# Patient Record
Sex: Female | Born: 1985 | Race: White | Hispanic: No | Marital: Married | Smoking: Former smoker
Health system: Southern US, Community
[De-identification: ages and names within clinical notes are randomized; demographics above are authoritative.]

## PROBLEM LIST (undated history)

## (undated) ENCOUNTER — Inpatient Hospital Stay (HOSPITAL_COMMUNITY): Payer: BLUE CROSS/BLUE SHIELD

## (undated) DIAGNOSIS — Z5189 Encounter for other specified aftercare: Secondary | ICD-10-CM

## (undated) DIAGNOSIS — R112 Nausea with vomiting, unspecified: Secondary | ICD-10-CM

## (undated) DIAGNOSIS — E611 Iron deficiency: Secondary | ICD-10-CM

## (undated) DIAGNOSIS — D509 Iron deficiency anemia, unspecified: Secondary | ICD-10-CM

## (undated) DIAGNOSIS — O09299 Supervision of pregnancy with other poor reproductive or obstetric history, unspecified trimester: Secondary | ICD-10-CM

## (undated) DIAGNOSIS — E039 Hypothyroidism, unspecified: Secondary | ICD-10-CM

## (undated) DIAGNOSIS — B789 Strongyloidiasis, unspecified: Secondary | ICD-10-CM

## (undated) DIAGNOSIS — I4892 Unspecified atrial flutter: Secondary | ICD-10-CM

## (undated) DIAGNOSIS — F419 Anxiety disorder, unspecified: Secondary | ICD-10-CM

## (undated) DIAGNOSIS — R519 Headache, unspecified: Secondary | ICD-10-CM

## (undated) DIAGNOSIS — E669 Obesity, unspecified: Secondary | ICD-10-CM

## (undated) DIAGNOSIS — O24419 Gestational diabetes mellitus in pregnancy, unspecified control: Secondary | ICD-10-CM

## (undated) DIAGNOSIS — E538 Deficiency of other specified B group vitamins: Secondary | ICD-10-CM

## (undated) DIAGNOSIS — I499 Cardiac arrhythmia, unspecified: Secondary | ICD-10-CM

## (undated) DIAGNOSIS — Z9889 Other specified postprocedural states: Secondary | ICD-10-CM

## (undated) HISTORY — DX: Deficiency of other specified B group vitamins: E53.8

## (undated) HISTORY — DX: Iron deficiency: E61.1

## (undated) HISTORY — DX: Obesity, unspecified: E66.9

## (undated) HISTORY — DX: Strongyloidiasis, unspecified: B78.9

## (undated) HISTORY — DX: Iron deficiency anemia, unspecified: D50.9

## (undated) HISTORY — DX: Unspecified atrial flutter: I48.92

## (undated) HISTORY — PX: CARDIAC ELECTROPHYSIOLOGY MAPPING AND ABLATION: SHX1292

## (undated) HISTORY — PX: OTHER SURGICAL HISTORY: SHX169

## (undated) HISTORY — DX: Anxiety disorder, unspecified: F41.9

---
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
## 2004-01-04 ENCOUNTER — Ambulatory Visit (HOSPITAL_COMMUNITY): Admission: RE | Admit: 2004-01-04 | Discharge: 2004-01-05 | Payer: Self-pay | Admitting: Internal Medicine

## 2004-01-13 ENCOUNTER — Ambulatory Visit: Admission: RE | Admit: 2004-01-13 | Discharge: 2004-01-13 | Payer: Self-pay | Admitting: Internal Medicine

## 2004-03-14 ENCOUNTER — Ambulatory Visit (HOSPITAL_COMMUNITY): Admission: RE | Admit: 2004-03-14 | Discharge: 2004-03-14 | Payer: Self-pay | Admitting: Family Medicine

## 2004-03-28 ENCOUNTER — Emergency Department (HOSPITAL_COMMUNITY): Admission: EM | Admit: 2004-03-28 | Discharge: 2004-03-28 | Payer: Self-pay | Admitting: Emergency Medicine

## 2004-08-07 ENCOUNTER — Ambulatory Visit (HOSPITAL_COMMUNITY): Admission: RE | Admit: 2004-08-07 | Discharge: 2004-08-07 | Payer: Self-pay | Admitting: Family Medicine

## 2004-08-20 ENCOUNTER — Ambulatory Visit: Payer: Self-pay | Admitting: Psychiatry

## 2004-09-03 ENCOUNTER — Emergency Department (HOSPITAL_COMMUNITY): Admission: EM | Admit: 2004-09-03 | Discharge: 2004-09-03 | Payer: Self-pay | Admitting: Emergency Medicine

## 2004-09-04 ENCOUNTER — Ambulatory Visit: Payer: Self-pay | Admitting: Family Medicine

## 2004-10-06 ENCOUNTER — Ambulatory Visit: Payer: Self-pay | Admitting: Family Medicine

## 2004-10-10 ENCOUNTER — Emergency Department (HOSPITAL_COMMUNITY): Admission: EM | Admit: 2004-10-10 | Discharge: 2004-10-10 | Payer: Self-pay | Admitting: Emergency Medicine

## 2004-10-15 ENCOUNTER — Emergency Department (HOSPITAL_COMMUNITY): Admission: EM | Admit: 2004-10-15 | Discharge: 2004-10-15 | Payer: Self-pay | Admitting: Emergency Medicine

## 2004-10-24 ENCOUNTER — Ambulatory Visit: Payer: Self-pay | Admitting: Family Medicine

## 2004-10-31 ENCOUNTER — Ambulatory Visit: Payer: Self-pay | Admitting: Family Medicine

## 2004-11-02 HISTORY — PX: GASTRIC BYPASS: SHX52

## 2004-11-05 ENCOUNTER — Ambulatory Visit: Payer: Self-pay | Admitting: Family Medicine

## 2004-11-17 ENCOUNTER — Ambulatory Visit: Payer: Self-pay | Admitting: Family Medicine

## 2004-12-04 ENCOUNTER — Ambulatory Visit: Payer: Self-pay | Admitting: Orthopedic Surgery

## 2005-01-14 ENCOUNTER — Ambulatory Visit: Payer: Self-pay | Admitting: Family Medicine

## 2005-01-21 ENCOUNTER — Emergency Department (HOSPITAL_COMMUNITY): Admission: EM | Admit: 2005-01-21 | Discharge: 2005-01-21 | Payer: Self-pay | Admitting: Emergency Medicine

## 2005-01-22 ENCOUNTER — Ambulatory Visit: Payer: Self-pay | Admitting: Family Medicine

## 2005-01-30 ENCOUNTER — Ambulatory Visit (HOSPITAL_COMMUNITY): Admission: RE | Admit: 2005-01-30 | Discharge: 2005-01-30 | Payer: Self-pay | Admitting: Family Medicine

## 2005-02-03 ENCOUNTER — Ambulatory Visit (HOSPITAL_COMMUNITY): Admission: RE | Admit: 2005-02-03 | Discharge: 2005-02-03 | Payer: Self-pay | Admitting: Family Medicine

## 2005-02-12 ENCOUNTER — Ambulatory Visit: Payer: Self-pay | Admitting: Family Medicine

## 2005-02-17 ENCOUNTER — Ambulatory Visit: Payer: Self-pay | Admitting: *Deleted

## 2005-02-18 ENCOUNTER — Emergency Department (HOSPITAL_COMMUNITY): Admission: EM | Admit: 2005-02-18 | Discharge: 2005-02-18 | Payer: Self-pay | Admitting: Emergency Medicine

## 2005-02-19 ENCOUNTER — Ambulatory Visit: Payer: Self-pay | Admitting: Internal Medicine

## 2005-02-19 ENCOUNTER — Ambulatory Visit (HOSPITAL_COMMUNITY): Admission: RE | Admit: 2005-02-19 | Discharge: 2005-02-19 | Payer: Self-pay

## 2005-02-25 ENCOUNTER — Ambulatory Visit (HOSPITAL_COMMUNITY): Admission: RE | Admit: 2005-02-25 | Discharge: 2005-02-25 | Payer: Self-pay | Admitting: Internal Medicine

## 2005-02-27 ENCOUNTER — Ambulatory Visit (HOSPITAL_COMMUNITY): Admission: RE | Admit: 2005-02-27 | Discharge: 2005-02-27 | Payer: Self-pay | Admitting: Internal Medicine

## 2005-03-04 ENCOUNTER — Ambulatory Visit: Payer: Self-pay | Admitting: Family Medicine

## 2005-03-05 ENCOUNTER — Encounter: Payer: Self-pay | Admitting: Family Medicine

## 2005-03-05 LAB — CONVERTED CEMR LAB: Pap Smear: NORMAL

## 2005-03-10 ENCOUNTER — Ambulatory Visit: Admission: RE | Admit: 2005-03-10 | Discharge: 2005-03-10 | Payer: Self-pay | Admitting: Family Medicine

## 2005-03-18 ENCOUNTER — Ambulatory Visit: Payer: Self-pay | Admitting: Family Medicine

## 2005-03-20 ENCOUNTER — Ambulatory Visit: Payer: Self-pay | Admitting: Pulmonary Disease

## 2005-03-31 ENCOUNTER — Ambulatory Visit: Payer: Self-pay | Admitting: Internal Medicine

## 2005-04-15 ENCOUNTER — Ambulatory Visit: Payer: Self-pay | Admitting: Internal Medicine

## 2005-04-27 ENCOUNTER — Ambulatory Visit (HOSPITAL_COMMUNITY): Admission: RE | Admit: 2005-04-27 | Discharge: 2005-04-27 | Payer: Self-pay | Admitting: Internal Medicine

## 2005-04-29 ENCOUNTER — Ambulatory Visit: Payer: Self-pay | Admitting: Family Medicine

## 2005-05-25 ENCOUNTER — Ambulatory Visit: Payer: Self-pay | Admitting: Family Medicine

## 2005-07-03 ENCOUNTER — Ambulatory Visit: Payer: Self-pay | Admitting: Family Medicine

## 2005-08-04 ENCOUNTER — Ambulatory Visit: Payer: Self-pay | Admitting: Family Medicine

## 2005-08-14 ENCOUNTER — Ambulatory Visit: Payer: Self-pay | Admitting: Family Medicine

## 2005-10-20 ENCOUNTER — Ambulatory Visit: Payer: Self-pay | Admitting: Family Medicine

## 2005-10-30 ENCOUNTER — Ambulatory Visit: Payer: Self-pay | Admitting: Family Medicine

## 2005-10-30 ENCOUNTER — Emergency Department (HOSPITAL_COMMUNITY): Admission: EM | Admit: 2005-10-30 | Discharge: 2005-10-30 | Payer: Self-pay | Admitting: Emergency Medicine

## 2005-11-06 ENCOUNTER — Encounter (HOSPITAL_COMMUNITY): Admission: RE | Admit: 2005-11-06 | Discharge: 2005-12-06 | Payer: Self-pay | Admitting: Family Medicine

## 2005-11-06 ENCOUNTER — Ambulatory Visit (HOSPITAL_COMMUNITY): Payer: Self-pay | Admitting: Family Medicine

## 2005-11-06 ENCOUNTER — Ambulatory Visit: Payer: Self-pay | Admitting: Family Medicine

## 2005-11-09 ENCOUNTER — Ambulatory Visit: Payer: Self-pay | Admitting: Family Medicine

## 2005-12-21 ENCOUNTER — Ambulatory Visit: Payer: Self-pay | Admitting: Family Medicine

## 2006-05-21 ENCOUNTER — Ambulatory Visit: Payer: Self-pay | Admitting: Family Medicine

## 2006-05-21 ENCOUNTER — Ambulatory Visit (HOSPITAL_COMMUNITY): Admission: RE | Admit: 2006-05-21 | Discharge: 2006-05-21 | Payer: Self-pay | Admitting: Family Medicine

## 2006-06-26 ENCOUNTER — Ambulatory Visit: Payer: Self-pay | Admitting: Cardiology

## 2006-06-26 ENCOUNTER — Emergency Department (HOSPITAL_COMMUNITY): Admission: EM | Admit: 2006-06-26 | Discharge: 2006-06-26 | Payer: Self-pay | Admitting: Emergency Medicine

## 2006-10-07 ENCOUNTER — Ambulatory Visit: Payer: Self-pay | Admitting: Family Medicine

## 2006-12-08 ENCOUNTER — Ambulatory Visit: Payer: Self-pay | Admitting: Family Medicine

## 2006-12-13 ENCOUNTER — Ambulatory Visit: Payer: Self-pay | Admitting: Family Medicine

## 2006-12-16 ENCOUNTER — Ambulatory Visit (HOSPITAL_COMMUNITY): Admission: RE | Admit: 2006-12-16 | Discharge: 2006-12-16 | Payer: Self-pay | Admitting: Family Medicine

## 2006-12-17 ENCOUNTER — Encounter: Payer: Self-pay | Admitting: Family Medicine

## 2006-12-17 LAB — CONVERTED CEMR LAB
Bilirubin Urine: NEGATIVE
Hemoglobin, Urine: NEGATIVE
Ketones, ur: NEGATIVE mg/dL
Leukocytes, UA: NEGATIVE
Nitrite: NEGATIVE
Protein, ur: NEGATIVE mg/dL
Specific Gravity, Urine: 1.015 (ref 1.005–1.03)
Urine Glucose: NEGATIVE mg/dL
Urobilinogen, UA: 0.2 (ref 0.0–1.0)
pH: 7.5 (ref 5.0–8.0)

## 2007-04-08 ENCOUNTER — Ambulatory Visit: Payer: Self-pay | Admitting: Family Medicine

## 2007-11-03 ENCOUNTER — Encounter: Payer: Self-pay | Admitting: Family Medicine

## 2008-01-09 ENCOUNTER — Encounter: Payer: Self-pay | Admitting: Family Medicine

## 2008-01-09 DIAGNOSIS — F411 Generalized anxiety disorder: Secondary | ICD-10-CM | POA: Insufficient documentation

## 2008-01-09 DIAGNOSIS — J45909 Unspecified asthma, uncomplicated: Secondary | ICD-10-CM | POA: Insufficient documentation

## 2008-01-09 DIAGNOSIS — J4520 Mild intermittent asthma, uncomplicated: Secondary | ICD-10-CM | POA: Insufficient documentation

## 2008-01-12 ENCOUNTER — Ambulatory Visit: Payer: Self-pay | Admitting: Family Medicine

## 2008-01-19 ENCOUNTER — Encounter: Payer: Self-pay | Admitting: Family Medicine

## 2008-01-19 LAB — CONVERTED CEMR LAB: Retic Ct Pct: 0.9 % (ref 0.4–3.1)

## 2008-01-23 ENCOUNTER — Ambulatory Visit: Payer: Self-pay | Admitting: Family Medicine

## 2008-02-15 ENCOUNTER — Ambulatory Visit: Payer: Self-pay | Admitting: Family Medicine

## 2008-04-13 ENCOUNTER — Encounter (HOSPITAL_COMMUNITY): Admission: RE | Admit: 2008-04-13 | Discharge: 2008-05-13 | Payer: Self-pay | Admitting: Oncology

## 2008-04-13 ENCOUNTER — Ambulatory Visit (HOSPITAL_COMMUNITY): Payer: Self-pay | Admitting: Oncology

## 2008-05-01 ENCOUNTER — Encounter: Payer: Self-pay | Admitting: Family Medicine

## 2008-05-01 ENCOUNTER — Ambulatory Visit: Payer: Self-pay | Admitting: Internal Medicine

## 2008-05-01 ENCOUNTER — Ambulatory Visit: Payer: Self-pay

## 2008-06-18 ENCOUNTER — Encounter (HOSPITAL_COMMUNITY): Admission: RE | Admit: 2008-06-18 | Discharge: 2008-07-18 | Payer: Self-pay | Admitting: Oncology

## 2008-06-19 ENCOUNTER — Ambulatory Visit (HOSPITAL_COMMUNITY): Payer: Self-pay | Admitting: Oncology

## 2008-06-21 ENCOUNTER — Telehealth: Payer: Self-pay | Admitting: Family Medicine

## 2008-06-25 ENCOUNTER — Ambulatory Visit: Payer: Self-pay | Admitting: Family Medicine

## 2008-06-27 ENCOUNTER — Ambulatory Visit: Payer: Self-pay | Admitting: Internal Medicine

## 2008-06-27 ENCOUNTER — Encounter: Payer: Self-pay | Admitting: Family Medicine

## 2008-07-05 ENCOUNTER — Telehealth: Payer: Self-pay | Admitting: Family Medicine

## 2008-07-05 ENCOUNTER — Encounter: Payer: Self-pay | Admitting: Family Medicine

## 2008-08-08 ENCOUNTER — Encounter: Payer: Self-pay | Admitting: Family Medicine

## 2008-08-08 ENCOUNTER — Ambulatory Visit (HOSPITAL_COMMUNITY): Payer: Self-pay | Admitting: Oncology

## 2008-08-15 ENCOUNTER — Telehealth: Payer: Self-pay | Admitting: Family Medicine

## 2008-10-30 ENCOUNTER — Ambulatory Visit (HOSPITAL_COMMUNITY): Payer: Self-pay | Admitting: Oncology

## 2008-10-30 ENCOUNTER — Encounter (HOSPITAL_COMMUNITY): Admission: RE | Admit: 2008-10-30 | Discharge: 2008-11-29 | Payer: Self-pay | Admitting: Oncology

## 2009-04-29 ENCOUNTER — Encounter (HOSPITAL_COMMUNITY): Admission: RE | Admit: 2009-04-29 | Discharge: 2009-05-29 | Payer: Self-pay | Admitting: Oncology

## 2009-04-29 ENCOUNTER — Ambulatory Visit (HOSPITAL_COMMUNITY): Payer: Self-pay | Admitting: Oncology

## 2009-07-09 ENCOUNTER — Ambulatory Visit: Payer: Self-pay | Admitting: Family Medicine

## 2009-07-12 ENCOUNTER — Ambulatory Visit: Payer: Self-pay | Admitting: Family Medicine

## 2009-07-12 DIAGNOSIS — F3189 Other bipolar disorder: Secondary | ICD-10-CM | POA: Insufficient documentation

## 2009-07-15 ENCOUNTER — Encounter: Payer: Self-pay | Admitting: Family Medicine

## 2009-07-15 DIAGNOSIS — R002 Palpitations: Secondary | ICD-10-CM | POA: Insufficient documentation

## 2009-07-15 DIAGNOSIS — G44009 Cluster headache syndrome, unspecified, not intractable: Secondary | ICD-10-CM | POA: Insufficient documentation

## 2009-08-26 DIAGNOSIS — I4892 Unspecified atrial flutter: Secondary | ICD-10-CM | POA: Insufficient documentation

## 2009-09-03 ENCOUNTER — Encounter: Payer: Self-pay | Admitting: Family Medicine

## 2009-09-10 ENCOUNTER — Encounter (INDEPENDENT_AMBULATORY_CARE_PROVIDER_SITE_OTHER): Payer: Self-pay | Admitting: *Deleted

## 2009-10-30 ENCOUNTER — Encounter (HOSPITAL_COMMUNITY)
Admission: RE | Admit: 2009-10-30 | Discharge: 2009-11-29 | Payer: Self-pay | Source: Home / Self Care | Admitting: Oncology

## 2009-10-30 ENCOUNTER — Ambulatory Visit (HOSPITAL_COMMUNITY): Payer: Self-pay | Admitting: Oncology

## 2010-07-25 ENCOUNTER — Encounter: Payer: Self-pay | Admitting: Family Medicine

## 2010-10-02 ENCOUNTER — Ambulatory Visit: Payer: Self-pay | Admitting: Family Medicine

## 2010-10-02 DIAGNOSIS — D509 Iron deficiency anemia, unspecified: Secondary | ICD-10-CM

## 2010-10-02 DIAGNOSIS — M2550 Pain in unspecified joint: Secondary | ICD-10-CM | POA: Insufficient documentation

## 2010-10-02 DIAGNOSIS — H6690 Otitis media, unspecified, unspecified ear: Secondary | ICD-10-CM | POA: Insufficient documentation

## 2010-10-02 HISTORY — DX: Iron deficiency anemia, unspecified: D50.9

## 2010-10-03 ENCOUNTER — Telehealth (INDEPENDENT_AMBULATORY_CARE_PROVIDER_SITE_OTHER): Payer: Self-pay | Admitting: *Deleted

## 2010-10-03 ENCOUNTER — Encounter: Payer: Self-pay | Admitting: Family Medicine

## 2010-10-05 DIAGNOSIS — F17200 Nicotine dependence, unspecified, uncomplicated: Secondary | ICD-10-CM | POA: Insufficient documentation

## 2010-10-07 ENCOUNTER — Telehealth: Payer: Self-pay | Admitting: Family Medicine

## 2010-10-07 LAB — CONVERTED CEMR LAB: Retic Ct Pct: 0.7 % (ref 0.4–3.1)

## 2010-10-22 ENCOUNTER — Encounter: Payer: Self-pay | Admitting: Family Medicine

## 2010-10-28 ENCOUNTER — Encounter: Payer: Self-pay | Admitting: Internal Medicine

## 2010-10-28 ENCOUNTER — Encounter (HOSPITAL_COMMUNITY)
Admission: RE | Admit: 2010-10-28 | Discharge: 2010-11-27 | Payer: Self-pay | Source: Home / Self Care | Attending: Oncology | Admitting: Oncology

## 2010-10-28 ENCOUNTER — Ambulatory Visit (HOSPITAL_COMMUNITY): Payer: Self-pay | Admitting: Oncology

## 2010-10-28 ENCOUNTER — Encounter: Payer: Self-pay | Admitting: Family Medicine

## 2010-11-23 ENCOUNTER — Encounter: Payer: Self-pay | Admitting: Family Medicine

## 2010-12-02 NOTE — Progress Notes (Signed)
Summary: cough medicine  Phone Note Call from Patient   Summary of Call: she [facility] [address] [place]  phone # [phone] Initial call taken by: Lind Guest,  October 07, 2010 4:56 PM  Follow-up for Phone Call        tussionesx is printed, pls fax to the requested pharmacy and let her know Follow-up by: [name] MD,  October 08, 2010 5:07 PM  Additional Follow-up for Phone Call Additional follow up Details #1::        med telephoned in  Additional Follow-up by: Adella Hare LPN,  October 08, 2010 5:22 PM    New/Updated Medications: Sandria Senter ER 10-8 MG/5ML LQCR (HYDROCOD POLST-CHLORPHEN POLST) one teaspoon twice daily as needed for excessive cough Prescriptions: TUSSIONEX PENNKINETIC ER 10-8 MG/5ML LQCR (HYDROCOD POLST-CHLORPHEN POLST) one teaspoon twice daily as needed for excessive cough  #247ml x 0   Entered and Authorized by:   [name] MD   Signed by:   [name] MD on 10/08/2010   Method used:   Printed then faxed to ...       [facility] (retail)       [address]       [place], [place]  [address]       Ph: [phone]       Fax: [phone]   RxID:   [id number]

## 2010-12-02 NOTE — Progress Notes (Signed)
Summary: physical  Phone Note Outgoing Call   Summary of Call: pt needs complete physical and a ppd test as soon as possible. we have no appts for physical until end of oct. are either refer her to another obgyn. [phone] Initial call taken by: Rudene Anda,  June 21, 2008 3:22 PM  Follow-up for Phone Call        [facility] appt. for next week pls   pt was notified and has appt aug 25 for just ppd. She  had physical in april. Follow-up by: [name] MD,  June 22, 2008 5:59 AM  Additional Follow-up for Phone Call Additional follow up Details #1::        Pt has appt Additional Follow-up by: Rudene Anda,  June 22, 2008 3:13 PM

## 2010-12-02 NOTE — Progress Notes (Signed)
Summary: need to refer to Dr Lurene Shadow  Phone Note Outgoing Call   Call placed by: shannon  Call placed to: Patient Summary of Call: I called patient to see what dates might work for her to be scheduled with Dr. Lurene Shadow.  Awaiting her call before I can do referral. Initial call taken by: Curtis Sites,  October 03, 2010 9:51 AM  Follow-up for Phone Call        patient is going to see Dr. Kellie Simmering instead of dr. Lurene Shadow.  Patient has an appt. already. Follow-up by: Curtis Sites,  October 07, 2010 2:51 PM

## 2010-12-02 NOTE — Assessment & Plan Note (Signed)
Summary: TB  Nurse Visit  Comments patient in today for ppd placement   Allergies: 1)  ! Morphine 2)  ! Pcn 3)  ! Percocet  Immunizations Administered:  PPD Skin Test:    Vaccine Type: PPD    Site: left forearm    Mfr: Sanofi Pasteur    Dose: 0.1 ml    Route: ID    Given by: Worthy Keeler LPN    Exp. Date: 11/28/2011    Lot #: [id number]  Orders Added: 1)  TB Skin Test [86580] 2)  Admin 1st Vaccine [90471]  Appended Document: TB   PPD Results    Date of reading: 07/11/2009    Results: < 5mm    Interpretation: negative

## 2010-12-02 NOTE — Progress Notes (Signed)
Summary: NEEDS RX WRITTEN OUT  Phone Note From Other Clinic   Summary of Call: NEEDS HER RX WRITTEN FOR WHEN SHE GOES TO [place] LEAVING THIS SATURDAY MAXALT  10 MG TOPMAX 75 MG two times a day  ALSO PLEASE PUT REFILLS SHE WILL BE GONE [place]  CALL BACK [phone] Initial call taken by: Lind Guest,  August 15, 2008 11:19 AM Summary of Call: Rx written and dose to be verified by nursing, pt to collect. Initial call taken by: [name] MD,  August 15, 2008 3:10 PM  Follow-up for Phone Call        Phone call completed Follow-up by: Worthy Keeler LPN,  August 15, 2008 3:14 PM    New/Updated Medications: TOPAMAX 25 MG TABS (TOPIRAMATE) THREE TABS by mouth BID MAXALT 10 MG TABS (RIZATRIPTAN BENZOATE) UAD   Prescriptions: MAXALT 10 MG TABS (RIZATRIPTAN BENZOATE) UAD  #10 x 5   Entered by:   Worthy Keeler LPN   Authorized by:   [name] MD   Signed by:   Worthy Keeler LPN on 45/40/9811   Method used:   Handwritten   RxID:   [id number] TOPAMAX 25 MG TABS (TOPIRAMATE) THREE TABS by mouth BID  #180 x 5   Entered by:   Worthy Keeler LPN   Authorized by:   [name] MD   Signed by:   Worthy Keeler LPN on 78/46/9629   Method used:   Handwritten   RxID:   [id number]

## 2010-12-02 NOTE — Assessment & Plan Note (Signed)
Summary: ppd shot  Nurse Visit    Prior Medications: FUROSEMIDE 20 MG  TABS (FUROSEMIDE) one tab by mouth once daily FIORICET 50-325-40 MG  TABS (BUTALBITAL-APAP-CAFFEINE) one tab by mouth every 4 -6 hours as needed PROPRANOLOL HCL 20 MG  TABS (PROPRANOLOL HCL) one tab by mouth two times a day LEXAPRO 20 MG  TABS (ESCITALOPRAM OXALATE) two tabs by mouth once daily CLONAZEPAM 2 MG  TABS (CLONAZEPAM) 1/2  or one tab by mouth every 8 hours as needed RESTORIL 7.5 MG  CAPS (TEMAZEPAM) one tab by mouth at bedtime as needed Current Allergies: ! MORPHINE ! PCN ! PERCOCET   PPD Application    Vaccine Type: PPD    Site: left forearm    Mfr: Sanofi Pasteur    Dose: 0.1 ml    Route: ID    Given by: Anna Wall    Exp. Date: 09/21/2010    Lot #: [id number]  PPD Results    Date of reading: 06/28/2008    Results: < 5mm    Interpretation: negative   Orders Added: 1)  TB Skin Test Maurine.Goes    ]

## 2010-12-02 NOTE — Letter (Signed)
Summary: HEALTH STATEMENT RELEASE  HEALTH STATEMENT RELEASE   Imported By: Lind Guest 07/15/2009 15:01:27  _____________________________________________________________________  External Attachment:    Type:   Image     Comment:   External Document

## 2010-12-02 NOTE — Letter (Signed)
Summary: Appointment - Missed  [facility], Main Office  [address]   [place], [place] [address]   Phone: [phone]  Fax: [phone]        September 10, 2009 MRN: [id number]   [facility] HOLT [address] [place], [place]  [address]   Dear Ms. HOLT,  Our records indicate you missed your appointment on  August 28, 2009 with Dr. Ladona Ridgel.  It is very important that we reach you to reschedule this appointment. We look forward to participating in your health care needs.   Please contact us at the number listed above at your earliest convenience to reschedule this appointment.  Sincerely,   Burnard Leigh [facility] [employer] Scheduling Team

## 2010-12-02 NOTE — Assessment & Plan Note (Signed)
Summary: PHY   Vital Signs:  Patient profile:   25 year old female Menstrual status:  irregular Height:      61.5 inches Weight:      169 pounds BMI:     31.53 O2 Sat:      98 % Pulse rate:   91 / minute Pulse (ortho):   72 / minute Pulse rhythm:   regular Resp:     16 per minute BP sitting:   120 / 80  (right arm)  Vitals Entered By: Everitt Amber (July 12, 2009 10:30 AM)  Nutrition Counseling: Patient's BMI is greater than 25 and therefore counseled on weight management options. CC: CPE Is Patient Diabetic? No Pain Assessment Patient in pain? no       Vision Screening:Left eye w/o correction: 20 / 15 Right Eye w/o correction: 20 / 15 Both eyes w/o correction:  20/ 15  Color vision testing: normal      Vision Entered By: Everitt Amber (July 12, 2009 10:38 AM)  years   days  Menstrual Status irregular Last PAP Result Normal   CC:  CPE.  History of Present Illness: Pt comes in for f/i. She is working as a Tour manager and has noted a deterioration inher ability to function over the last 4 to 6 months since stoppingher medication for bipolar ds with which she was diagnosed approx 2 yrs ago. She wishes to resume this as wellas realising the need to re-establish with her psychiatrist. She denies excessibe cryting spells, burt has nbeen feeling somewhat depressed and down. She reports some irritability and feeling overwhelmed also. She has had no recent fever or chills. She denies any current head or chest congestion, but she did recentlyexperiencean episode of wheezing which was the first in 2 years. She has had no episodes of tachycardia, and denieschest discomfort, she does follow with her cardiologist annualy. She continues to require iV iron, and states that a hysterectomy was mentioned by her hematologist which she is not at all interested in at this time. she is bothered by an excess of skin where she had lost weight , but the cost of surgery is  prohibitive at this time.  Allergies: 1)  ! Morphine 2)  ! Pcn 3)  ! Percocet  Review of Systems      See HPI General:  Denies chills, fatigue, fever, sleep disorder, sweats, and weakness. Eyes:  Denies blurring and discharge. ENT:  Denies hoarseness, nasal congestion, sinus pressure, and sore throat. CV:  Saw Dr. Ladona Ridgel about 1 year ago wore a holter for 1 month told no new disease process, cotinue toprol and exercise. Resp:  Complains of wheezing; denies cough, pleuritic, shortness of breath, and sputum productive; 2 episodes ofwheezing about 3 weeks ago when in [place]. GI:  Denies abdominal pain, constipation, diarrhea, nausea, and vomiting. GU:  Complains of abnormal vaginal bleeding; heavy menses persist which are irreguklar, still needing iv iron has had 18 in the past yr, taking oTC supplement called "blood builder", three times a day, iron absorption is minimal, last saw Heme 2 months ago she is also on B121000 mcg monthly. MS:  Denies joint pain and stiffness. Derm:  c/o excessive skin hanging over reportedly approx 35 pounds, pt had gastric bypass andhas been told that the surgery to have thisreoved would cost approx $40K. Neuro:  Complains of headaches; constant daily and uncontroled migraines, eats Bc. last neurology 3 yrs ago. Psych:  Complains of easily angered and easily tearful; denies suicidal thoughts/plans  and thoughts of violence; Diagnosed formally, as bipolar disease approx 2 yrs ago, off med for 6 months slight decompensation. Endo:  Denies cold intolerance, excessive hunger, excessive thirst, excessive urination, and polyuria. Heme:  Complains of bleeding; denies abnormal bruising; heavy and irreg menses with persistent profound iron deficiency. Allergy:  Complains of seasonal allergies; denies hives or rash and itching eyes.  Physical Exam  General:  alert, well-nourished, and well-hydrated. HEENT: No facial asymmetry,  EOMI, No sinus tenderness, TM's Clear,  oropharynx  pink and moist.   Chest: Clear to auscultation bilaterally.  CVS: S1, S2, No murmurs, No S3.   Abd: Soft, Nontender.  MS: Adequate ROM spine, hips, shoulders and knees.  Ext: No edema.   CNS: CN 2-12 intact, power tone and sensation normal throughout.   Skin: Intact, no visible lesions or rashes.  Psych: Good eye contact, normal affect.  Memory intact, not anxious or depressed appearing.     Impression & Recommendations:  Problem # 1:  BIPOLAR II DISORDER (ICD-296.89) Assessment Deteriorated  resume lamictal 200mg  daily   Orders: Psychiatric Referral (Psych)  Problem # 2:  ASTHMA, UNSPECIFIED, UNSPECIFIED STATUS (ICD-493.90) Assessment: Deteriorated  Her updated medication list for this problem includes:    Proventil Hfa 108 (90 Base) Mcg/act Aers (Albuterol sulfate) .Marland Kitchen... 2 puffs every 6 to 8hours as needed for wheezing  Problem # 3:  OBESITY, UNSPECIFIED (ICD-278.00) Assessment: Unchanged  Orders: T-Lipid Profile ([id number])  Ht: 61.5 (07/12/2009)   Wt: 169 (07/12/2009)   BMI: 31.53 (07/12/2009)  Problem # 4:  PALPITATIONS (ICD-785.1) Assessment: Unchanged  The following medications were removed from the medication list:    Propranolol Hcl 20 Mg Tabs (Propranolol hcl) ..... One tab by mouth two times a day Her updated medication list for this problem includes:    Toprol Xl 25 Mg Xr24h-tab (Metoprolol succinate) .Marland Kitchen... 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 Take 1 tablet by mouth two times a day 2)  Lamotrigine 200 Mg Tabs (Lamotrigine) .... Take 1 tablet by mouth once a day 3)  Toprol Xl 25 Mg Xr24h-tab (Metoprolol succinate) .... Take 1 tablet by mouth  once a day 4)  Maxalt 10 Mg Tabs (Rizatriptan benzoate) .Marland Kitchen.. 1 tablet at headache onset, rept after 2 hrs if needed , up to 3 tablets in 24 hrs, max use twice weekly 5)  Proventil Hfa 108 (90 Base) Mcg/act Aers (Albuterol sulfate) .... 2 puffs every 6 to 8hours as needed for wheezing  Other Orders: T-Basic Metabolic Panel [id number]) T-CBC w/Diff [phone]) T-TSH [phone])  Patient Instructions: 1)  F/U in 5.5 months. 2)  It is important that you exercise regularly at least 20 minutes 5 times a week. If you develop chest pain, have severe difficulty breathing, or feel very tired , stop exercising immediately and seek medical attention. 3)  You need to lose weight. Consider a lower calorie diet and regular exercise.  4)  BMP prior to visit, ICD-9: 5)  Lipid Panel prior to visit, ICD-9:  fasting labs asap. 6)  TSH prior to visit, ICD-9: 7)  CBC w/ Diff prior to visit, ICD-9: 8)  pLs ensure that you keep appts with neurologist, psychiatrist, cardiologist, gynaecologist. Prescriptions: PROVENTIL HFA 108 (90  BASE) MCG/ACT AERS (ALBUTEROL SULFATE) 2 puffs every 6 to 8hours as needed for wheezing  #1 x 3   Entered and Authorized by:   [name] MD   Signed by:   [name] MD on 07/12/2009   Method used:   Electronically to        [facility] (retail)       [address]       [place], [place]  [address]       Ph: [phone]       Fax: [phone]   RxID:   [id number] MAXALT 10 MG TABS (RIZATRIPTAN BENZOATE) 1 tablet at headache onset, rept after 2 hrs if needed , up to 3 tablets in 24 hrs, max use twice weekly  #10 x 5   Entered and Authorized by:   [name] MD   Signed by:   [name] MD on 07/12/2009   Method used:   Electronically to        [facility] (retail)       [address]       [place], [place]  [address]       Ph: [phone]       Fax: [phone]    RxID:   [id number] TOPROL XL 25 MG XR24H-TAB (METOPROLOL SUCCINATE) Take 1 tablet by mouth once a day  #30 x 5   Entered and Authorized by:   [name] MD   Signed by:   [name] MD on 07/12/2009   Method used:   Electronically to        [facility] (retail)       [address]       [place], [place]  [address]       Ph: [phone]       Fax: [phone]   RxID:   [id number] LAMOTRIGINE 200 MG TABS (LAMOTRIGINE) Take 1 tablet by mouth once a day  #30 x 5   Entered and Authorized by:   [name] MD   Signed by:   [name] MD on 07/12/2009   Method used:   Electronically to        [facility] (retail)       [address]       [place], [place]  [address]       Ph: [phone]       Fax: [phone]   RxID:   [id number] TOPAMAX 100 MG TABS (TOPIRAMATE) Take 1 tablet by mouth two times a day  #60 x 5   Entered and Authorized by:   [name] MD   Signed by:   [name] MD on 07/12/2009   Method used:   Electronically to        [facility] (retail)       [address]       [place], [place]  [address]       Ph: [phone]       Fax: [phone]   RxID:   [id number]

## 2010-12-02 NOTE — Letter (Signed)
Summary: external records  external records   Imported By: Curtis Sites 06/24/2010 08:37:48  _____________________________________________________________________  External Attachment:    Type:   Image     Comment:   External Document

## 2010-12-02 NOTE — Letter (Signed)
Summary: RELEASE OF MEDICAL INFO  RELEASE OF MEDICAL INFO   Imported By: Lind Guest 07/25/2010 09:42:13  _____________________________________________________________________  External Attachment:    Type:   Image     Comment:   External Document

## 2010-12-02 NOTE — Progress Notes (Signed)
Summary: paper work  Phone Note Call from Patient   Summary of Call: needs to speak with nurse about papers. [phone] Initial call taken by: Rudene Anda,  July 05, 2008 3:18 PM  Follow-up for Phone Call        Phone Call Completed Follow-up by: Worthy Keeler LPN,  July 05, 2008 4:28 PM

## 2010-12-02 NOTE — Assessment & Plan Note (Signed)
Summary: FOLLOW UP/SLJ   Vital Signs:  Patient profile:   25 year old female Menstrual status:  irregular Height:      61.5 inches O2 Sat:      98 % on Room air Pulse rate:   91 / minute Pulse rhythm:   regular Resp:     16 per minute BP sitting:   108 / 68  (left arm)  Vitals Entered By: Adella Hare LPN (October 02, 2010 1:32 PM)  O2 Flow:  Room air CC: follow-up visit Is Patient Diabetic? No Pain Assessment Patient in pain? no        CC:  follow-up visit.  History of Present Illness: C/O joint pains and fatigue, has not had her iron studies done recently, had been on Iv iron in the past. She is concerned about whether or not she has fibromyalgia and wants to see a rheumatologist. She is also requesting re-eval by hematologyfor the need for IV iron. She has a 2 day  h/o left ear pain , and intermittent chills, no documented fever. Reports  that she has not been doing well, she relapsed into alcohol use but is in [place] .  Denies recent fever  Denies sinus pressure, nasal congestion ,  or sore throat. Denies chest congestion, or cough productive of sputum. Denies chest pain,  PND, orthopnea or leg swelling. Denies abdominal pain, nausea, vomitting, diarrhea or constipation. Denies change in bowel movements or bloody stool. Denies dysuria , frequency, incontinence or hesitancy.  Denies headaches, vertigo, seizures.  Denies  rash, lesions, or itch.      Preventive Screening-Counseling & Management  Alcohol-Tobacco     Smoking Cessation Counseling: yes  Current Medications (verified): 1)  Nortriptyline Hcl 25 Mg Caps (Nortriptyline Hcl) .... Two Caps By Mouth At Bedtime 2)  Symlinpen 120 1000 Mcg/ml Soln (Pramlintide Acetate) .... Monthy Subcutaneously Injection 3)  Vitamin B-12 1000 Mcg Tabs (Cyanocobalamin) .... One Tab By Mouth Two Times A Day 4)  Q-Gel Ultra 60 Mg Caps (Coenzyme Q10) .... One Cap By Mouth Once Daily 5)  Biotin 5000 Mcg Caps (Biotin) .... One  Cap By Mouth Once Daily 6)  Icaps  Caps (Multiple Vitamins-Minerals) .... One Cap By Mouth Two Times A Day 7)  Gelatin 650 Mg Caps (Gelatin) .... Two Caps By Mouth Two Times A Day 8)  Collagen Plus Vitamin C 740-125 Mg Caps (Collagen-Vitamin C) .... Three Caps By Mouth Two Times A Day 9)  Trigosamine Max St  Tabs (Glucos-Chondroit-Hyaluron-D3) .... One Tab By Mouth Three Times A Day 10)  Caltrate 600+d 600-400 Mg-Unit Chew (Calcium Carbonate-Vitamin D) .... One Chew Once Daily  Allergies: 1)  ! Morphine 2)  ! Pcn 3)  ! Percocet 4)  ! Cipro  Social History: Occupation: Engineer, civil (consulting) Single Drug use-no Alcohol use-yes, dependent with recent relapse  Review of Systems      See HPI General:  Complains of fatigue. Eyes:  Denies blurring, discharge, double vision, eye pain, and red eye. ENT:  Complains of earache; 2 day left ear pain , no fever or chills , intermittent cough x 2 days, no sputum. MS:  Complains of joint pain and stiffness; generalised joint pains and stiffness worsein the cold. shoulders, left worse than right, both hips, fingers worse in the past 3 to 4 months uses glucosamine and collagen. Neuro:  Complains of headaches; improved , has seen  neurology, on nortirptyline. 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 Endo:  Denies cold intolerance, excessive hunger, excessive thirst, and excessive urination. Heme:  Denies abnormal bruising and bleeding. Allergy:  Complains of seasonal allergies; denies hives or rash and itching eyes.  Physical Exam  General:  Well-developed,well-nourished,in no acute distress; alert,appropriate and cooperative throughout examination HEENT: No facial asymmetry,  EOMI, No sinus tenderness,left TM's red, oropharynx  pink and moist.   Chest: Clear to auscultation bilaterally.  CVS: S1, S2, No murmurs, No  S3.   Abd: Soft, Nontender.  MS: Adequate ROM spine, hips, shoulders and knees. generalised tenderness. Ext: No edema.   CNS: CN 2-12 intact, power tone and sensation normal throughout.   Skin: Intact, no visible lesions or rashes.  Psych: Good eye contact, normal affect.  Memory intact, not anxious or depressed appearing.    Impression & Recommendations:  Problem # 1:  LOM (ICD-382.9) Assessment Comment Only  Her updated medication list for this problem includes:    Septra Ds 800-160 Mg Tabs (Sulfamethoxazole-trimethoprim) .Marland Kitchen... Take 1 tablet by mouth two times a day  Problem # 2:  PAIN IN JOINT, MULTIPLE SITES (ICD-719.49) Assessment: Deteriorated  Orders: Rheumatology Referral (Rheumatology)  Problem # 3:  ANEMIA (ICD-285.9) Assessment: Comment Only  Her updated medication list for this problem includes:    Vitamin B-12 1000 Mcg Tabs (Cyanocobalamin) ..... One tab by mouth two times a day  Orders: T-CBC w/Diff [id number]) T-Anemia Panel 3  (2904) Hematology Referral (Hematology)  Hgb: 10.8 (01/19/2008)   Hct: 35.6 (01/19/2008)   Platelets: 321 (01/19/2008) RBC: 4.11 (01/19/2008)   RDW: 14.3 (01/19/2008)   WBC: 8.9 (01/19/2008) MCV: 86.6 (01/19/2008)   MCHC: 30.3 (01/19/2008) Retic Ct: 37.0 K/uL (01/19/2008)   Ferritin: 6 (01/19/2008) Iron: 44 (01/19/2008)   TIBC: 419 (01/19/2008)   % Sat: 11 (01/19/2008) B12: 251 (01/19/2008)   Folate: 17.1 (01/19/2008)   TSH: 1.478 (01/19/2008)  Problem # 4:  ATRIAL FLUTTER (ICD-427.32) Assessment: Comment Only  The following medications were removed from the medication list:    Toprol Xl 25 Mg Xr24h-tab (Metoprolol succinate) .Marland Kitchen... Take 1 tablet by mouth once a day Her updated medication list for this problem includes:    Metoprolol Succinate 25 Mg Xr24h-tab (Metoprolol succinate) .Marland Kitchen... Take 1 tablet by mouth two times a day followed by card, has had ablation  Problem # 5:  OBESITY, UNSPECIFIED (ICD-278.00) Assessment:  Deteriorated  Ht: 61.5 (10/02/2010)   Wt: 169 (07/12/2009)   BMI: 31.53 (07/12/2009) pt refusing to weigh  Problem # 6:  ANXIETY, CHRONIC (ICD-300.00) Assessment: Unchanged  Her updated medication list for this problem includes:    Nortriptyline Hcl 25 Mg Caps (Nortriptyline hcl) .Marland Kitchen..Marland Kitchen Two caps by mouth at bedtime  Problem # 7:  NICOTINE ADDICTION (ICD-305.1) Assessment: Deteriorated  Encouraged smoking cessation and discussed different methods for smoking cessation.   Complete Medication List: 1)  Nortriptyline Hcl 25 Mg Caps (Nortriptyline hcl) .... Two caps by mouth at bedtime 2)  Symlinpen 120 1000 Mcg/ml Soln (Pramlintide acetate) .... Monthy subcutaneously injection 3)  Vitamin B-12 1000 Mcg Tabs (Cyanocobalamin) .... One tab by mouth two times a day 4)  Q-gel Ultra 60 Mg Caps (Coenzyme q10) .... One cap by mouth once daily 5)  Biotin 5000 Mcg Caps (Biotin) .... One cap by mouth once daily 6)  Icaps Caps (Multiple vitamins-minerals) .... One cap by mouth two times a day 7)  Gelatin 650 Mg Caps (Gelatin) .... Two caps by mouth two  times a day 8)  Collagen Plus Vitamin C 740-125 Mg Caps (Collagen-vitamin c) .... Three caps by mouth two times a day 9)  Trigosamine [employer] (Glucos-chondroit-hyaluron-d3) .... One tab by mouth three times a day 10)  Caltrate 600+d 600-400 Mg-unit Chew (Calcium carbonate-vitamin d) .... One chew once daily 11)  Metoprolol Succinate 25 Mg Xr24h-tab (Metoprolol succinate) .... Take 1 tablet by mouth two times a day 12)  Septra Ds 800-160 Mg Tabs (Sulfamethoxazole-trimethoprim) .... Take 1 tablet by mouth two times a day 13)  Fluconazole 150 Mg Tabs (Fluconazole) .... Take 1 tablet by mouth once a day as needed for vaginal itch  Other Orders: T-Basic Metabolic Panel [id number]) T-TSH [id number]  Patient Instructions: 1)  Please schedule a follow-up appointment in 4.5 to 5  months. 2)  BMP prior to visit, ICD-9: 3)  TSH prior to visit,  ICD-9: 4)  CBC w/ Diff prior to visit, ICD-9:  fasting asap 5)  Anemia panel 6)  Vitamin D 7)  You are being treated for left otitis media  meds  are sent to your pharmacy. 8)  Tobacco is very bad for your health and your loved ones! You Should stop smoking!. 9)  Stop Smoking Tips: Choose a Quit date. Cut down before the Quit date. decide what you will do as a substitute when you feel the urge to smoke(gum,toothpick,exercise). 10)  It is not healthy  for men to drink more than 2-3 drinks per day or for women to drink more than 1-2 drinks per day. Prescriptions: FLUCONAZOLE 150 MG TABS (FLUCONAZOLE) Take 1 tablet by mouth once a day as needed for vaginal itch  #3 x 0   Entered and Authorized by:   [name] MD   Signed by:   [name] MD on 10/02/2010   Method used:   Electronically to        [facility] (retail)       [address]       [place], [place]  [address]       Ph: [phone]       Fax: [phone]   RxID:   [id number] SEPTRA DS 800-160 MG TABS (SULFAMETHOXAZOLE-TRIMETHOPRIM) Take 1 tablet by mouth two times a day  #20 x 0   Entered and Authorized by:   [name] MD   Signed by:   [name] MD on 10/02/2010   Method used:   Electronically to        [facility] (retail)       [address]       [place], [place]  [address]       Ph: [phone]       Fax: [phone]   RxID:   [id number] METOPROLOL SUCCINATE 25 MG XR24H-TAB (METOPROLOL SUCCINATE) Take 1 tablet by mouth two times a day  #60 x 5   Entered and Authorized by:   [name] MD   Signed by:   [name] MD on 10/02/2010   Method used:   Electronically to        [facility] (retail)       [address]       [place], [place]  [address]       Ph: [phone]       Fax: [phone]   RxID:   [id number]  Orders Added: 1)  Est. Patient  Level IV [[address]] 2)  T-Basic Metabolic Panel [[id number]] 3)  T-CBC w/Diff [[id number]] 4)  T-TSH [[address]] 5)  T-Anemia Panel 3  [2904] 6)  Hematology Referral [Hematology] 7)  Rheumatology Referral [Rheumatology]

## 2010-12-02 NOTE — Letter (Signed)
Summary: IMMUNIZATION  IMMUNIZATION   Imported By: Lind Guest 07/10/2008 13:07:08  _____________________________________________________________________  External Attachment:    Type:   Image     Comment:   External Document

## 2010-12-02 NOTE — Progress Notes (Signed)
Summary: [facility]  [facility]   Imported By: Lind Guest 08/28/2008 09:50:48  _____________________________________________________________________  External Attachment:    Type:   Image     Comment:   External Document

## 2010-12-04 ENCOUNTER — Other Ambulatory Visit (HOSPITAL_COMMUNITY): Payer: Self-pay

## 2010-12-04 ENCOUNTER — Encounter (HOSPITAL_COMMUNITY): Admission: RE | Admit: 2010-12-04 | Payer: Self-pay | Source: Home / Self Care | Admitting: Oncology

## 2010-12-04 NOTE — Letter (Signed)
Summary: [facility]  [facility]   Imported By: Lind Guest 11/13/2010 16:01:09  _____________________________________________________________________  External Attachment:    Type:   Image     Comment:   External Document

## 2010-12-04 NOTE — Letter (Signed)
Summary: rheumatology  rheumatology   Imported By: Lind Guest 11/04/2010 15:23:41  _____________________________________________________________________  External Attachment:    Type:   Image     Comment:   External Document

## 2010-12-05 NOTE — Progress Notes (Signed)
Summary: [facility]  [facility]   Imported By: Lind Guest 09/04/2009 11:20:38  _____________________________________________________________________  External Attachment:    Type:   Image     Comment:   External Document

## 2010-12-23 ENCOUNTER — Encounter: Payer: Self-pay | Admitting: Family Medicine

## 2010-12-30 NOTE — Letter (Signed)
Summary: weight loss and wellness  weight loss and wellness   Imported By: Lind Guest 12/25/2010 11:41:04  _____________________________________________________________________  External Attachment:    Type:   Image     Comment:   External Document

## 2011-01-13 NOTE — Letter (Signed)
Summary: [facility]  [facility]   Imported By: Kassie Mends 01/08/2011 09:05:21  _____________________________________________________________________  External Attachment:    Type:   Image     Comment:   External Document

## 2011-02-02 LAB — CBC
HCT: 38 % (ref 36.0–46.0)
Hemoglobin: 12.9 g/dL (ref 12.0–15.0)
MCHC: 34 g/dL (ref 30.0–36.0)
MCV: 92.3 fL (ref 78.0–100.0)
Platelets: 277 10*3/uL (ref 150–400)
RBC: 4.11 MIL/uL (ref 3.87–5.11)
RDW: 12.9 % (ref 11.5–15.5)
WBC: 7.4 10*3/uL (ref 4.0–10.5)

## 2011-02-02 LAB — FERRITIN: Ferritin: 60 ng/mL (ref 10–291)

## 2011-02-09 LAB — CBC
HCT: 34 % — ABNORMAL LOW (ref 36.0–46.0)
Hemoglobin: 11.8 g/dL — ABNORMAL LOW (ref 12.0–15.0)
MCHC: 34.6 g/dL (ref 30.0–36.0)
MCV: 86.9 fL (ref 78.0–100.0)
Platelets: 264 10*3/uL (ref 150–400)
RBC: 3.91 MIL/uL (ref 3.87–5.11)
RDW: 13.6 % (ref 11.5–15.5)
WBC: 6.7 10*3/uL (ref 4.0–10.5)

## 2011-02-09 LAB — DIFFERENTIAL
Basophils Absolute: 0 10*3/uL (ref 0.0–0.1)
Basophils Relative: 0 % (ref 0–1)
Eosinophils Absolute: 0 10*3/uL (ref 0.0–0.7)
Eosinophils Relative: 0 % (ref 0–5)
Lymphocytes Relative: 26 % (ref 12–46)
Lymphs Abs: 1.8 10*3/uL (ref 0.7–4.0)
Monocytes Absolute: 0.4 10*3/uL (ref 0.1–1.0)
Monocytes Relative: 6 % (ref 3–12)
Neutro Abs: 4.5 10*3/uL (ref 1.7–7.7)
Neutrophils Relative %: 67 % (ref 43–77)

## 2011-02-09 LAB — FERRITIN: Ferritin: 8 ng/mL — ABNORMAL LOW (ref 10–291)

## 2011-02-26 ENCOUNTER — Encounter: Payer: Self-pay | Admitting: Family Medicine

## 2011-03-03 ENCOUNTER — Encounter: Payer: Self-pay | Admitting: Family Medicine

## 2011-03-04 ENCOUNTER — Encounter: Payer: Self-pay | Admitting: Family Medicine

## 2011-03-17 NOTE — Assessment & Plan Note (Signed)
[facility]                         ELECTROPHYSIOLOGY OFFICE NOTE   NAME:Barr, Alejandra BLACKLOCK                       MRN:          [id number]  DATE:05/01/2008                            DOB:          1986-09-14    Alejandra Barr is here today after a 4-year absence from EP clinic for  evaluation of palpitations.  The patient has a history of palpitations  and has documented atrial flutter and underwent catheter ablation of  atrial flutter back in 2005.  At that time, she had no additional  arrhythmias that we could induce.  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  The patient notes that in the last  month or two, she would note palpitations which occur at any time though  there may be relationship to exertions not very clear to me.  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  It is unclear  whether they start and stop suddenly but they are not clearly related to  exertion.  She is here today for initial evaluation.  She denies  syncope.  She is considering having some additional plastic surgery done  to remove some of the skin folds, which she has now since she has lost  all the weight.  She also notes that she has been anemic secondary to  her poor absorption of iron after her gastric bypass and is undergoing  evaluation for possible intravenous iron therapy.   MEDICATIONS:  1. Topamax 75 twice a day.  2. Lamictal 20 a day.  3. Slow iron twice daily.   PHYSICAL EXAMINATION:  GENERAL:  She is a pleasant middle-aged young  woman in no acute distress.  VITAL SIGNS:  Blood pressure was 116/84, pulse 78 and regular,  respirations were 18, weight was 175 pounds.  NECK:  Revealed no jugular distention.  LUNGS:  Clear bilaterally to auscultation.  No wheezes, rales, or  rhonchi are present.  There is no increased work of breathing.  CARDIAC:  Regular rate and rhythm and normal S1 and S2.  There are no  murmurs, rubs, or gallops.  ABDOMEN:  Soft, nontender, and nondistended.  There is no organomegaly.  EXTREMITIES:  Demonstrate no cyanosis, clubbing, or edema.  The pulses  are 2+ and symmetric.  NEUROLOGIC:  Alert and oriented x3.  Cranial nerves intact.   EKG demonstrated sinus rhythm with a short PR interval but no  ventricular preexcitation.   IMPRESSION:  1. Palpitations 4 years after catheter ablation of atrial flutter.  2. Morbid obesity, status post gastric bypass surgery.  3. History of hypertension, now resolved.  4. Severe anemia.   DISCUSSION:  It is unclear what the anemia, may be plain, but she is  clearly anemic and has not responded to oral iron.  The patient states  that she is going to be scheduled for either intravenous or  intramuscular iron administration.  I am not clear that she is having  recurrent arrhythmias, but because of her palpitations and documented  prior arrhythmia, I recommend that we undergo cardiac monitoring for a  month to see if we can document any of these.  I will plan to see her  back in 6 weeks.     Doylene Canning. Ladona Ridgel, MD  Electronically Signed    GWT/MedQ  DD: 05/01/2008  DT: 05/02/2008  Job #: [id number]   cc:   Milus Mallick. Lodema Hong, M.D.

## 2011-03-17 NOTE — Assessment & Plan Note (Signed)
[facility]                         ELECTROPHYSIOLOGY OFFICE NOTE   NAME:Barr, Alejandra OLIPHANT                       MRN:          [id number]  DATE:06/27/2008                            DOB:          Dec 22, 1985    [name] returns today for followup.  She is a very pleasant woman with  a history of tachypalpitations and documented atrial flutter, status  post ablation several years ago.  At that time, she was morbidly obese  and ultimately underwent gastric bypass surgery where she has  subsequently lost approximately 175 pounds.  She notes that over the  last several months, she had worsening palpitations, all in association  with fairly marked anemia.  She had been unable to absorb iron or for  that matter vitamin B12 following her gastric bypass procedure.  She has  recently been given multiple iron injections and her anemia is improved.  Her palpitations have also improved though she still occasionally get  some.  She has subsequently wore a cardiac monitor for 3 weeks and  during this time, she had sinus rhythm with sinus tachycardia, but no  documented arrhythmias.  There are very little in the way of ventricular  ectopy as well.  She returns today for followup.   MEDICATIONS:  1. Topamax 75 b.i.d.  2. Lamictal 200 daily.  3. Slow iron b.i.d.   PHYSICAL EXAMINATION:  GENERAL:  She is a pleasant woman in no acute  distress.  VITAL SIGNS:  Blood pressure was 106/67.  The pulse 93 and regular.  The  respirations were 18.  The weight was 176 pounds. NECK:  Revealed no  jugular distention.  LUNGS:  Clear bilaterally auscultation.  No wheezes, rales or rhonchi  are present.  CARDIOVASCULAR:  Revealed a regular rate and rhythm.  Normal S1 and S2.  No murmurs, rubs, or gallops present.  ABDOMEN:  Soft, nontender, and  nondistended.  No organomegaly.  EXTREMITIES:  Demonstrated no cyanosis, clubbing, or edema.  The pulses  were 2+ and symmetric.  NEUROLOGIC:  Alert and oriented x3.  Cranial nerves are intact.  Strength was 5/5 and symmetric.   IMPRESSION:  1. Palpitations, probably sinus tachycardia.  Although, I cannot      exclude an atrial arrhythmia.  2. History of atrial flutter, status post ablation.  3. Morbid obesity, status post gastric bypass surgery.  4. Iron deficiency anemia, now improving with intravenous iron.   DISCUSSION:  Overall, [name] is stable from an arrhythmia perspective.  I have recommend a period of watchful waiting.  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  My  experience has been that aerobic activity will sometimes result in  reduction in the amount of palpitations the patients were experiencing.  We will see her back on a p.r.n. basis.     Doylene Canning. Ladona Ridgel, MD  Electronically Signed    GWT/MedQ  DD: 06/27/2008  DT: 06/28/2008  Job #: [id number]   cc:   Milus Mallick. Lodema Hong,  M.D. 

## 2011-03-20 NOTE — Procedures (Signed)
Alejandra Barr, Alejandra Barr                ACCOUNT NO.:  [id number]   MEDICAL RECORD NO.:  [id number]          PATIENT TYPE:  OUT   LOCATION:  RESP                          FACILITY:  [facility]   PHYSICIAN:  Edward L. Juanetta Gosling, M.D.DATE OF BIRTH:  02/12/1986   DATE OF PROCEDURE:  DATE OF DISCHARGE:                              PULMONARY FUNCTION TEST   RESULTS:  1.  Spirometry is normal.  2.  Lung volumes are normal.  3.  DLCO is moderately reduced.  4.  Arterial blood gas suggests acute hyperventilation with normal      oxygenation.      ELH/MEDQ  D:  01/31/2005  T:  01/31/2005  Job:  [id number]   cc:   Milus Mallick. Lodema Hong, M.D.  [address]  [place], [place] [address]  Fax: [phone]

## 2011-03-20 NOTE — Procedures (Signed)
Alejandra Barr, Alejandra Barr                ACCOUNT NO.:  [id number]   MEDICAL RECORD NO.:  [id number]          PATIENT TYPE:  OUT   LOCATION:  SLEEP LAB                     FACILITY:  [facility]   PHYSICIAN:  Marcelyn Bruins, M.D. [facility] DATE OF BIRTH:  09/23/1986   DATE OF STUDY:  03/10/2005                              NOCTURNAL POLYSOMNOGRAM   REFERRING PHYSICIAN:  Dr. [name]   INDICATIONS FOR STUDY:  Persistent disorder of initiating or maintaining wakefulness.   EPWORTH SCORE:  9.   SLEEP ARCHITECTURE:  The patient had a total sleep time of 332 minutes with  adequate slow wave sleep but decreased REM. Sleep onset latency was mildly  prolonged at 36 minutes, and REM latency was prolonged at 193 minutes.   IMPRESSION:  1.  Moderate obstructive sleep apnea/hypopnea syndrome with a respiratory      disturbance index of 21 events per hour and O2 desaturation as low as      87%. Her events were not positional, however, occurred more consistently      during REM. Treatment for this degree of sleep apnea may include a trial      of weight loss alone, oral appliance, upper airway surgery, and finally      C-PAP. Clinical correlation is suggested regarding treatment choice.  2.  Mild to moderate snoring noted throughout.  3.  No clinically significant cardiac arrhythmias.      KC/MEDQ  D:  03/18/2005 10:50:46  T:  03/18/2005 11:59:40  Job:  [id number]   cc:   Milus Mallick. Lodema Hong, M.D.  [address]  [place], [place] [address]  Fax: [phone]

## 2011-03-20 NOTE — Op Note (Signed)
NAMEILANA, PREZIOSO                ACCOUNT NO.:  [id number]   MEDICAL RECORD NO.:  [id number]          PATIENT TYPE:  AMB   LOCATION:  DAY                           FACILITY:  [facility]   PHYSICIAN:  Lionel December, M.D.    DATE OF BIRTH:  November 20, 1985   DATE OF PROCEDURE:  02/19/2005  DATE OF DISCHARGE:  02/19/2005                                 OPERATIVE REPORT   PROCEDURE:  Esophagogastroduodenoscopy.   INDICATIONS:  Shivonne is a 25 year old Caucasian female with morbid obesity  who is having nausea, vomiting and epigastric pain for the last four weeks  and not responding to PPI therapy.  She was seen in the emergency room by  Dr. Greta Doom who requested urgent diagnostic EGD.  Procedure and risks  were reviewed with the patient and informed consent was obtained.   PREMEDICATION:  Cetacaine spray for pharyngeal topical anesthesia, Demerol  50 mg IV, Versed 20 mg IV in divided doses.   FINDINGS:  The procedures were performed in the endoscopy suite.  The  patient's vital signs and O2 saturation were monitored during the procedure  and remained stable.   The patient was placed in the left lateral recumbent position and Olympus  video scope was passed through the oropharynx without any difficulty into  the esophagus.   Esophagus:  Mucosa of the esophagus was normal.  Squamocolumnar junction was  unremarkable, located at 36 cm and hiatus was at 38.   Stomach:  It was empty and distended very well on insufflation.  Folds of  the proximal stomach were normal.  Examination of the mucosa and body,  antrum, pyloric channel as well as angularis and cardia was normal.   Duodenum:  Duodenal bulbar mucosa was normal.  Scope was passed to the  second part of the duodenum.  Mucosa and folds were normal.   The endoscope was withdrawn.  The patient tolerated the procedure well.   FINAL DIAGNOSIS:  Small sliding hiatal hernia. Otherwise normal  esophagogastroduodenoscopy.    RECOMMENDATIONS:  1.  She will continue antireflux measures, Nexium as before.  2.  Reglan 10 mg p.o. a.c. and at night.  3.  She will be scheduled for small bowel follow-through.      NR/MEDQ  D:  03/01/2005  T:  03/02/2005  Job:  [id number]   cc:   Milus Mallick. Lodema Hong, M.D.  [address]  [place], [place] [address]  Fax: [phone]

## 2011-03-20 NOTE — H&P (Signed)
   NAMELARI, Alejandra Barr                          ACCOUNT NO.:  [id number]   MEDICAL RECORD NO.:  [id number]                   PATIENT TYPE:  AMB   LOCATION:  DAY                                  FACILITY:  [facility]   PHYSICIAN:  Jerolyn Shin C. Katrinka Blazing, M.D.                DATE OF BIRTH:  30-Dec-1985   DATE OF ADMISSION:  DATE OF DISCHARGE:                                HISTORY & PHYSICAL   HISTORY OF PRESENT ILLNESS:  [age]-year-old female with a five-month  history of recurrent postprandial nausea without vomiting, but episodic  diarrhea.  Gallbladder ultrasound showed a small stone.  HIDA scan showed  ejection fraction of 22%.  She remains chronically symptomatic and is  scheduled for laparoscopic cholecystectomy.   PAST HISTORY:  She has bronchial asthma, chronic allergies and morbid  obesity.   MEDICATIONS:  1. Albuterol MDI.  2. Singulair 10 mg every day p.r.n.  3. Albuterol nebulizers p.r.n.  4. Clarinex p.r.n.  5. She takes a multivitamin.  6. Allergy shots.   ALLERGIES:  She has allergies to [facility], FLUMADINE and TAMIFLU.   PAST SURGICAL HISTORY:  She has had no major surgery.   SOCIAL HISTORY:  She does not drink, smoke or use drugs.   FAMILY HISTORY:  Family history is positive for heart disease in her mother.  She does not know the medical history of her father.  She has no siblings.   PHYSICAL EXAMINATION:  VITAL SIGNS:  Blood pressure 112/78, pulse 74,  respirations 18.  Weight 249 pounds.  HEENT:  Unremarkable.  NECK:  Neck is supple without JVD or bruit.  CHEST:  Chest clear to auscultation.  HEART:  Regular rate and rhythm without murmur, gallop or rub.  ABDOMEN:  Abdomen mildly obese but soft.  No tenderness in the epigastrium  or right upper quadrant.  No organomegaly.  EXTREMITIES:  No cyanosis, clubbing or edema.  NEUROLOGIC:  No focal motor, sensory or cerebellar deficit.   IMPRESSION:  1. Cholelithiasis with cholecystitis.  2.     Chronic bronchial  asthma.  3. Multiple allergies.   PLAN:  Laparoscopic cholecystectomy.                                               Dirk Dress. Katrinka Blazing, M.D.    LCS/MEDQ  D:  08/03/2002  T:  08/04/2002  Job:  [id number]

## 2011-03-20 NOTE — Consult Note (Signed)
Alejandra Barr, Alejandra Barr                ACCOUNT NO.:  [id number]   MEDICAL RECORD NO.:  [id number]          PATIENT TYPE:  EMS   LOCATION:  MAJO                         FACILITY:  [facility]   PHYSICIAN:  Marrian Salvage. Freida Busman, MD     DATE OF BIRTH:  1986/01/17   DATE OF CONSULTATION:  06/26/2006  DATE OF DISCHARGE:  06/26/2006                                   CONSULTATION   CONSULTING PHYSICIAN:  Dr. Weldon Inches in the [facility] Emergency Department.   PATIENT'S CARDIOLOGIST:  Dr. Dorethea Clan.   PATIENT'S ELECTROPHYSIOLOGIST:  Dr. Ladona Ridgel.   CHIEF COMPLAINT:  Palpitations and dizziness.   HISTORY OF PRESENT ILLNESS:  The patient is a 25 year old female.  She has  multiple medical problems for her age including a history of PSVT, which was  ablated in the past.  Unfortunately, she has had some persistent symptoms of  tachycardia since that time which were felt to be sinus tachycardia and has  tried to treat them with Toprol-XL with little success, according to the  patient.  She also has multiple other problems including obesity,  obstructive sleep apnea and anxiety.   The patient was in her usual state of health until today.  While out  shopping, she noticed that she was very weak and dizzy.  She also had  tingling in her arms.  She has had some milder symptoms of this during the  past few days.  She has been taking her Toprol as prescribed.  She denies  any other precipitants.  She is not using any drugs.  Given these symptoms,  she reported to the emergency department.   In the emergency department, the patient was stable.  Her initial EKG showed  sinus tachycardia at about 120.  However, she was kept on telemetry and her  heart rate gradually came down to a rate of the low 80s.  EKG showed no  abnormalities.  Her labs were basically normal.  We are consulted for  further management.   PAST MEDICAL HISTORY:  1. PSVT, status post ablation in 2004.  2. Sinus tachycardia, symptomatic, possibly  inappropriate.  3. Significant obesity.  4. Moderate obstructive sleep apnea.  5. Asthma.  6. Seasonal allergies.  7. Anxiety.   CURRENT MEDICATIONS:  1. Toprol-XL 50 mg daily.  2. Lexapro.  3. Klonopin.   ALLERGIES:  1. MORPHINE.  2. PENICILLIN.  3. TAMIFLU.  4. RONDEC.  5. FLUMADINE.   SOCIAL HISTORY:  The patient is a Engineer, civil (consulting).  She does not smoke.  She is not  using any drugs or alcohol.   FAMILY HISTORY:  No history of early CAD.   REVIEW OF SYSTEMS:  Negative in detail, except as outlined above.   PHYSICAL EXAMINATION:  VITAL SIGNS:  Temperature was afebrile.  Heart rate  121 initially, then down to 82.  Blood pressure was in the 90s over 30s with  a low pressure of 96/32.  Respirations 16.  Saturation 98% on room air.  GENERAL:  The patient was in no distress.  She is also significantly obese.  SKIN:  She had  no rash.  No jaundice.  NECK:  JVP was unable to be seen.  No thyromegaly.  LUNGS:  Clear to auscultation bilaterally.  HEART:  Regular rate and rhythm.  ABDOMEN:  Soft.  EXTREMITIES:  Without edema.  NEUROLOGICAL:  Intact.   LABORATORY DATA:  Laboratories today showed a sodium of 140, potassium 3.5,  creatinine 0.7, glucose 87, calcium 8.8.   Initial EKG showed sinus tachycardia at 121 with normal P waves, normal  intervals, normal axis and no ST-T changes.  Repeat EKG showed a rate of 82  with the same P wave morphology and no abnormalities.   IMPRESSION:  A 25 year old female with history of paroxysmal  supraventricular tachycardia that was ablated and now who has had some  difficulty with sinus tachycardia.  She again presents today with some sinus  tachycardia.  This is improved with some intravenous fluids and treatment of  her anxiety.  She now has an appropriate heart rate.  There is no evidence  of an abnormal rhythm or conduction.  The etiology of her sinus tachycardia  is unclear to me.  Obviously, there was some component of anxiety.  She may   have also been slightly dehydrated, as her sinus tachycardia improved with  hydration.  I do not feel that given the patient's prior workup that she is  at risk for significant adverse event at this time.   PLAN:  1. Continue Toprol 50 mg daily.  I told the patient that in a couple of      days if she would like to try a 3-day hiatus and see how she does, that      would be appropriate.  2. The patient will follow up with Dr. Dorethea Clan in about 1-2 weeks to report      how her symptoms have evolved over time.  3. The patient was told to check her blood pressure at home regularly and      see how if low blood pressures correlate with symptomatology as well.           ______________________________  Marrian Salvage. Freida Busman, MD    LAA/MEDQ  D:  06/27/2006  T:  06/28/2006  Job:  [id number]

## 2011-03-20 NOTE — Procedures (Signed)
   NAMECARSEN, Alejandra Barr                          ACCOUNT NO.:  [id number]   MEDICAL RECORD NO.:  [id number]                   PATIENT TYPE:  OUT   LOCATION:  RAD                                  FACILITY:  [facility]   PHYSICIAN:  Vida Roller, M.D.                DATE OF BIRTH:  Jun 17, 1986   DATE OF PROCEDURE:  DATE OF DISCHARGE:                                  ECHOCARDIOGRAM   REFERRING PHYSICIAN:  Milus Mallick. Lodema Hong, M.D.   TAPE NUMBER:  LB [id number], tape count 1306 through 1718.   HISTORY OF PRESENT ILLNESS:  This is an 25 year old female with  palpitations.  The technical quality of the study is adequate.   M-MODE TRACINGS:  1. The aorta is 29 mm.  2. The left atrium is 34 mm.  3. The septum is 10 mm.  4. Posterior wall is 10 mm.  5. Left ventricular diastolic dimension is 43 mm.  6. Left ventricular systolic dimension is 30 mm.   2-D AND DOPPLER IMAGING:  1. The left ventricular is normal size with normal systolic and diastolic     function.  No wall motion abnormality is seen.  2. The right ventricle is normal size with normal systolic function, no wall     motion abnormalities are seen.  3. Both atria are of normal size.  There is no evidence of atrial septal     defect.  4. The aortic valve is trileaflet, tri-commissure with no evidence of     stenosis or regurgitation.  5. The mitral valve is morphologically unremarkable with no evidence of     stenosis or regurgitation.  6. The tricuspid valve is morphologically unremarkable with no stenosis or     regurgitation.  7. The pulmonic valve is morphologically unremarkable with trace     insufficiency.  No stenosis is seen.  8. The pericardial structures are normal.  9. The ascending aorta is normal.  10.      The inferior vena cava is normal.      ___________________________________________                                            Vida Roller, M.D.   JH/MEDQ  D:  08/31/2003  T:  08/31/2003  Job:  [id number]

## 2011-06-24 ENCOUNTER — Encounter: Payer: Self-pay | Admitting: Family Medicine

## 2011-06-24 ENCOUNTER — Ambulatory Visit (INDEPENDENT_AMBULATORY_CARE_PROVIDER_SITE_OTHER): Payer: No Typology Code available for payment source | Admitting: Family Medicine

## 2011-06-24 VITALS — BP 100/68 | HR 93 | Resp 16 | Ht 62.25 in | Wt 172.4 lb

## 2011-06-24 DIAGNOSIS — Z13220 Encounter for screening for lipoid disorders: Secondary | ICD-10-CM

## 2011-06-24 DIAGNOSIS — Z733 Stress, not elsewhere classified: Secondary | ICD-10-CM

## 2011-06-24 DIAGNOSIS — F329 Major depressive disorder, single episode, unspecified: Secondary | ICD-10-CM

## 2011-06-24 DIAGNOSIS — J45909 Unspecified asthma, uncomplicated: Secondary | ICD-10-CM

## 2011-06-24 DIAGNOSIS — G44009 Cluster headache syndrome, unspecified, not intractable: Secondary | ICD-10-CM

## 2011-06-24 DIAGNOSIS — F411 Generalized anxiety disorder: Secondary | ICD-10-CM

## 2011-06-24 DIAGNOSIS — F439 Reaction to severe stress, unspecified: Secondary | ICD-10-CM

## 2011-06-24 DIAGNOSIS — R002 Palpitations: Secondary | ICD-10-CM

## 2011-06-24 DIAGNOSIS — R5381 Other malaise: Secondary | ICD-10-CM

## 2011-06-24 DIAGNOSIS — Z139 Encounter for screening, unspecified: Secondary | ICD-10-CM

## 2011-06-24 DIAGNOSIS — F3289 Other specified depressive episodes: Secondary | ICD-10-CM

## 2011-06-24 DIAGNOSIS — F32A Depression, unspecified: Secondary | ICD-10-CM | POA: Insufficient documentation

## 2011-06-24 DIAGNOSIS — D649 Anemia, unspecified: Secondary | ICD-10-CM

## 2011-06-24 DIAGNOSIS — G43909 Migraine, unspecified, not intractable, without status migrainosus: Secondary | ICD-10-CM

## 2011-06-24 DIAGNOSIS — D509 Iron deficiency anemia, unspecified: Secondary | ICD-10-CM

## 2011-06-24 DIAGNOSIS — R5383 Other fatigue: Secondary | ICD-10-CM

## 2011-06-24 MED ORDER — KETOROLAC TROMETHAMINE 30 MG/ML IJ SOLN
60.0000 mg | Freq: Once | INTRAMUSCULAR | Status: AC
  Administered 2011-06-24: 60 mg via INTRAMUSCULAR

## 2011-06-24 MED ORDER — METHYLPREDNISOLONE ACETATE 80 MG/ML IJ SUSP
80.0000 mg | Freq: Once | INTRAMUSCULAR | Status: AC
  Administered 2011-06-24: 80 mg via INTRAMUSCULAR

## 2011-06-24 MED ORDER — BUTALBITAL-APAP-CAFFEINE 50-325-40 MG PO TABS: 1.0000 | ORAL_TABLET | Freq: Four times a day (QID) | ORAL | Status: AC | PRN

## 2011-06-24 MED ORDER — DULOXETINE HCL 20 MG PO CPEP: 20.0000 mg | ORAL_CAPSULE | Freq: Every day | ORAL | Status: DC

## 2011-06-24 MED ORDER — TEMAZEPAM 15 MG PO CAPS: 15.0000 mg | ORAL_CAPSULE | Freq: Every evening | ORAL | Status: DC | PRN

## 2011-06-24 NOTE — Patient Instructions (Addendum)
F/u in 6 weeks.  Meds as discussed have been sent in.  You are referred to therapy  A healthy diet is rich in fruit, vegetables and whole grains. Poultry fish, nuts and beans are a healthy choice for protein rather then red meat. A low sodium diet and drinking 64 ounces of water daily is generally recommended. Oils and sweet should be limited. Carbohydrates especially for those who are diabetic or overweight, should be limited to 34-45 gram per meal. It is important to eat on a regular schedule, at least 3 times daily. Snacks should be primarily fruits, vegetables or nuts.   It is important that you exercise regularly at least 30 minutes 5 times a week. If you develop chest pain, have severe difficulty breathing, or feel very tired, stop exercising immediately and seek medical attention

## 2011-06-25 LAB — LIPID PANEL
Cholesterol: 160 mg/dL (ref 0–200)
HDL: 53 mg/dL (ref >39)
LDL Cholesterol: 90 mg/dL (ref 0–99)
Total CHOL/HDL Ratio: 3 Ratio
Triglycerides: 87 mg/dL (ref <150)
VLDL: 17 mg/dL (ref 0–40)

## 2011-06-25 LAB — BASIC METABOLIC PANEL
BUN: 17 mg/dL (ref 6–23)
CO2: 24 mEq/L (ref 19–32)
Calcium: 9.3 mg/dL (ref 8.4–10.5)
Chloride: 102 mEq/L (ref 96–112)
Creat: 0.8 mg/dL (ref 0.50–1.10)
Glucose, Bld: 89 mg/dL (ref 70–99)
Potassium: 3.8 mEq/L (ref 3.5–5.3)
Sodium: 139 mEq/L (ref 135–145)

## 2011-06-25 LAB — CBC WITH DIFFERENTIAL/PLATELET
Basophils Absolute: 0.1 10*3/uL (ref 0.0–0.1)
Basophils Relative: 1 % (ref 0–1)
Eosinophils Absolute: 0.4 10*3/uL (ref 0.0–0.7)
Eosinophils Relative: 4 % (ref 0–5)
HCT: 42.4 % (ref 36.0–46.0)
Hemoglobin: 14 g/dL (ref 12.0–15.0)
Lymphocytes Relative: 25 % (ref 12–46)
Lymphs Abs: 2.6 10*3/uL (ref 0.7–4.0)
MCH: 30.4 pg (ref 26.0–34.0)
MCHC: 33 g/dL (ref 30.0–36.0)
MCV: 92 fL (ref 78.0–100.0)
Monocytes Absolute: 0.5 10*3/uL (ref 0.1–1.0)
Monocytes Relative: 4 % (ref 3–12)
Neutro Abs: 6.8 10*3/uL (ref 1.7–7.7)
Neutrophils Relative %: 66 % (ref 43–77)
Platelets: 262 10*3/uL (ref 150–400)
RBC: 4.61 MIL/uL (ref 3.87–5.11)
RDW: 13.4 % (ref 11.5–15.5)
WBC: 10.3 10*3/uL (ref 4.0–10.5)

## 2011-06-25 LAB — VITAMIN D 25 HYDROXY (VIT D DEFICIENCY, FRACTURES): Vit D, 25-Hydroxy: 24 ng/mL — ABNORMAL LOW (ref 30–89)

## 2011-06-25 LAB — TSH: TSH: 2.337 u[IU]/mL (ref 0.350–4.500)

## 2011-07-06 NOTE — Assessment & Plan Note (Signed)
Deteriorated, increased frequency, daily headaches, due to a combination of stress and poor sleep

## 2011-07-06 NOTE — Progress Notes (Signed)
  Subjective:    Patient ID: Alejandra Barr, female    DOB: 1986/09/18, 25 y.o.   MRN: [id number]  HPI Pt is in with a primary c/o stress and being overwhelmed. Her mother has been critically ill for severeal weeks, she is an only child and has HPOA. She is having difficulty with sleep, excessive crying, overwhelmed, depressed, angry, scared. She denies resorting to slipping back into alcohol dependence or depression, and is requesting help both with medication and with access to therapy.She denies suicidal or homicidal ideation and reports good support from her fiancee. She reports increased headache frequency and severiyt, daily headaches, current is a 6 to 8. No localized weakness or numbness   Review of Systems See HPI Denies recent fever or chills. Denies sinus pressure, nasal congestion, ear pain or sore throat. Denies chest congestion, productive cough or wheezing. Denies chest pains, palpitations and leg swelling Denies abdominal pain, nausea, vomiting,diarrhea or constipation.   Denies dysuria, frequency, hesitancy or incontinence. Denies joint pain, swelling and limitation in mobility.  Denies skin break down or rash.        Objective:   Physical Exam Patient alert and oriented and in no cardiopulmonary distress.  HEENT: No facial asymmetry, EOMI, no sinus tenderness,  oropharynx pink and moist.  Neck supple no adenopathy.  Chest: Clear to auscultation bilaterally.  CVS: S1, S2 no murmurs, no S3.  ABD: Soft non tender. Bowel sounds normal.  Ext: No edema  MS: Adequate ROM spine, shoulders, hips and knees.  Skin: Intact, no ulcerations or rash noted.  Psych: Good eye contact, Tearful,  anxious and  depressed appearing.  CNS: CN 2-12 intact, power, tone and sensation normal throughout.        Assessment & Plan:

## 2011-07-06 NOTE — Assessment & Plan Note (Signed)
Controlled, no recent flares 

## 2011-07-06 NOTE — Assessment & Plan Note (Signed)
Increased and unconrtolled, due to current life issues, will start med and seek therapy

## 2011-07-06 NOTE — Assessment & Plan Note (Signed)
Deteriorated due to increased stress surrounding critical illness of her mother, has not relapsed, and is requesting help with medications. Not actively suicidal or homicidal, denies hallucinations

## 2011-07-25 ENCOUNTER — Other Ambulatory Visit: Payer: Self-pay | Admitting: Family Medicine

## 2011-07-30 LAB — CBC
HCT: 38.8
Hemoglobin: 13.5
MCHC: 34.8
MCV: 82.3
Platelets: 312
RBC: 4.71
RDW: 14.6
WBC: 10.7 — ABNORMAL HIGH

## 2011-07-30 LAB — IRON AND TIBC
Iron: 87
Saturation Ratios: 19 — ABNORMAL LOW
TIBC: 455
UIBC: 368

## 2011-07-30 LAB — VITAMIN B12: Vitamin B-12: 212 (ref 211–911)

## 2011-07-30 LAB — RETICULOCYTES
RBC.: 4.71
Retic Count, Absolute: 51.8
Retic Ct Pct: 1.1

## 2011-07-30 LAB — FERRITIN: Ferritin: 10 (ref 10–291)

## 2011-07-30 LAB — COPPER, SERUM: Copper: 124 ug/dL (ref 70–155)

## 2011-07-30 LAB — METHYLMALONIC ACID, SERUM: Methylmalonic Acid, Quantitative: 149 nmol/L (ref 87–318)

## 2011-08-03 LAB — IRON AND TIBC
Iron: 71
Saturation Ratios: 22
TIBC: 326
UIBC: 255

## 2011-08-03 LAB — CBC
HCT: 41.8
Hemoglobin: 14.4
MCHC: 34.4
MCV: 92
Platelets: 291
RBC: 4.54
RDW: 14.1
WBC: 8.7

## 2011-08-03 LAB — FERRITIN: Ferritin: 121 (ref 10–291)

## 2011-08-06 LAB — CBC
HCT: 38.8 % (ref 36.0–46.0)
Hemoglobin: 13.2 g/dL (ref 12.0–15.0)
MCHC: 34.1 g/dL (ref 30.0–36.0)
MCV: 90.2 fL (ref 78.0–100.0)
Platelets: 296 10*3/uL (ref 150–400)
RBC: 4.3 MIL/uL (ref 3.87–5.11)
RDW: 12.9 % (ref 11.5–15.5)
WBC: 10.1 10*3/uL (ref 4.0–10.5)

## 2011-08-06 LAB — FERRITIN: Ferritin: 26 ng/mL (ref 10–291)

## 2011-08-07 ENCOUNTER — Ambulatory Visit (HOSPITAL_COMMUNITY): Payer: Self-pay | Admitting: Oncology

## 2011-08-25 ENCOUNTER — Encounter: Payer: Self-pay | Admitting: Family Medicine

## 2011-08-26 ENCOUNTER — Encounter: Payer: Self-pay | Admitting: Family Medicine

## 2011-08-26 ENCOUNTER — Ambulatory Visit: Payer: No Typology Code available for payment source | Admitting: Family Medicine

## 2011-09-02 ENCOUNTER — Encounter: Payer: Self-pay | Admitting: Family Medicine

## 2011-09-07 ENCOUNTER — Ambulatory Visit: Payer: No Typology Code available for payment source | Admitting: Family Medicine

## 2011-10-16 ENCOUNTER — Other Ambulatory Visit: Payer: Self-pay | Admitting: Family Medicine

## 2011-10-23 ENCOUNTER — Telehealth: Payer: Self-pay | Admitting: Family Medicine

## 2011-11-02 NOTE — Telephone Encounter (Signed)
Needs clinincal evaliuatrion, advise urgent care if still symptomatic, or ed

## 2011-11-02 NOTE — Telephone Encounter (Signed)
Called and left message for pt to return call if she is still symptomatic.

## 2011-12-31 ENCOUNTER — Encounter: Payer: Self-pay | Admitting: Family Medicine

## 2011-12-31 ENCOUNTER — Ambulatory Visit (INDEPENDENT_AMBULATORY_CARE_PROVIDER_SITE_OTHER): Payer: Self-pay | Admitting: Family Medicine

## 2011-12-31 VITALS — BP 104/70 | HR 98 | Temp 98.6°F | Resp 16 | Ht 62.5 in | Wt 183.8 lb

## 2011-12-31 DIAGNOSIS — J189 Pneumonia, unspecified organism: Secondary | ICD-10-CM

## 2011-12-31 DIAGNOSIS — F3289 Other specified depressive episodes: Secondary | ICD-10-CM

## 2011-12-31 DIAGNOSIS — F32A Depression, unspecified: Secondary | ICD-10-CM

## 2011-12-31 DIAGNOSIS — J40 Bronchitis, not specified as acute or chronic: Secondary | ICD-10-CM | POA: Insufficient documentation

## 2011-12-31 DIAGNOSIS — R002 Palpitations: Secondary | ICD-10-CM

## 2011-12-31 DIAGNOSIS — J209 Acute bronchitis, unspecified: Secondary | ICD-10-CM

## 2011-12-31 DIAGNOSIS — F329 Major depressive disorder, single episode, unspecified: Secondary | ICD-10-CM

## 2011-12-31 MED ORDER — SULFAMETHOXAZOLE-TRIMETHOPRIM 800-160 MG PO TABS: 1.0000 | ORAL_TABLET | Freq: Two times a day (BID) | ORAL | Status: AC

## 2011-12-31 MED ORDER — BENZONATATE 100 MG PO CAPS: 100.0000 mg | ORAL_CAPSULE | Freq: Two times a day (BID) | ORAL | Status: DC | PRN

## 2011-12-31 MED ORDER — FLUCONAZOLE 150 MG PO TABS: ORAL_TABLET | ORAL | Status: AC

## 2011-12-31 NOTE — Progress Notes (Signed)
  Subjective:    Patient ID: Alejandra Barr, female    DOB: Nov 06, 1985, 26 y.o.   MRN: [id number]  HPI 3 day h/o chest congestion with productive sputum,  Fever and chills. Generalized malaise, muscle aches and pains.  Review of Systems See HPI . Denies sinus pressure, nasal congestion, ear pain or sore throat.  Denies chest pains, palpitations and leg swelling Denies abdominal pain, nausea, vomiting,diarrhea or constipation.   Denies dysuria, frequency, hesitancy or incontinence. Denies uncontrolled  Depression,has increased  Anxiety due to her mom's ill health Denies skin break down or rash.        Objective:   Physical Exam Patient alert and oriented and in no cardiopulmonary distress.Ill appearing  HEENT: No facial asymmetry, EOMI, no sinus tenderness,  oropharynx pink and moist.  Neck supple no adenopathy.  Chest: Adequate air entry, few crackles, no wheezes CVS: S1, S2 no murmurs, no S3.  ABD: Soft non tender. Bowel sounds normal.  Ext: No edema  MS: Adequate ROM spine, shoulders, hips and knees.   Psych: Good eye contact, normal affect. Memory intact mildly  anxious not  depressed appearing.  CNS: CN 2-12 intact, power, tone and sensation normal throughout.        Assessment & Plan:

## 2011-12-31 NOTE — Patient Instructions (Addendum)
F/u as needed  You are being treated for acute bronchitis.you will get  Information on this  Medication has been sent in to [facility] pharmacy. Please drink a lot of fluids and get rest  Please call if you do not  feel better

## 2012-01-01 NOTE — Assessment & Plan Note (Signed)
Controlled, no change in medication  

## 2012-01-01 NOTE — Assessment & Plan Note (Signed)
Acute symptom onset, will treat aggressively, pt has asthma which has been stable for a long time

## 2012-01-01 NOTE — Assessment & Plan Note (Signed)
S/p ablation and on betablocker

## 2012-08-01 ENCOUNTER — Encounter: Payer: Self-pay | Admitting: Family Medicine

## 2012-08-01 ENCOUNTER — Ambulatory Visit (INDEPENDENT_AMBULATORY_CARE_PROVIDER_SITE_OTHER): Payer: PRIVATE HEALTH INSURANCE | Admitting: Family Medicine

## 2012-08-01 ENCOUNTER — Telehealth: Payer: Self-pay | Admitting: Family Medicine

## 2012-08-01 VITALS — BP 118/72 | HR 80 | Resp 18 | Ht 62.25 in | Wt 195.0 lb

## 2012-08-01 DIAGNOSIS — J45909 Unspecified asthma, uncomplicated: Secondary | ICD-10-CM

## 2012-08-01 DIAGNOSIS — Z13220 Encounter for screening for lipoid disorders: Secondary | ICD-10-CM

## 2012-08-01 DIAGNOSIS — F329 Major depressive disorder, single episode, unspecified: Secondary | ICD-10-CM

## 2012-08-01 DIAGNOSIS — G44009 Cluster headache syndrome, unspecified, not intractable: Secondary | ICD-10-CM

## 2012-08-01 DIAGNOSIS — G4700 Insomnia, unspecified: Secondary | ICD-10-CM

## 2012-08-01 DIAGNOSIS — Z23 Encounter for immunization: Secondary | ICD-10-CM

## 2012-08-01 DIAGNOSIS — D649 Anemia, unspecified: Secondary | ICD-10-CM

## 2012-08-01 DIAGNOSIS — F32A Depression, unspecified: Secondary | ICD-10-CM

## 2012-08-01 DIAGNOSIS — F3289 Other specified depressive episodes: Secondary | ICD-10-CM

## 2012-08-01 DIAGNOSIS — E559 Vitamin D deficiency, unspecified: Secondary | ICD-10-CM

## 2012-08-01 DIAGNOSIS — F17200 Nicotine dependence, unspecified, uncomplicated: Secondary | ICD-10-CM

## 2012-08-01 DIAGNOSIS — E669 Obesity, unspecified: Secondary | ICD-10-CM

## 2012-08-01 LAB — COMPREHENSIVE METABOLIC PANEL
ALT: 10 U/L (ref 0–35)
AST: 18 U/L (ref 0–37)
Albumin: 4.1 g/dL (ref 3.5–5.2)
Alkaline Phosphatase: 113 U/L (ref 39–117)
BUN: 14 mg/dL (ref 6–23)
CO2: 26 mEq/L (ref 19–32)
Calcium: 9.3 mg/dL (ref 8.4–10.5)
Chloride: 106 mEq/L (ref 96–112)
Creat: 0.78 mg/dL (ref 0.50–1.10)
Glucose, Bld: 82 mg/dL (ref 70–99)
Potassium: 4.6 mEq/L (ref 3.5–5.3)
Sodium: 138 mEq/L (ref 135–145)
Total Bilirubin: 0.8 mg/dL (ref 0.3–1.2)
Total Protein: 6.3 g/dL (ref 6.0–8.3)

## 2012-08-01 LAB — CBC
HCT: 39.9 % (ref 36.0–46.0)
Hemoglobin: 13.4 g/dL (ref 12.0–15.0)
MCH: 30.7 pg (ref 26.0–34.0)
MCHC: 33.6 g/dL (ref 30.0–36.0)
MCV: 91.3 fL (ref 78.0–100.0)
Platelets: 252 10*3/uL (ref 150–400)
RBC: 4.37 MIL/uL (ref 3.87–5.11)
RDW: 13.2 % (ref 11.5–15.5)
WBC: 8.7 10*3/uL (ref 4.0–10.5)

## 2012-08-01 LAB — LIPID PANEL
Cholesterol: 153 mg/dL (ref 0–200)
HDL: 82 mg/dL (ref >39)
LDL Cholesterol: 60 mg/dL (ref 0–99)
Total CHOL/HDL Ratio: 1.9 Ratio
Triglycerides: 55 mg/dL (ref <150)
VLDL: 11 mg/dL (ref 0–40)

## 2012-08-01 LAB — TSH: TSH: 1.251 u[IU]/mL (ref 0.350–4.500)

## 2012-08-01 LAB — VITAMIN B12: Vitamin B-12: 547 pg/mL (ref 211–911)

## 2012-08-01 LAB — FERRITIN: Ferritin: 27 ng/mL (ref 10–291)

## 2012-08-01 LAB — IRON: Iron: 119 ug/dL (ref 42–145)

## 2012-08-01 MED ORDER — RIZATRIPTAN BENZOATE 10 MG PO TABS: 10.0000 mg | ORAL_TABLET | ORAL | Status: DC | PRN

## 2012-08-01 MED ORDER — DULOXETINE HCL 30 MG PO CPEP: 30.0000 mg | ORAL_CAPSULE | Freq: Every day | ORAL | Status: DC

## 2012-08-01 MED ORDER — TEMAZEPAM 15 MG PO CAPS: 15.0000 mg | ORAL_CAPSULE | Freq: Every evening | ORAL | Status: DC | PRN

## 2012-08-01 MED ORDER — BUPROPION HCL ER (SMOKING DET) 150 MG PO TB12: 150.0000 mg | ORAL_TABLET | Freq: Two times a day (BID) | ORAL | Status: DC

## 2012-08-01 MED ORDER — METOPROLOL SUCCINATE ER 25 MG PO TB24: 25.0000 mg | ORAL_TABLET | Freq: Every day | ORAL | Status: DC

## 2012-08-01 NOTE — Patient Instructions (Addendum)
F/u end December.Please call if you need me before  Please set quit date at most 3 weeks after starting zyban  Medication to be resumed for depression, call back if you decide to see a therapist, I believe you will benefit greatly.  Behavioral changes as discussed, including daily exercise and limiting food intake  I KNOW that you will get better   All the best with upcoming exams.  Labs today cbc, iron and ferritin and B12, TSH , hBA1C, Vut D, lipid, cmp

## 2012-08-01 NOTE — Progress Notes (Signed)
  Subjective:    Patient ID: Alejandra Barr, female    DOB: Jan 19, 1986, 26 y.o.   MRN: [id number]  HPI The PT is here for follow up and re-evaluation of chronic medical conditions, medication management and review of any available recent lab and radiology data.  Preventive health is updated, specifically  Cancer screening and Immunization.   Questions or concerns regarding consultations or procedures which the PT has had in the interim are  addressed. The PT denies any adverse reactions to current medications since the last visit.  Has gradually decompensated and reverted  To alcohol and nicotine dependence as well as depression and weight gain in the past 9 month Her mother's health has been extremely poor in the past approx 15 months and this has been the major trigger I believe. However though her mom is markedly improved, Terriona continues to deteriorate.Not suicidal or homicidal. States her spouse told her to get help. Has been off antidepressants, no money or insurance      Review of Systems See HPI Denies recent fever or chills. Denies sinus pressure, nasal congestion, ear pain or sore throat. Denies chest congestion, productive cough has had increased wheezing. Denies chest pains, palpitations and leg swelling Denies abdominal pain, nausea, vomiting,diarrhea or constipation.   Denies dysuria, frequency, hesitancy or incontinence. Denies joint pain, swelling and limitation in mobility. Denies headaches, seizures, numbness, or tingling.  Denies skin break down or rash.        Objective:   Physical Exam Patient alert and oriented and in no cardiopulmonary distress.  HEENT: No facial asymmetry, EOMI, no sinus tenderness,  oropharynx pink and moist.  Neck supple no adenopathy.  Chest: Clear to auscultation bilaterally.  CVS: S1, S2 no murmurs, no S3.  ABD: Soft non tender. Bowel sounds normal.  Ext: No edema  MS: Adequate ROM spine, shoulders, hips and knees.  Skin:  Intact, no ulcerations or rash noted.  Psych: Good eye contact, blunted  affect. Memory intact tearful  depressed appearing.  CNS: CN 2-12 intact, power, tone and sensation normal throughout.        Assessment & Plan:

## 2012-08-01 NOTE — Telephone Encounter (Signed)
Referral has already been entered, let her know pls

## 2012-08-02 DIAGNOSIS — E559 Vitamin D deficiency, unspecified: Secondary | ICD-10-CM | POA: Insufficient documentation

## 2012-08-02 LAB — VITAMIN D 25 HYDROXY (VIT D DEFICIENCY, FRACTURES): Vit D, 25-Hydroxy: 17 ng/mL — ABNORMAL LOW (ref 30–89)

## 2012-08-02 NOTE — Telephone Encounter (Signed)
Called and left voice mail

## 2012-08-09 DIAGNOSIS — G4700 Insomnia, unspecified: Secondary | ICD-10-CM | POA: Insufficient documentation

## 2012-08-09 NOTE — Assessment & Plan Note (Signed)
Uncontrolled x 9 months has spiralled down. Not suicidal or homicidal,but has reverted to alcohol use

## 2012-08-09 NOTE — Assessment & Plan Note (Signed)
Pt to take weekly vit D till corrected

## 2012-08-09 NOTE — Assessment & Plan Note (Signed)
Sleep hygiene reviewed and pt to start medication

## 2012-08-09 NOTE — Assessment & Plan Note (Addendum)
Increased wheeze in recent times.Likely due to season change, albuterol MDI prescribed

## 2012-08-09 NOTE — Assessment & Plan Note (Signed)
Deteriorated, pt has started smoking in the past 69 months again Patient counseled for approximately 5 minutes regarding the health risks of ongoing nicotine use, specifically all types of cancer, heart disease, stroke and respiratory failure. The options available for help with cessation ,the behavioral changes to assist the process, and the option to either gradully reduce usage  Or abruptly stop.is also discussed. Pt is also encouraged to set specific goals in number of cigarettes used daily, as well as to set a quit date.

## 2012-08-09 NOTE — Assessment & Plan Note (Signed)
Deteriorated. Patient re-educated about  the importance of commitment to a  minimum of 150 minutes of exercise per week. The importance of healthy food choices with portion control discussed. Encouraged to start a food diary, count calories and to consider  joining a support group. Sample diet sheets offered. Goals set by the patient for the next several months.    

## 2012-08-23 ENCOUNTER — Other Ambulatory Visit: Payer: Self-pay

## 2012-08-23 DIAGNOSIS — E559 Vitamin D deficiency, unspecified: Secondary | ICD-10-CM

## 2012-08-23 MED ORDER — ERGOCALCIFEROL 1.25 MG (50000 UT) PO CAPS: 50000.0000 [IU] | ORAL_CAPSULE | ORAL | Status: DC

## 2012-10-31 ENCOUNTER — Ambulatory Visit: Payer: PRIVATE HEALTH INSURANCE | Admitting: Family Medicine

## 2013-06-22 ENCOUNTER — Ambulatory Visit (INDEPENDENT_AMBULATORY_CARE_PROVIDER_SITE_OTHER): Payer: Medicare HMO | Admitting: Family Medicine

## 2013-06-22 ENCOUNTER — Encounter: Payer: Self-pay | Admitting: Family Medicine

## 2013-06-22 VITALS — BP 108/68 | HR 97 | Resp 16 | Ht 62.0 in | Wt 171.8 lb

## 2013-06-22 DIAGNOSIS — G44009 Cluster headache syndrome, unspecified, not intractable: Secondary | ICD-10-CM

## 2013-06-22 DIAGNOSIS — F17200 Nicotine dependence, unspecified, uncomplicated: Secondary | ICD-10-CM

## 2013-06-22 DIAGNOSIS — J45909 Unspecified asthma, uncomplicated: Secondary | ICD-10-CM

## 2013-06-22 DIAGNOSIS — E669 Obesity, unspecified: Secondary | ICD-10-CM

## 2013-06-22 DIAGNOSIS — Z139 Encounter for screening, unspecified: Secondary | ICD-10-CM

## 2013-06-22 DIAGNOSIS — R21 Rash and other nonspecific skin eruption: Secondary | ICD-10-CM

## 2013-06-22 DIAGNOSIS — F411 Generalized anxiety disorder: Secondary | ICD-10-CM | POA: Insufficient documentation

## 2013-06-22 DIAGNOSIS — R5381 Other malaise: Secondary | ICD-10-CM

## 2013-06-22 DIAGNOSIS — E559 Vitamin D deficiency, unspecified: Secondary | ICD-10-CM

## 2013-06-22 DIAGNOSIS — D649 Anemia, unspecified: Secondary | ICD-10-CM

## 2013-06-22 MED ORDER — PAROXETINE HCL 10 MG PO TABS: 10.0000 mg | ORAL_TABLET | Freq: Every day | ORAL | Status: DC

## 2013-06-22 NOTE — Progress Notes (Signed)
  Subjective:    Patient ID: Alejandra Barr, female    DOB: 09/08/1986, 27 y.o.   MRN: [id number]  HPI 6 week h/o generalized itching progressively worsening  Red circular marks over  Entire body, no purulent drainage, no fever  Or chills, has been to the ED at [facility], also to dermatologist, had biopsy done which is non revealing Pt's husband has been treated for scabies from occupational exposure, he is a nurse, she was also treated, condition is worsening. She has had her dogs tested for infection, none revealing, she does volunteer at an Furniture conservator/restorer also She feels as if parasites, or bugs are crawling inside of her, and has area on her right upper arm where she has literally picked out a piece of her flesh, syting she picked out a parasite, states she took  It to the dermatologist to see. She states the only area not affected is the pubic area. The most notable areas are both upper extremities. 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 She has a long psychiatric history, has been on no medication for some time stating that veery med she takes makes her "feel worse"No interest in psych referral at this time, but after discussion , willing to take paxil for GAD Does agree that currently she is extremely anxious and agitated, and attribute it to her skin condition. She does have concern about her Mom currently hospitalized at [facility] who she has not been able to visit because of her skin. Thinks she has some infestation, and is potentially contagious.  Review of Systems See HPI Denies recent fever or chills. Denies sinus pressure, nasal congestion, ear pain or sore throat. Denies chest congestion, productive cough or wheezing.No recent need to use proventil Denies chest pains, palpitations and leg swelling Denies abdominal pain, nausea, vomiting,diarrhea or constipation.   Denies dysuria, frequency, hesitancy or  incontinence. Denies joint pain, swelling and limitation in mobility. Denies headaches, seizures, numbness, or tingling. Denies depression,does have  anxiety .         Objective:   Physical Exam  Patient alert and oriented and in no cardiopulmonary distress.Anxious  HEENT: No facial asymmetry, EOMI,  oropharynx pink and moist.  Neck supple .  Chest: Clear to auscultation bilaterally.  CVS: S1, S2 no murmurs, no S3.  ABD: Soft non tender. Bowel sounds normal.  Ext: No edema  MS: Adequate ROM spine, shoulders, hips and knees.  Skin:multiple erythematous discoid areas on upper extremities, also to a much lesser extent on lower extremities and trunk. Area in right upper arm and right nipple picked at to extent that subcutaneous tissue is also showing  Psych: Good eye contact, anxious affect. Memory intact CNS: CN 2-12 intact, power, tone and sensation normal throughout.       Assessment & Plan:

## 2013-06-22 NOTE — Assessment & Plan Note (Addendum)
Uncontrolled , I do believe that her mental health is the  Main reason for current skin erruptions. She has no duicidal or homicidal ideation , she declines psych eval at this time, she will sart low dose paxil It is my opinion that she does need mental health treatment at this time also

## 2013-06-22 NOTE — Assessment & Plan Note (Signed)
Uses BC powder, advised of the risk of GI bleed

## 2013-06-22 NOTE — Assessment & Plan Note (Signed)
6 week h/o generalized itch, with multiple erythematous lesions primarily on upper extremities Pt is concerned about infectious cause. She has had a dermatology evaluation including biopsy, with no diagnosis determined. She has been treated for scabies through the [facility] ED She is picking out pieces of her skin as she is convinced that parasites or some type of infectious agent is inside of her body crawling all over , and needs to be treated  Will refer to ID

## 2013-06-22 NOTE — Patient Instructions (Addendum)
F/u in  2 month, call if you need me before  Fasting lipid, tsh , vit D ferritin HBa1C  As soon as possible   You are referred to the ID clinic.  Start paxil one daily for GAD  OK to use hydroxyzine at bedtime also sleep with gloves or socks on your hands

## 2013-06-22 NOTE — Assessment & Plan Note (Signed)
Smoking 1PPD  Pt encouraged to quit smoking

## 2013-06-23 ENCOUNTER — Other Ambulatory Visit: Payer: Self-pay | Admitting: Family Medicine

## 2013-06-23 LAB — LIPID PANEL
Cholesterol: 124 mg/dL (ref 0–200)
HDL: 51 mg/dL (ref >39)
LDL Cholesterol: 63 mg/dL (ref 0–99)
Total CHOL/HDL Ratio: 2.4 Ratio
Triglycerides: 51 mg/dL (ref <150)
VLDL: 10 mg/dL (ref 0–40)

## 2013-06-23 LAB — TSH: TSH: 1.293 u[IU]/mL (ref 0.350–4.500)

## 2013-06-23 LAB — FERRITIN: Ferritin: 8 ng/mL — ABNORMAL LOW (ref 10–291)

## 2013-06-23 LAB — VITAMIN D 25 HYDROXY (VIT D DEFICIENCY, FRACTURES): Vit D, 25-Hydroxy: 15 ng/mL — ABNORMAL LOW (ref 30–89)

## 2013-06-23 LAB — HEMOGLOBIN A1C
Hgb A1c MFr Bld: 5.8 % — ABNORMAL HIGH (ref <5.7)
Mean Plasma Glucose: 120 mg/dL — ABNORMAL HIGH (ref <117)

## 2013-06-26 ENCOUNTER — Other Ambulatory Visit: Payer: Self-pay | Admitting: Family Medicine

## 2013-06-26 MED ORDER — ERGOCALCIFEROL 1.25 MG (50000 UT) PO CAPS: 50000.0000 [IU] | ORAL_CAPSULE | ORAL | Status: DC

## 2013-06-27 ENCOUNTER — Telehealth: Payer: Self-pay

## 2013-06-27 ENCOUNTER — Other Ambulatory Visit: Payer: Self-pay | Admitting: Family Medicine

## 2013-06-27 DIAGNOSIS — R790 Abnormal level of blood mineral: Secondary | ICD-10-CM

## 2013-06-27 NOTE — Telephone Encounter (Signed)
pls let her know I have entered the referral

## 2013-07-03 ENCOUNTER — Encounter (HOSPITAL_COMMUNITY): Payer: Self-pay | Admitting: Emergency Medicine

## 2013-07-03 ENCOUNTER — Emergency Department (HOSPITAL_COMMUNITY)
Admission: EM | Admit: 2013-07-03 | Discharge: 2013-07-03 | Disposition: A | Discharge status: Home / Self Care | Payer: Private Health Insurance - Indemnity | Attending: Emergency Medicine | Admitting: Emergency Medicine

## 2013-07-03 DIAGNOSIS — Z79899 Other long term (current) drug therapy: Secondary | ICD-10-CM | POA: Insufficient documentation

## 2013-07-03 DIAGNOSIS — F17200 Nicotine dependence, unspecified, uncomplicated: Secondary | ICD-10-CM | POA: Insufficient documentation

## 2013-07-03 DIAGNOSIS — R21 Rash and other nonspecific skin eruption: Secondary | ICD-10-CM | POA: Insufficient documentation

## 2013-07-03 DIAGNOSIS — Z880 Allergy status to penicillin: Secondary | ICD-10-CM | POA: Insufficient documentation

## 2013-07-03 DIAGNOSIS — E669 Obesity, unspecified: Secondary | ICD-10-CM | POA: Insufficient documentation

## 2013-07-03 DIAGNOSIS — J45909 Unspecified asthma, uncomplicated: Secondary | ICD-10-CM | POA: Insufficient documentation

## 2013-07-03 DIAGNOSIS — F411 Generalized anxiety disorder: Secondary | ICD-10-CM | POA: Insufficient documentation

## 2013-07-03 DIAGNOSIS — D509 Iron deficiency anemia, unspecified: Secondary | ICD-10-CM | POA: Insufficient documentation

## 2013-07-03 DIAGNOSIS — F3289 Other specified depressive episodes: Secondary | ICD-10-CM | POA: Insufficient documentation

## 2013-07-03 DIAGNOSIS — Z885 Allergy status to narcotic agent status: Secondary | ICD-10-CM | POA: Insufficient documentation

## 2013-07-03 DIAGNOSIS — F329 Major depressive disorder, single episode, unspecified: Secondary | ICD-10-CM | POA: Insufficient documentation

## 2013-07-03 DIAGNOSIS — Z881 Allergy status to other antibiotic agents status: Secondary | ICD-10-CM | POA: Insufficient documentation

## 2013-07-03 MED ORDER — SULFAMETHOXAZOLE-TRIMETHOPRIM 800-160 MG PO TABS: 1.0000 | ORAL_TABLET | Freq: Two times a day (BID) | ORAL | Status: DC

## 2013-07-03 NOTE — ED Notes (Signed)
Pt developed a rash 8-9 weeks ago. Has been seen by derm,r/o scabies, had punch bx and told "she was delusional". 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 States rash has gotten worse.

## 2013-07-03 NOTE — ED Provider Notes (Signed)
CSN: [id number]     Arrival date & time 07/03/13  1017 History   First MD Initiated Contact with Patient 07/03/13 1027     Chief Complaint  Patient presents with  . Rash   (Consider location/radiation/quality/duration/timing/severity/associated sxs/prior Treatment) The history is provided by the patient. No language interpreter was used.  ANNELLA PROWELL is a 27 y/o F with PMHx of asthma, chronic anxiety, chronic depression, obesity presenting to the ED with rash that has been ongoing for the past 7 weeks. Patient reported that her husband was diagnosed with scabies and treated, patient reported that she noticed lesions on her body and was treated for scabies with permetherin cream. Patient reported that rash started on her left side of her abdomen and gradually started to spread. Patient reported that when the lesions erupt they are a red, raised lesion that is pruritic that then drains at times a clear red-tinge or pus discoloration every once in a while. Patient reported that she has noticed that the rash is localized to her chest - patient now concerned because the rash is now spreading to her face. Patient reported that she has noticed intermittent breast drainage. Stated that she has been to the dermatologist where a punch-out biopsy approximately 2-3 weeks ago that was inconclusive. Patient then reported that numerous physicians she has been seeing are telling her this is psych related. Patient reported that she has an appointment with Infectious disease on 07/11/2013, but reported that she wants to know what is going on now because she is worried. Denied fever, chills, neck pain, back pain, nausea, vomiting, diarrhea, chest pain, shortness of breath, difficulty breathing, throat closing sensation, angioedema, changes to food/clothing/soaps.   Past Medical History  Diagnosis Date  . Asthma   . Chronic anxiety   . Chronic depression   . Obesity    Past Surgical History  Procedure Laterality  Date  . Cholecystectomy  2003  . Tonsillectomy  1990  . Appendectomy  1990  . Bilateral eustachian tube placement on 4 different occasions    . Cardiac ablation    . Gastric bypass  2006   Family History  Problem Relation Age of Onset  . Fibromyalgia Mother     Chronic pelvic pain   . Breast cancer Mother   . Hypothyroidism Mother   . Obesity Mother   . Heart defect Mother     supraventricular, tachycardia   . Heart defect Mother     tachypalpation    History  Substance Use Topics  . Smoking status: Current Every Day Smoker -- 0.30 packs/day  . Smokeless tobacco: Not on file  . Alcohol Use: Yes     Comment: dependant with recent relaspe    OB History   Grav Para Term Preterm Abortions TAB SAB Ect Mult Living                 Review of Systems  Constitutional: Negative for fever and chills.  HENT: Negative for neck pain and neck stiffness.   Respiratory: Negative for shortness of breath.   Cardiovascular: Negative for chest pain.  Gastrointestinal: Negative for nausea, vomiting, abdominal pain and diarrhea.  Musculoskeletal: Negative for back pain and arthralgias.  Skin: Positive for rash.  Neurological: Negative for weakness, numbness and headaches.  All other systems reviewed and are negative.    Allergies  Ciprofloxacin; Morphine; and Penicillins  Home Medications   Current Outpatient Rx  Name  Route  Sig  Dispense  Refill  . albuterol (  PROVENTIL HFA;VENTOLIN HFA) 108 (90 BASE) MCG/ACT inhaler   Inhalation   Inhale 2 puffs into the lungs every 6 (six) hours as needed for wheezing.   18 g   0   . flintstones complete (FLINTSTONES) 60 MG chewable tablet   Oral   Chew 1 tablet by mouth 2 (two) times daily.          . metoprolol succinate (TOPROL XL) 25 MG 24 hr tablet   Oral   Take 1 tablet (25 mg total) by mouth daily.   30 tablet   2   . oxyCODONE-acetaminophen (PERCOCET/ROXICET) 5-325 MG per tablet   Oral   Take 1 tablet by mouth every 4 (four)  hours as needed for pain.         Marland Kitchen PARoxetine (PAXIL) 10 MG tablet   Oral   Take 1 tablet (10 mg total) by mouth daily.   30 tablet   3   . phentermine (ADIPEX-P) 37.5 MG tablet   Oral   Take 37.5 mg by mouth daily before breakfast.         . Vitamin D, Ergocalciferol, (DRISDOL) 50000 UNITS CAPS capsule   Oral   Take 50,000 Units by mouth every 30 (thirty) days. Around the 15th of the month         . sulfamethoxazole-trimethoprim (BACTRIM DS,SEPTRA DS) 800-160 MG per tablet   Oral   Take 1 tablet by mouth 2 (two) times daily. One po bid x 7 days   14 tablet   0    BP 116/64  Pulse 94  Temp(Src) 98.4 F (36.9 C) (Oral)  SpO2 100%  LMP 06/14/2013 Physical Exam  Nursing note and vitals reviewed. Constitutional: She is oriented to person, place, and time. She appears well-developed and well-nourished. No distress.  HENT:  Head: Normocephalic and atraumatic.  Mouth/Throat: Oropharynx is clear and moist. No oropharyngeal exudate.  Negative oral lesions or sores noted to the gumline and buccal mucosa Negative swelling, erythema, exudate, inflammation noted to the posterior oropharynx  Eyes: Conjunctivae and EOM are normal. Pupils are equal, round, and reactive to light. Right eye exhibits no discharge. Left eye exhibits no discharge.  Neck: Normal range of motion. Neck supple.  Negative neck stiffness Negative nuchal rigidity Negative lymphadenopathy  Cardiovascular: Normal rate, regular rhythm and normal heart sounds.  Exam reveals no friction rub.   No murmur heard. Pulses:      Radial pulses are 2+ on the right side, and 2+ on the left side.  Pulmonary/Chest: Effort normal. No respiratory distress. She has wheezes in the right lower field and the left lower field. She has no rales.  Patient has history of asthma, smokes cigarettes daily  Musculoskeletal: Normal range of motion.  Lymphadenopathy:    She has no cervical adenopathy.  Neurological: She is alert and  oriented to person, place, and time. She exhibits normal muscle tone. Coordination normal.  Skin: Skin is warm and dry. Rash noted. She is not diaphoretic.  Scattered scabbed over lesions localized to entire body. Negative surrounding erythema. Negative warmth upon palpation. Negative signs of ulcerations. Negative active drainage noted. Negative bleeding noted.  Psychiatric: She has a normal mood and affect. Her behavior is normal. Thought content normal.    ED Course  Procedures (including critical care time)  1:45 PM Patient seen and assessed by Dr. Abran Duke. Dr. Abran Duke recommended prednisone 40 mg daily for 10 days and for infectious disease consult to be performed.  2:22 PM  Consulted with Dr. Ninetta Lights, and infectious disease physician, discussed case and scenario with him. Dr. Ninetta Lights did not recommend prednisone. Dr. Ninetta Lights recommended patient be placed on Bactrim and to followup with dermatologist.  2:40 PM Discussed recommendations from infectious disease with patient. Patient did not want this provider to contact dermatologist since patient did not have a good rapport, patient reported that she does not want to see her dermatologist again, patient requested dermatology his recommendation. Patient did not like the relationship that she had nor did she like the way the dermatologist treated her while in the office.  Labs Review Labs Reviewed - No data to display Imaging Review No results found.  MDM   1. Rash    Patient presenting to the ED with rash that has been ongoing for the past 7 weeks. Patient has an appointment with ID on 07/11/2013. Alert and oriented. Negative facial swelling, negative angioedema. Scattered scabbed over lesions all over body, localized to chest and arms, a few spots noted to the face. Negative active drainage noted. Negative masses palpated on breasts bilaterally.  Dr. Abran Duke saw and assessed patient. Spoke with Dr. Ninetta Lights, recommended bactrim for  patient and cleared for discharge. Doubt SJS. Doubt erythema mulitforme major and minor. Doubt scabies. Doubt dermatitis. Rash etiology unknown. Patient stable, afebrile. Discharged patient with bactrim. Discussed with patient to follow with ID and to keep appointment. Discussed with patient to monitor trend of lesions. Discussed with patient to continue to monitor symptoms and if symptoms are to worsen or change to report back to the ED - strict return instructions given. Patient agreed to plan of care, understood, all questions answered.     Raymon Mutton, PA-C 07/05/13 0719  Medical screening examination/treatment/procedure(s) were conducted as a shared visit with non-physician practitioner(s) or resident  and myself.  I personally evaluated the patient during the encounter and agree with the findings and plan unless otherwise indicated.    Scattered scabbed lesions with excoriations all over body, no erythema or cellulitis, no oral lesions, no new medicines.  No meningismus.  Pt has had for 8 wks. And has derm fup.  Reasons to return given.  Well appearing otherwise.  Neck supple, full rom, no petechia/ purpura, RRR, mmm, no oral lesions.  DC with derm fup  Enid Skeens, MD 07/05/13 [phone]

## 2013-07-06 ENCOUNTER — Encounter: Payer: Self-pay | Admitting: Infectious Disease

## 2013-07-06 ENCOUNTER — Ambulatory Visit (INDEPENDENT_AMBULATORY_CARE_PROVIDER_SITE_OTHER): Payer: Private Health Insurance - Indemnity | Admitting: Infectious Disease

## 2013-07-06 VITALS — BP 122/85 | HR 94 | Temp 98.3°F | Ht 60.0 in | Wt 170.0 lb

## 2013-07-06 DIAGNOSIS — R21 Rash and other nonspecific skin eruption: Secondary | ICD-10-CM

## 2013-07-06 DIAGNOSIS — F4310 Post-traumatic stress disorder, unspecified: Secondary | ICD-10-CM

## 2013-07-06 DIAGNOSIS — E559 Vitamin D deficiency, unspecified: Secondary | ICD-10-CM

## 2013-07-06 DIAGNOSIS — R002 Palpitations: Secondary | ICD-10-CM

## 2013-07-06 DIAGNOSIS — K929 Disease of digestive system, unspecified: Secondary | ICD-10-CM

## 2013-07-06 DIAGNOSIS — K9189 Other postprocedural complications and disorders of digestive system: Secondary | ICD-10-CM

## 2013-07-06 DIAGNOSIS — F1020 Alcohol dependence, uncomplicated: Secondary | ICD-10-CM

## 2013-07-06 DIAGNOSIS — F22 Delusional disorders: Secondary | ICD-10-CM

## 2013-07-06 DIAGNOSIS — Y832 Surgical operation with anastomosis, bypass or graft as the cause of abnormal reaction of the patient, or of later complication, without mention of misadventure at the time of the procedure: Secondary | ICD-10-CM

## 2013-07-06 LAB — COMPLETE METABOLIC PANEL WITH GFR
ALT: 38 U/L — ABNORMAL HIGH (ref 0–35)
AST: 21 U/L (ref 0–37)
Albumin: 3.6 g/dL (ref 3.5–5.2)
Alkaline Phosphatase: 212 U/L — ABNORMAL HIGH (ref 39–117)
BUN: 16 mg/dL (ref 6–23)
CO2: 23 mEq/L (ref 19–32)
Calcium: 8.8 mg/dL (ref 8.4–10.5)
Chloride: 107 mEq/L (ref 96–112)
Creat: 0.63 mg/dL (ref 0.50–1.10)
GFR, Est African American: >89 mL/min
GFR, Est Non African American: >89 mL/min
Glucose, Bld: 75 mg/dL (ref 70–99)
Potassium: 4.2 mEq/L (ref 3.5–5.3)
Sodium: 141 mEq/L (ref 135–145)
Total Bilirubin: 0.2 mg/dL — ABNORMAL LOW (ref 0.3–1.2)
Total Protein: 5.9 g/dL — ABNORMAL LOW (ref 6.0–8.3)

## 2013-07-06 LAB — CBC WITH DIFFERENTIAL/PLATELET
Basophils Absolute: 0.1 10*3/uL (ref 0.0–0.1)
Basophils Relative: 1 % (ref 0–1)
Eosinophils Absolute: 0.6 10*3/uL (ref 0.0–0.7)
Eosinophils Relative: 5 % (ref 0–5)
HCT: 36.8 % (ref 36.0–46.0)
Hemoglobin: 12 g/dL (ref 12.0–15.0)
Lymphocytes Relative: 22 % (ref 12–46)
Lymphs Abs: 2.6 10*3/uL (ref 0.7–4.0)
MCH: 27.3 pg (ref 26.0–34.0)
MCHC: 32.6 g/dL (ref 30.0–36.0)
MCV: 83.6 fL (ref 78.0–100.0)
Monocytes Absolute: 0.6 10*3/uL (ref 0.1–1.0)
Monocytes Relative: 5 % (ref 3–12)
Neutro Abs: 8.1 10*3/uL — ABNORMAL HIGH (ref 1.7–7.7)
Neutrophils Relative %: 67 % (ref 43–77)
Platelets: 407 10*3/uL — ABNORMAL HIGH (ref 150–400)
RBC: 4.4 MIL/uL (ref 3.87–5.11)
RDW: 14.4 % (ref 11.5–15.5)
WBC: 11.9 10*3/uL — ABNORMAL HIGH (ref 4.0–10.5)

## 2013-07-06 MED ORDER — TRIAMCINOLONE ACETONIDE 0.5 % EX OINT: TOPICAL_OINTMENT | Freq: Two times a day (BID) | CUTANEOUS | Status: DC
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
## 2013-07-07 ENCOUNTER — Telehealth (HOSPITAL_COMMUNITY): Payer: Self-pay | Admitting: Hematology and Oncology

## 2013-07-07 ENCOUNTER — Other Ambulatory Visit (HOSPITAL_COMMUNITY): Payer: Self-pay | Admitting: Oncology

## 2013-07-07 ENCOUNTER — Encounter (HOSPITAL_COMMUNITY): Payer: Self-pay

## 2013-07-07 ENCOUNTER — Encounter (HOSPITAL_COMMUNITY): Payer: Private Health Insurance - Indemnity | Attending: Hematology and Oncology

## 2013-07-07 VITALS — BP 101/70 | HR 96 | Temp 98.3°F | Resp 16 | Ht 62.0 in | Wt 173.2 lb

## 2013-07-07 DIAGNOSIS — D721 Eosinophilia, unspecified: Secondary | ICD-10-CM

## 2013-07-07 DIAGNOSIS — E538 Deficiency of other specified B group vitamins: Secondary | ICD-10-CM

## 2013-07-07 DIAGNOSIS — R21 Rash and other nonspecific skin eruption: Secondary | ICD-10-CM

## 2013-07-07 DIAGNOSIS — D509 Iron deficiency anemia, unspecified: Secondary | ICD-10-CM

## 2013-07-07 DIAGNOSIS — D649 Anemia, unspecified: Secondary | ICD-10-CM

## 2013-07-07 LAB — RPR

## 2013-07-07 LAB — CBC WITH DIFFERENTIAL/PLATELET
Basophils Absolute: 0.1 10*3/uL (ref 0.0–0.1)
Basophils Relative: 1 % (ref 0–1)
Eosinophils Absolute: 0.7 10*3/uL (ref 0.0–0.7)
Eosinophils Relative: 6 % — ABNORMAL HIGH (ref 0–5)
HCT: 35.8 % — ABNORMAL LOW (ref 36.0–46.0)
Hemoglobin: 11.8 g/dL — ABNORMAL LOW (ref 12.0–15.0)
Lymphocytes Relative: 30 % (ref 12–46)
Lymphs Abs: 3.5 10*3/uL (ref 0.7–4.0)
MCH: 28.5 pg (ref 26.0–34.0)
MCHC: 33 g/dL (ref 30.0–36.0)
MCV: 86.5 fL (ref 78.0–100.0)
Monocytes Absolute: 0.5 10*3/uL (ref 0.1–1.0)
Monocytes Relative: 4 % (ref 3–12)
Neutro Abs: 6.8 10*3/uL (ref 1.7–7.7)
Neutrophils Relative %: 59 % (ref 43–77)
Platelets: 408 10*3/uL — ABNORMAL HIGH (ref 150–400)
RBC: 4.14 MIL/uL (ref 3.87–5.11)
RDW: 14.3 % (ref 11.5–15.5)
WBC: 11.5 10*3/uL — ABNORMAL HIGH (ref 4.0–10.5)

## 2013-07-07 LAB — DIFFERENTIAL
Band Neutrophils: 0 % (ref 0–10)
Basophils Relative: 1 % (ref 0–1)
Blasts: 0 %
Eosinophils Relative: 4 % (ref 0–5)
Lymphocytes Relative: 28 % (ref 12–46)
Metamyelocytes Relative: 0 %
Monocytes Relative: 8 % (ref 3–12)
Myelocytes: 0 %
Neutrophils Relative %: 59 % (ref 43–77)
Promyelocytes Absolute: 0 %
nRBC: 0 /100 WBC

## 2013-07-07 LAB — DRUG SCREEN, URINE
Amphetamine Screen, Ur: POSITIVE — AB
Barbiturate Quant, Ur: NEGATIVE
Benzodiazepines.: NEGATIVE
Cocaine Metabolites: NEGATIVE
Creatinine,U: 206.91 mg/dL
Marijuana Metabolite: NEGATIVE
Methadone: NEGATIVE
Opiates: NEGATIVE
Phencyclidine (PCP): NEGATIVE
Propoxyphene: NEGATIVE

## 2013-07-07 LAB — HEPATITIS PANEL, ACUTE
HCV Ab: NEGATIVE
Hep A IgM: NEGATIVE
Hep B C IgM: NEGATIVE
Hepatitis B Surface Ag: NEGATIVE

## 2013-07-07 LAB — TISSUE TRANSGLUTAMINASE, IGA: Tissue Transglutaminase Ab, IgA: 1.9 U/mL (ref <20)

## 2013-07-07 LAB — HIV ANTIBODY (ROUTINE TESTING W REFLEX): HIV: NONREACTIVE

## 2013-07-07 LAB — TISSUE TRANSGLUTAMINASE, IGG: Tissue Transglut Ab: 3.5 U/mL (ref <20)

## 2013-07-07 NOTE — Patient Instructions (Addendum)
[facility] Discharge Instructions  RECOMMENDATIONS MADE BY THE CONSULTANT AND ANY TEST RESULTS WILL BE SENT TO YOUR REFERRING PHYSICIAN.  EXAM FINDINGS BY THE PHYSICIAN TODAY AND SIGNS OR SYMPTOMS TO REPORT TO CLINIC OR PRIMARY PHYSICIAN: Exam and discussion by Dr. Sharia Reeve.  Will check some blood work today to see what your B12 and iron studies are.  Will also check CBC and get a smear for MD to review.  MEDICATIONS PRESCRIBED:  none  INSTRUCTIONS GIVEN AND DISCUSSED: Will get you scheduled for feraheme and B12 injection next week  SPECIAL INSTRUCTIONS/FOLLOW-UP: Follow-up with Blood work and MD in 1 month.  Thank you for choosing [facility] to provide your oncology and hematology care.  To afford each patient quality time with our providers, please arrive at least 15 minutes before your scheduled appointment time.  With your help, our goal is to use those 15 minutes to complete the necessary work-up to ensure our physicians have the information they need to help with your evaluation and healthcare recommendations.    Effective January 1st, 2014, we ask that you re-schedule your appointment with our physicians should you arrive 10 or more minutes late for your appointment.  We strive to give you quality time with our providers, and arriving late affects you and other patients whose appointments are after yours.    Again, thank you for choosing [facility].  Our hope is that these requests will decrease the amount of time that you wait before being seen by our physicians.       _____________________________________________________________  Should you have questions after your visit to [facility], please contact our office at [phone] between the hours of 8:30 a.m. and 5:00 p.m.  Voicemails left after 4:30 p.m. will not be returned until the following business day.  For prescription refill requests, have your pharmacy contact  our office with your prescription refill request.

## 2013-07-07 NOTE — Progress Notes (Addendum)
Patient History and Physical   Alejandra Barr [id number] [birth year] 27 y.o. 07/07/2013  Referring MD: Jessie Foot.  Chief Complaint: Iron deficiency anemia.   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 Mariel Sleet and will receive receiving IV iron .  She was also supposed to be on B12 injections which she states she is now in the past when you.  Patient has not returned for follow up since 2011. Her last IV iron was in 10/2010 and states that she felt that her iron level was normal to a point that she did not need any additional care . She states that in the past  couple of months she's been feeling very tired and fatigued.  She denies down craving for ice.  Denies shortness of breath.  No report of bloody or black stool. Patient between 8 does not work for now has not been anemic period last hemoglobin therefore 14 was 12. However her ferritin has declined progressively from 27 07/2012 to 8 in 06/2013. Last  B12 level in 9/ 2013 was 547  She has a rash involving the trunk and extremities which consist of scabs.  She tells me this has been biopsied in the past and that has been non diagnostic.  She is very concerned about this.  She also tells me she's been treated for scabies without improvement and that her husband has a similar rash. She has also reportedly tried Paxil in the past without much benefit.  The rash is not particularly pruritic. 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   PMH: Past Medical History  Diagnosis Date  . Asthma   . Chronic anxiety   . Chronic depression   . Obesity   . Iron deficiency     Past Surgical History  Procedure  Laterality Date  . Cholecystectomy  2003  . Tonsillectomy  1990  . Appendectomy  1990  . Bilateral eustachian tube placement on 4 different occasions    . Cardiac ablation    . Gastric bypass  2006    Allergies: Allergies  Allergen Reactions  . Ciprofloxacin Anaphylaxis  . Morphine Anaphylaxis  . Penicillins Anaphylaxis    Medications: Current outpatient prescriptions:albuterol (PROVENTIL HFA;VENTOLIN HFA) 108 (90 BASE) MCG/ACT inhaler, Inhale 2 puffs into the lungs every 6 (six) hours as needed for wheezing., Disp: 18 g, Rfl: 0;  flintstones complete (FLINTSTONES) 60 MG chewable tablet, Chew 1 tablet by mouth 2 (two) times daily. , Disp: , Rfl: ;  metoprolol succinate (TOPROL XL) 25 MG 24 hr tablet, Take 1 tablet (25 mg total) by mouth daily., Disp: 30 tablet, Rfl: 2 PARoxetine (PAXIL) 10 MG tablet, Take 1 tablet (10 mg total) by mouth daily., Disp: 30 tablet, Rfl: 3;  phentermine (ADIPEX-P) 37.5 MG tablet, Take 37.5 mg by mouth daily before breakfast., Disp: , Rfl: ;  sulfamethoxazole-trimethoprim (BACTRIM DS,SEPTRA DS) 800-160 MG per tablet, Take 1 tablet by mouth 2 (two) times daily. One po bid x 7 days, Disp: 14 tablet, Rfl: 0 triamcinolone ointment (KENALOG) 0.5 %, Apply topically 2 (two) times daily., Disp: 30 g, Rfl: 3;  Vitamin D, Ergocalciferol, (DRISDOL) 50000 UNITS CAPS  capsule, Take 50,000 Units by mouth every 30 (thirty) days. Around the 15th of the month, Disp: , Rfl: ;  oxyCODONE-acetaminophen (PERCOCET/ROXICET) 5-325 MG per tablet, Take 1 tablet by mouth every 4 (four) hours as needed for pain., Disp: , Rfl:    Social History:   reports that she has been smoking.  She has never used smokeless tobacco. She reports that  drinks alcohol. She reports that she does not use illicit drugs.  Family History: Family History  Problem Relation Age of Onset  . Fibromyalgia Mother     Chronic pelvic pain   . Breast cancer Mother   . Hypothyroidism Mother   . Obesity Mother   .  Heart defect Mother     supraventricular, tachycardia   . Heart defect Mother     tachypalpation     Review of Systems: 14 point review of system is as in the history above otherwise negative.   Physical Exam: Blood pressure 101/70, pulse 96, temperature 98.3 F (36.8 C), temperature source Oral, resp. rate 16, height 5\' 2"  (1.575 m), weight 173 lb 3.2 oz (78.563 kg), last menstrual period 06/14/2013. GENERAL: No distress, Obese. SKIN:  No rashes or significant lesions , diffuse rash (scabs) involving the upper and lower extremities, front part of the trunk and a few on the face.Marland Kitchen HEAD: Normocephalic, No masses, lesions, tenderness or abnormalities  EYES: Conjunctiva are pink , non-injected and no jaundice. LYMPH: No palpable lymphadenopathy,  In the neck supraclavicular area. LUNGS: Clear to auscultation , no crackles or wheezes HEART: regular rate & rhythm, no murmurs, no gallops, S1 normal and S2 normal  ABDOMEN: Abdomen soft, non-tender, normal bowel sounds, no masses or organomegaly and no hepatosplenomegaly palpable.  EXTREMITIES: No edema, no skin discoloration or tenderness. NEURO: Alert & oriented .     Lab Results: Lab Results  Component Value Date   WBC 11.9* 07/06/2013   HGB 12.0 07/06/2013   HCT 36.8 07/06/2013   MCV 83.6 07/06/2013   PLT 407* 07/06/2013     Chemistry      Component Value Date/Time   NA 141 07/06/2013 1520   K 4.2 07/06/2013 1520   CL 107 07/06/2013 1520   CO2 23 07/06/2013 1520   BUN 16 07/06/2013 1520   CREATININE 0.63 07/06/2013 1520      Component Value Date/Time   CALCIUM 8.8 07/06/2013 1520   ALKPHOS 212* 07/06/2013 1520   AST 21 07/06/2013 1520   ALT 38* 07/06/2013 1520   BILITOT 0.2* 07/06/2013 1520         Radiological Studies: No results found.    Impression: Alejandra Barr has iron deficiency and vitamin B12 deficiency with history of roux-en-Y gastric bypass. She is noncompliant with IV iron therapy, clinic visits and B12 injections. I had  a long discussion with her and counseled her on the need to be very compliant  Especially with B12 injections given that  deficiency can result in permanent neurologic damage and given altered gastric anatomy this will be a recurrent problem.  She verbalized understanding.  Her skin rashes suspicious for neurodermatitis however I will need this is the discretion of his dermatology and infectious is Dr.  Recommendations: 1.  I ordered feraheme given that if we felt to replace this at this time, it will only be a matter of time before she becomes profoundly anemic. 2.  I also ordered a B12 injection. 3.  I ordered CBC/CMP/iron studies and Vitamin B12 level.  I will  review peripheral blood smear. 4.  Patient will continue to follow with Dr. Lodema Hong for her routine medical care.     All questions were satisfactorily answered.She knows to call if she has any concern.  I spent 50% of the time was spent counseling the patient face to face. The total time spent in the appointment was 60 minutes.   Sherral Hammers, MD FACP. Hematology/Oncology.    Addendum: review of peripheral blood smear showed microcytosis, hyperchromia, polychromasia, anisopoikilocytosis, increased number of  eosinophils, some basophils and a few band forms.  Occasional pelger heut cells. Will order flow cytometry and strongyloides given unexplained rash.

## 2013-07-07 NOTE — Telephone Encounter (Signed)
AETNA-[id number] PER ANNA FERAHEM DOES NOT REQUIRE PRE AUTH ZOX#[id number]

## 2013-07-07 NOTE — Addendum Note (Signed)
Addended by: Sherral Hammers on: 07/07/2013 06:34 PM   Modules accepted: Orders

## 2013-07-08 ENCOUNTER — Other Ambulatory Visit (HOSPITAL_COMMUNITY): Payer: Self-pay | Admitting: Hematology and Oncology

## 2013-07-08 LAB — FERRITIN: Ferritin: 5 ng/mL — ABNORMAL LOW (ref 10–291)

## 2013-07-08 LAB — IRON AND TIBC
Iron: 17 ug/dL — ABNORMAL LOW (ref 42–135)
Saturation Ratios: 4 % — ABNORMAL LOW (ref 20–55)
TIBC: 407 ug/dL (ref 250–470)
UIBC: 390 ug/dL (ref 125–400)

## 2013-07-08 LAB — VITAMIN B12: Vitamin B-12: 301 pg/mL (ref 211–911)

## 2013-07-08 LAB — FOLATE: Folate: 7 ng/mL

## 2013-07-08 NOTE — Addendum Note (Signed)
Addended by: Sherral Hammers on: 07/08/2013 01:04 AM   Modules accepted: Level of Service

## 2013-07-10 ENCOUNTER — Other Ambulatory Visit (HOSPITAL_COMMUNITY): Payer: Self-pay | Admitting: Hematology and Oncology

## 2013-07-10 DIAGNOSIS — E538 Deficiency of other specified B group vitamins: Secondary | ICD-10-CM

## 2013-07-10 DIAGNOSIS — R7989 Other specified abnormal findings of blood chemistry: Secondary | ICD-10-CM

## 2013-07-10 HISTORY — DX: Deficiency of other specified B group vitamins: E53.8

## 2013-07-10 HISTORY — DX: Other specified abnormal findings of blood chemistry: R79.89

## 2013-07-11 ENCOUNTER — Ambulatory Visit: Payer: Medicare HMO | Admitting: Internal Medicine

## 2013-07-12 ENCOUNTER — Encounter (HOSPITAL_BASED_OUTPATIENT_CLINIC_OR_DEPARTMENT_OTHER): Payer: Private Health Insurance - Indemnity

## 2013-07-12 VITALS — BP 116/72 | HR 92 | Temp 98.2°F | Resp 16

## 2013-07-12 DIAGNOSIS — D509 Iron deficiency anemia, unspecified: Secondary | ICD-10-CM

## 2013-07-12 DIAGNOSIS — E538 Deficiency of other specified B group vitamins: Secondary | ICD-10-CM

## 2013-07-12 MED ORDER — SODIUM CHLORIDE 0.9 % IV SOLN
1020.0000 mg | Freq: Once | INTRAVENOUS | Status: AC
  Administered 2013-07-12: 1020 mg via INTRAVENOUS
  Filled 2013-07-12: qty 34

## 2013-07-12 MED ORDER — CYANOCOBALAMIN 1000 MCG/ML IJ SOLN
1000.0000 ug | Freq: Once | INTRAMUSCULAR | Status: AC
  Administered 2013-07-12: 1000 ug via INTRAMUSCULAR

## 2013-07-12 MED ORDER — CYANOCOBALAMIN 1000 MCG/ML IJ SOLN
INTRAMUSCULAR | Status: AC
  Filled 2013-07-12: qty 1

## 2013-07-12 NOTE — Progress Notes (Signed)
Received Fereheme 1020 mg Iv to left ac.  Also received  Vitamin  B-12 Im z-track to left deltoid.  Tolerated all well.

## 2013-07-17 ENCOUNTER — Ambulatory Visit: Payer: Medicare HMO | Admitting: Infectious Disease

## 2013-07-19 NOTE — Telephone Encounter (Signed)
Noted that patient has recently been seen by specialty clinic.

## 2013-08-02 ENCOUNTER — Encounter (HOSPITAL_COMMUNITY): Payer: Private Health Insurance - Indemnity | Attending: Hematology and Oncology

## 2013-08-02 DIAGNOSIS — D721 Eosinophilia, unspecified: Secondary | ICD-10-CM

## 2013-08-02 DIAGNOSIS — R21 Rash and other nonspecific skin eruption: Secondary | ICD-10-CM | POA: Insufficient documentation

## 2013-08-02 DIAGNOSIS — D649 Anemia, unspecified: Secondary | ICD-10-CM | POA: Insufficient documentation

## 2013-08-02 DIAGNOSIS — E538 Deficiency of other specified B group vitamins: Secondary | ICD-10-CM

## 2013-08-02 DIAGNOSIS — B789 Strongyloidiasis, unspecified: Secondary | ICD-10-CM | POA: Insufficient documentation

## 2013-08-02 DIAGNOSIS — R894 Abnormal immunological findings in specimens from other organs, systems and tissues: Secondary | ICD-10-CM | POA: Insufficient documentation

## 2013-08-02 DIAGNOSIS — D509 Iron deficiency anemia, unspecified: Secondary | ICD-10-CM | POA: Insufficient documentation

## 2013-08-02 LAB — COMPREHENSIVE METABOLIC PANEL
ALT: 19 U/L (ref 0–35)
AST: 19 U/L (ref 0–37)
Albumin: 3.6 g/dL (ref 3.5–5.2)
Alkaline Phosphatase: 114 U/L (ref 39–117)
BUN: 11 mg/dL (ref 6–23)
CO2: 23 mEq/L (ref 19–32)
Calcium: 9.3 mg/dL (ref 8.4–10.5)
Chloride: 106 mEq/L (ref 96–112)
Creatinine, Ser: 0.73 mg/dL (ref 0.50–1.10)
GFR calc Af Amer: >90 mL/min (ref >90)
GFR calc non Af Amer: >90 mL/min (ref >90)
Glucose, Bld: 79 mg/dL (ref 70–99)
Potassium: 4.1 mEq/L (ref 3.5–5.1)
Sodium: 139 mEq/L (ref 135–145)
Total Bilirubin: 0.2 mg/dL — ABNORMAL LOW (ref 0.3–1.2)
Total Protein: 6.6 g/dL (ref 6.0–8.3)

## 2013-08-02 LAB — CBC WITH DIFFERENTIAL/PLATELET
Basophils Absolute: 0.1 10*3/uL (ref 0.0–0.1)
Basophils Relative: 1 % (ref 0–1)
Eosinophils Absolute: 0.4 10*3/uL (ref 0.0–0.7)
Eosinophils Relative: 6 % — ABNORMAL HIGH (ref 0–5)
HCT: 37.3 % (ref 36.0–46.0)
Hemoglobin: 12.1 g/dL (ref 12.0–15.0)
Lymphocytes Relative: 34 % (ref 12–46)
Lymphs Abs: 2.2 10*3/uL (ref 0.7–4.0)
MCH: 29.2 pg (ref 26.0–34.0)
MCHC: 32.4 g/dL (ref 30.0–36.0)
MCV: 89.9 fL (ref 78.0–100.0)
Monocytes Absolute: 0.4 10*3/uL (ref 0.1–1.0)
Monocytes Relative: 5 % (ref 3–12)
Neutro Abs: 3.6 10*3/uL (ref 1.7–7.7)
Neutrophils Relative %: 54 % (ref 43–77)
Platelets: 206 10*3/uL (ref 150–400)
RBC: 4.15 MIL/uL (ref 3.87–5.11)
RDW: 17.6 % — ABNORMAL HIGH (ref 11.5–15.5)
WBC: 6.7 10*3/uL (ref 4.0–10.5)

## 2013-08-02 LAB — FERRITIN: Ferritin: 142 ng/mL (ref 10–291)

## 2013-08-02 LAB — IRON AND TIBC
Iron: 90 ug/dL (ref 42–135)
Saturation Ratios: 29 % (ref 20–55)
TIBC: 306 ug/dL (ref 250–470)
UIBC: 216 ug/dL (ref 125–400)

## 2013-08-02 LAB — VITAMIN B12: Vitamin B-12: 382 pg/mL (ref 211–911)

## 2013-08-02 NOTE — Progress Notes (Signed)
Labs drawn today for cbc/dii,cmp,strongyloides,Iron and IBC,b12,ferr

## 2013-08-04 ENCOUNTER — Encounter (HOSPITAL_BASED_OUTPATIENT_CLINIC_OR_DEPARTMENT_OTHER): Payer: Private Health Insurance - Indemnity

## 2013-08-04 ENCOUNTER — Ambulatory Visit (HOSPITAL_COMMUNITY): Payer: Private Health Insurance - Indemnity

## 2013-08-04 ENCOUNTER — Encounter (HOSPITAL_COMMUNITY): Payer: Self-pay

## 2013-08-04 VITALS — BP 105/73 | HR 94 | Temp 98.4°F | Resp 16 | Wt 168.1 lb

## 2013-08-04 DIAGNOSIS — E538 Deficiency of other specified B group vitamins: Secondary | ICD-10-CM

## 2013-08-04 DIAGNOSIS — E559 Vitamin D deficiency, unspecified: Secondary | ICD-10-CM

## 2013-08-04 DIAGNOSIS — D649 Anemia, unspecified: Secondary | ICD-10-CM

## 2013-08-04 DIAGNOSIS — R21 Rash and other nonspecific skin eruption: Secondary | ICD-10-CM

## 2013-08-04 DIAGNOSIS — R894 Abnormal immunological findings in specimens from other organs, systems and tissues: Secondary | ICD-10-CM

## 2013-08-04 DIAGNOSIS — R760 Raised antibody titer: Secondary | ICD-10-CM

## 2013-08-04 DIAGNOSIS — D509 Iron deficiency anemia, unspecified: Secondary | ICD-10-CM

## 2013-08-04 NOTE — Patient Instructions (Signed)
[facility] Discharge Instructions  RECOMMENDATIONS MADE BY THE CONSULTANT AND ANY TEST RESULTS WILL BE SENT TO YOUR REFERRING PHYSICIAN.  EXAM FINDINGS BY THE PHYSICIAN TODAY AND SIGNS OR SYMPTOMS TO REPORT TO CLINIC OR PRIMARY PHYSICIAN: Exam and findings as discussed by Dr. Zigmund Daniel.  INSTRUCTIONS/FOLLOW-UP: 1.  Return in 2 weeks as scheduled. 2.  Collect a stool specimen as directed - evaluating for ovum and parasites.  Thank you for choosing [facility] to provide your oncology and hematology care.  To afford each patient quality time with our providers, please arrive at least 15 minutes before your scheduled appointment time.  With your help, our goal is to use those 15 minutes to complete the necessary work-up to ensure our physicians have the information they need to help with your evaluation and healthcare recommendations.    Effective January 1st, 2014, we ask that you re-schedule your appointment with our physicians should you arrive 10 or more minutes late for your appointment.  We strive to give you quality time with our providers, and arriving late affects you and other patients whose appointments are after yours.    Again, thank you for choosing [facility].  Our hope is that these requests will decrease the amount of time that you wait before being seen by our physicians.       _____________________________________________________________  Should you have questions after your visit to [facility], please contact our office at [phone] between the hours of 8:30 a.m. and 5:00 p.m.  Voicemails left after 4:30 p.m. will not be returned until the following business day.  For prescription refill requests, have your pharmacy contact our office with your prescription refill request.

## 2013-08-04 NOTE — Progress Notes (Signed)
[facility] OFFICE PROGRESS NOTE  [name], MD [address] [place] [address]  DIAGNOSIS: ANEMIA  Low vitamin B12 level  Vitamin D deficiency  Rash and nonspecific skin eruption  Chief Complaint  Patient presents with  . Anemia    CURRENT THERAPY: IV iron on 07/12/2013, patient is status post gastric bypass.  INTERVAL HISTORY: Alejandra Barr 27 y.o. female returns for followup of multiple deficiency due to previous gastric bypass surgery, currently receiving vitamin B12, having received intravenous iron, and supplementing with vitamin D. She is mostly concerned about a diffuse rash involving her entire body. She has been evaluated by dermatology with a nonspecific biopsy result, infectious disease, and about 5 other physicians. She works as a traveling Sales executive and is currently caring for a mother who recently had surgery. Having undergone gastric bypass surgery, she has had chronic diarrhea but now is constipated requiring laxatives 3 days a week. On her last visit here she was evaluated for stronger ladies and was found to have abnormal antibody titers. She is concerned about parasitosis. 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  She has been treated in the past for scabies, bacterial infection with antibiotics, and steroid creams all of which were unsuccessful. Lesions are not especially pruritic. Workup for celiac disease was also negative. The lesions of burn and staining. She works with abnormal rescue  MEDICAL HISTORY: Past Medical History  Diagnosis Date  . Asthma   . Chronic anxiety   . Chronic depression   . Obesity   . Iron deficiency     INTERIM HISTORY: has OBESITY, UNSPECIFIED; ANEMIA; BIPOLAR II DISORDER; ANXIETY, CHRONIC; NICOTINE ADDICTION; CLUSTER HEADACHE; ATRIAL FLUTTER; ASTHMA,  UNSPECIFIED, UNSPECIFIED STATUS; PAIN IN JOINT, MULTIPLE SITES; PALPITATIONS; Depression; Vitamin D deficiency; Insomnia; Rash and nonspecific skin eruption; GAD (generalized anxiety disorder); and Low vitamin B12 level on her problem list.    ALLERGIES:  is allergic to ciprofloxacin; morphine; and penicillins.  MEDICATIONS: has a current medication list which includes the following prescription(s): albuterol, flintstones complete, metoprolol succinate, paroxetine, phentermine, sulfamethoxazole-trimethoprim, triamcinolone ointment, vitamin d (ergocalciferol), and oxycodone-acetaminophen.  SURGICAL HISTORY:  Past Surgical History  Procedure Laterality Date  . Cholecystectomy  2003  . Tonsillectomy  1990  . Bilateral eustachian tube placement on 4 different occasions    . Cardiac ablation    . Gastric bypass  2006    FAMILY HISTORY: family history includes Breast cancer in her mother; Fibromyalgia in her mother; Heart defect in her mother and mother; Hypothyroidism in her mother; Obesity in her mother.  SOCIAL HISTORY:  reports that she has been smoking.  She has never used smokeless tobacco. She reports that she does not drink alcohol or use illicit drugs.  REVIEW OF SYSTEMS:  Other than that discussed above is noncontributory.  PHYSICAL EXAMINATION: ECOG PERFORMANCE STATUS: 1 - Symptomatic but completely ambulatory  Blood pressure 105/73, pulse 94, temperature 98.4 F (36.9 C), temperature source Oral, resp. rate 16, weight 168 lb 1.6 oz (76.25 kg).  GENERAL:alert, no distress and comfortable SKIN: Punctate areas of inflammatory change each measuring about 5 mm in size with areas of encrustation and without any clear vesicles. EYES: PERLA; Conjunctiva are pink and non-injected, sclera clear OROPHARYNX:no exudate, no erythema on lips, buccal mucosa, or tongue. NECK: supple, thyroid normal size, non-tender, without nodularity. No masses CHEST: Normal AP  diameter with no breast  masses. LYMPH:  no palpable lymphadenopathy in the cervical, axillary or inguinal LUNGS: clear to auscultation and percussion with normal breathing effort HEART: regular rate & rhythm and no murmurs and no lower extremity edema ABDOMEN:abdomen soft, non-tender and normal bowel sounds MUSCULOSKELETAL:no cyanosis of digits and no clubbing. Range of motion normal.  NEURO: alert & oriented x 3 with fluent speech, no focal motor/sensory deficits   LABORATORY DATA: Infusion on 08/02/2013  Component Date Value Range Status  . Sodium 08/02/2013 139  135 - 145 mEq/L Final  . Potassium 08/02/2013 4.1  3.5 - 5.1 mEq/L Final  . Chloride 08/02/2013 106  96 - 112 mEq/L Final  . CO2 08/02/2013 23  19 - 32 mEq/L Final  . Glucose, Bld 08/02/2013 79  70 - 99 mg/dL Final  . BUN 16/08/9603 11  6 - 23 mg/dL Final  . Creatinine, Ser 08/02/2013 0.73  0.50 - 1.10 mg/dL Final  . Calcium 54/07/8118 9.3  8.4 - 10.5 mg/dL Final  . Total Protein 08/02/2013 6.6  6.0 - 8.3 g/dL Final  . Albumin 14/78/2956 3.6  3.5 - 5.2 g/dL Final  . AST 21/30/8657 19  0 - 37 U/L Final  . ALT 08/02/2013 19  0 - 35 U/L Final  . Alkaline Phosphatase 08/02/2013 114  39 - 117 U/L Final  . Total Bilirubin 08/02/2013 0.2* 0.3 - 1.2 mg/dL Final  . GFR calc non Af Amer 08/02/2013 >90  >90 mL/min Final  . GFR calc Af Amer 08/02/2013 >90  >90 mL/min Final   Comment: (NOTE)                          The eGFR has been calculated using the CKD EPI equation.                          This calculation has not been validated in all clinical situations.                          eGFR's persistently <90 mL/min signify possible Chronic Kidney                          Disease.  . WBC 08/02/2013 6.7  4.0 - 10.5 K/uL Final  . RBC 08/02/2013 4.15  3.87 - 5.11 MIL/uL Final  . Hemoglobin 08/02/2013 12.1  12.0 - 15.0 g/dL Final  . HCT 84/69/6295 37.3  36.0 - 46.0 % Final  . MCV 08/02/2013 89.9  78.0 - 100.0 fL Final  . MCH 08/02/2013 29.2  26.0 - 34.0 pg  Final  . MCHC 08/02/2013 32.4  30.0 - 36.0 g/dL Final  . RDW 28/41/3244 17.6* 11.5 - 15.5 % Final  . Platelets 08/02/2013 206  150 - 400 K/uL Final  . Neutrophils Relative % 08/02/2013 54  43 - 77 % Final  . Neutro Abs 08/02/2013 3.6  1.7 - 7.7 K/uL Final  . Lymphocytes Relative 08/02/2013 34  12 - 46 % Final  . Lymphs Abs 08/02/2013 2.2  0.7 - 4.0 K/uL Final  . Monocytes Relative 08/02/2013 5  3 - 12 % Final  . Monocytes Absolute 08/02/2013 0.4  0.1 - 1.0 K/uL Final  . Eosinophils Relative 08/02/2013 6* 0 - 5 % Final  . Eosinophils Absolute 08/02/2013 0.4  0.0 - 0.7 K/uL Final  . Basophils Relative  08/02/2013 1  0 - 1 % Final  . Basophils Absolute 08/02/2013 0.1  0.0 - 0.1 K/uL Final  . Iron 08/02/2013 90  42 - 135 ug/dL Final  . TIBC 16/08/9603 306  250 - 470 ug/dL Final  . Saturation Ratios 08/02/2013 29  20 - 55 % Final  . UIBC 08/02/2013 216  125 - 400 ug/dL Final   Performed at [facility]  . Vitamin B-12 08/02/2013 382  211 - 911 pg/mL Final   Performed at [facility]  . Ferritin 08/02/2013 142  10 - 291 ng/mL Final   Performed at [facility] Visit on 07/07/2013  Component Date Value Range Status  . Iron 07/07/2013 17* 42 - 135 ug/dL Final  . TIBC 54/07/8118 407  250 - 470 ug/dL Final  . Saturation Ratios 07/07/2013 4* 20 - 55 % Final  . UIBC 07/07/2013 390  125 - 400 ug/dL Final   Performed at [facility]  . Ferritin 07/07/2013 5* 10 - 291 ng/mL Final   Performed at [facility]  . Vitamin B-12 07/07/2013 301  211 - 911 pg/mL Final   Performed at [facility]  . Folate 07/07/2013 7.0   Final   Comment: (NOTE)                          Reference Ranges                                 Deficient:       0.4 - 3.3 ng/mL                                 Indeterminate:   3.4 - 5.4 ng/mL                                 Normal:              > 5.4 ng/mL                          Performed at [facility]  . WBC  07/07/2013 11.5* 4.0 - 10.5 K/uL Final  . RBC 07/07/2013 4.14  3.87 - 5.11 MIL/uL Final  . Hemoglobin 07/07/2013 11.8* 12.0 - 15.0 g/dL Final  . HCT 14/78/2956 35.8* 36.0 - 46.0 % Final  . MCV 07/07/2013 86.5  78.0 - 100.0 fL Final  . MCH 07/07/2013 28.5  26.0 - 34.0 pg Final  . MCHC 07/07/2013 33.0  30.0 - 36.0 g/dL Final  . RDW 21/30/8657 14.3  11.5 - 15.5 % Final  . Platelets 07/07/2013 408* 150 - 400 K/uL Final  . Neutrophils Relative % 07/07/2013 59  43 - 77 % Final  . Neutro Abs 07/07/2013 6.8  1.7 - 7.7 K/uL Final  . Lymphocytes Relative 07/07/2013 30  12 - 46 % Final  . Lymphs Abs 07/07/2013 3.5  0.7 - 4.0 K/uL Final  . Monocytes Relative 07/07/2013 4  3 - 12 % Final  . Monocytes Absolute 07/07/2013 0.5  0.1 - 1.0 K/uL Final  . Eosinophils Relative 07/07/2013 6* 0 - 5 % Final  . Eosinophils Absolute 07/07/2013 0.7  0.0 - 0.7 K/uL Final  .  Basophils Relative 07/07/2013 1  0 - 1 % Final  . Basophils Absolute 07/07/2013 0.1  0.0 - 0.1 K/uL Final  . Neutrophils Relative % 07/07/2013 59  43 - 77 % Final  . Lymphocytes Relative 07/07/2013 28  12 - 46 % Final  . Monocytes Relative 07/07/2013 8  3 - 12 % Final  . Eosinophils Relative 07/07/2013 4  0 - 5 % Final  . Basophils Relative 07/07/2013 1  0 - 1 % Final  . Band Neutrophils 07/07/2013 0  0 - 10 % Final  . Metamyelocytes Relative 07/07/2013 0   Final  . Myelocytes 07/07/2013 0   Final  . Promyelocytes Absolute 07/07/2013 0   Final  . Blasts 07/07/2013 0   Final  . nRBC 07/07/2013 0  0 /100 WBC Final  Office Visit on 07/06/2013  Component Date Value Range Status  . RPR 07/06/2013 NON REAC  NON REAC Final  . Hepatitis B Surface Ag 07/06/2013 NEGATIVE  NEGATIVE Final  . Hep B C IgM 07/06/2013 NEG  NEGATIVE Final   Comment: High levels of Hepatitis B Core IgM antibody are detectable                          during the acute stage of Hepatitis B. This antibody is used                          to differentiate current from past  HBV infection.                             . Hep A IgM 07/06/2013 NEG  NEGATIVE Final  . HCV Ab 07/06/2013 NEGATIVE  NEGATIVE Final  . Sodium 07/06/2013 141  135 - 145 mEq/L Final  . Potassium 07/06/2013 4.2  3.5 - 5.3 mEq/L Final  . Chloride 07/06/2013 107  96 - 112 mEq/L Final  . CO2 07/06/2013 23  19 - 32 mEq/L Final  . Glucose, Bld 07/06/2013 75  70 - 99 mg/dL Final  . BUN 16/08/9603 16  6 - 23 mg/dL Final  . Creat 54/07/8118 0.63  0.50 - 1.10 mg/dL Final  . Total Bilirubin 07/06/2013 0.2* 0.3 - 1.2 mg/dL Final  . Alkaline Phosphatase 07/06/2013 212* 39 - 117 U/L Final  . AST 07/06/2013 21  0 - 37 U/L Final  . ALT 07/06/2013 38* 0 - 35 U/L Final  . Total Protein 07/06/2013 5.9* 6.0 - 8.3 g/dL Final  . Albumin 14/78/2956 3.6  3.5 - 5.2 g/dL Final  . Calcium 21/30/8657 8.8  8.4 - 10.5 mg/dL Final  . GFR, Est African American 07/06/2013 >89   Final  . GFR, Est Non African American 07/06/2013 >89   Final   Comment:                            The estimated GFR is a calculation valid for adults (>=27 years old)                          that uses the CKD-EPI algorithm to adjust for age and sex. It is                            not to be used for children,  pregnant women, hospitalized patients,                             patients on dialysis, or with rapidly changing kidney function.                          According to the NKDEP, eGFR >89 is normal, 60-89 shows mild                          impairment, 30-59 shows moderate impairment, 15-29 shows severe                          impairment and <15 is ESRD.                             . WBC 07/06/2013 11.9* 4.0 - 10.5 K/uL Final  . RBC 07/06/2013 4.40  3.87 - 5.11 MIL/uL Final  . Hemoglobin 07/06/2013 12.0  12.0 - 15.0 g/dL Final  . HCT 96/02/5408 36.8  36.0 - 46.0 % Final  . MCV 07/06/2013 83.6  78.0 - 100.0 fL Final  . MCH 07/06/2013 27.3  26.0 - 34.0 pg Final  . MCHC 07/06/2013 32.6  30.0 - 36.0 g/dL Final  . RDW 81/19/1478 14.4   11.5 - 15.5 % Final  . Platelets 07/06/2013 407* 150 - 400 K/uL Final  . Neutrophils Relative % 07/06/2013 67  43 - 77 % Final  . Neutro Abs 07/06/2013 8.1* 1.7 - 7.7 K/uL Final  . Lymphocytes Relative 07/06/2013 22  12 - 46 % Final  . Lymphs Abs 07/06/2013 2.6  0.7 - 4.0 K/uL Final  . Monocytes Relative 07/06/2013 5  3 - 12 % Final  . Monocytes Absolute 07/06/2013 0.6  0.1 - 1.0 K/uL Final  . Eosinophils Relative 07/06/2013 5  0 - 5 % Final  . Eosinophils Absolute 07/06/2013 0.6  0.0 - 0.7 K/uL Final  . Basophils Relative 07/06/2013 1  0 - 1 % Final  . Basophils Absolute 07/06/2013 0.1  0.0 - 0.1 K/uL Final  . Smear Review 07/06/2013 Criteria for review not met   Final  . HIV 07/06/2013 NON REACTIVE  NON REACTIVE Final  . Tissue Transglutaminase Ab, IgA 07/06/2013 1.9  <20 U/mL Final   Comment:    Reference Range:                                  <20     Negative                                  20-25   Equivocal                                  >25     Positive                                                     Between 2-3% of Celiac patients  have selective IgA deficiency. If the                          tTG IgA result is negative but Celiac disease is still suspected,                          total IgA should be measured to identify possible selective IgA                          deficiency and to rule out a false negative. In cases of IgA                          deficiency, measurement of tTG IgG should be considered.                             . Tissue Transglut Ab 07/06/2013 3.5  <20 U/mL Final   Comment:    Reference Range:                                  <20     Negative                                  20-25   Equivocal                                  >25     Positive                             . Benzodiazepines. 07/06/2013 NEG  Negative Final  . Phencyclidine (PCP) 07/06/2013 NEG  Negative Final  . Cocaine Metabolites 07/06/2013 NEG  Negative Final  . Amphetamine  Screen, Ur 07/06/2013 POS* Negative Final   Result repeated and verified.  . Marijuana Metabolite 07/06/2013 NEG  Negative Final  . Opiates 07/06/2013 NEG  Negative Final  . Barbiturate Quant, Ur 07/06/2013 NEG  Negative Final  . Methadone 07/06/2013 NEG  Negative Final  . Propoxyphene 07/06/2013 NEG  Negative Final  . Creatinine,U 07/06/2013 206.91   Final   Comment:                             Cutoff Values for Urine Drug Screen, Pain Mgmt                                   Drug Class           Cutoff (ng/mL)                                   Amphetamines             500                                   Barbiturates  200                                   Cocaine Metabolites      150                                   Benzodiazepines          200                                   Methadone                300                                   Opiates                  300                                   Phencyclidine             25                                   Propoxyphene             300                                   Marijuana Metabolites     50                                                      For medical purposes only.    PATHOLOGY: Spongiotic dermatitis (06/09/2013)   Urinalysis    Component Value Date/Time   COLORURINE YELLOW 12/17/2006 0411   APPEARANCEUR CLEAR 12/17/2006 0411   LABSPEC 1.015 12/17/2006 0411   PHURINE 7.5 12/17/2006 0411   BILIRUBINUR NEG 12/17/2006 0411   KETONESUR NEG mg/dL 5/63/8756 4332   PROTEINUR NEG mg/dL 9/51/8841 6606   UROBILINOGEN 0.2 12/17/2006 0411   NITRITE NEG 12/17/2006 0411   LEUKOCYTESUR NEG 12/17/2006 0411    RADIOGRAPHIC STUDIES: No results found.  ASSESSMENT: #1. Iron deficiency along with B12 deficiency and vitamin D deficiency secondary to previous gastric bypass surgery. #2. Positive Strongyloides antibody titer plus rash, for examination of (sites in the stool with stool culture and sensitivity. #3. 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 Stool for ova and parasites as well as culture and sensitivity. #2. Reassurance for now but this in fact may be a psychological disorder #3. Followup in 2 weeks.   All questions were answered. The patient knows to call the clinic with any problems, questions or concerns. We can certainly see the patient much sooner if necessary.   I spent 40  minutes counseling the patient face to face. The total time spent in the appointment was 30 minutes.    Maurilio Lovely, MD 08/04/2013 9:36 AM

## 2013-08-07 ENCOUNTER — Telehealth (HOSPITAL_COMMUNITY): Payer: Self-pay | Admitting: Hematology and Oncology

## 2013-08-07 LAB — STRONGYLOIDES ANTIBODY: Strongyloides Ab: NEGATIVE

## 2013-08-07 NOTE — Telephone Encounter (Signed)
This encounter was created in error - please disregard.

## 2013-08-08 ENCOUNTER — Encounter (HOSPITAL_COMMUNITY): Payer: Self-pay

## 2013-08-08 DIAGNOSIS — R760 Raised antibody titer: Secondary | ICD-10-CM

## 2013-08-09 ENCOUNTER — Other Ambulatory Visit (HOSPITAL_COMMUNITY): Payer: Self-pay | Admitting: Hematology and Oncology

## 2013-08-09 LAB — OVA AND PARASITE EXAMINATION: Ova and parasites: NONE SEEN

## 2013-08-10 ENCOUNTER — Other Ambulatory Visit (HOSPITAL_COMMUNITY): Payer: Self-pay | Admitting: Hematology and Oncology

## 2013-08-10 MED ORDER — IVERMECTIN 3 MG PO TABS: 12.0000 mg | ORAL_TABLET | Freq: Once | ORAL | Status: DC

## 2013-08-16 ENCOUNTER — Telehealth: Payer: Self-pay

## 2013-08-16 ENCOUNTER — Other Ambulatory Visit: Payer: Self-pay | Admitting: Family Medicine

## 2013-08-16 DIAGNOSIS — B839 Helminthiasis, unspecified: Secondary | ICD-10-CM

## 2013-08-16 LAB — STOOL CULTURE

## 2013-08-16 NOTE — Telephone Encounter (Signed)
Luann and Dr Lodema Hong called and spoke with patients mother

## 2013-08-16 NOTE — Telephone Encounter (Signed)
pls let mother know that I am trying to get an appt at [facility] ID clinic asap, and will let her know as soon as we have an appt date. Let her know I am concerned

## 2013-08-16 NOTE — Telephone Encounter (Signed)
I spoke directly with patient's mother Clifton Custard. She stated that" Alejandra Barr is going to die" if she does not get the care that she needs. She states that she was told that the infestation of worms is involving all of her organs, and she is seeing worms in menstrual blood, coming out of her skin , in her stool. I had tried to get an appt at [facility] ID clionic per request of family, and earliest available is in 2015. Darl Pikes states that medication Eulala is receiving is going to be administered on a weekly basis for 1 year, and her current condition is such that she needs to be hospitalized. I spoke directly with the Ed [employer], explained that I was sending pt to the ED based on mother's words that she would die if not admitted, and also that the diagnosis  Was recently made , despite multiple presentations to several different doctors and clinics in the past 6 to 8 weeks. Ms Laurine Blazer requested that the cancer center staff be notified , so that "Marrian would get the right care that she needed.' I am putting in a repeat call to staff there, I had spoken earlier with Vickey Sages to make him aware of the current situation

## 2013-08-16 NOTE — Telephone Encounter (Signed)
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 They have infected her organs as well and after receiving a 6 day course of meds from [facility] the worms are crawling out everywhere and her mother is very concerned as Arlesia has lost 45 lbs and is very anemic and wants to get her into infectious disease at [facility] asap and wants a call from Dr

## 2013-08-17 ENCOUNTER — Encounter (HOSPITAL_COMMUNITY): Payer: Self-pay | Admitting: Emergency Medicine

## 2013-08-17 ENCOUNTER — Encounter (HOSPITAL_COMMUNITY): Payer: Self-pay

## 2013-08-17 ENCOUNTER — Emergency Department (HOSPITAL_COMMUNITY)
Admission: EM | Admit: 2013-08-17 | Discharge: 2013-08-17 | Disposition: A | Discharge status: Home / Self Care | Payer: Managed Care, Other (non HMO) | Attending: Emergency Medicine | Admitting: Emergency Medicine

## 2013-08-17 DIAGNOSIS — Z8639 Personal history of other endocrine, nutritional and metabolic disease: Secondary | ICD-10-CM | POA: Insufficient documentation

## 2013-08-17 DIAGNOSIS — F329 Major depressive disorder, single episode, unspecified: Secondary | ICD-10-CM | POA: Insufficient documentation

## 2013-08-17 DIAGNOSIS — F411 Generalized anxiety disorder: Secondary | ICD-10-CM | POA: Insufficient documentation

## 2013-08-17 DIAGNOSIS — B999 Unspecified infectious disease: Secondary | ICD-10-CM | POA: Insufficient documentation

## 2013-08-17 DIAGNOSIS — Z79899 Other long term (current) drug therapy: Secondary | ICD-10-CM | POA: Insufficient documentation

## 2013-08-17 DIAGNOSIS — Z880 Allergy status to penicillin: Secondary | ICD-10-CM | POA: Insufficient documentation

## 2013-08-17 DIAGNOSIS — F17200 Nicotine dependence, unspecified, uncomplicated: Secondary | ICD-10-CM | POA: Insufficient documentation

## 2013-08-17 DIAGNOSIS — Z862 Personal history of diseases of the blood and blood-forming organs and certain disorders involving the immune mechanism: Secondary | ICD-10-CM | POA: Insufficient documentation

## 2013-08-17 DIAGNOSIS — K5900 Constipation, unspecified: Secondary | ICD-10-CM | POA: Insufficient documentation

## 2013-08-17 DIAGNOSIS — B89 Unspecified parasitic disease: Secondary | ICD-10-CM

## 2013-08-17 DIAGNOSIS — R109 Unspecified abdominal pain: Secondary | ICD-10-CM | POA: Insufficient documentation

## 2013-08-17 DIAGNOSIS — J45909 Unspecified asthma, uncomplicated: Secondary | ICD-10-CM | POA: Insufficient documentation

## 2013-08-17 DIAGNOSIS — Z9889 Other specified postprocedural states: Secondary | ICD-10-CM | POA: Insufficient documentation

## 2013-08-17 DIAGNOSIS — F3289 Other specified depressive episodes: Secondary | ICD-10-CM | POA: Insufficient documentation

## 2013-08-17 NOTE — ED Notes (Addendum)
Has been having puritic, stinging  rash over body since May.  Has been seen by multiple MDs trying to diagnose.  Has pulled "wormlike" structures out of these sites.  Has been having iron transfusions regularly at [facility] and had labs drawn for antibodies at that time.   Showed Strongyloides antibody and was treated w/Stremectol 3mg  1x dose. Prior labs in system show Strongyloides antibody is negative; however patient's printout from My Chart shows abnormal result.  Initially stated it was "a [place] doc in a box" that made diagnosis, then later stated it was MD from cancer center.  Afterward began having stools w/worms in it.  Patient has pictures on her phone of worms she states she has pulled from her body and have come out of stool but does not have any samples she saved.  PMD stated she should have follow-up treatment x 6 mos.  Stool O& P on 10/3 was negative.  Notes from prior MD office visits somewhat contradictory to timeline given by patient.

## 2013-08-17 NOTE — Telephone Encounter (Signed)
Patient's mother called at 02:15 in the morning stating that Yaritzi had no appt at [facility], that it was only her husbanmnd, that she was having difficulty breathing and her heart was beating fast. Again I advised ED evaluation since she feklt she was unstable and had earlier said she was dying if not hospitalized. Pt's mother opted to "wait until the morning"ibuprofen spoke directly again with the Ed Doc present (anotherone) Dr Lynelle Doctor, gave her a brief history of the pt, and advised that she would be coming in. I then called Ms Laurine Blazer who had been calling in for the patient and advised her that the Ed was expecting them, she thanked me and said that she would send a text message to [facility] and meet her at the ED

## 2013-08-17 NOTE — ED Provider Notes (Signed)
CSN: [id number]     Arrival date & time 08/17/13  1604 History  This chart was scribed for Shelda Jakes, MD by Danella Maiers, ED Scribe. This patient was seen in room APA03/APA03 and the patient's care was started at 5:34 PM.   Chief Complaint  Patient presents with  . Rash  . parasitic effection    Patient is a 27 y.o. female presenting with rash. The history is provided by the patient. No language interpreter was used.  Rash Location:  Full body Quality: redness   Severity:  Mild Associated symptoms: abdominal pain   Associated symptoms: no diarrhea, no fever, no nausea, no sore throat and not vomiting    HPI Comments: [facility] is a 27 y.o. female with h/o chronic anxiety, chronic depression, iron deficiency who presents to the Emergency Department complaining of generalized stinging, burning rash from a suspected parasitic infection that started four months ago. She reports finding worms in her stool. She was treated for strongyloids by a specialist with a one-time dose of Stromectol 12 days ago with no relief. She has a f/u with them tomorrow. She was told to come here by her PCP Dr Lodema Hong. She notes she has been to 10-11 doctors for the same problem. She also reports abdominal pain from having gastric bypass surgery recently.  Past Medical History  Diagnosis Date  . Asthma   . Chronic anxiety   . Chronic depression   . Obesity   . Iron deficiency    Past Surgical History  Procedure Laterality Date  . Cholecystectomy  2003  . Tonsillectomy  1990  . Bilateral eustachian tube placement on 4 different occasions    . Cardiac ablation    . Gastric bypass  2006   Family History  Problem Relation Age of Onset  . Fibromyalgia Mother     Chronic pelvic pain   . Breast cancer Mother   . Hypothyroidism Mother   . Obesity Mother   . Heart defect Mother     supraventricular, tachycardia   . Heart defect Mother     tachypalpation    History  Substance Use Topics   . Smoking status: Current Every Day Smoker -- 1.00 packs/day  . Smokeless tobacco: Never Used  . Alcohol Use: No   OB History   Grav Para Term Preterm Abortions TAB SAB Ect Mult Living                 Review of Systems  Constitutional: Negative for fever and chills.  HENT: Negative for rhinorrhea and sore throat.   Eyes: Negative for visual disturbance.  Respiratory: Negative for cough.   Cardiovascular: Negative for chest pain and leg swelling.  Gastrointestinal: Positive for abdominal pain and constipation. Negative for nausea, vomiting and diarrhea.  Skin: Positive for rash.  Psychiatric/Behavioral: Negative for confusion.  All other systems reviewed and are negative.    Allergies  Ciprofloxacin; Morphine; and Penicillins  Home Medications   Current Outpatient Rx  Name  Route  Sig  Dispense  Refill  . albuterol (PROVENTIL HFA;VENTOLIN HFA) 108 (90 BASE) MCG/ACT inhaler   Inhalation   Inhale 2 puffs into the lungs every 6 (six) hours as needed for wheezing.   18 g   0   . flintstones complete (FLINTSTONES) 60 MG chewable tablet   Oral   Chew 1 tablet by mouth 2 (two) times daily.          . metoprolol succinate (TOPROL XL) 25 MG  24 hr tablet   Oral   Take 1 tablet (25 mg total) by mouth daily.   30 tablet   2   . PARoxetine (PAXIL) 10 MG tablet   Oral   Take 1 tablet (10 mg total) by mouth daily.   30 tablet   3   . triamcinolone ointment (KENALOG) 0.5 %   Topical   Apply topically 2 (two) times daily.   30 g   3   . phentermine (ADIPEX-P) 37.5 MG tablet   Oral   Take 37.5 mg by mouth daily before breakfast.         . Vitamin D, Ergocalciferol, (DRISDOL) 50000 UNITS CAPS capsule   Oral   Take 50,000 Units by mouth every 30 (thirty) days. Around the 15th of the month          BP 111/58  Pulse 88  Temp(Src) 98.7 F (37.1 C) (Oral)  Resp 14  SpO2 99%  LMP 08/14/2013 Physical Exam  Nursing note and vitals reviewed. Constitutional: She  is oriented to person, place, and time. She appears well-developed and well-nourished. No distress.  HENT:  Head: Normocephalic and atraumatic.  Eyes: EOM are normal. No scleral icterus.  Neck: Neck supple. No tracheal deviation present.  Cardiovascular: Normal rate and regular rhythm.   Pulmonary/Chest: Effort normal and breath sounds normal. No respiratory distress.  Abdominal: Bowel sounds are normal. There is no tenderness.  Musculoskeletal: Normal range of motion.  Neurological: She is alert and oriented to person, place, and time.  Skin: Skin is warm and dry. Rash noted.  Spherical erythematous to deep purplish lesions without any secondary infections.  Psychiatric: She has a normal mood and affect. Her behavior is normal.    ED Course  Procedures (including critical care time) Medications - No data to display  DIAGNOSTIC STUDIES: Oxygen Saturation is 99% on RA, normal by my interpretation.    COORDINATION OF CARE: 6:06 PM- Discussed treatment plan with pt. Pt agrees to plan.     EKG Interpretation   None       MDM   1. Parasitic infection    Patient has been followed by hematology oncology clinic in the past has been treated for a parasitic infection is due for retreatment has an appointment with them tomorrow. Patient got the impression from her primary care Dr. that she was to come here for admission. Were unable to get a hold of her primary care Dr. Patient clinically looks well nontoxic no acute distress no vital signs abnormalities no fever. Patient and I both agree that she can probably wait for her followup with hematology oncology tomorrow and then further treatment can be determined at that time.    I personally performed the services described in this documentation, which was scribed in my presence. The recorded information has been reviewed and is accurate.     Shelda Jakes, MD 08/17/13 1902

## 2013-08-18 ENCOUNTER — Encounter (HOSPITAL_BASED_OUTPATIENT_CLINIC_OR_DEPARTMENT_OTHER): Payer: Private Health Insurance - Indemnity

## 2013-08-18 ENCOUNTER — Encounter (HOSPITAL_COMMUNITY): Payer: Self-pay

## 2013-08-18 VITALS — BP 118/79 | HR 100 | Temp 99.0°F | Resp 18 | Wt 172.9 lb

## 2013-08-18 DIAGNOSIS — R760 Raised antibody titer: Secondary | ICD-10-CM

## 2013-08-18 DIAGNOSIS — B789 Strongyloidiasis, unspecified: Secondary | ICD-10-CM

## 2013-08-18 DIAGNOSIS — D509 Iron deficiency anemia, unspecified: Secondary | ICD-10-CM

## 2013-08-18 DIAGNOSIS — E538 Deficiency of other specified B group vitamins: Secondary | ICD-10-CM

## 2013-08-18 DIAGNOSIS — B781 Cutaneous strongyloidiasis: Secondary | ICD-10-CM | POA: Insufficient documentation

## 2013-08-18 LAB — CBC WITH DIFFERENTIAL/PLATELET
Basophils Absolute: 0.1 10*3/uL (ref 0.0–0.1)
Basophils Relative: 1 % (ref 0–1)
Eosinophils Absolute: 0.6 10*3/uL (ref 0.0–0.7)
Eosinophils Relative: 5 % (ref 0–5)
HCT: 38.6 % (ref 36.0–46.0)
Hemoglobin: 12.8 g/dL (ref 12.0–15.0)
Lymphocytes Relative: 20 % (ref 12–46)
Lymphs Abs: 2.2 10*3/uL (ref 0.7–4.0)
MCH: 29.8 pg (ref 26.0–34.0)
MCHC: 33.2 g/dL (ref 30.0–36.0)
MCV: 90 fL (ref 78.0–100.0)
Monocytes Absolute: 0.4 10*3/uL (ref 0.1–1.0)
Monocytes Relative: 4 % (ref 3–12)
Neutro Abs: 7.8 10*3/uL — ABNORMAL HIGH (ref 1.7–7.7)
Neutrophils Relative %: 71 % (ref 43–77)
Platelets: 314 10*3/uL (ref 150–400)
RBC: 4.29 MIL/uL (ref 3.87–5.11)
RDW: 17.2 % — ABNORMAL HIGH (ref 11.5–15.5)
WBC: 11 10*3/uL — ABNORMAL HIGH (ref 4.0–10.5)

## 2013-08-18 LAB — OVA AND PARASITE EXAMINATION: Ova and parasites: NONE SEEN

## 2013-08-18 MED ORDER — IVERMECTIN 3 MG PO TABS: 12.0000 mg | ORAL_TABLET | Freq: Once | ORAL | Status: DC

## 2013-08-18 NOTE — Progress Notes (Signed)
[facility] OFFICE PROGRESS NOTE  [name], MD [address] [place] [address]  DIAGNOSIS: Abnormal antibody titer--Strongyloides - Plan: Strongyloides antibody, Strongyloides antibody  Low vitamin B12 level - Plan: CBC with Differential, CBC with Differential  Iron deficiency anemia, unspecified  Cutaneous strongyloidiasis - Plan: Strongyloides antibody, HTLV 1+2 antibodies, (EIA), bld, Strongyloides antibody, HTLV 1+2 antibodies, (EIA), bld  Chief Complaint  Patient presents with  . strongyloidiasis  . Anemia    iron deficiency    CURRENT THERAPY: Status post perineum infusion, status post 12 mg of ivermectin  INTERVAL HISTORY: Alejandra Barr 27 y.o. 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 She developed loose bowel movements with no area form of the organism in her stool. Skin lesions have improved dramatically. She denies a cough, wheezing, PND, orthopnea, or palpitations. She also denies any headache but has been constipated now for the past week. She also saw the organisms in her menstrual period. She denies any fever, night sweats, lower extremity swelling or redness there is worsening, headache, or seizures.   MEDICAL HISTORY: Past Medical History  Diagnosis Date  . Asthma   . Chronic anxiety   . Chronic depression   . Obesity   . Iron deficiency     INTERIM HISTORY: has OBESITY, UNSPECIFIED; iron deficiency anemia malabsorption; BIPOLAR II DISORDER; ANXIETY, CHRONIC; NICOTINE ADDICTION; CLUSTER HEADACHE; ATRIAL FLUTTER; ASTHMA, UNSPECIFIED, UNSPECIFIED STATUS; PAIN IN JOINT, MULTIPLE SITES; PALPITATIONS; Depression; Vitamin D deficiency; Insomnia; Rash and nonspecific skin eruption; GAD (generalized anxiety disorder); Low vitamin B12 level; Abnormal antibody titer--Strongyloides; and Cutaneous strongyloidiasis on her problem list.     ALLERGIES:  is allergic to ciprofloxacin; morphine; and penicillins.  MEDICATIONS: has a current medication list which includes the following prescription(s): albuterol, docusate sodium, flintstones complete, metoprolol succinate, paroxetine, phentermine, triamcinolone ointment, vitamin d (ergocalciferol), and ivermectin.  SURGICAL HISTORY:  Past Surgical History  Procedure Laterality Date  . Cholecystectomy  2003  . Tonsillectomy  1990  . Bilateral eustachian tube placement on 4 different occasions    . Cardiac ablation    . Gastric bypass  2006    FAMILY HISTORY: family history includes Breast cancer in her mother; Fibromyalgia in her mother; Heart defect in her mother and mother; Hypothyroidism in her mother; Obesity in her mother.  SOCIAL HISTORY:  reports that she has been smoking.  She has never used smokeless tobacco. She reports that she does not drink alcohol or use illicit drugs.  REVIEW OF SYSTEMS:  Other than that discussed above is noncontributory.  PHYSICAL EXAMINATION: ECOG PERFORMANCE STATUS: 1 - Symptomatic but completely ambulatory  Blood pressure 118/79, pulse 100, temperature 99 F (37.2 C), temperature source Oral, resp. rate 18, weight 172 lb 14.4 oz (78.427 kg), last menstrual period 08/14/2013.  GENERAL:alert, no distress and comfortable SKIN: skin color, texture, turgor are normal, no rashes or significant lesions. No new skin rashes with healing of previous rashes that were diffusely present on her upper extremities as well as thorax. Slight areas of hyperpigmentation where the rash previously existed. EYES: PERLA; Conjunctiva are pink and non-injected, sclera clear OROPHARYNX:no exudate, no erythema on lips, buccal mucosa, or tongue. NECK: supple, thyroid normal size, non-tender, without nodularity. No masses CHEST: Normal AP diameter with no breast masses. LYMPH:  no palpable lymphadenopathy in the cervical, axillary or inguinal LUNGS: clear to  auscultation and percussion with normal breathing effort  HEART: regular rate & rhythm and no murmurs and no lower extremity edema ABDOMEN:abdomen soft, non-tender and normal bowel sounds MUSCULOSKELETAL:no cyanosis of digits and no clubbing. Range of motion normal.  NEURO: alert & oriented x 3 with fluent speech, no focal motor/sensory deficits   LABORATORY DATA: Office Visit on 08/18/2013  Component Date Value Range Status  . WBC 08/18/2013 11.0* 4.0 - 10.5 K/uL Final  . RBC 08/18/2013 4.29  3.87 - 5.11 MIL/uL Final  . Hemoglobin 08/18/2013 12.8  12.0 - 15.0 g/dL Final  . HCT 40/98/1191 38.6  36.0 - 46.0 % Final  . MCV 08/18/2013 90.0  78.0 - 100.0 fL Final  . MCH 08/18/2013 29.8  26.0 - 34.0 pg Final  . MCHC 08/18/2013 33.2  30.0 - 36.0 g/dL Final  . RDW 47/82/9562 17.2* 11.5 - 15.5 % Final  . Platelets 08/18/2013 314  150 - 400 K/uL Final  . Neutrophils Relative % 08/18/2013 71  43 - 77 % Final  . Neutro Abs 08/18/2013 7.8* 1.7 - 7.7 K/uL Final  . Lymphocytes Relative 08/18/2013 20  12 - 46 % Final  . Lymphs Abs 08/18/2013 2.2  0.7 - 4.0 K/uL Final  . Monocytes Relative 08/18/2013 4  3 - 12 % Final  . Monocytes Absolute 08/18/2013 0.4  0.1 - 1.0 K/uL Final  . Eosinophils Relative 08/18/2013 5  0 - 5 % Final  . Eosinophils Absolute 08/18/2013 0.6  0.0 - 0.7 K/uL Final  . Basophils Relative 08/18/2013 1  0 - 1 % Final  . Basophils Absolute 08/18/2013 0.1  0.0 - 0.1 K/uL Final  Office Visit on 08/04/2013  Component Date Value Range Status  . Specimen Description 08/04/2013 STOOL   Final  . Special Requests 08/04/2013 NONE   Final  . Ova and parasites 08/04/2013    Final                   Value:NO OVA OR PARASITES SEEN                         Performed at [facility]  . Report Status 08/04/2013 08/09/2013 FINAL   Final  . Specimen Description 08/12/2013 STOOL   Final  . Special Requests 08/12/2013 NONE   Final  . Culture 08/12/2013    Final                   Value:NO  SALMONELLA, SHIGELLA, CAMPYLOBACTER, YERSINIA, OR E.COLI 0157:[id number] ISOLATED                         Performed at [facility]  . Report Status 08/12/2013 08/16/2013 FINAL   Final  . Specimen Description 08/12/2013 STOOL   Final  . Special Requests 08/12/2013 NONE   Final  . Ova and parasites 08/12/2013    Final                   Value:NO OVA OR PARASITES SEEN                         Performed at [facility]  . Report Status 08/12/2013 08/18/2013 FINAL   Final  Infusion on 08/02/2013  Component Date Value Range Status  . Sodium 08/02/2013 139  135 - 145 mEq/L Final  . Potassium 08/02/2013 4.1  3.5 - 5.1 mEq/L Final  . Chloride 08/02/2013 106  96 -  112 mEq/L Final  . CO2 08/02/2013 23  19 - 32 mEq/L Final  . Glucose, Bld 08/02/2013 79  70 - 99 mg/dL Final  . BUN 45/40/9811 11  6 - 23 mg/dL Final  . Creatinine, Ser 08/02/2013 0.73  0.50 - 1.10 mg/dL Final  . Calcium 91/47/8295 9.3  8.4 - 10.5 mg/dL Final  . Total Protein 08/02/2013 6.6  6.0 - 8.3 g/dL Final  . Albumin 62/13/0865 3.6  3.5 - 5.2 g/dL Final  . AST 78/46/9629 19  0 - 37 U/L Final  . ALT 08/02/2013 19  0 - 35 U/L Final  . Alkaline Phosphatase 08/02/2013 114  39 - 117 U/L Final  . Total Bilirubin 08/02/2013 0.2* 0.3 - 1.2 mg/dL Final  . GFR calc non Af Amer 08/02/2013 >90  >90 mL/min Final  . GFR calc Af Amer 08/02/2013 >90  >90 mL/min Final   Comment: (NOTE)                          The eGFR has been calculated using the CKD EPI equation.                          This calculation has not been validated in all clinical situations.                          eGFR's persistently <90 mL/min signify possible Chronic Kidney                          Disease.  . WBC 08/02/2013 6.7  4.0 - 10.5 K/uL Final  . RBC 08/02/2013 4.15  3.87 - 5.11 MIL/uL Final  . Hemoglobin 08/02/2013 12.1  12.0 - 15.0 g/dL Final  . HCT 52/84/1324 37.3  36.0 - 46.0 % Final  . MCV 08/02/2013 89.9  78.0 - 100.0 fL Final  . MCH 08/02/2013 29.2   26.0 - 34.0 pg Final  . MCHC 08/02/2013 32.4  30.0 - 36.0 g/dL Final  . RDW 40/08/2724 17.6* 11.5 - 15.5 % Final  . Platelets 08/02/2013 206  150 - 400 K/uL Final  . Neutrophils Relative % 08/02/2013 54  43 - 77 % Final  . Neutro Abs 08/02/2013 3.6  1.7 - 7.7 K/uL Final  . Lymphocytes Relative 08/02/2013 34  12 - 46 % Final  . Lymphs Abs 08/02/2013 2.2  0.7 - 4.0 K/uL Final  . Monocytes Relative 08/02/2013 5  3 - 12 % Final  . Monocytes Absolute 08/02/2013 0.4  0.1 - 1.0 K/uL Final  . Eosinophils Relative 08/02/2013 6* 0 - 5 % Final  . Eosinophils Absolute 08/02/2013 0.4  0.0 - 0.7 K/uL Final  . Basophils Relative 08/02/2013 1  0 - 1 % Final  . Basophils Absolute 08/02/2013 0.1  0.0 - 0.1 K/uL Final  . Iron 08/02/2013 90  42 - 135 ug/dL Final  . TIBC 36/64/4034 306  250 - 470 ug/dL Final  . Saturation Ratios 08/02/2013 29  20 - 55 % Final  . UIBC 08/02/2013 216  125 - 400 ug/dL Final   Performed at [facility]  . Vitamin B-12 08/02/2013 382  211 - 911 pg/mL Final   Performed at [facility]  . Ferritin 08/02/2013 142  10 - 291 ng/mL Final   Performed at [facility]  . Strongyloides Ab 08/02/2013 NEGATIVE  Final   Comment: (NOTE)                          REFERENCE RANGE:  NEGATIVE                          Strongyloides stercoralis is a parasitic                          nematode found in tropical and subtropical                          regions.  Because of low larval densities in                          feces, stool examination is a relatively                          insensitive diagnostic test; antibody detection                          offers increased sensitivity. Patients with                          latent infections who are immunosuppressed or                          receiving immunosuppressive therapy are at risk                          of life-threatening hyperinfection. Significant                          crossreactivity may be observed in  other                          helminth infections.                          Test performed by [facility]                                           [address]                                           [place], [place] [address]                                           Phone: [phone]                          Medical Director: Fransico Him. Pesano, M.D.  Director of Laboratories                          Test Reported by Clerance Lav,                          [facility],                          [address], [place], [place] [address]                          Jennet Maduro, M.D., Ph.D., Director of Laboratories                          [phone], CLIA [id number]                          Performed at [facility]    PATHOLOGY:  Urinalysis    Component Value Date/Time   COLORURINE YELLOW 12/17/2006 0411   APPEARANCEUR CLEAR 12/17/2006 0411   LABSPEC 1.015 12/17/2006 0411   PHURINE 7.5 12/17/2006 0411   BILIRUBINUR NEG 12/17/2006 0411   KETONESUR NEG mg/dL 12/02/8655 8469   PROTEINUR NEG mg/dL 05/01/5283 1324   UROBILINOGEN 0.2 12/17/2006 0411   NITRITE NEG 12/17/2006 0411   LEUKOCYTESUR NEG 12/17/2006 0411    RADIOGRAPHIC STUDIES: No results found.  ASSESSMENT: #1. 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 #2. Iron deficiency, status post gastric bypass surgery, status post perineum infusion with normalization of CBC.   PLAN: #1. Repeat CBC, Strongyloides antibody rtiteer today along with HTLV-I titer in case ivermectin is not completely effective. #2. When the patient and she is infectious and that her underclothes and other closure be watched separately at home. #3. Her father was present along with the patient today and expressed an understanding of this strategy used in her management. #4. Followup in 2 weeks with CBC and repeat from a waist titer.   All questions were answered.  The patient knows to call the clinic with any problems, questions or concerns. We can certainly see the patient much sooner if necessary.   I spent 25 minutes counseling the patient face to face. The total time spent in the appointment was 30 minutes.    Maurilio Lovely, MD 08/18/2013 11:41 AM

## 2013-08-18 NOTE — Telephone Encounter (Signed)
Patient's mother called on the evening of 10/16 after pt had been evaluated in the evening that evening. She was not present at the visit, but her immediate reaction was "they did not admit Alejandra Barr" and her heart is racing and she cannot breathe. I am scared that the parasites are in all her organs. She needs hospitalization I had spoken with the ED Doc before speaking with Ms Alejandra Barr. I assured Ms Alejandra Barr that if Alejandra Barr had rapid heart rate and evidence of respiratory distress she would have been admitted.  I encouraged her strongly to be present at the visit in the morning with the hematology clinic where treatment was initiated, to get a better understanding as to exactly where Alejandra Barr was as far as her clinical condition and the management protocol. She agreed that this was the most appropriate course of action, and though ill herself , she intends to be at the visit. To the present time, I have had no direct telephone call/conversation about the new dx, Alejandra Barr states she has HCPOA over Alejandra Barr.

## 2013-08-18 NOTE — Patient Instructions (Signed)
[facility] Discharge Instructions  RECOMMENDATIONS MADE BY THE CONSULTANT AND ANY TEST RESULTS WILL BE SENT TO YOUR REFERRING PHYSICIAN.  EXAM FINDINGS BY THE PHYSICIAN TODAY AND SIGNS OR SYMPTOMS TO REPORT TO CLINIC OR PRIMARY PHYSICIAN: Exam and findings as discussed by Dr. Zigmund Daniel.  Will treat you again with the Ivermectin.  MEDICATIONS PRESCRIBED:  Ivermectin - take as directed.  INSTRUCTIONS/FOLLOW-UP: Follow-up in 2 weeks.  Thank you for choosing [facility] to provide your oncology and hematology care.  To afford each patient quality time with our providers, please arrive at least 15 minutes before your scheduled appointment time.  With your help, our goal is to use those 15 minutes to complete the necessary work-up to ensure our physicians have the information they need to help with your evaluation and healthcare recommendations.    Effective January 1st, 2014, we ask that you re-schedule your appointment with our physicians should you arrive 10 or more minutes late for your appointment.  We strive to give you quality time with our providers, and arriving late affects you and other patients whose appointments are after yours.    Again, thank you for choosing [facility].  Our hope is that these requests will decrease the amount of time that you wait before being seen by our physicians.       _____________________________________________________________  Should you have questions after your visit to [facility], please contact our office at [phone] between the hours of 8:30 a.m. and 5:00 p.m.  Voicemails left after 4:30 p.m. will not be returned until the following business day.  For prescription refill requests, have your pharmacy contact our office with your prescription refill request.

## 2013-08-22 LAB — HTLV I+II ANTIBODIES, (EIA), BLD: HTLV I/II Ab: NONREACTIVE

## 2013-08-23 ENCOUNTER — Ambulatory Visit: Payer: Medicare HMO | Admitting: Family Medicine

## 2013-08-25 LAB — STRONGYLOIDES ANTIBODY: Strongyloides Ab: NEGATIVE

## 2013-08-30 ENCOUNTER — Encounter (HOSPITAL_BASED_OUTPATIENT_CLINIC_OR_DEPARTMENT_OTHER): Payer: Private Health Insurance - Indemnity

## 2013-08-30 ENCOUNTER — Encounter (HOSPITAL_COMMUNITY): Payer: Self-pay

## 2013-08-30 VITALS — BP 121/77 | HR 111 | Temp 98.8°F | Resp 16 | Wt 173.8 lb

## 2013-08-30 DIAGNOSIS — B781 Cutaneous strongyloidiasis: Secondary | ICD-10-CM

## 2013-08-30 DIAGNOSIS — B789 Strongyloidiasis, unspecified: Secondary | ICD-10-CM

## 2013-08-30 DIAGNOSIS — D509 Iron deficiency anemia, unspecified: Secondary | ICD-10-CM

## 2013-08-30 MED ORDER — IVERMECTIN 3 MG PO TABS: 12.0000 mg | ORAL_TABLET | Freq: Once | ORAL | Status: DC

## 2013-08-30 MED ORDER — ALPRAZOLAM 0.5 MG PO TABS: 0.5000 mg | ORAL_TABLET | Freq: Every evening | ORAL | Status: DC | PRN

## 2013-08-30 MED ORDER — METOCLOPRAMIDE HCL 10 MG PO TABS: 5.0000 mg | ORAL_TABLET | Freq: Four times a day (QID) | ORAL | Status: DC

## 2013-08-30 NOTE — Progress Notes (Signed)
[facility]  OFFICE PROGRESS NOTE  Alejandra Overman, MD [address] [place] [address]  DIAGNOSIS: iron deficiency anemia malabsorption  Cutaneous strongyloidiasis  Chief Complaint  Patient presents with  . Rash    CURRENT THERAPY: Ivermectin 12 mg for 2 doses at two-week intervals.  INTERVAL HISTORY: Alejandra Barr 27 y.o. female returns for followup of systemic Strongyloidosis having taken Ivermectin 12 mg for 2 dose 2 week intervals with the last treatment  on 08/18/2013. Intravenous Feraheme on 07/12/2013.  She has developed 2 new lesions on her upper extremity on the right and still has intermittent constipation relieved by MiraLax. She is excreting gelatinous material parenteral and is having further deterioration of her teeth. She denies any cough or wheezing but has had abdominal pain with distention and bloating. She is extremely anxious and is having difficulty sleeping  MEDICAL HISTORY: Past Medical History  Diagnosis Date  . Asthma   . Chronic anxiety   . Chronic depression   . Obesity   . Iron deficiency     INTERIM HISTORY: has OBESITY, UNSPECIFIED; iron deficiency anemia malabsorption; BIPOLAR II DISORDER; ANXIETY, CHRONIC; NICOTINE ADDICTION; CLUSTER HEADACHE; ATRIAL FLUTTER; ASTHMA, UNSPECIFIED, UNSPECIFIED STATUS; PAIN IN JOINT, MULTIPLE SITES; PALPITATIONS; Depression; Vitamin D deficiency; Insomnia; Rash and nonspecific skin eruption; GAD (generalized anxiety disorder); Low vitamin B12 level; Abnormal antibody titer--Strongyloides; and Cutaneous strongyloidiasis on her problem list.    ALLERGIES:  is allergic to ciprofloxacin; morphine; and penicillins.  MEDICATIONS: has a current medication list which includes the following prescription(s): albuterol, docusate sodium, flintstones complete, ivermectin, metoprolol succinate, paroxetine, phentermine, triamcinolone ointment, and vitamin d  (ergocalciferol).  SURGICAL HISTORY:  Past Surgical History  Procedure Laterality Date  . Cholecystectomy  2003  . Tonsillectomy  1990  . Bilateral eustachian tube placement on 4 different occasions    . Cardiac ablation    . Gastric bypass  2006    FAMILY HISTORY: family history includes Breast cancer in her mother; Fibromyalgia in her mother; Heart defect in her mother and mother; Hypothyroidism in her mother; Obesity in her mother.  SOCIAL HISTORY:  reports that she has been smoking.  She has never used smokeless tobacco. She reports that she does not drink alcohol or use illicit drugs.  REVIEW OF SYSTEMS:  Other than that discussed above is noncontributory.  PHYSICAL EXAMINATION: ECOG PERFORMANCE STATUS: 1 - Symptomatic but completely ambulatory  Last menstrual period 08/14/2013.  GENERAL:alert, no distress and comfortable SKIN: Right upper extremity with new areas of skin breakdown with no largely present per EYES: PERLA; Conjunctiva are pink and non-injected, sclera clear OROPHARYNX:no exudate, no erythema on lips, buccal mucosa, or tongue. Multiple dental caries. NECK: supple, thyroid normal size, non-tender, without nodularity. No masses CHEST: Normal AP diameter with no breast masses. LYMPH:  no palpable lymphadenopathy in the cervical, axillary or inguinal LUNGS: clear to auscultation and percussion with normal breathing effort HEART: regular rate & rhythm and no murmurs and no lower extremity edema ABDOMEN:abdomen soft, non-tender and normal bowel sounds MUSCULOSKELETAL:no cyanosis of digits and no clubbing. Range of motion normal.  NEURO: alert & oriented x 3 with fluent speech, no focal motor/sensory deficits   LABORATORY DATA: Office Visit on 08/18/2013  Component Date Value Range Status  . WBC 08/18/2013 11.0* 4.0 - 10.5 K/uL Final  . RBC 08/18/2013 4.29  3.87 - 5.11 MIL/uL Final  . Hemoglobin 08/18/2013 12.8  12.0 - 15.0 g/dL  Final  . HCT 08/18/2013 38.6   36.0 - 46.0 % Final  . MCV 08/18/2013 90.0  78.0 - 100.0 fL Final  . MCH 08/18/2013 29.8  26.0 - 34.0 pg Final  . MCHC 08/18/2013 33.2  30.0 - 36.0 g/dL Final  . RDW 16/08/9603 17.2* 11.5 - 15.5 % Final  . Platelets 08/18/2013 314  150 - 400 K/uL Final  . Neutrophils Relative % 08/18/2013 71  43 - 77 % Final  . Neutro Abs 08/18/2013 7.8* 1.7 - 7.7 K/uL Final  . Lymphocytes Relative 08/18/2013 20  12 - 46 % Final  . Lymphs Abs 08/18/2013 2.2  0.7 - 4.0 K/uL Final  . Monocytes Relative 08/18/2013 4  3 - 12 % Final  . Monocytes Absolute 08/18/2013 0.4  0.1 - 1.0 K/uL Final  . Eosinophils Relative 08/18/2013 5  0 - 5 % Final  . Eosinophils Absolute 08/18/2013 0.6  0.0 - 0.7 K/uL Final  . Basophils Relative 08/18/2013 1  0 - 1 % Final  . Basophils Absolute 08/18/2013 0.1  0.0 - 0.1 K/uL Final  . Strongyloides Ab 08/18/2013 NEGATIVE   Final   Comment: (NOTE)                          REFERENCE RANGE:  NEGATIVE                          Strongyloides stercoralis is a parasitic                          nematode found in tropical and subtropical                          regions.  Because of low larval densities in                          feces, stool examination is a relatively                          insensitive diagnostic test; antibody detection                          offers increased sensitivity. Patients with                          latent infections who are immunosuppressed or                          receiving immunosuppressive therapy are at risk                          of life-threatening hyperinfection. Significant                          crossreactivity may be observed in other                          helminth infections.                          Test performed by [facility]  [address]                                           [place] ,  [address]                                           Phone: [phone]                            Medical Director: Fransico Him. Pesano, M.D.                                           Director of Laboratories                          Test Reported by Clerance Lav,                          [facility],                          [address] [place], [place] [address]                          Jennet Maduro, M.D., Ph.D., Director of Laboratories                          [phone], CLIA [id number]                          Performed at [facility]  . HTLV I/II Ab 08/18/2013 Nonreactive  Nonreactive Final   Comment: (NOTE)                          The HTLV-I/HTLV-II antibody test is FDA cleared/                          approved to screen donors.  Use for screening in                          non-donors has been validated by [facility].  Marland Kitchen HTLV I/II Ab, [place] 08/18/2013 REPORT   Final   Comment: Not indicated                          Performed at [facility]  Office Visit on 08/04/2013  Component Date Value Range Status  . Specimen Description 08/04/2013 STOOL   Final  . Special Requests 08/04/2013 NONE   Final  . Ova and parasites 08/04/2013    Final                   Value:NO OVA OR PARASITES SEEN  Performed at [facility]  . Report Status 08/04/2013 08/09/2013 FINAL   Final  . Specimen Description 08/12/2013 STOOL   Final  . Special Requests 08/12/2013 NONE   Final  . Culture 08/12/2013    Final                   Value:NO SALMONELLA, SHIGELLA, CAMPYLOBACTER, YERSINIA, OR E.COLI 0157:H7 ISOLATED                         Performed at [facility]  . Report Status 08/12/2013 08/16/2013 FINAL   Final  . Specimen Description 08/12/2013 STOOL   Final  . Special Requests 08/12/2013 NONE   Final  . Ova and parasites 08/12/2013    Final                   Value:NO OVA OR PARASITES SEEN                         Performed at [facility]  . Report  Status 08/12/2013 08/18/2013 FINAL   Final  Infusion on 08/02/2013  Component Date Value Range Status  . Sodium 08/02/2013 139  135 - 145 mEq/L Final  . Potassium 08/02/2013 4.1  3.5 - 5.1 mEq/L Final  . Chloride 08/02/2013 106  96 - 112 mEq/L Final  . CO2 08/02/2013 23  19 - 32 mEq/L Final  . Glucose, Bld 08/02/2013 79  70 - 99 mg/dL Final  . BUN 57/84/6962 11  6 - 23 mg/dL Final  . Creatinine, Ser 08/02/2013 0.73  0.50 - 1.10 mg/dL Final  . Calcium 95/28/4132 9.3  8.4 - 10.5 mg/dL Final  . Total Protein 08/02/2013 6.6  6.0 - 8.3 g/dL Final  . Albumin 44/11/270 3.6  3.5 - 5.2 g/dL Final  . AST 53/66/4403 19  0 - 37 U/L Final  . ALT 08/02/2013 19  0 - 35 U/L Final  . Alkaline Phosphatase 08/02/2013 114  39 - 117 U/L Final  . Total Bilirubin 08/02/2013 0.2* 0.3 - 1.2 mg/dL Final  . GFR calc non Af Amer 08/02/2013 >90  >90 mL/min Final  . GFR calc Af Amer 08/02/2013 >90  >90 mL/min Final   Comment: (NOTE)                          The eGFR has been calculated using the CKD EPI equation.                          This calculation has not been validated in all clinical situations.                          eGFR's persistently <90 mL/min signify possible Chronic Kidney                          Disease.  . WBC 08/02/2013 6.7  4.0 - 10.5 K/uL Final  . RBC 08/02/2013 4.15  3.87 - 5.11 MIL/uL Final  . Hemoglobin 08/02/2013 12.1  12.0 - 15.0 g/dL Final  . HCT 47/42/5956 37.3  36.0 - 46.0 % Final  . MCV 08/02/2013 89.9  78.0 - 100.0 fL Final  . MCH 08/02/2013 29.2  26.0 - 34.0 pg Final  . MCHC 08/02/2013 32.4  30.0 - 36.0 g/dL  Final  . RDW 08/02/2013 17.6* 11.5 - 15.5 % Final  . Platelets 08/02/2013 206  150 - 400 K/uL Final  . Neutrophils Relative % 08/02/2013 54  43 - 77 % Final  . Neutro Abs 08/02/2013 3.6  1.7 - 7.7 K/uL Final  . Lymphocytes Relative 08/02/2013 34  12 - 46 % Final  . Lymphs Abs 08/02/2013 2.2  0.7 - 4.0 K/uL Final  . Monocytes Relative 08/02/2013 5  3 - 12 % Final  .  Monocytes Absolute 08/02/2013 0.4  0.1 - 1.0 K/uL Final  . Eosinophils Relative 08/02/2013 6* 0 - 5 % Final  . Eosinophils Absolute 08/02/2013 0.4  0.0 - 0.7 K/uL Final  . Basophils Relative 08/02/2013 1  0 - 1 % Final  . Basophils Absolute 08/02/2013 0.1  0.0 - 0.1 K/uL Final  . Iron 08/02/2013 90  42 - 135 ug/dL Final  . TIBC 96/02/5408 306  250 - 470 ug/dL Final  . Saturation Ratios 08/02/2013 29  20 - 55 % Final  . UIBC 08/02/2013 216  125 - 400 ug/dL Final   Performed at [facility]  . Vitamin B-12 08/02/2013 382  211 - 911 pg/mL Final   Performed at [facility]  . Ferritin 08/02/2013 142  10 - 291 ng/mL Final   Performed at [facility]  . Strongyloides Ab 08/02/2013 NEGATIVE   Final   Comment: (NOTE)                          REFERENCE RANGE:  NEGATIVE                          Strongyloides stercoralis is a parasitic                          nematode found in tropical and subtropical                          regions.  Because of low larval densities in                          feces, stool examination is a relatively                          insensitive diagnostic test; antibody detection                          offers increased sensitivity. Patients with                          latent infections who are immunosuppressed or                          receiving immunosuppressive therapy are at risk                          of life-threatening hyperinfection. Significant                          crossreactivity may be observed in other  helminth infections.                          Test performed by [facility]                                           [address]                                           [place], [place] [address]                                           Phone: [phone]                          Medical Director: Fransico Him. Pesano, M.D.                                           Director of  Laboratories                          Test Reported by Clerance Lav,                          [facility],                          [address], [place], [place] [address]                          Jennet Maduro, M.D., Ph.D., Director of Laboratories                          [phone], CLIA [id number]                          Performed at [facility]    PATHOLOGY:  Urinalysis    Component Value Date/Time   COLORURINE YELLOW 12/17/2006 0411   APPEARANCEUR CLEAR 12/17/2006 0411   LABSPEC 1.015 12/17/2006 0411   PHURINE 7.5 12/17/2006 0411   BILIRUBINUR NEG 12/17/2006 0411   KETONESUR NEG mg/dL 3/32/9518 8416   PROTEINUR NEG mg/dL 04/08/3015 0109   UROBILINOGEN 0.2 12/17/2006 0411   NITRITE NEG 12/17/2006 0411   LEUKOCYTESUR NEG 12/17/2006 0411    RADIOGRAPHIC STUDIES: No results found.  ASSESSMENT:  #1. 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 #2. Anxiety.  PLAN:  #1. Ivermectin 12 mg orally on 09/01/2013 and again 2 weeks from then. #2. Xanax 0.5 mg to use up to every 4 hours as needed for anxiety with 2-3 tablets at bedtime to help with sleep. #3. Metoclopramide 5 mg to 10 mg 3 times a day before meals to help with abdominal bloating. #4. Followup in 4 weeks. #5. Again offered in the presence of her mother second opinion at [facility]  evaluation.   All questions were answered. The patient knows to call the clinic with any problems, questions or concerns. We can certainly see the patient much sooner if necessary.   I spent 25 minutes counseling the patient face to face. The total time spent in the appointment was 30 minutes.    Maurilio Lovely, MD 08/30/2013 4:06 PM

## 2013-08-30 NOTE — Patient Instructions (Addendum)
[facility] Discharge Instructions  RECOMMENDATIONS MADE BY THE CONSULTANT AND ANY TEST RESULTS WILL BE SENT TO YOUR REFERRING PHYSICIAN.  EXAM FINDINGS BY THE PHYSICIAN TODAY AND SIGNS OR SYMPTOMS TO REPORT TO CLINIC OR PRIMARY PHYSICIAN: Exam and findings as discussed by Dr. Zigmund Daniel.  Will retreat with Ivermectin.  MEDICATIONS PRESCRIBED:  Ivermectin - take as directed Xanax- take as directed to help with your anxiety Metoclopramide - take as directed to help with your stomach issues.   INSTRUCTIONS/FOLLOW-UP: Follow-up in 4 weeks  Thank you for choosing [facility] to provide your oncology and hematology care.  To afford each patient quality time with our providers, please arrive at least 15 minutes before your scheduled appointment time.  With your help, our goal is to use those 15 minutes to complete the necessary work-up to ensure our physicians have the information they need to help with your evaluation and healthcare recommendations.    Effective January 1st, 2014, we ask that you re-schedule your appointment with our physicians should you arrive 10 or more minutes late for your appointment.  We strive to give you quality time with our providers, and arriving late affects you and other patients whose appointments are after yours.    Again, thank you for choosing [facility].  Our hope is that these requests will decrease the amount of time that you wait before being seen by our physicians.       _____________________________________________________________  Should you have questions after your visit to [facility], please contact our office at [phone] between the hours of 8:30 a.m. and 5:00 p.m.  Voicemails left after 4:30 p.m. will not be returned until the following business day.  For prescription refill requests, have your pharmacy contact our office with your prescription refill request.

## 2013-08-31 LAB — OVA AND PARASITE EXAMINATION: Ova and parasites: NONE SEEN

## 2013-09-01 ENCOUNTER — Ambulatory Visit (HOSPITAL_COMMUNITY): Payer: Private Health Insurance - Indemnity

## 2013-09-02 NOTE — Progress Notes (Signed)
This encounter was created in error - please disregard.  This encounter was created in error - please disregard.

## 2013-09-05 ENCOUNTER — Telehealth: Payer: Self-pay | Admitting: Family Medicine

## 2013-09-05 NOTE — Telephone Encounter (Signed)
Please print list of medications administered , and prescribed to this patient for service dates of 12/31/2011 and 06/24/2011, I need to send them for insurance purposes Thank you

## 2013-09-05 NOTE — Telephone Encounter (Signed)
Printed and put them in your box

## 2013-09-06 ENCOUNTER — Ambulatory Visit: Payer: Private Health Insurance - Indemnity | Admitting: Family Medicine

## 2013-09-07 ENCOUNTER — Other Ambulatory Visit: Payer: Self-pay

## 2013-09-07 DIAGNOSIS — B789 Strongyloidiasis, unspecified: Secondary | ICD-10-CM | POA: Insufficient documentation

## 2013-09-07 DIAGNOSIS — D649 Anemia, unspecified: Secondary | ICD-10-CM | POA: Insufficient documentation

## 2013-09-27 ENCOUNTER — Ambulatory Visit (HOSPITAL_COMMUNITY): Payer: Private Health Insurance - Indemnity

## 2013-10-06 ENCOUNTER — Encounter (HOSPITAL_COMMUNITY): Payer: Self-pay

## 2013-10-08 ENCOUNTER — Other Ambulatory Visit: Payer: Self-pay | Admitting: Family Medicine

## 2013-10-10 ENCOUNTER — Encounter (HOSPITAL_COMMUNITY): Payer: Private Health Insurance - Indemnity | Attending: Hematology and Oncology

## 2013-10-10 VITALS — BP 130/73 | HR 89 | Temp 97.8°F | Resp 18 | Wt 170.8 lb

## 2013-10-10 DIAGNOSIS — R21 Rash and other nonspecific skin eruption: Secondary | ICD-10-CM | POA: Insufficient documentation

## 2013-10-10 DIAGNOSIS — D721 Eosinophilia, unspecified: Secondary | ICD-10-CM | POA: Insufficient documentation

## 2013-10-10 DIAGNOSIS — B781 Cutaneous strongyloidiasis: Secondary | ICD-10-CM

## 2013-10-10 DIAGNOSIS — D509 Iron deficiency anemia, unspecified: Secondary | ICD-10-CM | POA: Insufficient documentation

## 2013-10-10 DIAGNOSIS — B789 Strongyloidiasis, unspecified: Secondary | ICD-10-CM | POA: Insufficient documentation

## 2013-10-10 DIAGNOSIS — R894 Abnormal immunological findings in specimens from other organs, systems and tissues: Secondary | ICD-10-CM | POA: Insufficient documentation

## 2013-10-10 DIAGNOSIS — F3189 Other bipolar disorder: Secondary | ICD-10-CM

## 2013-10-10 DIAGNOSIS — D649 Anemia, unspecified: Secondary | ICD-10-CM | POA: Insufficient documentation

## 2013-10-10 DIAGNOSIS — Z9884 Bariatric surgery status: Secondary | ICD-10-CM | POA: Insufficient documentation

## 2013-10-10 DIAGNOSIS — E538 Deficiency of other specified B group vitamins: Secondary | ICD-10-CM | POA: Insufficient documentation

## 2013-10-10 MED ORDER — IVERMECTIN 3 MG PO TABS: 12.0000 mg | ORAL_TABLET | Freq: Once | ORAL | Status: DC

## 2013-10-10 MED ORDER — DULOXETINE HCL 30 MG PO CPEP: 30.0000 mg | ORAL_CAPSULE | Freq: Every day | ORAL | Status: DC

## 2013-10-10 NOTE — Progress Notes (Signed)
[facility]  OFFICE PROGRESS NOTE  Alejandra Overman, MD [address] [place] [address]  DIAGNOSIS: Cutaneous strongyloidiasis  Anemia, iron deficiency malabsorption  BIPOLAR II DISORDER  H/O gastric bypass  Chief Complaint  Patient presents with  . Strongyloides  . 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 Intravenous Feraheme on 07/12/2013.  INTERVAL HISTORY: Alejandra Barr 26 y.o. female returns for follow via deficiency anemia secondary to malabsorption due to gastric bypass surgery in the setting of systemic Strongyloides.  She had temporary improvement in symptomatology but her last dose of treatment was on 09/15/2013. Since then she's had alternating episodes of cutaneous pruritic exacerbations in conjunction with abdominal discomfort alternating between constipation and propulsive diarrhea. She denies any fever. She is menstruating regularly the last being 3 weeks ago lasting 3 days. Larvae are present in the stool. She is married but is not having sexual relations. Her husband is a kidney transplant patient. 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  MEDICAL HISTORY: Past Medical History  Diagnosis Date  . Asthma   . Chronic anxiety   . Chronic depression   . Obesity   . Iron deficiency     INTERIM HISTORY: has OBESITY, UNSPECIFIED; iron deficiency anemia malabsorption; BIPOLAR II DISORDER; ANXIETY, CHRONIC; NICOTINE ADDICTION; CLUSTER HEADACHE; ATRIAL FLUTTER; ASTHMA, UNSPECIFIED, UNSPECIFIED STATUS; PAIN IN JOINT, MULTIPLE SITES; PALPITATIONS; Depression; Vitamin D deficiency; Insomnia; Rash and nonspecific skin eruption; GAD (generalized anxiety disorder); Low vitamin B12 level; Abnormal antibody titer--Strongyloides; Cutaneous  strongyloidiasis; and H/O gastric bypass on her problem list.    ALLERGIES:  is allergic to ciprofloxacin; morphine; and penicillins.  MEDICATIONS: has a current medication list which includes the following prescription(s): albuterol, alprazolam, aspirin-salicylamide-caffeine, docusate sodium, flintstones complete, ivermectin, metoclopramide, metoprolol succinate, paroxetine, phentermine, polyethylene glycol powder, triamcinolone ointment, and vitamin d (ergocalciferol).  SURGICAL HISTORY:  Past Surgical History  Procedure Laterality Date  . Cholecystectomy  2003  . Tonsillectomy  1990  . Bilateral eustachian tube placement on 4 different occasions    . Cardiac ablation    . Gastric bypass  2006    FAMILY HISTORY: family history includes Breast cancer in her mother; Fibromyalgia in her mother; Heart defect in her mother and mother; Hypothyroidism in her mother; Obesity in her mother.  SOCIAL HISTORY:  reports that she has been smoking.  She has never used smokeless tobacco. She reports that she does not drink alcohol or use illicit drugs.  REVIEW OF SYSTEMS:  Other than that discussed above is noncontributory.  PHYSICAL EXAMINATION: ECOG PERFORMANCE STATUS: 1 - Symptomatic but completely ambulatory  Blood pressure 130/73, pulse 89, temperature 97.8 F (36.6 C), temperature source Oral, resp. rate 18, weight 170 lb 12.8 oz (77.474 kg).  GENERAL:alert, no distress and comfortable SKIN: skin color, texture, turgor are normal, no rashes or significant lesions. Pleuritic read lesions on the upper extremities and chest EYES: PERLA; Conjunctiva are pink and non-injected, sclera clear OROPHARYNX:no exudate, no erythema on lips, buccal mucosa, or tongue. Poor dentition. NECK: supple, thyroid normal size, non-tender, without nodularity. No masses CHEST: Normal AP diameter with no breast masses. LYMPH:  no palpable lymphadenopathy in the cervical, axillary or inguinal LUNGS: clear to  auscultation and percussion with normal breathing effort  HEART: regular rate & rhythm and no murmurs. ABDOMEN:abdomen soft, non-tender and normal bowel sounds MUSCULOSKELETAL:no cyanosis of digits and no clubbing. Range of motion normal.  NEURO: alert & oriented x 3 with fluent speech, no focal motor/sensory deficits   LABORATORY DATA: Last hemoglobin from 08/18/2013 was 12.8.   No visits with results within 30 Day(s) from this visit. Latest known visit with results is:  Office Visit on 08/30/2013  Component Date Value Range Status  . Specimen Description 08/30/2013 STOOL   Final  . Special Requests 08/30/2013 NONE   Final  . Ova and parasites 08/30/2013    Final                   Value:NO OVA OR PARASITES SEEN                         Performed at [facility]  . Report Status 08/30/2013 08/31/2013 FINAL   Final    PATHOLOGY: No new pathology.  Urinalysis    Component Value Date/Time   COLORURINE YELLOW 12/17/2006 0411   APPEARANCEUR CLEAR 12/17/2006 0411   LABSPEC 1.015 12/17/2006 0411   PHURINE 7.5 12/17/2006 0411   BILIRUBINUR NEG 12/17/2006 0411   KETONESUR NEG mg/dL 01/23/4009 2725   PROTEINUR NEG mg/dL 3/66/4403 4742   UROBILINOGEN 0.2 12/17/2006 0411   NITRITE NEG 12/17/2006 0411   LEUKOCYTESUR NEG 12/17/2006 0411    RADIOGRAPHIC STUDIES: No results found.  ASSESSMENT:  #1. Systemic Strongyloides. #2. Major depression with posttraumatic stress disorder #3. Iron deficiency in the past, status post iron infusion, status post gastric bypass surgery.   PLAN:  #1. Ivermectin 12 mg weekly for 6 weeks. #2. Office visit with CBC and ferritin in 4 weeks.   All questions were answered. The patient knows to call the clinic with any problems, questions or concerns. We can certainly see the patient much sooner if necessary.   I spent 25 minutes counseling the patient face to face. The total time spent in the appointment was 30 minutes.    Maurilio Lovely,  MD 10/10/2013 1:10 PM

## 2013-10-10 NOTE — Patient Instructions (Addendum)
.  [facility] Discharge Instructions  RECOMMENDATIONS MADE BY THE CONSULTANT AND ANY TEST RESULTS WILL BE SENT TO YOUR REFERRING PHYSICIAN.  EXAM FINDINGS BY THE PHYSICIAN TODAY AND SIGNS OR SYMPTOMS TO REPORT TO CLINIC OR PRIMARY PHYSICIAN: Exam and findings as discussed by Dr. Zigmund Daniel.  MEDICATIONS PRESCRIBED: Will treat with ivermectin for 4 weeks straight ivermectin (STROMECTOL) 3 MG  Take 4 tablets (12 mg total) by mouth once. Take weekly for 6 weeks. Cymbalta Take 1 capsule (30 mg total) by mouth daily.    INSTRUCTIONS/FOLLOW-UP: 4 weeks with labs  Thank you for choosing [facility] to provide your oncology and hematology care.  To afford each patient quality time with our providers, please arrive at least 15 minutes before your scheduled appointment time.  With your help, our goal is to use those 15 minutes to complete the necessary work-up to ensure our physicians have the information they need to help with your evaluation and healthcare recommendations.    Effective January 1st, 2014, we ask that you re-schedule your appointment with our physicians should you arrive 10 or more minutes late for your appointment.  We strive to give you quality time with our providers, and arriving late affects you and other patients whose appointments are after yours.    Again, thank you for choosing [facility].  Our hope is that these requests will decrease the amount of time that you wait before being seen by our physicians.       _____________________________________________________________  Should you have questions after your visit to [facility] please contact our office at [phone] between the hours of 8:30 a.m. and 5:00 p.m.  Voicemails left after 4:30 p.m. will not be returned until the following business day.  For prescription refill requests, have your pharmacy contact our office with your prescription refill request.

## 2013-10-16 ENCOUNTER — Ambulatory Visit (HOSPITAL_COMMUNITY)
Admission: RE | Admit: 2013-10-16 | Discharge: 2013-10-16 | Disposition: A | Discharge status: Home / Self Care | Payer: Private Health Insurance - Indemnity | Source: Ambulatory Visit | Attending: Family Medicine | Admitting: Family Medicine

## 2013-10-16 ENCOUNTER — Encounter: Payer: Self-pay | Admitting: Family Medicine

## 2013-10-16 ENCOUNTER — Ambulatory Visit (INDEPENDENT_AMBULATORY_CARE_PROVIDER_SITE_OTHER): Payer: Private Health Insurance - Indemnity | Admitting: Family Medicine

## 2013-10-16 VITALS — BP 106/68 | HR 78 | Resp 18 | Ht 62.0 in | Wt 175.0 lb

## 2013-10-16 DIAGNOSIS — R109 Unspecified abdominal pain: Secondary | ICD-10-CM | POA: Insufficient documentation

## 2013-10-16 DIAGNOSIS — N6452 Nipple discharge: Secondary | ICD-10-CM

## 2013-10-16 DIAGNOSIS — R05 Cough: Secondary | ICD-10-CM

## 2013-10-16 DIAGNOSIS — B789 Strongyloidiasis, unspecified: Secondary | ICD-10-CM

## 2013-10-16 DIAGNOSIS — N6459 Other signs and symptoms in breast: Secondary | ICD-10-CM

## 2013-10-16 DIAGNOSIS — B781 Cutaneous strongyloidiasis: Secondary | ICD-10-CM

## 2013-10-16 DIAGNOSIS — F3289 Other specified depressive episodes: Secondary | ICD-10-CM

## 2013-10-16 DIAGNOSIS — R059 Cough, unspecified: Secondary | ICD-10-CM | POA: Insufficient documentation

## 2013-10-16 DIAGNOSIS — E559 Vitamin D deficiency, unspecified: Secondary | ICD-10-CM

## 2013-10-16 DIAGNOSIS — F32A Depression, unspecified: Secondary | ICD-10-CM

## 2013-10-16 DIAGNOSIS — F329 Major depressive disorder, single episode, unspecified: Secondary | ICD-10-CM

## 2013-10-16 DIAGNOSIS — B999 Unspecified infectious disease: Secondary | ICD-10-CM | POA: Insufficient documentation

## 2013-10-16 DIAGNOSIS — R195 Other fecal abnormalities: Secondary | ICD-10-CM

## 2013-10-16 DIAGNOSIS — F411 Generalized anxiety disorder: Secondary | ICD-10-CM

## 2013-10-16 MED ORDER — METOPROLOL SUCCINATE ER 25 MG PO TB24: ORAL_TABLET | ORAL | Status: DC

## 2013-10-16 MED ORDER — ERGOCALCIFEROL 1.25 MG (50000 UT) PO CAPS: 50000.0000 [IU] | ORAL_CAPSULE | ORAL | Status: DC

## 2013-10-16 NOTE — Progress Notes (Signed)
Subjective:    Patient ID: Alejandra Barr, female    DOB: Nov 22, 1985, 27 y.o.   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 Alejandra Barr states that her symptoms seem to "occur in cycles" sometimes she has worms visibly coming out of her skin, sometimes they appear in her menstrual blood, sometimes she coughs up eggs from the worms that she has been told that she has had for a long time, and that  She has a very heavy burden of, by the physician treating her. In the past 3 month, she has c/o bilateral breast discharge, which appears to be getting heavier,and which is green, was clear. Her mother has breast cancer, and they are concerned about this discharge, the feeling is that it  may be a part of the worm infestation , however , there is obvious and reasonable concern about a nipple diischarge Patient and her Mom are also requesting GI evaluation to see if the worms/larvae can actually be identified from aspirate during endoscopy, they  request GI evaluation since no o/p sample has produced worms. Recently , pt was evaluated at the [facility] ID clinic, and they now want another opinion at [facility] This is her first visit since she was seen by me in August when she presented with a rash, which she had been evaluated about 2 months prior to , with no specific diagnosis. 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 She states she was also told that she had scurvy by a dermatologist who saw her and she is taking Vitamin C (OTC). Has teeth shaking with loss of her front teeth partially which was never a problem in the past I daignosed her with vitamin D deficiency, unfortunately dosing originally sent in incorrectly at once monthly, today I am correcting this to  weekly, Alejandra Barr and her mother are both aware. She will have levels rechecked in 6 to 8 weeks She states she is currently on cymbalta and xanax through the hematologist treating her     Review of Systems See HPI Denies recent fever or chills. Intermittent cough with production of visible eggs CVS: has had palpitations, mainly associated with the anxiety of her chronic illness. No complaint currently. Of note she has had  ablation as a teen GI: intermittent passage of visible worms in stool, has photos in her cell phone. Variation in bowel movements, no c/o visible rectal blood, no vomiting GU: visible worms in menstrual blood, pictures on her cell phone Skin: multiple areas of skin breakdown, where patient reports worms actually physically crawl out of her skin at times Psych: appropriate level of anxiety re her chronic disabling illness, not suicidal or homicidal       Objective:   Physical Exam  Patient alert and oriented and in no cardiopulmonary distress.  HEENT: No facial asymmetry, EOMI, .  Neck supple .  Chest: Clear to auscultation bilaterally.  CVS: S1, S2 no murmurs    ABD: Soft non tender.   Ext: No edema  MS: Adequate ROM spine, shoulders, hips and knees.  Skin: multiple scratch marks from healed areas where pt has been scratching, also multiple open areas    Psych: Good eye contact, normal affect. Memory intact not anxious or depressed appearing.  CNS: CN 2-12  intact, power, tone and sensation normal throughout.       Assessment & Plan:

## 2013-10-16 NOTE — Patient Instructions (Addendum)
F/u in 2 month  You need to get a CXR and abdominal x rays today  You will be referred to [facility] ID per your request  You will be referred to Dr Karilyn Cota per your request   You will be referred to breast specialist re nipple discharge  New correct dose of vitamin D is once weekly  Vit D level  In 4 weeks

## 2013-10-17 ENCOUNTER — Telehealth: Payer: Self-pay | Admitting: Family Medicine

## 2013-10-19 ENCOUNTER — Telehealth: Payer: Self-pay | Admitting: Family Medicine

## 2013-10-19 ENCOUNTER — Inpatient Hospital Stay (HOSPITAL_COMMUNITY)
Admission: AD | Admit: 2013-10-19 | Discharge: 2013-10-19 | Discharge status: Against Medical Advice | Payer: Private Health Insurance - Indemnity | Attending: Obstetrics & Gynecology | Admitting: Obstetrics & Gynecology

## 2013-10-19 DIAGNOSIS — N6452 Nipple discharge: Secondary | ICD-10-CM | POA: Insufficient documentation

## 2013-10-19 NOTE — Telephone Encounter (Signed)
Per a conversation 2 days ago, the patient will need to sign for her records to be sent from the facilities where she has been evaluated and treated for strongyloides ,to  the infectious disease specialist  that she is asking to be seen [facility]  [facility]. These clinics are the heme/onc clinic at [facility] (specialty clinic) , the ID clinic at [facility] , and the I/D clinic in [place]. She will need to physically sign for the records at the respective offices, to ensure that the complete records are actually sent, this is for best patient care in terms of complete records beng sent, please explain. I tried to speak directly with Alejandra Barr but got a voice message I am also entering the referral for her breast evaluation

## 2013-10-19 NOTE — Assessment & Plan Note (Signed)
Reports that she is being prescribed cymbalta by hematology

## 2013-10-19 NOTE — Assessment & Plan Note (Addendum)
Bilateral nipple d/c x 3 month,  Increasing in quantity, mother has breast cancer

## 2013-10-19 NOTE — Assessment & Plan Note (Signed)
Xanax being prescribed by hematologist

## 2013-10-19 NOTE — Assessment & Plan Note (Signed)
Currently being treated by hematology dept. Has recently had eval at [facility] clinic and are requesting another eval at [facility] ID clinic , referral enterered

## 2013-10-19 NOTE — Assessment & Plan Note (Signed)
Intermittent cough productive ofeggs from worm infestation, cxr ordered

## 2013-10-20 ENCOUNTER — Other Ambulatory Visit: Payer: Self-pay | Admitting: Family Medicine

## 2013-10-20 DIAGNOSIS — N6452 Nipple discharge: Secondary | ICD-10-CM

## 2013-10-20 DIAGNOSIS — B789 Strongyloidiasis, unspecified: Secondary | ICD-10-CM

## 2013-10-23 ENCOUNTER — Other Ambulatory Visit: Payer: Private Health Insurance - Indemnity

## 2013-10-23 ENCOUNTER — Ambulatory Visit
Admission: RE | Admit: 2013-10-23 | Discharge: 2013-10-23 | Disposition: A | Discharge status: Home / Self Care | Payer: Private Health Insurance - Indemnity | Source: Ambulatory Visit | Attending: Family Medicine | Admitting: Family Medicine

## 2013-10-23 DIAGNOSIS — N6452 Nipple discharge: Secondary | ICD-10-CM

## 2013-10-23 DIAGNOSIS — B789 Strongyloidiasis, unspecified: Secondary | ICD-10-CM

## 2013-10-30 ENCOUNTER — Encounter (INDEPENDENT_AMBULATORY_CARE_PROVIDER_SITE_OTHER): Payer: Self-pay | Admitting: Internal Medicine

## 2013-10-30 ENCOUNTER — Other Ambulatory Visit (INDEPENDENT_AMBULATORY_CARE_PROVIDER_SITE_OTHER): Payer: Self-pay | Admitting: *Deleted

## 2013-10-30 ENCOUNTER — Ambulatory Visit (INDEPENDENT_AMBULATORY_CARE_PROVIDER_SITE_OTHER): Payer: Private Health Insurance - Indemnity | Admitting: Internal Medicine

## 2013-10-30 ENCOUNTER — Emergency Department (HOSPITAL_COMMUNITY): Payer: Private Health Insurance - Indemnity

## 2013-10-30 ENCOUNTER — Telehealth (INDEPENDENT_AMBULATORY_CARE_PROVIDER_SITE_OTHER): Payer: Self-pay | Admitting: *Deleted

## 2013-10-30 ENCOUNTER — Emergency Department (HOSPITAL_COMMUNITY)
Admission: EM | Admit: 2013-10-30 | Discharge: 2013-10-30 | Discharge status: Against Medical Advice | Payer: Private Health Insurance - Indemnity | Attending: Emergency Medicine | Admitting: Emergency Medicine

## 2013-10-30 ENCOUNTER — Other Ambulatory Visit (INDEPENDENT_AMBULATORY_CARE_PROVIDER_SITE_OTHER): Payer: Self-pay | Admitting: Internal Medicine

## 2013-10-30 ENCOUNTER — Encounter (HOSPITAL_COMMUNITY): Payer: Self-pay | Admitting: Emergency Medicine

## 2013-10-30 VITALS — BP 90/48 | HR 84 | Temp 98.3°F | Ht 62.0 in | Wt 174.5 lb

## 2013-10-30 DIAGNOSIS — B829 Intestinal parasitism, unspecified: Secondary | ICD-10-CM

## 2013-10-30 DIAGNOSIS — F17200 Nicotine dependence, unspecified, uncomplicated: Secondary | ICD-10-CM | POA: Insufficient documentation

## 2013-10-30 DIAGNOSIS — S40019A Contusion of unspecified shoulder, initial encounter: Secondary | ICD-10-CM | POA: Insufficient documentation

## 2013-10-30 DIAGNOSIS — J45909 Unspecified asthma, uncomplicated: Secondary | ICD-10-CM

## 2013-10-30 DIAGNOSIS — Z9884 Bariatric surgery status: Secondary | ICD-10-CM | POA: Insufficient documentation

## 2013-10-30 DIAGNOSIS — R195 Other fecal abnormalities: Secondary | ICD-10-CM

## 2013-10-30 DIAGNOSIS — IMO0002 Reserved for concepts with insufficient information to code with codable children: Secondary | ICD-10-CM | POA: Insufficient documentation

## 2013-10-30 DIAGNOSIS — Z1211 Encounter for screening for malignant neoplasm of colon: Secondary | ICD-10-CM

## 2013-10-30 DIAGNOSIS — S0993XA Unspecified injury of face, initial encounter: Secondary | ICD-10-CM | POA: Insufficient documentation

## 2013-10-30 DIAGNOSIS — Y92410 Unspecified street and highway as the place of occurrence of the external cause: Secondary | ICD-10-CM | POA: Insufficient documentation

## 2013-10-30 DIAGNOSIS — Y9389 Activity, other specified: Secondary | ICD-10-CM | POA: Insufficient documentation

## 2013-10-30 NOTE — Progress Notes (Signed)
Subjective:     Patient ID: Alejandra Barr, female   DOB: Dec 17, 1985, 27 y.o.   MRN: [id number]  HPIReferred to our office by Dr. Lodema Hong for Strongloides. Her mother states the parasites are larger than the Strongloides. Symptoms started in May. In May, she was treated for scabies and was treated Premethain topical.  She actually did not have scabies. Her husband came home 2 months before her and was treated for scabies. He was referred to Infectious Disease but did not follow up. He is a pancrease and kidney transplant. He was a juvenile diabetic.  He is a Charity fundraiser at [employer]. 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  She has been treated with Bactrim, topical steroid cream, and Paxil. Her mother states when she has her period, there are parasites are her pad. The Adipex was stopped one month ago.  She has had weight loss but was intentional.  Her mother has pictures in her camera which states these parasites move. I really cannot make  heads or tails of the pictures. Patient also brought specimen in which I truly cannot identify.  Since October she may 6-8 stools a day and somedays she will skip 2-3 days. Stools have been irregular. Some of her stools are formed and some are loose. No melena. She has seen some bright red blood with her stools.   She is seen at the Oncology and presently receiving Invernectin 12mg   every week x 6 weeks. This week will be the 3rd week. 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  I talked with Corena Herter, RN at the [facility]. She tells me there has been some kind of parasites comiing from the wounds on her arms. She describes them as hair like.  She has broken teeth in her mouth which is new.   Presently taking Invernectin 12mg  ever 1 weeks for  treatment for Strongyloides for 6 weeks.Marland Kitchen He last treatment was on 09/15/2013.  Her first dose was August 04, 2013. She has had 6 doses of the Ivermectin since October. Hx of  gastric bypass in 2007 for obesity.   [place] Biopsy in August:by Dr. Roni Bread  from rt lateral arm Spongiotic dermatitis, lichenified. By [facility] in [place].    Review of Systems Current Outpatient Prescriptions  Medication Sig Dispense Refill  . albuterol (PROVENTIL HFA;VENTOLIN HFA) 108 (90 BASE) MCG/ACT inhaler Inhale 2 puffs into the lungs every 6 (six) hours as needed for wheezing.  18 g  0  . ALPRAZolam (XANAX) 0.5 MG tablet Take 1 tablet (0.5 mg total) by mouth at bedtime as needed for sleep. May take 2-3 tablets at bedtime with one tablet up to every 4 hours as needed for anxiety.  60 tablet  3  . ascorbic acid (VITAMIN C) 250 MG CHEW Chew 500 mg by mouth 3 (three) times daily.      . Aspirin-Salicylamide-Caffeine (BC HEADACHE POWDER PO) Take 1 packet by mouth as needed.      . docusate sodium (COLACE) 100 MG capsule Take 100 mg by mouth daily as needed.       . DULoxetine (CYMBALTA) 30 MG capsule Take 1 capsule (30 mg total) by mouth daily.  30 capsule  3  . ergocalciferol (VITAMIN D2) 50000 UNITS capsule Take 1 capsule (50,000 Units total) by mouth once a week. One capsule once weekly  12 capsule  1  .  flintstones complete (FLINTSTONES) 60 MG chewable tablet Chew 1 tablet by mouth 2 (two) times daily.       Marland Kitchen ivermectin (STROMECTOL) 3 MG TABS tablet Take 4 tablets (12 mg total) by mouth once. Take weekly for 6 weeks.  4 tablet  5  . metoprolol succinate (TOPROL-XL) 25 MG 24 hr tablet TAKE ONE TABLET BY MOUTH EVERY DAY  30 tablet  2   No current facility-administered medications for this visit.   Past Medical History  Diagnosis Date  . Asthma   . Chronic anxiety   . Chronic depression   . Obesity   . Iron deficiency   . Cardiac arrhythmia    Past Surgical History  Procedure Laterality Date  . Cholecystectomy  2003  . Tonsillectomy  1990  . Bilateral eustachian tube placement on 4 different occasions    . Cardiac  ablation    . Gastric bypass  2006   Allergies  Allergen Reactions  . Ciprofloxacin Anaphylaxis  . Morphine Anaphylaxis  . Penicillins Anaphylaxis       Objective:   Physical Exam  Filed Vitals:   10/30/13 1441  BP: 90/48  Pulse: 84  Temp: 98.3 F (36.8 C)  Height: 5\' 2"  (1.575 m)  Weight: 174 lb 8 oz (79.153 kg)   Alert and oriented. Skin warm and dry. Oral mucosa is moist.   . Sclera anicteric, conjunctivae is pink. Thyroid not enlarged. No cervical lymphadenopathy. Lungs clear. Heart regular rate and rhythm.  Abdomen is soft. Bowel sounds are positive. No hepatomegaly. No abdominal masses felt. No tenderness.  No edema to lower extremities. Stool brown and guaiac positive. Scabies type rash all over her body.      Assessment:     Change in stool. Heme positive stools. ? Parasitic infestation.  Mother has pictures of what her daughter has passd thru her stool. I really cannot tell if these are parasites or not. I discussed this case with Dr. Karilyn Cota.     Plan:    Colonoscopy with Dr. Karilyn Cota. Also O and P x 3.  I spent about an hour with this patient face to face.

## 2013-10-30 NOTE — Telephone Encounter (Signed)
Patient needs movi prep 

## 2013-10-30 NOTE — Patient Instructions (Signed)
Further recommendations once we have s O and P back
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
## 2013-10-31 LAB — OVA AND PARASITE EXAMINATION: OP: NONE SEEN

## 2013-10-31 MED ORDER — PEG-KCL-NACL-NASULF-NA ASC-C 100 G PO SOLR: 1.0000 | Freq: Once | ORAL | Status: DC

## 2013-10-31 NOTE — Telephone Encounter (Signed)
Results given to mother. Still need 2 more specimens.

## 2013-11-08 ENCOUNTER — Ambulatory Visit (HOSPITAL_COMMUNITY): Payer: Private Health Insurance - Indemnity

## 2013-11-08 ENCOUNTER — Other Ambulatory Visit (HOSPITAL_COMMUNITY): Payer: Private Health Insurance - Indemnity

## 2013-11-08 DIAGNOSIS — L281 Prurigo nodularis: Secondary | ICD-10-CM | POA: Insufficient documentation

## 2013-11-08 DIAGNOSIS — J45909 Unspecified asthma, uncomplicated: Secondary | ICD-10-CM | POA: Insufficient documentation

## 2013-11-08 DIAGNOSIS — R898 Other abnormal findings in specimens from other organs, systems and tissues: Secondary | ICD-10-CM | POA: Insufficient documentation

## 2013-11-08 DIAGNOSIS — D721 Eosinophilia: Secondary | ICD-10-CM

## 2013-11-08 DIAGNOSIS — E611 Iron deficiency: Secondary | ICD-10-CM | POA: Insufficient documentation

## 2013-11-09 ENCOUNTER — Ambulatory Visit (INDEPENDENT_AMBULATORY_CARE_PROVIDER_SITE_OTHER): Payer: Private Health Insurance - Indemnity | Admitting: General Surgery

## 2013-11-09 NOTE — Progress Notes (Signed)
This encounter was created in error - please disregard.

## 2013-11-14 ENCOUNTER — Encounter (INDEPENDENT_AMBULATORY_CARE_PROVIDER_SITE_OTHER): Payer: Self-pay | Admitting: General Surgery

## 2013-11-22 ENCOUNTER — Other Ambulatory Visit (HOSPITAL_COMMUNITY): Payer: Private Health Insurance - Indemnity

## 2013-11-22 ENCOUNTER — Ambulatory Visit (HOSPITAL_COMMUNITY): Payer: Private Health Insurance - Indemnity

## 2013-11-23 NOTE — Progress Notes (Signed)
This encounter was created in error - please disregard.

## 2013-11-24 ENCOUNTER — Encounter (HOSPITAL_COMMUNITY): Payer: Self-pay

## 2013-11-28 ENCOUNTER — Encounter (HOSPITAL_COMMUNITY): Payer: Self-pay | Admitting: Pharmacy Technician

## 2013-11-29 ENCOUNTER — Encounter (HOSPITAL_COMMUNITY)
Admission: RE | Disposition: A | Discharge status: Home / Self Care | Payer: Self-pay | Source: Ambulatory Visit | Attending: Internal Medicine

## 2013-11-29 ENCOUNTER — Encounter (HOSPITAL_COMMUNITY): Payer: Self-pay | Admitting: *Deleted

## 2013-11-29 ENCOUNTER — Ambulatory Visit (HOSPITAL_COMMUNITY)
Admission: RE | Admit: 2013-11-29 | Discharge: 2013-11-29 | Disposition: A | Discharge status: Home / Self Care | Payer: BC Managed Care – PPO | Source: Ambulatory Visit | Attending: Internal Medicine | Admitting: Internal Medicine

## 2013-11-29 DIAGNOSIS — R110 Nausea: Secondary | ICD-10-CM | POA: Insufficient documentation

## 2013-11-29 DIAGNOSIS — K921 Melena: Secondary | ICD-10-CM

## 2013-11-29 DIAGNOSIS — Q438 Other specified congenital malformations of intestine: Secondary | ICD-10-CM | POA: Insufficient documentation

## 2013-11-29 DIAGNOSIS — K2981 Duodenitis with bleeding: Secondary | ICD-10-CM | POA: Insufficient documentation

## 2013-11-29 DIAGNOSIS — K2990 Gastroduodenitis, unspecified, without bleeding: Secondary | ICD-10-CM

## 2013-11-29 DIAGNOSIS — R1013 Epigastric pain: Secondary | ICD-10-CM | POA: Insufficient documentation

## 2013-11-29 DIAGNOSIS — D509 Iron deficiency anemia, unspecified: Secondary | ICD-10-CM | POA: Insufficient documentation

## 2013-11-29 DIAGNOSIS — K2970 Gastritis, unspecified, without bleeding: Secondary | ICD-10-CM | POA: Insufficient documentation

## 2013-11-29 DIAGNOSIS — B829 Intestinal parasitism, unspecified: Secondary | ICD-10-CM

## 2013-11-29 DIAGNOSIS — K449 Diaphragmatic hernia without obstruction or gangrene: Secondary | ICD-10-CM

## 2013-11-29 DIAGNOSIS — R195 Other fecal abnormalities: Secondary | ICD-10-CM

## 2013-11-29 DIAGNOSIS — K644 Residual hemorrhoidal skin tags: Secondary | ICD-10-CM | POA: Insufficient documentation

## 2013-11-29 HISTORY — PX: COLONOSCOPY: SHX5424

## 2013-11-29 SURGERY — COLONOSCOPY
Anesthesia: Moderate Sedation

## 2013-11-29 MED ORDER — MIDAZOLAM HCL 2 MG/2ML IJ SOLN
INTRAMUSCULAR | Status: DC
Start: 2013-11-29 — End: 2013-11-29
  Filled 2013-11-29: qty 2

## 2013-11-29 MED ORDER — BUTAMBEN-TETRACAINE-BENZOCAINE 2-2-14 % EX AERO
INHALATION_SPRAY | CUTANEOUS | Status: DC | PRN
  Administered 2013-11-29: 2 via TOPICAL

## 2013-11-29 MED ORDER — MIDAZOLAM HCL 5 MG/5ML IJ SOLN
INTRAMUSCULAR | Status: DC | PRN
  Administered 2013-11-29 (×3): 2 mg via INTRAVENOUS
  Administered 2013-11-29 (×2): 3 mg via INTRAVENOUS
  Administered 2013-11-29 (×2): 2 mg via INTRAVENOUS
  Administered 2013-11-29: 3 mg via INTRAVENOUS

## 2013-11-29 MED ORDER — MEPERIDINE HCL 50 MG/ML IJ SOLN
INTRAMUSCULAR | Status: AC
  Filled 2013-11-29: qty 1

## 2013-11-29 MED ORDER — MIDAZOLAM HCL 5 MG/5ML IJ SOLN
INTRAMUSCULAR | Status: AC
  Filled 2013-11-29: qty 10

## 2013-11-29 MED ORDER — SODIUM CHLORIDE 0.9 % IV SOLN
INTRAVENOUS | Status: DC
  Administered 2013-11-29: 1000 mL via INTRAVENOUS

## 2013-11-29 MED ORDER — MIDAZOLAM HCL 5 MG/5ML IJ SOLN
INTRAMUSCULAR | Status: AC
  Filled 2013-11-29: qty 5

## 2013-11-29 MED ORDER — MEPERIDINE HCL 50 MG/ML IJ SOLN
INTRAMUSCULAR | Status: DC | PRN
  Administered 2013-11-29 (×2): 25 mg via INTRAVENOUS

## 2013-11-29 NOTE — Discharge Instructions (Signed)
Resume usual medications. 6 small meals daily. Fiber supplement 4 g by mouth daily. No driving for 24 hours. Physician will contact you with results of biopsy and blood test.   Esophagogastroduodenoscopy Care After Refer to this sheet in the next few weeks. These instructions provide you with information on caring for yourself after your procedure. Your caregiver may also give you more specific instructions. Your treatment has been planned according to current medical practices, but problems sometimes occur. Call your caregiver if you have any problems or questions after your procedure.  HOME CARE INSTRUCTIONS  Do not eat or drink anything until the numbing medicine (local anesthetic) has worn off and your gag reflex has returned. You will know that the local anesthetic has worn off when you can swallow comfortably.  Do not drive for 12 hours after the procedure or as directed by your caregiver.  Only take medicines as directed by your caregiver. SEEK MEDICAL CARE IF:   You cannot stop coughing.  You are not urinating at all or less than usual. SEEK IMMEDIATE MEDICAL CARE IF:  You have difficulty swallowing.  You cannot eat or drink.  You have worsening throat or chest pain.  You have dizziness, lightheadedness, or you faint.  You have nausea or vomiting.  You have chills.  You have a fever.  You have severe abdominal pain.  You have black, tarry, or bloody stools. Document Released: 10/05/2012 Document Reviewed: 10/05/2012 [facility] Patient Information 2014 [place], [place]. Colonoscopy, Care After Refer to this sheet in the next few weeks. These instructions provide you with information on caring for yourself after your procedure. Your health care provider may also give you more specific instructions. Your treatment has been planned according to current medical practices, but problems sometimes occur. Call your health care provider if you have any problems or questions  after your procedure. WHAT TO EXPECT AFTER THE PROCEDURE  After your procedure, it is typical to have the following:  A small amount of blood in your stool.  Moderate amounts of gas and mild abdominal cramping or bloating. HOME CARE INSTRUCTIONS  Do not drive, operate machinery, or sign important documents for 24 hours.  You may shower and resume your regular physical activities, but move at a slower pace for the first 24 hours.  Take frequent rest periods for the first 24 hours.  Walk around or put a warm pack on your abdomen to help reduce abdominal cramping and bloating.  Drink enough fluids to keep your urine clear or pale yellow.  You may resume your normal diet as instructed by your health care provider. Avoid heavy or fried foods that are hard to digest.  Avoid drinking alcohol for 24 hours or as instructed by your health care provider.  Only take over-the-counter or prescription medicines as directed by your health care provider.  If a tissue sample (biopsy) was taken during your procedure:  Do not take aspirin or blood thinners for 7 days, or as instructed by your health care provider.  Do not drink alcohol for 7 days, or as instructed by your health care provider.  Eat soft foods for the first 24 hours. SEEK MEDICAL CARE IF: You have persistent spotting of blood in your stool 2 3 days after the procedure. SEEK IMMEDIATE MEDICAL CARE IF:  You have more than a small spotting of blood in your stool.  You pass large blood clots in your stool.  Your abdomen is swollen (distended).  You have nausea or vomiting.  You have a fever.  You have increasing abdominal pain that is not relieved with medicine. Document Released: 06/02/2004 Document Revised: 08/09/2013 Document Reviewed: 06/26/2013 [facility] Patient Information 2014 [place].

## 2013-11-29 NOTE — Op Note (Addendum)
EGD PROCEDURE REPORT  PATIENT:  Alejandra Barr  MR#:  [id number] Birthdate:  July 14, 1986, 28 y.o., female Endoscopist:  Dr. Rogene Houston, MD Referred By:  Dr. [name], MD  Procedure Date: 11/29/2013  Procedure:   EGD & Colonoscopy  Indications:  Patient is a 28 year old Caucasian female who presents with recurrent epigastric pain and nausea. 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 She was also found to have heme positive stool and has intermittent hematochezia. She has been treated for Strongyloides because blood test i.e. antibody was positive. She also has unexplained skin rash. Dermatologist at [facility] thought it was secondary to scurvy; rash has not gone away with MVI therapy.            Informed Consent:  The risks, benefits, alternatives & imponderables which include, but are not limited to, bleeding, infection, perforation, drug reaction and potential missed lesion have been reviewed.  The potential for biopsy, lesion removal, esophageal dilation, etc. have also been discussed.  Questions have been answered.  All parties agreeable.  Please see history & physical in medical record for more information.  Medications:  Demerol 50 mg IV Versed 19 mg IV Cetacaine spray topically for oropharyngeal anesthesia  EGD  Description of procedure:  The endoscope was introduced through the mouth and advanced to the second portion of the duodenum without difficulty or limitations. The mucosal surfaces were surveyed very carefully during advancement of the scope and upon withdrawal.  Findings:  Esophagus:  Mucosa of the esophagus was normal. GE junction was unremarkable. GEJ:  34 cm Hiatus:  36 cm Stomach:  Small gastric pouch with focal erythema and few specks of blood coating the mucosa. No active bleeding noted. Gastrojejunostomy was wide-open without ulceration. Jejunum:  Jejunal mucosa was examined for at least  20 cm and appeared normal. Biopsy was taken for routine histology  Therapeutic/Diagnostic Maneuvers Performed:  See above  COLONOSCOPY Description of procedure:  After a digital rectal exam was performed, that colonoscope was advanced from the anus through the rectum and colon to the area of the cecum, ileocecal valve and appendiceal orifice. The cecum was deeply intubated. These structures were well-seen and photographed for the record. From the level of the cecum and ileocecal valve, the scope was slowly and cautiously withdrawn. The mucosal surfaces were carefully surveyed utilizing scope tip to flexion to facilitate fold flattening as needed. The scope was pulled down into the rectum where a thorough exam including retroflexion was performed.  Findings:   Prep excellent. Redundant colon but mucosa was normal throughout.   rectal mucosa similarly was normal. Small hemorrhoids noted below the dentate line.  Therapeutic/Diagnostic Maneuvers Performed:  None   Complications:  None   Cecal Withdrawal Time:  7  minutes  Impression:   EGD findings; Small sliding hiatal hernia but no evidence of erosive esophagitis. Small gastric pouch with gastritis and wide open gastrojejunostomy without ulceration. Normal mucosa of the jejunum for up to 20 cm. Random biopsies taken.  Colonoscopy findings; Normal colonoscopy except external hemorrhoids.   Recommendations:  H. pylori serology. Fiber supplement 4 g by mouth daily. I will be contacting patient with results of biopsy and blood test and further recommendations.   Ladiamond Gallina U  11/29/2013 5:56 PM  CC: Dr. [name], MD & Dr. Rayne Du ref. provider found

## 2013-11-29 NOTE — H&P (Signed)
Alejandra Barr is an 28 y.o. female.   Chief Complaint: Patient is  here for EGD and colonoscopy. HPI: Patient is 28 year old Caucasian female who presents with intermittent epigastric pain both before and after meals associated with nausea. She also has history of multiple deficiencies including iron deficiency. She was felt to have strong minorities and has been given ivermectin weekly for 6 weeks. She also gives history of intermittent hematochezia and was found to have heme-positive stools. She also has papular skin rash over extremities which has been biopsied twice but no definite diagnosis made. 1 point was felt rash may be secondary to strong minorities but she said: Peak time 4 and they have always been negative. Symptoms started with skin rash in May last year and 3 months ago she began to have GI symptoms. She had Roux-en-Y procedure for morbid obesity in 2007 and has lost over 160 pounds. Iron deficiency as well as other deficiencies felt to be secondary to this procedure.  Past Medical History  Diagnosis Date  . Asthma   . Chronic anxiety   . Chronic depression   . Obesity   . Iron deficiency   . Cardiac arrhythmia     Past Surgical History  Procedure Laterality Date  . Cholecystectomy  2003  . Tonsillectomy  1990  . Bilateral eustachian tube placement on 4 different occasions    . Cardiac ablation    . Gastric bypass  2006    Family History  Problem Relation Age of Onset  . Fibromyalgia Mother     Chronic pelvic pain   . Breast cancer Mother   . Hypothyroidism Mother   . Obesity Mother   . Heart defect Mother     supraventricular, tachycardia   . Heart defect Mother     tachypalpation    Social History:  reports that she has been smoking.  She started smoking about 8 months ago. She has never used smokeless tobacco. She reports that she does not drink alcohol or use illicit drugs.  Allergies:  Allergies  Allergen Reactions  . Ciprofloxacin Anaphylaxis  .  Morphine Anaphylaxis  . Morphine And Related Anaphylaxis  . Penicillins Anaphylaxis    Medications Prior to Admission  Medication Sig Dispense Refill  . ALPRAZolam (XANAX) 0.5 MG tablet Take 1 tablet (0.5 mg total) by mouth at bedtime as needed for sleep. May take 2-3 tablets at bedtime with one tablet up to every 4 hours as needed for anxiety.  60 tablet  3  . ascorbic acid (VITAMIN C) 250 MG CHEW Chew 500 mg by mouth 3 (three) times daily.      . Aspirin-Salicylamide-Caffeine (BC HEADACHE POWDER PO) Take 1 packet by mouth as needed.      . docusate sodium (COLACE) 100 MG capsule Take 100 mg by mouth daily as needed.       . DULoxetine (CYMBALTA) 30 MG capsule Take 1 capsule (30 mg total) by mouth daily.  30 capsule  3  . ergocalciferol (VITAMIN D2) 50000 UNITS capsule Take 50,000 Units by mouth once a week. One capsule on Wednesday      . flintstones complete (FLINTSTONES) 60 MG chewable tablet Chew 1 tablet by mouth 2 (two) times daily.       . metoprolol succinate (TOPROL-XL) 25 MG 24 hr tablet TAKE ONE TABLET BY MOUTH EVERY DAY  30 tablet  2  . peg 3350 powder (MOVIPREP) 100 G SOLR Take 1 kit (200 g total) by mouth once.  1  kit  0    No results found for this or any previous visit (from the past 48 hour(s)). No results found.  ROS  Blood pressure 127/71, pulse 81, temperature 98.3 F (36.8 C), temperature source Oral, resp. rate 20, height $RemoveBe'5\' 2"'[id number]$  (1.575 m), weight 175 lb (79.379 kg), last menstrual period 11/15/2013, SpO2 100.00%. Physical Exam  Constitutional: She appears well-developed and well-nourished.  HENT:  Mouth/Throat: Oropharynx is clear and moist.  Eyes: Conjunctivae are normal. No scleral icterus.  Neck: No thyromegaly present.  Cardiovascular: Normal rate, regular rhythm and normal heart sounds.   No murmur heard. Respiratory: Effort normal and breath sounds normal.  GI: Soft. Tenderness: mild midepigastric tenderness.  Musculoskeletal: She exhibits no edema.   Lymphadenopathy:    She has no cervical adenopathy.  Neurological: She is alert.  Skin: Skin is warm and dry.     Assessment/Plan Epigastric pain and nausea. Hematochezia and heme-positive stools. History of iron deficiency anemia. EGD and colonoscopy.  REHMAN,NAJEEB U 11/29/2013, 4:23 PM

## 2013-11-30 LAB — H. PYLORI ANTIBODY, IGG: H Pylori IgG: <0.4 {ISR}

## 2013-12-04 ENCOUNTER — Encounter (HOSPITAL_COMMUNITY): Payer: Self-pay | Admitting: Internal Medicine

## 2013-12-14 ENCOUNTER — Other Ambulatory Visit: Payer: Self-pay | Admitting: Family Medicine

## 2013-12-14 DIAGNOSIS — Z01818 Encounter for other preprocedural examination: Secondary | ICD-10-CM

## 2013-12-19 ENCOUNTER — Ambulatory Visit: Payer: Private Health Insurance - Indemnity | Admitting: Family Medicine

## 2013-12-20 ENCOUNTER — Telehealth (INDEPENDENT_AMBULATORY_CARE_PROVIDER_SITE_OTHER): Payer: Self-pay | Admitting: *Deleted

## 2013-12-20 DIAGNOSIS — R894 Abnormal immunological findings in specimens from other organs, systems and tissues: Secondary | ICD-10-CM

## 2013-12-20 DIAGNOSIS — R195 Other fecal abnormalities: Secondary | ICD-10-CM

## 2013-12-20 DIAGNOSIS — B789 Strongyloidiasis, unspecified: Secondary | ICD-10-CM

## 2013-12-20 NOTE — Telephone Encounter (Signed)
Mother Gwenlyn Fudge called about her daughters results.  Please call her at [phone]  Thank you

## 2013-12-21 NOTE — Telephone Encounter (Signed)
Results the patient's mother yesterday. Patient remains with abdominal pain or other symptoms. Will proceed with 24 hour urine collection for heavy metals.

## 2013-12-22 NOTE — Telephone Encounter (Signed)
Lab order complete and faxed to [facility].  Patient's Mom made aware. Message was left on voicemail.

## 2013-12-25 ENCOUNTER — Ambulatory Visit: Payer: Private Health Insurance - Indemnity | Admitting: Family Medicine

## 2013-12-29 ENCOUNTER — Ambulatory Visit (INDEPENDENT_AMBULATORY_CARE_PROVIDER_SITE_OTHER): Payer: BC Managed Care – PPO | Admitting: Cardiology

## 2013-12-29 ENCOUNTER — Encounter: Payer: Self-pay | Admitting: Cardiology

## 2013-12-29 VITALS — BP 105/58 | HR 94 | Ht 62.0 in | Wt 179.0 lb

## 2013-12-29 DIAGNOSIS — Z01810 Encounter for preprocedural cardiovascular examination: Secondary | ICD-10-CM

## 2013-12-29 DIAGNOSIS — I4892 Unspecified atrial flutter: Secondary | ICD-10-CM

## 2013-12-29 NOTE — Addendum Note (Signed)
Addended by: Shara Blazing A on: 12/29/2013 03:08 PM   Modules accepted: Orders

## 2013-12-29 NOTE — Patient Instructions (Addendum)
Your physician recommends that you schedule a follow-up appointment only as needed     Dr Romie Minus and Dr.Riggs at [facility] [phone], office closed today,will call for fax on Monday    Thank you for choosing [facility] !

## 2013-12-29 NOTE — Assessment & Plan Note (Signed)
Reported history of atrial flutter status post radiofrequency ablation by Dr. Lovena Le back in 2004. She does have occasional palpitations but nothing prolonged, no obvious recurrences on low-dose beta blocker.

## 2013-12-29 NOTE — Assessment & Plan Note (Addendum)
Patient being considered for elective dental extractions under sedation, generally low risk from a cardiac perspective. She should be able to proceed with this procedure without additional cardiovascular testing at relatively low cardiovascular risk. ECG is normal. She has a previous history of atrial flutter ablation and has done well on low-dose beta blocker. No specific cardiovascular indication for preprocedure antibiotics.

## 2013-12-29 NOTE — Progress Notes (Addendum)
Clinical Summary Alejandra Barr is a 28 y.o.female referred for cardiology consultation by Dr. Moshe Cipro. She is being considered for dental extractions through Dr. Romie Minus and Dr. Hardie Shackleton, [place]. Cardiology evaluation is requested prior to proceeding in light of a previous history of atrial flutter and radiofrequency ablation. She underwent RFA with successful management of atrial flutter by Dr. Lovena Le back in 2004. She has done well over the years, continues on low-dose beta blocker, currently Toprol-XL 25 mg daily. She does report occasional sense of palpitations, but has had no prolonged rapid palpitations, no dizziness or syncope.  ECG today shows normal sinus rhythm.  Available records also reviewed, she has had recent GI evaluation with Dr. Laural Golden. EGD and colonoscopy done recently.  No difficulties with arrhythmia in this setting.reported.   Allergies  Allergen Reactions  . Ciprofloxacin Anaphylaxis  . Morphine Anaphylaxis  . Morphine And Related Anaphylaxis  . Penicillins Anaphylaxis    Current Outpatient Prescriptions  Medication Sig Dispense Refill  . ALPRAZolam (XANAX) 0.5 MG tablet Take 1 tablet (0.5 mg total) by mouth at bedtime as needed for sleep. May take 2-3 tablets at bedtime with one tablet up to every 4 hours as needed for anxiety.  60 tablet  3  . ascorbic acid (VITAMIN C) 250 MG CHEW Chew 500 mg by mouth 3 (three) times daily.      . Aspirin-Salicylamide-Caffeine (BC HEADACHE POWDER PO) Take 1 packet by mouth as needed.      . DULoxetine (CYMBALTA) 30 MG capsule Take 1 capsule (30 mg total) by mouth daily.  30 capsule  3  . ergocalciferol (VITAMIN D2) 50000 UNITS capsule Take 50,000 Units by mouth once a week. One capsule on Wednesday      . flintstones complete (FLINTSTONES) 60 MG chewable tablet Chew 1 tablet by mouth 2 (two) times daily.       . metoprolol succinate (TOPROL-XL) 25 MG 24 hr tablet TAKE ONE TABLET BY MOUTH EVERY DAY  30 tablet  2    No current facility-administered medications for this visit.    Past Medical History  Diagnosis Date  . Asthma   . Chronic anxiety   . Chronic depression   . Obesity   . Iron deficiency   . 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 Atrial flutter     Status post RFA by Dr. Lovena Le in 2004    Past Surgical History  Procedure Laterality Date  . Cholecystectomy  2003  . Tonsillectomy  1990  . Bilateral eustachian tube placement on 4 different occasions    . Gastric bypass  2006  . Colonoscopy N/A 11/29/2013    Procedure: COLONOSCOPY;  Surgeon: Rogene Houston, MD;  Location: [facility];  Service: Endoscopy;  Laterality: N/A;  120-rescheduled to 300 Ann notified pt    Family History  Problem Relation Age of Onset  . Fibromyalgia Mother     Chronic pelvic pain   . Breast cancer Mother   . Hypothyroidism Mother   . Obesity Mother   . Heart defect Mother     Supraventricular, tachycardia /tachypalpation     Social History Alejandra Barr reports that she has been smoking Cigarettes.  She started smoking about 9 months ago. She has been smoking about 1.00 pack per day. She has never used smokeless tobacco. Alejandra Barr reports that she does not drink alcohol.  Review of Systems  reports having problems with progressive caries and brittle teeth do to vitamin deficiency. She  had a gastric bypass operation years ago. No exertional chest pain or unusual breathlessness. No orthopnea or PND. No recent fevers or chills. Skin rash/infection parenterals. Recent GI workup noted. Otherwise negative   Physical Examination Filed Vitals:   12/29/13 1345  BP: 105/58  Pulse: 94   Filed Weights   12/29/13 1345  Weight: 179 lb (81.194 kg)   Overweight woman, appears comfortable at rest.  HEENT: Conjunctiva and lids normal, oropharynx clear with poor dentition, some broken teeth. Neck: Supple, no elevated JVP or carotid bruits, no thyromegaly. Lungs: Clear to auscultation, nonlabored breathing at  rest. Cardiac: Regular rate and rhythm, no S3 or significant systolic murmur, no pericardial rub. Abdomen: Soft, nontender, bowel sounds present. Extremities: No pitting edema, distal pulses 2+. Skin: Scattered erythematous eruptions, some excoriations. Musculoskeletal: No kyphosis. Neuropsychiatric: Alert and oriented x3, affect grossly appropriate.   Problem List and Plan   Preoperative cardiovascular examination Patient being considered for elective dental extractions under sedation, generally low risk from a cardiac perspective. She should be able to proceed with this procedure without additional cardiovascular testing at relatively low cardiovascular risk. ECG is normal. She has a previous history of atrial flutter ablation and has done well on low-dose beta blocker. No specific cardiovascular indication for preprocedure antibiotics.  Atrial flutter Reported history of atrial flutter status post radiofrequency ablation by Dr. Lovena Le back in 2004. She does have occasional palpitations but nothing prolonged, no obvious recurrences on low-dose beta blocker.    Satira Sark, M.D., F.A.C.C.

## 2014-01-01 ENCOUNTER — Telehealth: Payer: Self-pay | Admitting: Cardiology

## 2014-01-01 ENCOUNTER — Telehealth: Payer: Self-pay

## 2014-01-01 NOTE — Telephone Encounter (Signed)
She is cleared from a cardiac perspective to proceed with elective dental extractions under sedation. I made an addendum to my original note. Please send note with this documentation.

## 2014-01-01 NOTE — Telephone Encounter (Signed)
Pt needs letter stating that pt is cleared for surgery. Please let me know if I can write a letter for you to sign.

## 2014-01-01 NOTE — Telephone Encounter (Signed)
Office needs letter stating that patient is cleared for surgery. Office note does state clearly that she is cleared / tgs

## 2014-01-01 NOTE — Telephone Encounter (Signed)
Cardiologist consult note faxed to Dr.Rehm, [facility]  Fax [phone]

## 2014-01-01 NOTE — Telephone Encounter (Signed)
Last office visit sent to Dr Hardie Shackleton and Dr Precious Gilding at their office.

## 2014-01-08 ENCOUNTER — Ambulatory Visit: Payer: Self-pay | Admitting: Family Medicine

## 2014-06-05 ENCOUNTER — Encounter: Payer: Self-pay | Admitting: Family Medicine

## 2014-06-05 ENCOUNTER — Ambulatory Visit (INDEPENDENT_AMBULATORY_CARE_PROVIDER_SITE_OTHER): Payer: BC Managed Care – PPO | Admitting: Family Medicine

## 2014-06-05 VITALS — BP 100/72 | HR 86 | Resp 16 | Ht 62.0 in | Wt 181.8 lb

## 2014-06-05 DIAGNOSIS — G43109 Migraine with aura, not intractable, without status migrainosus: Secondary | ICD-10-CM | POA: Insufficient documentation

## 2014-06-05 DIAGNOSIS — F17200 Nicotine dependence, unspecified, uncomplicated: Secondary | ICD-10-CM

## 2014-06-05 DIAGNOSIS — D509 Iron deficiency anemia, unspecified: Secondary | ICD-10-CM

## 2014-06-05 DIAGNOSIS — F329 Major depressive disorder, single episode, unspecified: Secondary | ICD-10-CM

## 2014-06-05 DIAGNOSIS — D539 Nutritional anemia, unspecified: Secondary | ICD-10-CM

## 2014-06-05 DIAGNOSIS — F32A Depression, unspecified: Secondary | ICD-10-CM

## 2014-06-05 DIAGNOSIS — F3289 Other specified depressive episodes: Secondary | ICD-10-CM

## 2014-06-05 DIAGNOSIS — R5381 Other malaise: Secondary | ICD-10-CM

## 2014-06-05 DIAGNOSIS — J45909 Unspecified asthma, uncomplicated: Secondary | ICD-10-CM

## 2014-06-05 DIAGNOSIS — E559 Vitamin D deficiency, unspecified: Secondary | ICD-10-CM

## 2014-06-05 DIAGNOSIS — E538 Deficiency of other specified B group vitamins: Secondary | ICD-10-CM

## 2014-06-05 DIAGNOSIS — R5383 Other fatigue: Secondary | ICD-10-CM

## 2014-06-05 DIAGNOSIS — E669 Obesity, unspecified: Secondary | ICD-10-CM

## 2014-06-05 DIAGNOSIS — R51 Headache: Secondary | ICD-10-CM

## 2014-06-05 LAB — CBC WITH DIFFERENTIAL/PLATELET
Basophils Absolute: 0.1 10*3/uL (ref 0.0–0.1)
Basophils Relative: 1 % (ref 0–1)
Eosinophils Absolute: 0.2 10*3/uL (ref 0.0–0.7)
Eosinophils Relative: 3 % (ref 0–5)
HCT: 39.2 % (ref 36.0–46.0)
Hemoglobin: 13.8 g/dL (ref 12.0–15.0)
Lymphocytes Relative: 24 % (ref 12–46)
Lymphs Abs: 1.9 10*3/uL (ref 0.7–4.0)
MCH: 29.5 pg (ref 26.0–34.0)
MCHC: 35.2 g/dL (ref 30.0–36.0)
MCV: 83.8 fL (ref 78.0–100.0)
Monocytes Absolute: 0.3 10*3/uL (ref 0.1–1.0)
Monocytes Relative: 4 % (ref 3–12)
Neutro Abs: 5.4 10*3/uL (ref 1.7–7.7)
Neutrophils Relative %: 68 % (ref 43–77)
Platelets: 341 10*3/uL (ref 150–400)
RBC: 4.68 MIL/uL (ref 3.87–5.11)
RDW: 13.9 % (ref 11.5–15.5)
WBC: 7.9 10*3/uL (ref 4.0–10.5)

## 2014-06-05 LAB — COMPREHENSIVE METABOLIC PANEL
ALT: 9 U/L (ref 0–35)
AST: 18 U/L (ref 0–37)
Albumin: 4.2 g/dL (ref 3.5–5.2)
Alkaline Phosphatase: 98 U/L (ref 39–117)
BUN: 15 mg/dL (ref 6–23)
CO2: 27 mEq/L (ref 19–32)
Calcium: 9.3 mg/dL (ref 8.4–10.5)
Chloride: 103 mEq/L (ref 96–112)
Creat: 0.83 mg/dL (ref 0.50–1.10)
Glucose, Bld: 89 mg/dL (ref 70–99)
Potassium: 4.4 mEq/L (ref 3.5–5.3)
Sodium: 138 mEq/L (ref 135–145)
Total Bilirubin: 0.3 mg/dL (ref 0.2–1.2)
Total Protein: 6.6 g/dL (ref 6.0–8.3)

## 2014-06-05 LAB — FERRITIN: Ferritin: 10 ng/mL (ref 10–291)

## 2014-06-05 LAB — TSH: TSH: 2.807 u[IU]/mL (ref 0.350–4.500)

## 2014-06-05 LAB — IRON: Iron: 99 ug/dL (ref 42–145)

## 2014-06-05 LAB — VITAMIN B12: Vitamin B-12: 206 pg/mL — ABNORMAL LOW (ref 211–911)

## 2014-06-05 MED ORDER — DULOXETINE HCL 30 MG PO CPEP: 30.0000 mg | ORAL_CAPSULE | Freq: Every day | ORAL | Status: DC

## 2014-06-05 MED ORDER — TOPIRAMATE 50 MG PO TABS: 50.0000 mg | ORAL_TABLET | Freq: Two times a day (BID) | ORAL | Status: DC

## 2014-06-05 MED ORDER — BUTALBITAL-APAP-CAFFEINE 50-325-40 MG PO TABS: ORAL_TABLET | ORAL | Status: DC

## 2014-06-05 MED ORDER — METOPROLOL SUCCINATE ER 25 MG PO TB24: ORAL_TABLET | ORAL | Status: DC

## 2014-06-05 MED ORDER — RIZATRIPTAN BENZOATE 10 MG PO TBDP: ORAL_TABLET | ORAL | Status: DC

## 2014-06-05 MED ORDER — ALBUTEROL SULFATE HFA 108 (90 BASE) MCG/ACT IN AERS: 2.0000 | INHALATION_SPRAY | Freq: Four times a day (QID) | RESPIRATORY_TRACT | Status: DC | PRN

## 2014-06-05 MED ORDER — VITAMIN D (ERGOCALCIFEROL) 1.25 MG (50000 UNIT) PO CAPS
50000.0000 [IU] | ORAL_CAPSULE | ORAL | Status: DC
Start: 2014-06-05 — End: 2015-09-03

## 2014-06-05 NOTE — Progress Notes (Signed)
   Subjective:    Patient ID: Alejandra Barr, female    DOB: [birth year], 28 y.o.   MRN: [id number]  HPI Pt in for follow up.Has upcoming dental work which will involve implants, wants her blood to be checked prior to this to ensure she not deficient or anemic.Had been getting B12 injections and IV iron, but has had neither for some time. Skin condition much improved, she stopped taking treatments for worm infestation, after the reassurance by her Gi Doc, that , based on doses of medicaton she had already received she was certainly cured. Had recent cardiac eval as clearance for her upcoming oral surgery C/o increase and uncontrolled headaches. 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 Interested in starting  Over from basics re migraine management . States she still owes on trigger point injections which were started, and that her ins did not cover Smoking has increased, wants to quit but unable to commit, she has remained sober and goes to [employer] but not as often as in the past, sires work stress eating into this time, i encouraged her to make the time, she seems to agree     Review of Systems See HPI Denies recent fever or chills. Denies sinus pressure, nasal congestion, ear pain or sore throat. Denies chest congestion, productive cough or wheezing. Denies chest pains, palpitations and leg swelling Denies abdominal pain, nausea, vomiting,diarrhea or constipation.   Denies dysuria, frequency, hesitancy or incontinence. Denies joint pain, swelling and limitation in mobility. Denies  seizures, numbness, or tingling. Denies uncontrolled depression, anxiety or insomnia.        Objective:   Physical Exam BP 100/72  Pulse 86  Resp 16  Ht 5\' 2"  (1.575 m)  Wt 181 lb 12.8 oz (82.464 kg)  BMI 33.24 kg/m2  SpO2 100% Patient alert and oriented and in no cardiopulmonary distress.  HEENT: No  facial asymmetry, EOMI,   oropharynx pink and moist.  Neck supple no JVD, no mass.  Chest: Clear to auscultation bilaterally.  CVS: S1, S2 no murmurs, no S3.Regular rate.  ABD: Soft non tender.   Ext: No edema  MS: Adequate ROM spine, shoulders, hips and knees.  Skin: Intact, no ulcerations or rash noted.  Psych: Good eye contact, flat l affect. Memory intact not anxious mildly depressed appearing.  CNS: CN 2-12 intact, power,  normal throughout.no focal deficits noted.        Assessment & Plan:  Headache(784.0) Increased i frequencty and severity, will resume medication including prophylactic med and hope for improvement and control No neurologic deficits noted on exam  Depression Continue cymbalta, pt stable on this dose of med  NICOTINE ADDICTION Worsened, however , no commitment to quitting currently  OBESITY, UNSPECIFIED Deteriorated. Patient re-educated about  the importance of commitment to a  minimum of 150 minutes of exercise per week. The importance of healthy food choices with portion control discussed. Encouraged to start a food diary, count calories and to consider  joining a support group. Sample diet sheets offered. Goals set by the patient for the next several months.     ASTHMA, UNSPECIFIED, UNSPECIFIED STATUS Controlled with albuterol as needed, which is less frequent than once per month

## 2014-06-05 NOTE — Patient Instructions (Signed)
F/U in 4 month, call if you need me before   Labs today  Medication sent in for headaches,

## 2014-06-06 ENCOUNTER — Encounter: Payer: Self-pay | Admitting: Family Medicine

## 2014-06-09 NOTE — Assessment & Plan Note (Signed)
Continue cymbalta, pt stable on this dose of med

## 2014-06-09 NOTE — Assessment & Plan Note (Signed)
Deteriorated. Patient re-educated about  the importance of commitment to a  minimum of 150 minutes of exercise per week. The importance of healthy food choices with portion control discussed. Encouraged to start a food diary, count calories and to consider  joining a support group. Sample diet sheets offered. Goals set by the patient for the next several months.    

## 2014-06-09 NOTE — Assessment & Plan Note (Signed)
Worsened, however , no commitment to quitting currently

## 2014-06-09 NOTE — Assessment & Plan Note (Signed)
Controlled with albuterol as needed, which is less frequent than once per month

## 2014-06-09 NOTE — Assessment & Plan Note (Addendum)
Increased i frequencty and severity, will resume medication including prophylactic med and hope for improvement and control No neurologic deficits noted on exam

## 2014-06-19 ENCOUNTER — Other Ambulatory Visit: Payer: Self-pay | Admitting: Family Medicine

## 2014-06-19 ENCOUNTER — Ambulatory Visit (HOSPITAL_COMMUNITY)
Admission: RE | Admit: 2014-06-19 | Discharge: 2014-06-19 | Disposition: A | Discharge status: Home / Self Care | Payer: BC Managed Care – PPO | Source: Ambulatory Visit | Attending: Family Medicine | Admitting: Family Medicine

## 2014-06-19 ENCOUNTER — Encounter: Payer: Self-pay | Admitting: Family Medicine

## 2014-06-19 DIAGNOSIS — R51 Headache: Secondary | ICD-10-CM | POA: Diagnosis not present

## 2014-06-19 DIAGNOSIS — R222 Localized swelling, mass and lump, trunk: Secondary | ICD-10-CM

## 2014-06-19 DIAGNOSIS — M25419 Effusion, unspecified shoulder: Secondary | ICD-10-CM | POA: Diagnosis not present

## 2014-06-19 DIAGNOSIS — M542 Cervicalgia: Secondary | ICD-10-CM

## 2014-06-19 DIAGNOSIS — R519 Headache, unspecified: Secondary | ICD-10-CM

## 2014-07-02 ENCOUNTER — Telehealth: Payer: Self-pay | Admitting: Family Medicine

## 2014-07-02 NOTE — Telephone Encounter (Signed)
Patient scheduled for tomorrow to be seen for evaluation

## 2014-07-02 NOTE — Telephone Encounter (Signed)
Called patient and left message for them to return call at the office   

## 2014-07-03 ENCOUNTER — Encounter: Payer: Self-pay | Admitting: Family Medicine

## 2014-07-03 ENCOUNTER — Ambulatory Visit (INDEPENDENT_AMBULATORY_CARE_PROVIDER_SITE_OTHER): Payer: BC Managed Care – PPO | Admitting: Family Medicine

## 2014-07-03 VITALS — BP 108/74 | HR 100 | Resp 18 | Ht 62.0 in | Wt 182.1 lb

## 2014-07-03 DIAGNOSIS — M542 Cervicalgia: Secondary | ICD-10-CM | POA: Insufficient documentation

## 2014-07-03 DIAGNOSIS — Z23 Encounter for immunization: Secondary | ICD-10-CM

## 2014-07-03 DIAGNOSIS — M546 Pain in thoracic spine: Secondary | ICD-10-CM

## 2014-07-03 DIAGNOSIS — M549 Dorsalgia, unspecified: Secondary | ICD-10-CM

## 2014-07-03 DIAGNOSIS — R071 Chest pain on breathing: Secondary | ICD-10-CM

## 2014-07-03 DIAGNOSIS — R0789 Other chest pain: Secondary | ICD-10-CM

## 2014-07-03 DIAGNOSIS — G8929 Other chronic pain: Secondary | ICD-10-CM

## 2014-07-03 DIAGNOSIS — R51 Headache: Secondary | ICD-10-CM

## 2014-07-03 MED ORDER — GABAPENTIN 100 MG PO CAPS: 100.0000 mg | ORAL_CAPSULE | Freq: Every day | ORAL | Status: DC
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
## 2014-07-04 MED ORDER — KETOROLAC TROMETHAMINE 60 MG/2ML IM SOLN
60.0000 mg | Freq: Once | INTRAMUSCULAR | Status: AC
  Administered 2014-07-04: 60 mg via INTRAMUSCULAR

## 2014-07-08 ENCOUNTER — Encounter: Payer: Self-pay | Admitting: Family Medicine

## 2014-07-08 DIAGNOSIS — G8929 Other chronic pain: Secondary | ICD-10-CM | POA: Insufficient documentation

## 2014-07-08 DIAGNOSIS — Z23 Encounter for immunization: Secondary | ICD-10-CM | POA: Insufficient documentation

## 2014-07-08 DIAGNOSIS — M549 Dorsalgia, unspecified: Secondary | ICD-10-CM | POA: Insufficient documentation

## 2014-07-08 DIAGNOSIS — M542 Cervicalgia: Secondary | ICD-10-CM | POA: Insufficient documentation
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
## 2014-07-08 NOTE — Assessment & Plan Note (Signed)
Vaccine administered at visit.  
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
## 2014-07-11 ENCOUNTER — Other Ambulatory Visit: Payer: Self-pay

## 2014-07-11 MED ORDER — LORAZEPAM 1 MG PO TABS: ORAL_TABLET | ORAL | Status: DC

## 2014-07-12 ENCOUNTER — Ambulatory Visit (HOSPITAL_COMMUNITY)
Admission: RE | Admit: 2014-07-12 | Discharge: 2014-07-12 | Disposition: A | Discharge status: Home / Self Care | Payer: BC Managed Care – PPO | Source: Ambulatory Visit | Attending: Family Medicine | Admitting: Family Medicine

## 2014-07-12 ENCOUNTER — Other Ambulatory Visit: Payer: Self-pay | Admitting: Family Medicine

## 2014-07-12 ENCOUNTER — Encounter (HOSPITAL_COMMUNITY): Payer: Self-pay

## 2014-07-12 DIAGNOSIS — D1809 Hemangioma of other sites: Secondary | ICD-10-CM | POA: Diagnosis not present

## 2014-07-12 DIAGNOSIS — M5126 Other intervertebral disc displacement, lumbar region: Secondary | ICD-10-CM | POA: Diagnosis not present

## 2014-07-12 DIAGNOSIS — M542 Cervicalgia: Secondary | ICD-10-CM | POA: Diagnosis not present

## 2014-07-12 DIAGNOSIS — M25519 Pain in unspecified shoulder: Secondary | ICD-10-CM | POA: Insufficient documentation

## 2014-07-12 DIAGNOSIS — R51 Headache: Secondary | ICD-10-CM | POA: Diagnosis not present

## 2014-07-12 DIAGNOSIS — M5380 Other specified dorsopathies, site unspecified: Secondary | ICD-10-CM | POA: Insufficient documentation

## 2014-07-12 DIAGNOSIS — M5020 Other cervical disc displacement, unspecified cervical region: Secondary | ICD-10-CM | POA: Diagnosis not present

## 2014-07-12 DIAGNOSIS — M546 Pain in thoracic spine: Secondary | ICD-10-CM

## 2014-07-12 DIAGNOSIS — G8929 Other chronic pain: Secondary | ICD-10-CM

## 2014-07-12 DIAGNOSIS — M549 Dorsalgia, unspecified: Secondary | ICD-10-CM

## 2014-07-16 ENCOUNTER — Encounter: Payer: Self-pay | Admitting: Family Medicine

## 2014-07-16 ENCOUNTER — Other Ambulatory Visit: Payer: Self-pay

## 2014-07-16 DIAGNOSIS — M546 Pain in thoracic spine: Secondary | ICD-10-CM

## 2014-07-16 DIAGNOSIS — M542 Cervicalgia: Secondary | ICD-10-CM

## 2014-08-14 ENCOUNTER — Ambulatory Visit: Payer: BC Managed Care – PPO | Admitting: Family Medicine

## 2014-08-21 ENCOUNTER — Ambulatory Visit (INDEPENDENT_AMBULATORY_CARE_PROVIDER_SITE_OTHER): Payer: BC Managed Care – PPO | Admitting: Family Medicine

## 2014-08-21 ENCOUNTER — Encounter: Payer: Self-pay | Admitting: Family Medicine

## 2014-08-21 VITALS — BP 108/70 | HR 93 | Resp 18 | Ht 62.0 in | Wt 177.1 lb

## 2014-08-21 DIAGNOSIS — Z23 Encounter for immunization: Secondary | ICD-10-CM

## 2014-08-21 DIAGNOSIS — E559 Vitamin D deficiency, unspecified: Secondary | ICD-10-CM

## 2014-08-21 DIAGNOSIS — N920 Excessive and frequent menstruation with regular cycle: Secondary | ICD-10-CM

## 2014-08-21 DIAGNOSIS — G43109 Migraine with aura, not intractable, without status migrainosus: Secondary | ICD-10-CM

## 2014-08-21 DIAGNOSIS — F17200 Nicotine dependence, unspecified, uncomplicated: Secondary | ICD-10-CM

## 2014-08-21 DIAGNOSIS — M546 Pain in thoracic spine: Secondary | ICD-10-CM

## 2014-08-21 DIAGNOSIS — D509 Iron deficiency anemia, unspecified: Secondary | ICD-10-CM

## 2014-08-21 DIAGNOSIS — E669 Obesity, unspecified: Secondary | ICD-10-CM

## 2014-08-21 LAB — CBC WITH DIFFERENTIAL/PLATELET
Basophils Absolute: 0.1 10*3/uL (ref 0.0–0.1)
Basophils Relative: 1 % (ref 0–1)
Eosinophils Absolute: 0.5 10*3/uL (ref 0.0–0.7)
Eosinophils Relative: 4 % (ref 0–5)
HCT: 39.8 % (ref 36.0–46.0)
Hemoglobin: 13.7 g/dL (ref 12.0–15.0)
Lymphocytes Relative: 22 % (ref 12–46)
Lymphs Abs: 2.8 10*3/uL (ref 0.7–4.0)
MCH: 29.4 pg (ref 26.0–34.0)
MCHC: 34.4 g/dL (ref 30.0–36.0)
MCV: 85.4 fL (ref 78.0–100.0)
Monocytes Absolute: 0.5 10*3/uL (ref 0.1–1.0)
Monocytes Relative: 4 % (ref 3–12)
Neutro Abs: 8.8 10*3/uL — ABNORMAL HIGH (ref 1.7–7.7)
Neutrophils Relative %: 69 % (ref 43–77)
Platelets: 412 10*3/uL — ABNORMAL HIGH (ref 150–400)
RBC: 4.66 MIL/uL (ref 3.87–5.11)
RDW: 14.1 % (ref 11.5–15.5)
WBC: 12.8 10*3/uL — ABNORMAL HIGH (ref 4.0–10.5)

## 2014-08-21 NOTE — Assessment & Plan Note (Addendum)
3 month h/o frequent heavy menses with fatigue, check CNB , iron and ferritin,refer to gyne for management
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
## 2014-08-21 NOTE — Patient Instructions (Signed)
F/U n  December as before, call if you need me sooner  TdAP today  CBC, iron and ferritin and vit D level today  You are referred to gynecology for help with menses  Glad that headaches and chest wall pain is improved  I will get the additional information to you that we discussed to you on My chart  Continue to work on smoking cessation, quitting wlll improove your health, call [facility] on opngoing weight loss, commit to daily exercise and continue to monitor food intake

## 2014-08-21 NOTE — Assessment & Plan Note (Signed)
Vaccine administered at visit.  

## 2014-08-22 ENCOUNTER — Other Ambulatory Visit: Payer: Self-pay | Admitting: Family Medicine

## 2014-08-22 ENCOUNTER — Encounter: Payer: Self-pay | Admitting: Family Medicine

## 2014-08-22 DIAGNOSIS — D500 Iron deficiency anemia secondary to blood loss (chronic): Secondary | ICD-10-CM

## 2014-08-22 DIAGNOSIS — D508 Other iron deficiency anemias: Secondary | ICD-10-CM

## 2014-08-22 LAB — VITAMIN D 25 HYDROXY (VIT D DEFICIENCY, FRACTURES): Vit D, 25-Hydroxy: 28 ng/mL — ABNORMAL LOW (ref 30–89)

## 2014-08-22 LAB — FERRITIN: Ferritin: 7 ng/mL — ABNORMAL LOW (ref 10–291)

## 2014-08-22 LAB — IRON: Iron: 28 ug/dL — ABNORMAL LOW (ref 42–145)

## 2014-08-22 NOTE — Assessment & Plan Note (Signed)
Improved with gabapentin, has neurosurg eval upcoming

## 2014-08-22 NOTE — Assessment & Plan Note (Signed)
Improved. Pt applauded on succesful weight loss through lifestyle change, and encouraged to continue same. Weight loss goal set for the next several months. She is to commit to more regular walking , which she will do outside now tat it is Fall

## 2014-08-22 NOTE — Assessment & Plan Note (Signed)
Updated lab needed.  

## 2014-08-22 NOTE — Assessment & Plan Note (Signed)
Patient counseled for approximately 5 minutes regarding the health risks of ongoing nicotine use, specifically all types of cancer, heart disease, stroke and respiratory failure. The options available for help with cessation ,the behavioral changes to assist the process, and the option to either gradully reduce usage  Or abruptly stop.is also discussed. Pt is also encouraged to set specific goals in number of cigarettes used daily, as well as to set a quit date.Unwilling to set a date at visit [facility] being penalised as afr as cost of health ins is concerned due to ongoing nicotine so this is added incentive to stop smoking

## 2014-08-22 NOTE — Assessment & Plan Note (Signed)
Improved  As far as severity and frequency are concerned, continue current management

## 2014-08-27 ENCOUNTER — Ambulatory Visit: Payer: Self-pay | Admitting: Gynecology

## 2014-08-28 ENCOUNTER — Encounter (HOSPITAL_COMMUNITY): Payer: BC Managed Care – PPO | Attending: Hematology and Oncology

## 2014-08-28 ENCOUNTER — Encounter (HOSPITAL_COMMUNITY): Payer: Self-pay

## 2014-08-28 VITALS — BP 114/72 | HR 90 | Temp 99.0°F | Resp 14 | Ht 62.0 in | Wt 177.4 lb

## 2014-08-28 DIAGNOSIS — D500 Iron deficiency anemia secondary to blood loss (chronic): Secondary | ICD-10-CM

## 2014-08-28 DIAGNOSIS — E611 Iron deficiency: Secondary | ICD-10-CM

## 2014-08-28 DIAGNOSIS — K909 Intestinal malabsorption, unspecified: Secondary | ICD-10-CM

## 2014-08-28 NOTE — Patient Instructions (Signed)
[place] [facility] Discharge Instructions  RECOMMENDATIONS MADE BY THE CONSULTANT AND ANY TEST RESULTS WILL BE SENT TO YOUR REFERRING PHYSICIAN.  EXAM FINDINGS BY THE PHYSICIAN TODAY AND SIGNS OR SYMPTOMS TO REPORT TO CLINIC OR PRIMARY PHYSICIAN: Exam and findings as discussed by Dr. Barnet Glasgow.  Your ferritin level is low and we will give you feraheme.  You will receive a total of 1020 mg in 2 divided doses.    INSTRUCTIONS/FOLLOW-UP: Feraheme as scheduled Office visit and labs in 6 weeks.  Thank you for choosing [facility] to provide your oncology and hematology care.  To afford each patient quality time with our providers, please arrive at least 15 minutes before your scheduled appointment time.  With your help, our goal is to use those 15 minutes to complete the necessary work-up to ensure our physicians have the information they need to help with your evaluation and healthcare recommendations.    Effective January 1st, 2014, we ask that you re-schedule your appointment with our physicians should you arrive 10 or more minutes late for your appointment.  We strive to give you quality time with our providers, and arriving late affects you and other patients whose appointments are after yours.    Again, thank you for choosing [facility].  Our hope is that these requests will decrease the amount of time that you wait before being seen by our physicians.       _____________________________________________________________  Should you have questions after your visit to [facility], please contact our office at [phone] between the hours of 8:30 a.m. and 4:30 p.m.  Voicemails left after 4:30 p.m. will not be returned until the following business day.  For prescription refill requests, have your pharmacy contact our office with your prescription refill request.    _______________________________________________________________  We hope  that we have given you very good care.  You may receive a patient satisfaction survey in the mail, please complete it and return it as soon as possible.  We value your feedback!  _______________________________________________________________  Have you asked about our STAR program?  STAR stands for Survivorship Training and Rehabilitation, and this is a nationally recognized cancer care program that focuses on survivorship and rehabilitation.  Cancer and cancer treatments may cause problems, such as, pain, making you feel tired and keeping you from doing the things that you need or want to do. Cancer rehabilitation can help. Our goal is to reduce these troubling effects and help you have the best quality of life possible.  You may receive a survey from a nurse that asks questions about your current state of health.  Based on the survey results, all eligible patients will be referred to the [facility] program for an evaluation so we can better serve you!  A frequently asked questions sheet is available upon request. Ferumoxytol injection What is this medicine? FERUMOXYTOL is an iron complex. Iron is used to make healthy red blood cells, which carry oxygen and nutrients throughout the body. This medicine is used to treat iron deficiency anemia in people with chronic kidney disease. This medicine may be used for other purposes; ask your health care provider or pharmacist if you have questions. COMMON BRAND NAME(S): Feraheme What should I tell my health care provider before I take this medicine? They need to know if you have any of these conditions: -anemia not caused by low iron levels -high levels of iron in the blood -magnetic resonance imaging (MRI)  test scheduled -an unusual or allergic reaction to iron, other medicines, foods, dyes, or preservatives -pregnant or trying to get pregnant -breast-feeding How should I use this medicine? This medicine is for injection into a vein. It is given by a  health care professional in a hospital or clinic setting. Talk to your pediatrician regarding the use of this medicine in children. Special care may be needed. Overdosage: If you think you've taken too much of this medicine contact a poison control center or emergency room at once. Overdosage: If you think you have taken too much of this medicine contact a poison control center or emergency room at once. NOTE: This medicine is only for you. Do not share this medicine with others. What if I miss a dose? It is important not to miss your dose. Call your doctor or health care professional if you are unable to keep an appointment. What may interact with this medicine? This medicine may interact with the following medications: -other iron products This list may not describe all possible interactions. Give your health care provider a list of all the medicines, herbs, non-prescription drugs, or dietary supplements you use. Also tell them if you smoke, drink alcohol, or use illegal drugs. Some items may interact with your medicine. What should I watch for while using this medicine? Visit your doctor or healthcare professional regularly. Tell your doctor or healthcare professional if your symptoms do not start to get better or if they get worse. You may need blood work done while you are taking this medicine. You may need to follow a special diet. Talk to your doctor. Foods that contain iron include: whole grains/cereals, dried fruits, beans, or peas, leafy green vegetables, and organ meats (liver, kidney). What side effects may I notice from receiving this medicine? Side effects that you should report to your doctor or health care professional as soon as possible: -allergic reactions like skin rash, itching or hives, swelling of the face, lips, or tongue -breathing problems -changes in blood pressure -feeling faint or lightheaded, falls -fever or chills -flushing, sweating, or hot feelings -swelling of  the ankles or feet Side effects that usually do not require medical attention (Report these to your doctor or health care professional if they continue or are bothersome.): -diarrhea -headache -nausea, vomiting -stomach pain This list may not describe all possible side effects. Call your doctor for medical advice about side effects. You may report side effects to FDA at 1-800-FDA-1088. Where should I keep my medicine? This drug is given in a hospital or clinic and will not be stored at home. NOTE: This sheet is a summary. It may not cover all possible information. If you have questions about this medicine, talk to your doctor, pharmacist, or health care provider.  2015, Elsevier/Gold Standard. (2012-06-03 15:23:36)

## 2014-08-28 NOTE — Progress Notes (Signed)
[facility]  OFFICE PROGRESS NOTE  Alejandra Nakayama, MD [address] [place] [address]  DIAGNOSIS: No diagnosis found.  No chief complaint on file.   CURRENT THERAPY: Treated in the past with Invermectin for Strongyloides giving 12 mg every 2 weeks for 4 treatments with the last treatment on 09/15/2013 in addition to intravenous Feraheme on 07/12/2013 prior deficiency due to malabsorption, status post gastric bypass surgery.  INTERVAL HISTORY: Alejandra Barr 28 y.o. 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 Her husband had undergone a kidney transplant. At the time of her last visit on 10/10/2013 she was advised to take invernectin 12 mg weekly for 6 weeks with an office appointment made 4 weeks later which was never . Other appointments on 11/09/2013 and 11/23/2013 were both no-shows. She has been followed by family physician who record significant vaginal bleeding. She was advised consultation at [facility] where a diagnosis of scurvy was made. Multivitamin therapy has resulted in reversal of her rash. She did undergo EGD and colonoscopy on 11/21/2013 at which time external hemorrhoids were found and a small gastric pouch with gastritis and wide open gastro-jejunostomy was found without ulceration. H pylori was negative as was workup for parasites. She is referred back today by her family physician for possible additional intravenous iron therapy. She has been administrating twice a month and has an appointment later this month with a gynecologist. She denies any further diarrhea, melena, hematochezia, hematuria, epistaxes, or hemoptysis.  MEDICAL HISTORY: Past Medical History  Diagnosis Date  . Asthma   . Chronic anxiety   . Chronic  depression   . Obesity   . Iron deficiency   . 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 Atrial flutter     Status post RFA by Dr. Lovena Le in 2004    INTERIM HISTORY: has Obesity (BMI 30.0-34.9); Iron deficiency anemia; BIPOLAR II DISORDER; ANXIETY, CHRONIC; NICOTINE ADDICTION; CLUSTER HEADACHE; Atrial flutter; ASTHMA, UNSPECIFIED, UNSPECIFIED STATUS; PALPITATIONS; Depression; Vitamin D deficiency; Insomnia; Rash and nonspecific skin eruption; GAD (generalized anxiety disorder); Low vitamin B12 level; Abnormal antibody titer--Strongyloides; Cutaneous strongyloidiasis; H/O gastric bypass; Abdominal  pain, other specified site; Nipple discharge in female; Change in stool; Guaiac positive stools; Preoperative cardiovascular examination; Migraine headache with aura; Anterior chest wall pain; Neck pain on right side; Thoracic spine pain; Chronic neck and back pain; Need for prophylactic vaccination and inoculation against influenza; Polymenorrhea; and Need for Tdap vaccination on her problem list.    ALLERGIES:  is allergic to ciprofloxacin; morphine; morphine and related; and penicillins.  MEDICATIONS: has a current medication list which includes the following prescription(s): albuterol, ascorbic acid, butalbital-acetaminophen-caffeine, duloxetine, flintstones complete, gabapentin, lorazepam, metoprolol succinate, rizatriptan, topiramate, and vitamin d (ergocalciferol).  SURGICAL HISTORY:  Past Surgical History  Procedure Laterality Date  . Cholecystectomy  2003  . Tonsillectomy  1990  . Bilateral eustachian tube placement on 4 different occasions    . Gastric bypass  2006  . Colonoscopy N/A 11/29/2013    Procedure: COLONOSCOPY;  Surgeon: Rogene Houston, MD;  Location: [facility];  Service: Endoscopy;  Laterality: N/A;  120-rescheduled to [address] notified pt    FAMILY HISTORY: family history includes Breast cancer in her mother; Fibromyalgia in her mother; Heart defect in her mother; Hypothyroidism  in  her mother; Obesity in her mother.  SOCIAL HISTORY:  reports that she has been smoking Cigarettes.  She started smoking about 17 months ago. She has been smoking about 1.00 pack per day. She has never used smokeless tobacco. She reports that she does not drink alcohol or use illicit drugs.  REVIEW OF SYSTEMS:  Other than that discussed above is noncontributory.  PHYSICAL EXAMINATION: ECOG PERFORMANCE STATUS: 1 - Symptomatic but completely ambulatory  There were no vitals taken for this visit.  GENERAL:alert, no distress and comfortable SKIN: skin color, texture, turgor are normal, no rashes or significant lesions EYES: PERLA; Conjunctiva are pink and non-injected, sclera clear SINUSES: No redness or tenderness over maxillary or ethmoid sinuses OROPHARYNX:no exudate, no erythema on lips, buccal mucosa, or tongue. Implants now in place in her mouth replacing previous dental caries. NECK: supple, thyroid normal size, non-tender, without nodularity. No masses CHEST: Normal AP diameter with no breast masses. LYMPH:  no palpable lymphadenopathy in the cervical, axillary or inguinal LUNGS: clear to auscultation and percussion with normal breathing effort HEART: regular rate & rhythm and no murmurs. ABDOMEN:abdomen soft, non-tender and normal bowel sounds MUSCULOSKELETAL:no cyanosis of digits and no clubbing. Range of motion normal.  NEURO: alert & oriented x 3 with fluent speech, no focal motor/sensory deficits   LABORATORY DATA: Office Visit on 08/21/2014  Component Date Value Ref Range Status  . WBC 08/21/2014 12.8* 4.0 - 10.5 K/uL Final  . RBC 08/21/2014 4.66  3.87 - 5.11 MIL/uL Final  . Hemoglobin 08/21/2014 13.7  12.0 - 15.0 g/dL Final  . HCT 08/21/2014 39.8  36.0 - 46.0 % Final  . MCV 08/21/2014 85.4  78.0 - 100.0 fL Final  . MCH 08/21/2014 29.4  26.0 - 34.0 pg Final  . MCHC 08/21/2014 34.4  30.0 - 36.0 g/dL Final  . RDW 08/21/2014 14.1  11.5 - 15.5 % Final  . Platelets  08/21/2014 412* 150 - 400 K/uL Final  . Neutrophils Relative % 08/21/2014 69  43 - 77 % Final  . Neutro Abs 08/21/2014 8.8* 1.7 - 7.7 K/uL Final  . Lymphocytes Relative 08/21/2014 22  12 - 46 % Final  . Lymphs Abs 08/21/2014 2.8  0.7 - 4.0 K/uL Final  . Monocytes Relative 08/21/2014 4  3 - 12 % Final  . Monocytes Absolute 08/21/2014 0.5  0.1 - 1.0 K/uL Final  . Eosinophils Relative 08/21/2014 4  0 - 5 % Final  . Eosinophils Absolute 08/21/2014 0.5  0.0 - 0.7 K/uL Final  . Basophils Relative 08/21/2014 1  0 - 1 % Final  . Basophils Absolute 08/21/2014 0.1  0.0 - 0.1 K/uL Final  . Smear Review 08/21/2014 Criteria for review not met   Final  . Ferritin 08/21/2014 7* 10 - 291 ng/mL Final  . Iron 08/21/2014 28* 42 - 145 ug/dL Final  . Vit D, 25-Hydroxy 08/21/2014 28* 30 - 89 ng/mL Final   Comment: This assay accurately quantifies Vitamin D, which is the sum of the                          25-Hydroxy forms of Vitamin D2 and D3.  Studies have shown that the                          optimum concentration of 25-Hydroxy Vitamin D is 30 ng/mL or higher.  Concentrations of Vitamin D between 20 and 29 ng/mL are considered to                          be insufficient and concentrations less than 20 ng/mL are considered                          to be deficient for Vitamin D.    PATHOLOGY: No new pathology.  Urinalysis    Component Value Date/Time   COLORURINE YELLOW 12/17/2006 0411   APPEARANCEUR CLEAR 12/17/2006 0411   LABSPEC 1.015 12/17/2006 0411   PHURINE 7.5 12/17/2006 0411   GLUCOSEU NEG mg/dL 12/17/2006 0411   BILIRUBINUR NEG 12/17/2006 0411   KETONESUR NEG mg/dL 12/17/2006 0411   PROTEINUR NEG mg/dL 12/17/2006 0411   UROBILINOGEN 0.2 12/17/2006 0411   NITRITE NEG 12/17/2006 0411   LEUKOCYTESUR NEG 12/17/2006 0411    RADIOGRAPHIC STUDIES: No results found.  ASSESSMENT:  1. Iron deficiency without anemia, status post gastric bypass surgery with menorrhagia. 2.  Previous history of parasitic infestation, resolved. 3. Scurvy, on vitamin C supplements.   PLAN:  #1. IV Feraheme on 08/30/2014 and 09/06/2014. #2. Follow-up in 6 weeks with CBC and ferritin.   All questions were answered. The patient knows to call the clinic with any problems, questions or concerns. We can certainly see the patient much sooner if necessary.   I spent 25 minutes counseling the patient face to face. The total time spent in the appointment was 30 minutes.    Doroteo Bradford, MD 08/28/2014 4:00 PM  DISCLAIMER:  This note was dictated with voice recognition software.  Similar sounding words can inadvertently be transcribed inaccurately and may not be corrected upon review.

## 2014-08-30 ENCOUNTER — Encounter (HOSPITAL_COMMUNITY): Payer: Self-pay

## 2014-08-30 ENCOUNTER — Encounter (HOSPITAL_BASED_OUTPATIENT_CLINIC_OR_DEPARTMENT_OTHER): Payer: BC Managed Care – PPO

## 2014-08-30 VITALS — BP 98/74 | HR 85 | Temp 98.6°F | Resp 16 | Wt 180.0 lb

## 2014-08-30 DIAGNOSIS — K909 Intestinal malabsorption, unspecified: Secondary | ICD-10-CM

## 2014-08-30 DIAGNOSIS — E611 Iron deficiency: Secondary | ICD-10-CM

## 2014-08-30 DIAGNOSIS — E538 Deficiency of other specified B group vitamins: Secondary | ICD-10-CM

## 2014-08-30 MED ORDER — SODIUM CHLORIDE 0.9 % IV SOLN
510.0000 mg | Freq: Once | INTRAVENOUS | Status: AC
Start: 2014-08-30 — End: 2014-08-30
  Administered 2014-08-30: 510 mg via INTRAVENOUS
  Filled 2014-08-30: qty 17

## 2014-08-30 MED ORDER — SODIUM CHLORIDE 0.9 % IV SOLN
INTRAVENOUS | Status: DC
Start: 2014-08-30 — End: 2014-08-30
  Administered 2014-08-30: 15:00:00 via INTRAVENOUS

## 2014-08-30 NOTE — Progress Notes (Signed)
Patient tolerated treatment well.

## 2014-08-30 NOTE — Patient Instructions (Signed)
[facility] Discharge Instructions  RECOMMENDATIONS MADE BY THE CONSULTANT AND ANY TEST RESULTS WILL BE SENT TO YOUR REFERRING PHYSICIAN.  You were given Feraheme today, please follow up as scheduled. Call for any questions or concerns.   Thank you for choosing [facility] to provide your oncology and hematology care.  To afford each patient quality time with our providers, please arrive at least 15 minutes before your scheduled appointment time.  With your help, our goal is to use those 15 minutes to complete the necessary work-up to ensure our physicians have the information they need to help with your evaluation and healthcare recommendations.    Effective January 1st, 2014, we ask that you re-schedule your appointment with our physicians should you arrive 10 or more minutes late for your appointment.  We strive to give you quality time with our providers, and arriving late affects you and other patients whose appointments are after yours.    Again, thank you for choosing [facility].  Our hope is that these requests will decrease the amount of time that you wait before being seen by our physicians.       _____________________________________________________________  Should you have questions after your visit to [facility], please contact our office at [phone] between the hours of 8:30 a.m. and 4:30 p.m.  Voicemails left after 4:30 p.m. will not be returned until the following business day.  For prescription refill requests, have your pharmacy contact our office with your prescription refill request.    _______________________________________________________________  We hope that we have given you very good care.  You may receive a patient satisfaction survey in the mail, please complete it and return it as soon as possible.  We value your feedback!  _______________________________________________________________  Have you  asked about our STAR program?  STAR stands for Survivorship Training and Rehabilitation, and this is a nationally recognized cancer care program that focuses on survivorship and rehabilitation.  Cancer and cancer treatments may cause problems, such as, pain, making you feel tired and keeping you from doing the things that you need or want to do. Cancer rehabilitation can help. Our goal is to reduce these troubling effects and help you have the best quality of life possible.  You may receive a survey from a nurse that asks questions about your current state of health.  Based on the survey results, all eligible patients will be referred to the [facility] program for an evaluation so we can better serve you!  A frequently asked questions sheet is available upon request.

## 2014-08-31 ENCOUNTER — Ambulatory Visit (HOSPITAL_COMMUNITY): Payer: BC Managed Care – PPO

## 2014-08-31 NOTE — Progress Notes (Signed)
At end of the patient infusion, infusion pump was beeping.  Rn entered room, patient had altered the infusion pump.

## 2014-09-05 ENCOUNTER — Ambulatory Visit (HOSPITAL_COMMUNITY): Payer: BC Managed Care – PPO

## 2014-09-06 ENCOUNTER — Encounter (HOSPITAL_COMMUNITY): Payer: BC Managed Care – PPO | Attending: Hematology and Oncology

## 2014-09-06 DIAGNOSIS — K909 Intestinal malabsorption, unspecified: Secondary | ICD-10-CM

## 2014-09-06 DIAGNOSIS — E611 Iron deficiency: Secondary | ICD-10-CM

## 2014-09-06 DIAGNOSIS — E538 Deficiency of other specified B group vitamins: Secondary | ICD-10-CM | POA: Insufficient documentation

## 2014-09-06 MED ORDER — SODIUM CHLORIDE 0.9 % IV SOLN
510.0000 mg | Freq: Once | INTRAVENOUS | Status: AC
  Administered 2014-09-06: 510 mg via INTRAVENOUS
  Filled 2014-09-06: qty 17

## 2014-09-06 MED ORDER — SODIUM CHLORIDE 0.9 % IV SOLN
Freq: Once | INTRAVENOUS | Status: AC
  Administered 2014-09-06: 14:00:00 via INTRAVENOUS

## 2014-09-06 NOTE — Progress Notes (Signed)
Tolerated infusion well. 

## 2014-09-06 NOTE — Patient Instructions (Signed)
[facility] Discharge Instructions  RECOMMENDATIONS MADE BY THE CONSULTANT AND ANY TEST RESULTS WILL BE SENT TO YOUR REFERRING PHYSICIAN.  INSTRUCTIONS/FOLLOW-UP: IV Feraheme 510 mg today. (2 of 2) Return as scheduled 10/09/14 for lab work and MD appointment.  Thank you for choosing [facility] to provide your oncology and hematology care.  To afford each patient quality time with our providers, please arrive at least 15 minutes before your scheduled appointment time.  With your help, our goal is to use those 15 minutes to complete the necessary work-up to ensure our physicians have the information they need to help with your evaluation and healthcare recommendations.    Effective January 1st, 2014, we ask that you re-schedule your appointment with our physicians should you arrive 10 or more minutes late for your appointment.  We strive to give you quality time with our providers, and arriving late affects you and other patients whose appointments are after yours.    Again, thank you for choosing [facility].  Our hope is that these requests will decrease the amount of time that you wait before being seen by our physicians.       _____________________________________________________________  Should you have questions after your visit to [facility], please contact our office at [phone] between the hours of 8:30 a.m. and 4:30 p.m.  Voicemails left after 4:30 p.m. will not be returned until the following business day.  For prescription refill requests, have your pharmacy contact our office with your prescription refill request.    _______________________________________________________________  We hope that we have given you very good care.  You may receive a patient satisfaction survey in the mail, please complete it and return it as soon as possible.  We value your  feedback!  _______________________________________________________________  Have you asked about our STAR program?  STAR stands for Survivorship Training and Rehabilitation, and this is a nationally recognized cancer care program that focuses on survivorship and rehabilitation.  Cancer and cancer treatments may cause problems, such as, pain, making you feel tired and keeping you from doing the things that you need or want to do. Cancer rehabilitation can help. Our goal is to reduce these troubling effects and help you have the best quality of life possible.  You may receive a survey from a nurse that asks questions about your current state of health.  Based on the survey results, all eligible patients will be referred to the [facility] program for an evaluation so we can better serve you!  A frequently asked questions sheet is available upon request.

## 2014-09-13 ENCOUNTER — Other Ambulatory Visit: Payer: Self-pay | Admitting: Family Medicine

## 2014-09-20 LAB — US OB FOLLOW UP

## 2014-10-04 ENCOUNTER — Other Ambulatory Visit (HOSPITAL_COMMUNITY): Payer: Self-pay

## 2014-10-09 ENCOUNTER — Ambulatory Visit (HOSPITAL_COMMUNITY): Payer: BC Managed Care – PPO

## 2014-10-09 ENCOUNTER — Other Ambulatory Visit (HOSPITAL_COMMUNITY): Payer: BC Managed Care – PPO

## 2014-10-15 ENCOUNTER — Other Ambulatory Visit: Payer: Self-pay | Admitting: Obstetrics & Gynecology

## 2014-10-15 DIAGNOSIS — N6452 Nipple discharge: Secondary | ICD-10-CM

## 2014-10-16 ENCOUNTER — Ambulatory Visit: Payer: BC Managed Care – PPO | Admitting: Family Medicine

## 2014-10-19 ENCOUNTER — Encounter (HOSPITAL_COMMUNITY): Payer: BC Managed Care – PPO | Attending: Hematology and Oncology | Admitting: Hematology & Oncology

## 2014-10-19 ENCOUNTER — Encounter (HOSPITAL_COMMUNITY): Payer: Self-pay | Admitting: Hematology & Oncology

## 2014-10-19 ENCOUNTER — Encounter (HOSPITAL_BASED_OUTPATIENT_CLINIC_OR_DEPARTMENT_OTHER): Payer: BC Managed Care – PPO

## 2014-10-19 VITALS — BP 117/61 | HR 95 | Temp 98.8°F | Resp 20

## 2014-10-19 DIAGNOSIS — E538 Deficiency of other specified B group vitamins: Secondary | ICD-10-CM | POA: Diagnosis present

## 2014-10-19 DIAGNOSIS — Z9884 Bariatric surgery status: Secondary | ICD-10-CM

## 2014-10-19 DIAGNOSIS — N920 Excessive and frequent menstruation with regular cycle: Secondary | ICD-10-CM

## 2014-10-19 DIAGNOSIS — D509 Iron deficiency anemia, unspecified: Secondary | ICD-10-CM

## 2014-10-19 LAB — CBC WITH DIFFERENTIAL/PLATELET
Basophils Absolute: 0.1 10*3/uL (ref 0.0–0.1)
Basophils Relative: 1 % (ref 0–1)
Eosinophils Absolute: 0.4 10*3/uL (ref 0.0–0.7)
Eosinophils Relative: 5 % (ref 0–5)
HCT: 40.2 % (ref 36.0–46.0)
Hemoglobin: 13.4 g/dL (ref 12.0–15.0)
Lymphocytes Relative: 28 % (ref 12–46)
Lymphs Abs: 2.1 10*3/uL (ref 0.7–4.0)
MCH: 30.9 pg (ref 26.0–34.0)
MCHC: 33.3 g/dL (ref 30.0–36.0)
MCV: 92.6 fL (ref 78.0–100.0)
Monocytes Absolute: 0.4 10*3/uL (ref 0.1–1.0)
Monocytes Relative: 5 % (ref 3–12)
Neutro Abs: 4.4 10*3/uL (ref 1.7–7.7)
Neutrophils Relative %: 61 % (ref 43–77)
Platelets: 250 10*3/uL (ref 150–400)
RBC: 4.34 MIL/uL (ref 3.87–5.11)
RDW: 14.8 % (ref 11.5–15.5)
WBC: 7.4 10*3/uL (ref 4.0–10.5)

## 2014-10-19 LAB — FERRITIN: Ferritin: 100 ng/mL (ref 10–291)

## 2014-10-19 LAB — VITAMIN B12: Vitamin B-12: 272 pg/mL (ref 211–911)

## 2014-10-19 NOTE — Assessment & Plan Note (Signed)
Her iron deficiency is multifactorial from her gastric bypass and menometrorrhagia.  She is currently symptomatic with increasing fatigue, sore tongue and cheilitis.  She has tried multiple oral iron preparations with no benefit.  I will call the patient on Monday with serum ferritin is available and will schedule her for IV replacement.  We will also work out a surveillance plan moving forward to prevent her from getting symptomatic.  I will schedule her at this point for 3 month follow-up with repeat labs as today.

## 2014-10-19 NOTE — Assessment & Plan Note (Signed)
Given her history of gastric bypass, will add a B12 today. She has intermittently been on B12 injections. She will be notified of the results when available and recommendations made at that time.

## 2014-10-19 NOTE — Patient Instructions (Signed)
[facility] Discharge Instructions  RECOMMENDATIONS MADE BY THE CONSULTANT AND ANY TEST RESULTS WILL BE SENT TO YOUR REFERRING PHYSICIAN.  We will notify you of your ferritin results Monday and schedule you for iron replacement.  Moving forward we can work out a routine lab schedule to follow your iron given that you have had a gastric bypass and do not absorb iron well and also have heavy irregular menstrual cycles. We will check your B12 today as well given your history of B12 deficiency. I will see you back in 3 months. We will give you a definitive lab follow-up schedule when you come in for your iron replacement Call with any new problems or concerns.    Thank you for choosing [facility] to provide your oncology and hematology care.  To afford each patient quality time with our providers, please arrive at least 15 minutes before your scheduled appointment time.  With your help, our goal is to use those 15 minutes to complete the necessary work-up to ensure our physicians have the information they need to help with your evaluation and healthcare recommendations.    Effective January 1st, 2014, we ask that you re-schedule your appointment with our physicians should you arrive 10 or more minutes late for your appointment.  We strive to give you quality time with our providers, and arriving late affects you and other patients whose appointments are after yours.    Again, thank you for choosing [facility].  Our hope is that these requests will decrease the amount of time that you wait before being seen by our physicians.       _____________________________________________________________  Should you have questions after your visit to [facility], please contact our office at [phone] between the hours of 8:30 a.m. and 5:00 p.m.  Voicemails left after 4:30 p.m. will not be returned until the following business day.  For  prescription refill requests, have your pharmacy contact our office with your prescription refill request.

## 2014-10-19 NOTE — Progress Notes (Signed)
Alejandra Nakayama, MD [address] [place] [address]  Gastric Bypass Iron deficiency anemia secondary to malabsorption Iron deficiency anemia secondary to menometrorrhagia B12 deficiency  CURRENT THERAPY: IV iron replacement  INTERVAL HISTORY: Alejandra Barr 28 y.o. female returns for routine follow-up of iron deficiency anemia. She has a history of gastric bypass surgery and irregular/heavy menses. She also has a history of B12 deficiency. She notes worsening fatigue, cheilitis and a sore tongue. These are her usual symptoms of iron deficiency.  She has no other major complaints today.  Past Medical History  Diagnosis Date  . Asthma   . Chronic anxiety   . Chronic depression   . Obesity   . Iron deficiency   . 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 Atrial flutter     Status post RFA by Dr. Lovena Le in 2004    has Obesity (BMI 30.0-34.9); Iron deficiency anemia; BIPOLAR II DISORDER; ANXIETY, CHRONIC; NICOTINE ADDICTION; CLUSTER HEADACHE; Atrial flutter; ASTHMA, UNSPECIFIED, UNSPECIFIED STATUS; PALPITATIONS; Depression; Vitamin D deficiency; Insomnia; Rash and nonspecific skin eruption; GAD (generalized anxiety disorder); Low vitamin B12 level; Abnormal antibody titer--Strongyloides; Cutaneous strongyloidiasis; H/O gastric bypass; Abdominal  pain, other specified site; Nipple discharge in female; Change in stool; Guaiac positive stools; Preoperative cardiovascular examination; Migraine headache with aura; Anterior chest wall pain; Neck pain on right side; Thoracic spine pain; Chronic neck and back pain; Need for prophylactic vaccination and inoculation against influenza; Polymenorrhea; Need for Tdap vaccination; and S/P gastric bypass on her problem list.     No history exists.     is allergic to ciprofloxacin; morphine; morphine and related; and penicillins.  [name] had no medications administered during this visit.  Past Surgical History  Procedure  Laterality Date  . Cholecystectomy  2003  . Tonsillectomy  1990  . Bilateral eustachian tube placement on 4 different occasions    . Gastric bypass  2006  . Colonoscopy N/A 11/29/2013    Procedure: COLONOSCOPY;  Surgeon: Rogene Houston, MD;  [facility];  Service: Endoscopy;  Laterality: N/A;  120-rescheduled to 300 Ann notified pt    Review of Systems  Constitutional: Positive for malaise/fatigue. Negative for fever, chills, weight loss and diaphoresis.  HENT: Negative for congestion, ear discharge, ear pain, hearing loss, nosebleeds, sore throat and tinnitus.        Sore tongue, cheilitis  Eyes: Negative for blurred vision, double vision, photophobia, pain, discharge and redness.  Respiratory: Negative for cough, hemoptysis, sputum production, shortness of breath, wheezing and stridor.   Cardiovascular: Negative for chest pain, palpitations, orthopnea, claudication, leg swelling and PND.  Gastrointestinal: Negative for heartburn, nausea, vomiting, abdominal pain, diarrhea, constipation, blood in stool and melena.       No pagophagia  Genitourinary: Negative for dysuria, urgency, frequency, hematuria and flank pain.       Irregular heavy menstrual cycles  Musculoskeletal: Negative for myalgias, back pain, joint pain, falls and neck pain.  Skin: Negative for itching and rash.  Neurological: Positive for headaches. Negative for dizziness, tingling, tremors, sensory change, speech change, focal weakness, seizures, loss of consciousness and weakness.  Endo/Heme/Allergies: Negative for environmental allergies and polydipsia. Does not bruise/bleed easily.  Psychiatric/Behavioral: Negative for depression, suicidal ideas, hallucinations, memory loss and substance abuse.    PHYSICAL EXAMINATION  ECOG PERFORMANCE STATUS: 1 - Symptomatic but completely ambulatory  Filed Vitals:   10/19/14 1100  BP: 117/61  Pulse: 95  Temp: 98.8 F (37.1 C)  Resp: 20    Physical Exam    Constitutional: She is oriented to person, place, and time and well-developed, well-nourished, and in no distress.  HENT:  Head: Normocephalic and atraumatic.  Nose: Nose normal.  Mouth/Throat: Oropharynx is clear and moist.  Cheilitis noted L>R  Eyes: Conjunctivae are normal. Pupils are equal, round, and reactive to light. No scleral icterus.  Neck: Normal range of motion. No tracheal deviation present. No thyromegaly present.  Cardiovascular: Normal rate and regular rhythm.   No murmur heard. Pulmonary/Chest: Effort normal and breath sounds normal. No respiratory distress. She has no wheezes. She has no rales.  Abdominal: Soft. She exhibits no distension and no mass. There is no tenderness. There is no rebound and no guarding.  Musculoskeletal: Normal range of motion. She exhibits no edema.  Lymphadenopathy:    She has no cervical adenopathy.  Neurological: She is alert and oriented to person, place, and time. No cranial nerve deficit.  Skin: Skin is warm and dry. No erythema.  Psychiatric: Mood, memory, affect and judgment normal.    LABORATORY DATA: CBC    Component Value Date/Time   WBC 7.4 10/19/2014 1137   RBC 4.34 10/19/2014 1137   RBC 4.71 04/13/2008 1652   HGB 13.4 10/19/2014 1137   HCT 40.2 10/19/2014 1137   PLT 250 10/19/2014 1137   MCV 92.6 10/19/2014 1137   MCH 30.9 10/19/2014 1137   MCHC 33.3 10/19/2014 1137   RDW 14.8 10/19/2014 1137   LYMPHSABS 2.1 10/19/2014 1137   MONOABS 0.4 10/19/2014 1137   EOSABS 0.4 10/19/2014 1137   BASOSABS 0.1 10/19/2014 1137    Iron/TIBC/Ferritin/ %Sat    Component Value Date/Time   IRON 28* 08/21/2014 1452   TIBC 306 08/02/2013 0842   FERRITIN 7* 08/21/2014 1452   IRONPCTSAT 29 08/02/2013 0842    PENDING LABS: Ferritin B12    ASSESSMENT/PLAN   Iron deficiency anemia Her iron deficiency is multifactorial from her gastric bypass and menometrorrhagia.  She is currently symptomatic with increasing fatigue, sore  tongue and cheilitis.  She has tried multiple oral iron preparations with no benefit.  I will call the patient on Monday with serum ferritin is available and will schedule her for IV replacement.  We will also work out a surveillance plan moving forward to prevent her from getting symptomatic.  I will schedule her at this point for 3 month follow-up with repeat labs as today.  Low vitamin B12 level Given her history of gastric bypass, will add a B12 today. She has intermittently been on B12 injections. She will be notified of the results when available and recommendations made at that time.  Polymenorrhea She will continue to work with her OB/GYN in [place].    All questions were answered. The patient knows to call the clinic with any problems, questions or concerns. We can certainly see the patient much sooner if necessary.   Molli Hazard 10/19/2014

## 2014-10-19 NOTE — Assessment & Plan Note (Signed)
She will continue to work with her OB/GYN in [place].

## 2014-10-19 NOTE — Progress Notes (Signed)
Labs for cbcd,ferr 

## 2014-10-29 ENCOUNTER — Ambulatory Visit
Admission: RE | Admit: 2014-10-29 | Discharge: 2014-10-29 | Disposition: A | Discharge status: Home / Self Care | Payer: BC Managed Care – PPO | Source: Ambulatory Visit | Attending: Obstetrics & Gynecology | Admitting: Obstetrics & Gynecology

## 2014-10-29 DIAGNOSIS — N6452 Nipple discharge: Secondary | ICD-10-CM

## 2014-11-14 ENCOUNTER — Other Ambulatory Visit: Payer: Self-pay | Admitting: Family Medicine

## 2014-11-22 ENCOUNTER — Ambulatory Visit: Payer: BC Managed Care – PPO | Admitting: Family Medicine

## 2014-12-05 ENCOUNTER — Encounter: Payer: Self-pay | Admitting: Family Medicine

## 2014-12-05 ENCOUNTER — Ambulatory Visit (INDEPENDENT_AMBULATORY_CARE_PROVIDER_SITE_OTHER): Payer: BLUE CROSS/BLUE SHIELD | Admitting: Family Medicine

## 2014-12-05 VITALS — BP 108/74 | HR 82 | Resp 16 | Ht 62.0 in | Wt 185.0 lb

## 2014-12-05 DIAGNOSIS — D509 Iron deficiency anemia, unspecified: Secondary | ICD-10-CM

## 2014-12-05 DIAGNOSIS — Z720 Tobacco use: Secondary | ICD-10-CM

## 2014-12-05 DIAGNOSIS — F17200 Nicotine dependence, unspecified, uncomplicated: Secondary | ICD-10-CM

## 2014-12-05 DIAGNOSIS — F32A Depression, unspecified: Secondary | ICD-10-CM

## 2014-12-05 DIAGNOSIS — F329 Major depressive disorder, single episode, unspecified: Secondary | ICD-10-CM

## 2014-12-05 DIAGNOSIS — G43101 Migraine with aura, not intractable, with status migrainosus: Secondary | ICD-10-CM | POA: Diagnosis not present

## 2014-12-05 DIAGNOSIS — E669 Obesity, unspecified: Secondary | ICD-10-CM

## 2014-12-05 DIAGNOSIS — F17210 Nicotine dependence, cigarettes, uncomplicated: Secondary | ICD-10-CM | POA: Diagnosis not present

## 2014-12-05 DIAGNOSIS — G43109 Migraine with aura, not intractable, without status migrainosus: Secondary | ICD-10-CM

## 2014-12-05 DIAGNOSIS — N920 Excessive and frequent menstruation with regular cycle: Secondary | ICD-10-CM

## 2014-12-05 MED ORDER — BUPROPION HCL ER (XL) 150 MG PO TB24: 150.0000 mg | ORAL_TABLET | Freq: Every day | ORAL | Status: DC

## 2014-12-05 NOTE — Patient Instructions (Addendum)
F/u in 3 months, call if you need me before  Call [facility], plan to quit feB 14, start welbutrin one daily for 3 days then one twice daily . Plan to quit 2 weeks after starting   I will find an alternative  For maxalt and send this in  More research on migraine prophylaxis and I will be in touch, send a my chart message today please  It is important that you exercise regularly at least 30 minutes 5 times a week. If you develop chest pain, have severe difficulty breathing, or feel very tired, stop exercising immediately and seek medical attention

## 2014-12-05 NOTE — Progress Notes (Signed)
Subjective:    Patient ID: Alejandra Barr, female    DOB: [birth year], 29 y.o.   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 Denies sinus pressure, nasal congestion, ear pain or sore throat. Denies chest congestion, productive cough or wheezing. Denies chest pains, palpitations and leg swelling Denies abdominal pain, nausea, vomiting,diarrhea or constipation.   Denies dysuria, frequency, hesitancy or incontinence. Denies joint pain, swelling and limitation in mobility. . Denies skin break down or rash.        Objective:   Physical Exam BP 108/74 mmHg  Pulse 82  Resp 16  Ht 5\' 2"  (1.575 m)  Wt 185 lb (83.915 kg)  BMI 33.83 kg/m2  SpO2 99%  Patient alert and oriented and in no cardiopulmonary distress.  HEENT: No facial asymmetry, EOMI,   oropharynx pink and moist.  Neck supple no JVD, no mass.  Chest: Clear to auscultation bilaterally.  CVS: S1, S2 no murmurs, no S3.Regular rate.  ABD: Soft non tender.   Ext: No edema  MS: Adequate ROM spine, shoulders, hips and knees.  Skin: Intact, no ulcerations or rash noted.  Psych: Good eye contact, normal affect. Memory intact not anxious or depressed appearing.  CNS: CN 2-12 intact, power,  normal throughout.no focal deficits  noted.        Assessment & Plan:  Migraine headache with aura Not well controlled. Needs alternative triptan to maxalt due to formulary coverage She is already on a peta blocker and topamax for prevention She recently saw neurosurgeon re neck pain , no surgery recommended, instead stretching exercises    Depression Improved , doing well on current medication, no changes at this time   Iron deficiency anemia On parenteral iron   Polymenorrhea Improved, no on provera from gyne on days 14 to 24   Obesity (BMI 30.0-34.9) Deteriorated. Patient re-educated about  the importance of commitment to a  minimum of 150 minutes of exercise per week. The importance of healthy food choices with portion control discussed. Encouraged to start a food diary, count calories and to consider  joining a support group. Sample diet sheets offered. Goals set by the patient for the next several months.      NICOTINE ADDICTION Current is 10 per day on 12/05/2013. Wants to quit on 12/16/2014 Patient counseled for approximately 5 minutes regarding the health risks of ongoing nicotine use, specifically all types of cancer, heart disease, stroke and respiratory failure. The options available for help with cessation ,the behavioral changes to assist the process, and the option to either gradully reduce usage  Or abruptly stop.is also discussed. Pt is also encouraged to set specific goals in number of cigarettes used daily, as well as to set a quit date. Start  wellbutrin

## 2014-12-06 MED ORDER — ALMOTRIPTAN MALATE 12.5 MG PO TABS: ORAL_TABLET | ORAL | Status: DC

## 2014-12-06 NOTE — Assessment & Plan Note (Signed)
Not well controlled. Needs alternative triptan to maxalt due to formulary coverage She is already on a peta blocker and topamax for prevention She recently saw neurosurgeon re neck pain , no surgery recommended, instead stretching exercises

## 2014-12-06 NOTE — Assessment & Plan Note (Signed)
Improved, no on provera from gyne on days 14 to 24

## 2014-12-06 NOTE — Assessment & Plan Note (Signed)
On parenteral iron

## 2014-12-06 NOTE — Assessment & Plan Note (Signed)
Current is 10 per day on 12/05/2013. Wants to quit on 12/16/2014 Patient counseled for approximately 5 minutes regarding the health risks of ongoing nicotine use, specifically all types of cancer, heart disease, stroke and respiratory failure. The options available for help with cessation ,the behavioral changes to assist the process, and the option to either gradully reduce usage  Or abruptly stop.is also discussed. Pt is also encouraged to set specific goals in number of cigarettes used daily, as well as to set a quit date. Start wellbutrin

## 2014-12-06 NOTE — Assessment & Plan Note (Signed)
Deteriorated. Patient re-educated about  the importance of commitment to a  minimum of 150 minutes of exercise per week. The importance of healthy food choices with portion control discussed. Encouraged to start a food diary, count calories and to consider  joining a support group. Sample diet sheets offered. Goals set by the patient for the next several months.    

## 2014-12-06 NOTE — Assessment & Plan Note (Signed)
Improved , doing well on current medication, no changes at this time

## 2014-12-28 ENCOUNTER — Other Ambulatory Visit: Payer: Self-pay | Admitting: Family Medicine

## 2015-01-18 ENCOUNTER — Encounter (HOSPITAL_COMMUNITY): Payer: BLUE CROSS/BLUE SHIELD | Attending: Hematology & Oncology | Admitting: Hematology & Oncology

## 2015-01-18 ENCOUNTER — Encounter (HOSPITAL_COMMUNITY): Payer: BLUE CROSS/BLUE SHIELD

## 2015-01-18 ENCOUNTER — Encounter (HOSPITAL_COMMUNITY): Payer: Self-pay | Admitting: Hematology & Oncology

## 2015-01-18 VITALS — BP 115/63 | HR 90 | Temp 98.8°F | Resp 16 | Wt 186.1 lb

## 2015-01-18 DIAGNOSIS — D509 Iron deficiency anemia, unspecified: Secondary | ICD-10-CM

## 2015-01-18 DIAGNOSIS — Z9884 Bariatric surgery status: Secondary | ICD-10-CM | POA: Diagnosis not present

## 2015-01-18 DIAGNOSIS — E538 Deficiency of other specified B group vitamins: Secondary | ICD-10-CM

## 2015-01-18 DIAGNOSIS — N920 Excessive and frequent menstruation with regular cycle: Secondary | ICD-10-CM

## 2015-01-18 LAB — CBC
HCT: 40.9 % (ref 36.0–46.0)
Hemoglobin: 13.7 g/dL (ref 12.0–15.0)
MCH: 31.2 pg (ref 26.0–34.0)
MCHC: 33.5 g/dL (ref 30.0–36.0)
MCV: 93.2 fL (ref 78.0–100.0)
Platelets: 270 10*3/uL (ref 150–400)
RBC: 4.39 MIL/uL (ref 3.87–5.11)
RDW: 13.5 % (ref 11.5–15.5)
WBC: 7.3 10*3/uL (ref 4.0–10.5)

## 2015-01-18 NOTE — Patient Instructions (Signed)
[place] at [facility] Discharge Instructions  RECOMMENDATIONS MADE BY THE CONSULTANT AND ANY TEST RESULTS WILL BE SENT TO YOUR REFERRING PHYSICIAN.  Exam and discussion by Dr. Whitney Muse. Will let you know if you need iron Call with any concerns  Follow-up with labs and office visit in 3 months.  Thank you for choosing [place] at [facility] to provide your oncology and hematology care.  To afford each patient quality time with our provider, please arrive at least 15 minutes before your scheduled appointment time.    You need to re-schedule your appointment should you arrive 10 or more minutes late.  We strive to give you quality time with our providers, and arriving late affects you and other patients whose appointments are after yours.  Also, if you no show three or more times for appointments you may be dismissed from the clinic at the providers discretion.     Again, thank you for choosing [facility].  Our hope is that these requests will decrease the amount of time that you wait before being seen by our physicians.       _____________________________________________________________  Should you have questions after your visit to [facility], please contact our office at [phone] between the hours of 8:30 a.m. and 4:30 p.m.  Voicemails left after 4:30 p.m. will not be returned until the following business day.  For prescription refill requests, have your pharmacy contact our office.

## 2015-01-19 LAB — FERRITIN: Ferritin: 40 ng/mL (ref 10–291)

## 2015-01-22 ENCOUNTER — Other Ambulatory Visit (HOSPITAL_COMMUNITY): Payer: Self-pay | Admitting: Hematology & Oncology

## 2015-01-22 ENCOUNTER — Other Ambulatory Visit (HOSPITAL_COMMUNITY): Payer: Self-pay

## 2015-01-22 LAB — METHYLMALONIC ACID, SERUM: Methylmalonic Acid, Quantitative: 129 nmol/L (ref 0–378)

## 2015-01-23 ENCOUNTER — Encounter (HOSPITAL_COMMUNITY): Payer: Self-pay

## 2015-01-23 ENCOUNTER — Encounter (HOSPITAL_BASED_OUTPATIENT_CLINIC_OR_DEPARTMENT_OTHER): Payer: BLUE CROSS/BLUE SHIELD

## 2015-01-23 DIAGNOSIS — D509 Iron deficiency anemia, unspecified: Secondary | ICD-10-CM

## 2015-01-23 DIAGNOSIS — E538 Deficiency of other specified B group vitamins: Secondary | ICD-10-CM

## 2015-01-23 MED ORDER — NA FERRIC GLUC CPLX IN SUCROSE 12.5 MG/ML IV SOLN
250.0000 mg | Freq: Once | INTRAVENOUS | Status: AC
  Administered 2015-01-23: 250 mg via INTRAVENOUS
  Filled 2015-01-23: qty 20

## 2015-01-23 MED ORDER — CYANOCOBALAMIN 1000 MCG/ML IJ SOLN
1000.0000 ug | Freq: Once | INTRAMUSCULAR | Status: AC
  Administered 2015-01-23: 1000 ug via INTRAMUSCULAR

## 2015-01-23 MED ORDER — CYANOCOBALAMIN 1000 MCG/ML IJ SOLN
INTRAMUSCULAR | Status: AC
  Filled 2015-01-23: qty 1

## 2015-01-23 MED ORDER — SODIUM CHLORIDE 0.9 % IV SOLN
Freq: Once | INTRAVENOUS | Status: AC
  Administered 2015-01-23: 14:00:00 via INTRAVENOUS

## 2015-01-23 NOTE — Patient Instructions (Signed)
[place] at [facility] Discharge Instructions  RECOMMENDATIONS MADE BY THE CONSULTANT AND ANY TEST RESULTS WILL BE SENT TO YOUR REFERRING PHYSICIAN.  Today you received iron infusion. Return as scheduled for lab work and office visit. Please call the clinic with any questions or concerns.  Thank you for choosing [place] at [facility] to provide your oncology and hematology care.  To afford each patient quality time with our provider, please arrive at least 15 minutes before your scheduled appointment time.    You need to re-schedule your appointment should you arrive 10 or more minutes late.  We strive to give you quality time with our providers, and arriving late affects you and other patients whose appointments are after yours.  Also, if you no show three or more times for appointments you may be dismissed from the clinic at the providers discretion.     Again, thank you for choosing [facility].  Our hope is that these requests will decrease the amount of time that you wait before being seen by our physicians.       _____________________________________________________________  Should you have questions after your visit to [facility], please contact our office at [phone] between the hours of 8:30 a.m. and 4:30 p.m.  Voicemails left after 4:30 p.m. will not be returned until the following business day.  For prescription refill requests, have your pharmacy contact our office.

## 2015-01-25 ENCOUNTER — Other Ambulatory Visit: Payer: Self-pay | Admitting: Family Medicine

## 2015-01-28 ENCOUNTER — Other Ambulatory Visit: Payer: Self-pay | Admitting: Family Medicine

## 2015-02-05 ENCOUNTER — Ambulatory Visit (HOSPITAL_COMMUNITY): Admission: RE | Admit: 2015-02-05 | Payer: BLUE CROSS/BLUE SHIELD | Source: Ambulatory Visit

## 2015-02-10 NOTE — Progress Notes (Signed)
[name], MD [address] [place] [address]  Gastric Bypass Iron deficiency anemia secondary to malabsorption Iron deficiency anemia secondary to menometrorrhagia B12 deficiency  CURRENT THERAPY: IV iron replacement  INTERVAL HISTORY: KAITY PITSTICK 29 y.o. female returns for routine follow-up of iron deficiency anemia. She has a history of gastric bypass surgery and irregular/heavy menses. She also has a history of B12 deficiency. She has been on Depo-Provera" her periods have become much lighter. She takes a prenatal vitamin twice daily. She would like to try to have a child.  She feels fairly well today. She has no major complaints or concerns.  Past Medical History  Diagnosis Date  . Asthma   . Chronic anxiety   . Chronic depression   . Obesity   . Iron deficiency   . 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 Atrial flutter     Status post RFA by Dr. Lovena Le in 2004    has Obesity (BMI 30.0-34.9); Iron deficiency anemia; BIPOLAR II DISORDER; ANXIETY, CHRONIC; NICOTINE ADDICTION; CLUSTER HEADACHE; Atrial flutter; ASTHMA, UNSPECIFIED, UNSPECIFIED STATUS; PALPITATIONS; Depression; Vitamin D deficiency; Insomnia; Rash and nonspecific skin eruption; GAD (generalized anxiety disorder); Low vitamin B12 level; Abnormal antibody titer--Strongyloides; Cutaneous strongyloidiasis; H/O gastric bypass; Abdominal  pain, other specified site; Nipple discharge in female; Change in stool; Guaiac positive stools; Preoperative cardiovascular examination; Migraine headache with aura; Anterior chest wall pain; Neck pain on right side; Thoracic spine pain; Chronic neck and back pain; Need for prophylactic vaccination and inoculation against influenza; Polymenorrhea; Need for Tdap vaccination; and S/P gastric bypass on her problem list.     No history exists.     is allergic to ciprofloxacin; morphine; morphine and related; and penicillins.  Ms. Mandelbaum had no medications  administered during this visit.  Past Surgical History  Procedure Laterality Date  . Cholecystectomy  2003  . Tonsillectomy  1990  . Bilateral eustachian tube placement on 4 different occasions    . Gastric bypass  2006  . Colonoscopy N/A 11/29/2013    Procedure: COLONOSCOPY;  Surgeon: Rogene Houston, MD;  [facility];  Service: Endoscopy;  Laterality: N/A;  120-rescheduled to 300 Ann notified pt    Review of Systems  Constitutional: Negative for fever, chills, weight loss and malaise/fatigue.  HENT: Negative for congestion, hearing loss, nosebleeds, sore throat and tinnitus.   Eyes: Negative for blurred vision, double vision, pain and discharge.  Respiratory: Negative for cough, hemoptysis, sputum production, shortness of breath and wheezing.   Cardiovascular: Negative for chest pain, palpitations, claudication, leg swelling and PND.  Gastrointestinal: Negative for heartburn, nausea, vomiting, abdominal pain, diarrhea, constipation, blood in stool and melena.  Genitourinary: Negative for dysuria, urgency, frequency and hematuria.  Musculoskeletal: Negative for myalgias, joint pain and falls.  Skin: Negative for itching and rash.  Neurological: Negative for dizziness, tingling, tremors, sensory change, speech change, focal weakness, seizures, loss of consciousness, weakness and headaches.  Endo/Heme/Allergies: Does not bruise/bleed easily.  Psychiatric/Behavioral: Negative for depression, suicidal ideas, memory loss and substance abuse. The patient is not nervous/anxious and does not have insomnia.     PHYSICAL EXAMINATION  ECOG PERFORMANCE STATUS: 0 - Asymptomatic  Filed Vitals:   01/18/15 1055  BP: 115/63  Pulse: 90  Temp: 98.8 F (37.1 C)  Resp: 16    Physical Exam  Constitutional: She is oriented to person, place, and time and well-developed, well-nourished, and in no distress.  HENT:  Head: Normocephalic and  atraumatic.  Nose: Nose normal.  Mouth/Throat:  Oropharynx is clear and moist.  Eyes: Conjunctivae are normal. Pupils are equal, round, and reactive to light. No scleral icterus.  Neck: Normal range of motion. No tracheal deviation present. No thyromegaly present.  Cardiovascular: Normal rate and regular rhythm.   No murmur heard. Pulmonary/Chest: Effort normal and breath sounds normal. No respiratory distress. She has no wheezes. She has no rales.  Abdominal: Soft. She exhibits no distension and no mass. There is no tenderness. There is no rebound and no guarding.  Musculoskeletal: Normal range of motion. She exhibits no edema.  Lymphadenopathy:    She has no cervical adenopathy.  Neurological: She is alert and oriented to person, place, and time. No cranial nerve deficit.  Skin: Skin is warm and dry. No erythema.  Psychiatric: Mood, memory, affect and judgment normal.    LABORATORY DATA: CBC    Component Value Date/Time   WBC 7.3 01/18/2015 1055   RBC 4.39 01/18/2015 1055   RBC 4.71 04/13/2008 1652   HGB 13.7 01/18/2015 1055   HCT 40.9 01/18/2015 1055   PLT 270 01/18/2015 1055   MCV 93.2 01/18/2015 1055   MCH 31.2 01/18/2015 1055   MCHC 33.5 01/18/2015 1055   RDW 13.5 01/18/2015 1055   LYMPHSABS 2.1 10/19/2014 1137   MONOABS 0.4 10/19/2014 1137   EOSABS 0.4 10/19/2014 1137   BASOSABS 0.1 10/19/2014 1137    Iron/TIBC/Ferritin/ %Sat    Component Value Date/Time   IRON 28* 08/21/2014 1452   TIBC 306 08/02/2013 0842   FERRITIN 40 01/18/2015 1055   IRONPCTSAT 29 08/02/2013 0842    ASSESSMENT/PLAN  Gastric bypass Iron malabsorption B12 deficiency Menorrhagia currently on Depo-Provera   We will call her when her iron levels become available. If she needs additional IV iron replacement we will get her in. 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 Her last B12 level in December of last year was low normal. She is not particularly compliant with monthly B12 and I have discussed this with her in detail.  If she were to become pregnant I would certainly recommend regular B12 replacement. We will see her back in 3 months with repeat laboratory studies. As stated we will notify her of her lab results when available.   All questions were answered. The patient knows to call the clinic with any problems, questions or concerns. We can certainly see the patient much sooner if necessary. This note was signed electronically  [place],Shannon KristenMD 02/10/2015

## 2015-02-15 ENCOUNTER — Encounter (HOSPITAL_COMMUNITY): Payer: Self-pay

## 2015-02-15 ENCOUNTER — Ambulatory Visit (HOSPITAL_COMMUNITY)
Admission: RE | Admit: 2015-02-15 | Discharge: 2015-02-15 | Disposition: A | Discharge status: Home / Self Care | Payer: BLUE CROSS/BLUE SHIELD | Source: Ambulatory Visit | Attending: Obstetrics & Gynecology | Admitting: Obstetrics & Gynecology

## 2015-02-15 DIAGNOSIS — Z9884 Bariatric surgery status: Secondary | ICD-10-CM | POA: Diagnosis not present

## 2015-02-15 DIAGNOSIS — Z720 Tobacco use: Secondary | ICD-10-CM | POA: Diagnosis not present

## 2015-02-15 DIAGNOSIS — F39 Unspecified mood [affective] disorder: Secondary | ICD-10-CM | POA: Insufficient documentation

## 2015-02-15 DIAGNOSIS — G43909 Migraine, unspecified, not intractable, without status migrainosus: Secondary | ICD-10-CM | POA: Insufficient documentation

## 2015-02-15 DIAGNOSIS — R002 Palpitations: Secondary | ICD-10-CM | POA: Insufficient documentation

## 2015-02-15 DIAGNOSIS — J45909 Unspecified asthma, uncomplicated: Secondary | ICD-10-CM | POA: Diagnosis not present

## 2015-02-15 DIAGNOSIS — Z3169 Encounter for other general counseling and advice on procreation: Secondary | ICD-10-CM | POA: Diagnosis present

## 2015-02-15 DIAGNOSIS — Z9889 Other specified postprocedural states: Secondary | ICD-10-CM

## 2015-02-15 NOTE — ED Notes (Signed)
Vital Signs BP 92/58 Pulse 79 Weight 194 lbs

## 2015-02-15 NOTE — Progress Notes (Signed)
29 year old, G0, for preconception counseling for history of gastric bypass  She is considering pregnancy with the recent reversal of a vasectomy of her partner who is 102 years old.  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 She notes that she has to "gaze" all day and cannot eat full meals since the procedure. She does have occasional problems with dumping syndrome and should have testing for gestational diabetes with fastin sugars (she notes current fasting values are normal as is her HgbA1c. She is currently being followed for replacement of low levels of iron and other vitamins/supplements by another provider. We discussed the potential issues with nausea/hyperemesis in pregnancy in someone with a gastric bypass. Ideally there would be a singleton pregnancy (we discussed options should there be higher-order gestation) given the suggested use of clomid. Needs counseling about risk of multifetal gestation with this medication and we discussed briefly today when multifetal reduction my be offered.  #2 s/p cardiac ablation with continued arrhythmias  She notes that she still feels symptoms in the form of palpitations, but her cardiologist feels there is no place to treat through repeat ablation procedure at this time. We discussed reevaluation prior to attempting pregnancy as the ideal time for an ablation is prior to pregnancy. She is taking Topromax XL 25mg  1-2 per day prn to control symptoms as she also has lower blood pressures and can't always tolerate the medication. We discussed that palpitations/arrhythmias often increase during pregnancy and become more symptomatics. We also discussed the normal increase in pulse rate and blood pressure changes in pregnancy, particularly in the second trimester when blood pressures are at their lowest  points.  #3 Risk of aneuploidy and genetic conditions  We her age related risk of aneuploidy and 1st and 2nd trimester screening (biochemical and sonographic) for the evaluation of aneuploidy. He is currently 29 years old and we discussed some of the issues related to advanced paternal age and have suggested a genetic consult.  #4 Tobacco use  She notes a typical use of 1 1/2 to 2 PPD of tobacco use and taking Wellbutrin and has been able to decrease to 1/2 PPD per her report. We discussed the issues related to this and the developing fetus, neonate and child and she expresses that she will stop tobacco use during pregnancy. #5 Preconception counseling  Discussed routine preconception issues.  #6 Migraines  She is currently on a variety of prn agents to control what she calls cluster type migraines. We discussed that tylenol (not ASA or Motrin type medications) was the mainstay of treatment in pregnancy or the use of IV/IM narcotics to try to break the migraine. She has tried a variety of alternative types of treating migraines that have been unsuccessful. #7 Asthma  Currently only using an inhaler. We discussed the importance of treatment during pregnancy #8 Mood disorders  Depression screen today was negative, but need to follow close during pregnancy and particularly peri/postpartum period #9 Medication use  We reviewed her current medications. We discussed the FDA classification of the meds and implications to the developing baby in the form of increased birth defects or changes in growth or effects reported after birth.   Recommendations  - Preconception optimization of iron, vitamins levels etc prior to pregnancy with checks monthly with supplementation through  pregnancy - Optimization of cardiac status and ablation if a foci to treat can be found - Nutrition consult prior to or early in pregnancy to optimizing diet given limited intact throughout the day - Smoking cessation prior to  pregnancy - Genetic consult for advanced paternal age and screening options during pregnancy (ordered today) - Currently on preconception prenatal vitamins for folic acid supplementation - Early pregnancy dating on achieving pregnancy - First trimester screening if desired - Fetal anatomic survey at 18-[redacted] weeks gestation - Serial assessment of fetal growth starting ~[redacted] weeks gestation monthly through the remainder of the pregnancy - Daily maternal assessment of fetal movement with immediate provider contact for decreased or cessation of fetal movement starting ~[redacted] weeks gestation - Screening for gestational diabetes with fasting glucose  - Antenatal testing reserved for IUGR or oligohydramnios or other obstetric indication - Delivery at 39-40 weeks if undelivered  Questions appear answered to the couples satisfaction. Precautions for the above given. Spent greater then 1/2 of a 65 minute visit face to face counseling.

## 2015-02-20 ENCOUNTER — Encounter (HOSPITAL_COMMUNITY): Payer: BLUE CROSS/BLUE SHIELD | Attending: Hematology & Oncology

## 2015-02-20 ENCOUNTER — Encounter (HOSPITAL_COMMUNITY): Payer: Self-pay

## 2015-02-20 VITALS — BP 141/61 | HR 103 | Temp 99.3°F | Resp 20

## 2015-02-20 DIAGNOSIS — E538 Deficiency of other specified B group vitamins: Secondary | ICD-10-CM | POA: Diagnosis not present

## 2015-02-20 DIAGNOSIS — Z9884 Bariatric surgery status: Secondary | ICD-10-CM | POA: Insufficient documentation

## 2015-02-20 DIAGNOSIS — D509 Iron deficiency anemia, unspecified: Secondary | ICD-10-CM | POA: Insufficient documentation

## 2015-02-20 MED ORDER — CYANOCOBALAMIN 1000 MCG/ML IJ SOLN
INTRAMUSCULAR | Status: AC
  Filled 2015-02-20: qty 1

## 2015-02-20 MED ORDER — CYANOCOBALAMIN 1000 MCG/ML IJ SOLN
1000.0000 ug | Freq: Once | INTRAMUSCULAR | Status: AC
  Administered 2015-02-20: 1000 ug via INTRAMUSCULAR

## 2015-02-20 NOTE — Progress Notes (Signed)
Alejandra Barr's reason for visit today is for an injection  as scheduled per MD orders.Alejandra Barr also received vitamin b12 injection in left arm per MD orders; see [facility] for administration details.  Alejandra Barr tolerated all procedures well and without incident; questions were answered and patient was discharged.

## 2015-02-20 NOTE — Patient Instructions (Signed)
[place] at [facility]  Discharge Instructions:  You had a vitamin b12 injection today.  Follow up as scheduled Please call the clinic if you have any questions or concerns _______________________________________________________________  Thank you for choosing [place] at [facility] to provide your oncology and hematology care.  To afford each patient quality time with our providers, please arrive at least 15 minutes before your scheduled appointment.  You need to re-schedule your appointment if you arrive 10 or more minutes late.  We strive to give you quality time with our providers, and arriving late affects you and other patients whose appointments are after yours.  Also, if you no show three or more times for appointments you may be dismissed from the clinic.  Again, thank you for choosing [place] at [facility] hope is that these requests will allow you access to exceptional care and in a timely manner. _______________________________________________________________  If you have questions after your visit, please contact our office at [phone] between the hours of 8:30 a.m. and 5:00 p.m. Voicemails left after 4:30 p.m. will not be returned until the following business day. _______________________________________________________________  For prescription refill requests, have your pharmacy contact our office. _______________________________________________________________  Recommendations made by the consultant and any test results will be sent to your referring physician. _______________________________________________________________

## 2015-02-21 ENCOUNTER — Ambulatory Visit (HOSPITAL_COMMUNITY): Payer: BLUE CROSS/BLUE SHIELD

## 2015-02-25 ENCOUNTER — Other Ambulatory Visit (HOSPITAL_COMMUNITY): Payer: Self-pay | Admitting: Obstetrics & Gynecology

## 2015-02-25 ENCOUNTER — Encounter (HOSPITAL_COMMUNITY): Payer: Self-pay | Admitting: Obstetrics & Gynecology

## 2015-03-01 ENCOUNTER — Ambulatory Visit (HOSPITAL_COMMUNITY): Payer: BLUE CROSS/BLUE SHIELD

## 2015-03-05 ENCOUNTER — Encounter: Payer: Self-pay | Admitting: Family Medicine

## 2015-03-05 ENCOUNTER — Ambulatory Visit (INDEPENDENT_AMBULATORY_CARE_PROVIDER_SITE_OTHER): Payer: BLUE CROSS/BLUE SHIELD | Admitting: Family Medicine

## 2015-03-05 VITALS — BP 104/70 | HR 75 | Resp 16 | Ht 62.0 in | Wt 190.0 lb

## 2015-03-05 DIAGNOSIS — E669 Obesity, unspecified: Secondary | ICD-10-CM

## 2015-03-05 DIAGNOSIS — J4520 Mild intermittent asthma, uncomplicated: Secondary | ICD-10-CM

## 2015-03-05 DIAGNOSIS — R7989 Other specified abnormal findings of blood chemistry: Secondary | ICD-10-CM

## 2015-03-05 DIAGNOSIS — E66811 Obesity, class 1: Secondary | ICD-10-CM

## 2015-03-05 DIAGNOSIS — F17200 Nicotine dependence, unspecified, uncomplicated: Secondary | ICD-10-CM

## 2015-03-05 DIAGNOSIS — R002 Palpitations: Secondary | ICD-10-CM

## 2015-03-05 DIAGNOSIS — D509 Iron deficiency anemia, unspecified: Secondary | ICD-10-CM | POA: Diagnosis not present

## 2015-03-05 DIAGNOSIS — Z720 Tobacco use: Secondary | ICD-10-CM

## 2015-03-05 DIAGNOSIS — G43109 Migraine with aura, not intractable, without status migrainosus: Secondary | ICD-10-CM

## 2015-03-05 DIAGNOSIS — E538 Deficiency of other specified B group vitamins: Secondary | ICD-10-CM

## 2015-03-05 NOTE — Patient Instructions (Signed)
F/u in 6 month, call if you need me before  Please No more smoking after  Today  It is important that you exercise regularly at least 30 minutes 5 times a week. If you develop chest pain, have severe difficulty breathing, or feel very tired, stop exercising immediately and seek medical attention   All the best, keep in touch   Thanks for choosing [facility], we consider it a privelige to serve you.

## 2015-03-07 ENCOUNTER — Ambulatory Visit (HOSPITAL_COMMUNITY)
Admission: RE | Admit: 2015-03-07 | Discharge: 2015-03-07 | Disposition: A | Discharge status: Home / Self Care | Payer: BLUE CROSS/BLUE SHIELD | Source: Ambulatory Visit | Attending: Obstetrics & Gynecology | Admitting: Obstetrics & Gynecology

## 2015-03-07 DIAGNOSIS — Z3169 Encounter for other general counseling and advice on procreation: Secondary | ICD-10-CM

## 2015-03-07 DIAGNOSIS — IMO0002 Reserved for concepts with insufficient information to code with codable children: Secondary | ICD-10-CM

## 2015-03-08 DIAGNOSIS — Z3169 Encounter for other general counseling and advice on procreation: Secondary | ICD-10-CM | POA: Insufficient documentation

## 2015-03-08 DIAGNOSIS — IMO0002 Reserved for concepts with insufficient information to code with codable children: Secondary | ICD-10-CM | POA: Insufficient documentation

## 2015-03-08 NOTE — Addendum Note (Signed)
Encounter addended by: Cottie Banda Lanny Donoso on: 03/08/2015  5:12 PM<BR>     Documentation filed: Charges VN, Problem List

## 2015-03-08 NOTE — Progress Notes (Signed)
Genetic Counseling  Preconception Note  Appointment Date:  03/07/2015 Referred By: Morris, Megan, DO Date of Birth:  08/11/1986 Partner:  David Gates  I met with Satya H Karg and her husband, Mr. David Dingley, for preconception genetic counseling because of a paternal age of 30 years old.   This couple was counseled that advanced paternal age (APA) is defined as paternal age greater than or equal to age 45.  Recent large-scale sequencing studies have shown that approximately 80% of de novo point mutations are of paternal origin.  Many studies have demonstrated a strong correlation between increased paternal age and de novo point mutations.  Although no specific data is available regarding fetal risks for fathers 45+ years old at conception, it is apparent that the overall risk for single gene conditions is increased.  We reviewed genes, chromosomes, and examples of patterns of inheritance.   To estimate the relative increase in risk of a genetic disorder with APA, the heritability of the disease must be considered.  Assuming an approximate 2x increase in risk for conditions that are exclusively paternal in origin, the risk for each individual condition is still relatively low.  It is estimated that the overall chance for a de novo mutation is ~0.5%.  We also discussed the wide range of conditions which can be caused by new dominant gene mutations (achondroplasia, craniosynostoses, neurofibromatosis, Marfan syndrome etc.).  They were counseled that genetic testing for each individual single gene condition is not warranted or available unless ultrasound or family history concerns lend suspicion to a specific condition. Given that the association is with de novo mutations, the couple understands that there is not preconception assessment for these conditions available.    In addition, they were counseled that some literature suggests that APA is also associated with an increase in risk for fetal  aneuploidy. We reviewed the concept of nondisjunction.  Literature specifically suggests an increased risk for Down syndrome and XXY (Klinefelter syndrome). We reviewed that the patient's current age would not be considered increased risk for aneuploidy. Given the current age of Mr. Donati, the risk for Down syndrome would be estimated to be 1 in 150, and the risk for XXY would be approximately 1 in 300. However, other literature does not support this association with increased paternal age and fetal aneuploidy.  We discussed that screening and testing for fetal aneuploidy would be available in a future pregnancy including First screen, Quad screen, noninvasive prenatal screening (NIPS)/cell free DNA testing, and targeted ultrasound. We reviewed that these screens have varying sensitivity and false positive rates and are not considered diagnostic for chromosome conditions. In addition, we discussed diagnostic testing including CVS and amniocentesis.  The risks, benefits, and limitations of all of these options were reviewed in detail.  Regarding ultrasound in pregnancy, we discussed the recommendation for a detailed ultrasound at 18+ weeks gestation and a follow up ultrasound at ~28 weeks to monitor fetal growth in cases of advanced paternal age.   Mrs. Parcher was provided with written information regarding cystic fibrosis (CF) including the carrier frequency and incidence in the Caucasian population, the availability of carrier testing and prenatal diagnosis if indicated.  In addition, we discussed that CF is routinely screened for as part of the [place] newborn screening panel.  She declined testing today. Mrs. Christensen inquired about additional genetic testing available preconception. We discussed expanded carrier screening, which is available through several laboratories. This carrier screen provides carrier screening for common disease-causing mutations of many autosomal recessive   conditions. The exact number of  conditions on the screen can range from 24 to over 200, depending upon the laboratory and particular screening panel. In addition, some laboratories offer carrier screening for certain X-linked conditions for females. We reviewed that the prevalence of each condition varies (and often varies with ethnicity). Thus the couples' background risk to be a carrier for each of these various conditions would range, and in some cases be very low or unknown. Similarly, the detection rate varies with each condition and also varies in some cases with ethnicity, ranging from greater than 99% (in the case of Hemoglobinopathies) to 11% to unknown. We reviewed that a negative carrier screen would thus reduce but not eliminate the chance to be a carrier for these conditions.  For some conditions included on this expanded carrier screening panel, the pre-test carrier frequency and/or the detection rate is unknown. Thus, for some conditions included on the expanded carrier screening panel, the exact reduction of risk with a negative carrier screening result may not be able to be quantified. We reviewed that in the event that one partner is found to be a carrier for one or more conditions, carrier screening would be available to the partner for those conditions. The couple was provided with written information regarding expanded carrier screening option including a list of the conditions included on the screening panel. We discussed the risks, benefits, and limitations of carrier screening with the couple. Mrs. Motter planned to investigate her insurance coverage for expanded carrier screening and contact our office, should she desire this screening.   Both family histories were reviewed and found to be contributory for history of insulin dependent diabetes for Mr. Boettger. Diabetes mellitus type I is thought to be due to multifactorial inheritance, with onset due to a combination genetic and environmental factors. The chance for type  I diabetes in offspring of an isolated case is approximately 1 in 71 to 1 in 30 (4-5%). We discussed that prenatal screening or testing would not be available for this condition, but it would be helpful for pediatricians to be aware of this history.   The patient reported that her mother had premenopausal breast cancer, and three maternal great-aunts also had breast cancer. The patient reported that one daughter of one of these aunts had ovarian cancer and had a BRCA mutation. The patient reported that her mother had testing and does not have a BRCA mutation. Though most cancers are thought to be sporadic or due to environmental factors, some families appear to have a strong predisposition to cancers.  When considering a family history of cancer, we look for common types of cancer in multiple family members occurring at younger than typical ages. we discussed the option of meeting with a cancer genetic counselor to discuss any possible screening or testing options available. If they are concerned about the family history of cancer and would like to learn more about the family's chance for an inherited cancer syndrome, her doctor may refer her or her relatives to [facility] [phone]). We reviewed that it would be helpful for Ms. Auletta's physician to be aware of this history so that she is screened and followed appropriately. Without further information regarding the provided family history, an accurate genetic risk cannot be calculated. Further genetic counseling is warranted if more information is obtained.   Mrs. [name] denied exposure to environmental toxins or chemical agents. She denied the use of alcohol or street drugs. She reported smoking a  half pack of cigarettes per day and reported that she is working on quitting. It is important to remember that each pregnancy in the general population has an approximate 3-5% chance for a birth defect that might not be detected  prenatally. For all women of childbearing age, 0.4 mg of folic acid, a B-complex vitamin, is recommended.  I counseled this couple regarding the above risks and available options.  The approximate face-to-face time with the genetic counselor was 50 minutes.  Chipper Oman, MS,  Certified Genetic Counselor 03/08/2015

## 2015-03-20 ENCOUNTER — Encounter (HOSPITAL_COMMUNITY): Payer: BLUE CROSS/BLUE SHIELD | Attending: Hematology & Oncology

## 2015-03-20 VITALS — BP 120/72 | HR 86 | Temp 99.0°F | Resp 18

## 2015-03-20 DIAGNOSIS — E538 Deficiency of other specified B group vitamins: Secondary | ICD-10-CM

## 2015-03-20 DIAGNOSIS — D509 Iron deficiency anemia, unspecified: Secondary | ICD-10-CM | POA: Insufficient documentation

## 2015-03-20 DIAGNOSIS — Z9884 Bariatric surgery status: Secondary | ICD-10-CM | POA: Insufficient documentation

## 2015-03-20 MED ORDER — CYANOCOBALAMIN 1000 MCG/ML IJ SOLN
1000.0000 ug | Freq: Once | INTRAMUSCULAR | Status: AC
  Administered 2015-03-20: 1000 ug via INTRAMUSCULAR

## 2015-03-20 MED ORDER — CYANOCOBALAMIN 1000 MCG/ML IJ SOLN
INTRAMUSCULAR | Status: AC
  Filled 2015-03-20: qty 1

## 2015-03-20 NOTE — Patient Instructions (Signed)
[place] at [facility] Discharge Instructions  RECOMMENDATIONS MADE BY THE CONSULTANT AND ANY TEST RESULTS WILL BE SENT TO YOUR REFERRING PHYSICIAN.  Vitamin B12 injection 1000 mcg given as ordered. Return as scheduled.  Thank you for choosing [place] at [facility] to provide your oncology and hematology care.  To afford each patient quality time with our provider, please arrive at least 15 minutes before your scheduled appointment time.    You need to re-schedule your appointment should you arrive 10 or more minutes late.  We strive to give you quality time with our providers, and arriving late affects you and other patients whose appointments are after yours.  Also, if you no show three or more times for appointments you may be dismissed from the clinic at the providers discretion.     Again, thank you for choosing [facility].  Our hope is that these requests will decrease the amount of time that you wait before being seen by our physicians.       _____________________________________________________________  Should you have questions after your visit to [facility], please contact our office at [phone] between the hours of 8:30 a.m. and 4:30 p.m.  Voicemails left after 4:30 p.m. will not be returned until the following business day.  For prescription refill requests, have your pharmacy contact our office.

## 2015-03-20 NOTE — Progress Notes (Signed)
Alejandra Barr presents today for injection per MD orders. B12 1000 mcg administered IM in right Upper Arm. Administration without incident. Patient tolerated well.

## 2015-03-24 ENCOUNTER — Encounter: Payer: Self-pay | Admitting: Family Medicine

## 2015-04-11 NOTE — Assessment & Plan Note (Signed)
Deteriorated. Patient re-educated about  the importance of commitment to a  minimum of 150 minutes of exercise per week.  The importance of healthy food choices with portion control discussed. Encouraged to start a food diary, count calories and to consider  joining a support group. Sample diet sheets offered. Goals set by the patient for the next several months.   Weight /BMI 03/05/2015 01/18/2015 12/05/2014  WEIGHT 190 lb 186 lb 1.6 oz 185 lb  HEIGHT 5\' 2"  - 5\' 2"   BMI 34.74 kg/m2 34.03 kg/m2 33.83 kg/m2    Current exercise per week 60 minutes.

## 2015-04-11 NOTE — Assessment & Plan Note (Signed)
Being treated through hematology

## 2015-04-11 NOTE — Assessment & Plan Note (Signed)
Tylenol as needed for headaches per pt decision, denies significant frequency or debility of headaches currently

## 2015-04-11 NOTE — Assessment & Plan Note (Addendum)

## 2015-04-11 NOTE — Assessment & Plan Note (Signed)
stable, will use as needed, albuterol only

## 2015-04-11 NOTE — Assessment & Plan Note (Signed)
Controlled, no change in medication  

## 2015-04-11 NOTE — Progress Notes (Signed)
   Subjective:    Patient ID: Alejandra Barr, female    DOB: 26-Feb-1986, 29 y.o.   MRN: [id number]  HPI Pt in for review of her chronic problems. Her news is that she is actively trying to conceive , her , who is 30 yo,recently had reversal of his vasectomy and she is excited about this. She wants to discontinue all medications, and has already essentially tapered off of her chronic medications for headache and mental health needs.Will continue iron supplement , metoprolol and clomid, also albuterol if needed She is also making the commitment of smoking cessation, and healthy lifestyle, much more for the baby she hopes to conceive than herself States she has continued to remain alcohol free and wants to join an [place] group once more , needs a sponsor , which I offer to help with   Review of Systems See HPI Denies recent fever or chills. Denies sinus pressure, nasal congestion, ear pain or sore throat. Denies chest congestion, productive cough or wheezing. Denies chest pains, palpitations and leg swelling Denies abdominal pain, nausea, vomiting,diarrhea or constipation.   Denies dysuria, frequency, hesitancy or incontinence. Denies joint pain, swelling and limitation in mobility. Denies headaches, seizures, numbness, or tingling. Denies depression, anxiety or insomnia. Denies skin break down or rash.         Objective:   Physical Exam Patient alert and oriented and in no cardiopulmonary distress.  HEENT: No facial asymmetry, EOMI,   oropharynx pink and moist.  Neck supple no JVD, no mass.  Chest: Clear to auscultation bilaterally.  CVS: S1, S2 no murmurs, no S3.Regular rate.  ABD: Soft non tender.   Ext: No edema  MS: Adequate ROM spine, shoulders, hips and knees.  Skin: Intact, no ulcerations or rash noted.  Psych: Good eye contact, normal affect. Memory intact not anxious or depressed appearing.  CNS: CN 2-12 intact, power,  normal throughout.no focal deficits  noted.        Assessment & Plan:  Obesity (BMI 30.0-34.9) Deteriorated. Patient re-educated about  the importance of commitment to a  minimum of 150 minutes of exercise per week.  The importance of healthy food choices with portion control discussed. Encouraged to start a food diary, count calories and to consider  joining a support group. Sample diet sheets offered. Goals set by the patient for the next several months.   Weight /BMI 03/05/2015 01/18/2015 12/05/2014  WEIGHT 190 lb 186 lb 1.6 oz 185 lb  HEIGHT 5\' 2"  - 5\' 2"   BMI 34.74 kg/m2 34.03 kg/m2 33.83 kg/m2    Current exercise per week 60 minutes.   NICOTINE ADDICTION Patient counseled for approximately 5 minutes regarding the health risks of ongoing nicotine use, specifically all types of cancer, heart disease, stroke and respiratory failure. The options available for help with cessation ,the behavioral changes to assist the process, and the option to either gradully reduce usage  Or abruptly stop.is also discussed. Pt is also encouraged to set specific goals in number of cigarettes used daily, as well as to set a quit date.  Number of cigarettes/cigars currently smoking daily: 10  PALPITATIONS Controlled, no change in medication   Iron deficiency anemia Treated by hematology with parenteral iron  Asthma, mild intermittent, well-controlled stable, will use as needed, albuterol only  Migraine headache with aura Tylenol as needed for headaches per pt decision, denies significant frequency or debility of headaches currently  Low vitamin B12 level Being treated through hematology

## 2015-04-11 NOTE — Assessment & Plan Note (Signed)
Treated by hematology with parenteral iron

## 2015-04-14 ENCOUNTER — Other Ambulatory Visit: Payer: Self-pay | Admitting: Family Medicine

## 2015-04-19 ENCOUNTER — Encounter (HOSPITAL_COMMUNITY): Payer: BLUE CROSS/BLUE SHIELD | Attending: Hematology & Oncology | Admitting: Hematology & Oncology

## 2015-04-19 ENCOUNTER — Encounter (HOSPITAL_BASED_OUTPATIENT_CLINIC_OR_DEPARTMENT_OTHER): Payer: BLUE CROSS/BLUE SHIELD

## 2015-04-19 ENCOUNTER — Encounter (HOSPITAL_COMMUNITY): Payer: BLUE CROSS/BLUE SHIELD

## 2015-04-19 VITALS — BP 112/70 | HR 85 | Temp 98.6°F | Resp 14 | Wt 199.1 lb

## 2015-04-19 DIAGNOSIS — K909 Intestinal malabsorption, unspecified: Secondary | ICD-10-CM | POA: Diagnosis not present

## 2015-04-19 DIAGNOSIS — D509 Iron deficiency anemia, unspecified: Secondary | ICD-10-CM

## 2015-04-19 DIAGNOSIS — E538 Deficiency of other specified B group vitamins: Secondary | ICD-10-CM

## 2015-04-19 DIAGNOSIS — Z9884 Bariatric surgery status: Secondary | ICD-10-CM

## 2015-04-19 DIAGNOSIS — N920 Excessive and frequent menstruation with regular cycle: Secondary | ICD-10-CM | POA: Diagnosis not present

## 2015-04-19 LAB — COMPREHENSIVE METABOLIC PANEL
ALT: 10 U/L — ABNORMAL LOW (ref 14–54)
AST: 16 U/L (ref 15–41)
Albumin: 3.9 g/dL (ref 3.5–5.0)
Alkaline Phosphatase: 90 U/L (ref 38–126)
Anion gap: 9 (ref 5–15)
BUN: 18 mg/dL (ref 6–20)
CO2: 25 mmol/L (ref 22–32)
Calcium: 9.2 mg/dL (ref 8.9–10.3)
Chloride: 106 mmol/L (ref 101–111)
Creatinine, Ser: 0.7 mg/dL (ref 0.44–1.00)
GFR calc Af Amer: >60 mL/min (ref >60)
GFR calc non Af Amer: >60 mL/min (ref >60)
Glucose, Bld: 95 mg/dL (ref 65–99)
Potassium: 4.4 mmol/L (ref 3.5–5.1)
Sodium: 140 mmol/L (ref 135–145)
Total Bilirubin: 0.4 mg/dL (ref 0.3–1.2)
Total Protein: 7.2 g/dL (ref 6.5–8.1)

## 2015-04-19 LAB — CBC WITH DIFFERENTIAL/PLATELET
Basophils Absolute: 0.1 10*3/uL (ref 0.0–0.1)
Basophils Relative: 1 % (ref 0–1)
Eosinophils Absolute: 0.5 10*3/uL (ref 0.0–0.7)
Eosinophils Relative: 5 % (ref 0–5)
HCT: 42.3 % (ref 36.0–46.0)
Hemoglobin: 14.2 g/dL (ref 12.0–15.0)
Lymphocytes Relative: 21 % (ref 12–46)
Lymphs Abs: 2.3 10*3/uL (ref 0.7–4.0)
MCH: 32.1 pg (ref 26.0–34.0)
MCHC: 33.6 g/dL (ref 30.0–36.0)
MCV: 95.5 fL (ref 78.0–100.0)
Monocytes Absolute: 0.6 10*3/uL (ref 0.1–1.0)
Monocytes Relative: 5 % (ref 3–12)
Neutro Abs: 7.4 10*3/uL (ref 1.7–7.7)
Neutrophils Relative %: 68 % (ref 43–77)
Platelets: 281 10*3/uL (ref 150–400)
RBC: 4.43 MIL/uL (ref 3.87–5.11)
RDW: 13.3 % (ref 11.5–15.5)
WBC: 10.8 10*3/uL — ABNORMAL HIGH (ref 4.0–10.5)

## 2015-04-19 LAB — IRON AND TIBC
Iron: 92 ug/dL (ref 28–170)
Saturation Ratios: 24 % (ref 10.4–31.8)
TIBC: 385 ug/dL (ref 250–450)
UIBC: 293 ug/dL

## 2015-04-19 LAB — FERRITIN: Ferritin: 37 ng/mL (ref 11–307)

## 2015-04-19 MED ORDER — CYANOCOBALAMIN 1000 MCG/ML IJ SOLN: 1000.0000 ug | Freq: Once | INTRAMUSCULAR | Status: DC

## 2015-04-19 NOTE — Patient Instructions (Signed)
..  [place] at [facility] Discharge Instructions  RECOMMENDATIONS MADE BY THE CONSULTANT AND ANY TEST RESULTS WILL BE SENT TO YOUR REFERRING PHYSICIAN.  Your B12 prescription has been sent to [facility] in [place].  You will return every two months for labs so we can monitor your ferritin and return for follow up appointment in 6 months.    Thank you for choosing [place] at [facility] to provide your oncology and hematology care.  To afford each patient quality time with our provider, please arrive at least 15 minutes before your scheduled appointment time.    You need to re-schedule your appointment should you arrive 10 or more minutes late.  We strive to give you quality time with our providers, and arriving late affects you and other patients whose appointments are after yours.  Also, if you no show three or more times for appointments you may be dismissed from the clinic at the providers discretion.     Again, thank you for choosing [facility].  Our hope is that these requests will decrease the amount of time that you wait before being seen by our physicians.       _____________________________________________________________  Should you have questions after your visit to [facility], please contact our office at [phone] between the hours of 8:30 a.m. and 4:30 p.m.  Voicemails left after 4:30 p.m. will not be returned until the following business day.  For prescription refill requests, have your pharmacy contact our office.

## 2015-04-19 NOTE — Progress Notes (Signed)
Alejandra Nakayama, MD [address] [place] [address]  Gastric Bypass Iron deficiency anemia secondary to malabsorption Iron deficiency anemia secondary to menometrorrhagia B12 deficiency  CURRENT THERAPY: IV iron replacement  INTERVAL HISTORY: ASAIAH Alejandra Barr 29 y.o. female returns for routine follow-up of iron deficiency anemia. She has a history of gastric bypass surgery and irregular/heavy menses. She also has a history of B12 deficiency.  She takes a prenatal vitamin twice daily. She would like to try to have a child.   She is here alone today. She is trying to become pregnant and she's currently taking Clomid. She was curious about how she'd have to change treatment once she becomes pregnant. Has had fatigue for last week and a half. Her husband is a nurse so she'd like to have her B12 shots done at home by him.   Past Medical History  Diagnosis Date  . Asthma   . Chronic anxiety   . Chronic depression   . Obesity   . Iron deficiency   . 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 Atrial flutter     Status post RFA by Dr. Lovena Le in 2004    has Obesity (BMI 30.0-34.9); Iron deficiency anemia; BIPOLAR II DISORDER; ANXIETY, CHRONIC; NICOTINE ADDICTION; CLUSTER HEADACHE; Atrial flutter; Asthma, mild intermittent, well-controlled; PALPITATIONS; Depression; Vitamin D deficiency; Insomnia; GAD (generalized anxiety disorder); Low vitamin B12 level; Abnormal antibody titer--Strongyloides; Abdominal pain, other specified site; Migraine headache with aura; Encounter for genetic counseling and testing; and Encounter for preconception consultation on her problem list.     No history exists.     is allergic to ciprofloxacin; morphine; morphine and related; and penicillins.  Ms. Saline had no medications administered during this visit.  Past Surgical History  Procedure Laterality Date  . Cholecystectomy  2003  . Tonsillectomy  1990  . Bilateral eustachian tube  placement on 4 different occasions    . Gastric bypass  2006  . Colonoscopy N/A 11/29/2013    Procedure: COLONOSCOPY;  Surgeon: Rogene Houston, MD;  [facility];  Service: Endoscopy;  Laterality: N/A;  120-rescheduled to 300 Ann notified pt    Review of Systems  Constitutional: Positive for malaise/fatigue. Negative for fever, chills, and weight loss.       Currently trying to get pregnant. HENT: Negative for congestion, hearing loss, nosebleeds, sore throat and tinnitus.   Eyes: Negative for blurred vision, double vision, pain and discharge.  Respiratory: Negative for cough, hemoptysis, sputum production, shortness of breath and wheezing.   Cardiovascular: Negative for chest pain, palpitations, claudication, leg swelling and PND.  Gastrointestinal: Negative for heartburn, nausea, vomiting, abdominal pain, diarrhea, constipation, blood in stool and melena.  Genitourinary: Negative for dysuria, urgency, frequency and hematuria.  Musculoskeletal: Negative for myalgias, joint pain and falls.  Skin: Negative for itching and rash.  Neurological: Negative for dizziness, tingling, tremors, sensory change, speech change, focal weakness, seizures, loss of consciousness, weakness and headaches.  Endo/Heme/Allergies: Does not bruise/bleed easily.  Psychiatric/Behavioral: Negative for depression, suicidal ideas, memory loss and substance abuse. The patient is not nervous/anxious and does not have insomnia.     PHYSICAL EXAMINATION  ECOG PERFORMANCE STATUS: 0 - Asymptomatic  Filed Vitals:   04/19/15 1128  BP: 112/70  Pulse: 85  Temp: 98.6 F (37 C)  Resp: 14    Physical Exam  Constitutional: She is oriented to person, place, and time and well-developed, well-nourished, and in no distress.  HENT:  Head:  Normocephalic and atraumatic.  Nose: Nose normal.  Mouth/Throat: Oropharynx is clear and moist.  Cheilitis inside both sides of her mouth. Eyes: Conjunctivae are normal. Pupils  are equal, round, and reactive to light. No scleral icterus.  Neck: Normal range of motion. No tracheal deviation present. No thyromegaly present.  Cardiovascular: Normal rate and regular rhythm.   No murmur heard. Pulmonary/Chest: Effort normal and breath sounds normal. No respiratory distress. She has no wheezes. She has no rales.  Abdominal: Soft. She exhibits no distension and no mass. There is no tenderness. There is no rebound and no guarding.  Musculoskeletal: Normal range of motion. She exhibits no edema.  Lymphadenopathy:    She has no cervical adenopathy.  Neurological: She is alert and oriented to person, place, and time. No cranial nerve deficit.  Skin: Skin is warm and dry. No erythema.  Psychiatric: Mood, memory, affect and judgment normal.    LABORATORY DATA: CBC    Component Value Date/Time   WBC 10.8* 04/19/2015 1100   RBC 4.43 04/19/2015 1100   RBC 4.71 04/13/2008 1652   HGB 14.2 04/19/2015 1100   HCT 42.3 04/19/2015 1100   PLT 281 04/19/2015 1100   MCV 95.5 04/19/2015 1100   MCH 32.1 04/19/2015 1100   MCHC 33.6 04/19/2015 1100   RDW 13.3 04/19/2015 1100   LYMPHSABS 2.3 04/19/2015 1100   MONOABS 0.6 04/19/2015 1100   EOSABS 0.5 04/19/2015 1100   BASOSABS 0.1 04/19/2015 1100    Iron/TIBC/Ferritin/ %Sat    Component Value Date/Time   IRON 92 04/19/2015 1130   TIBC 385 04/19/2015 1130   FERRITIN 37 04/19/2015 1130   IRONPCTSAT 24 04/19/2015 1130    ASSESSMENT/PLAN  Gastric bypass Iron malabsorption B12 deficiency Menorrhagia currently on Depo-Provera  I advised the patient that we will replace her iron if needed pending her ferritin level today. In addition I discussed with her that we typically avoid IV iron during the first trimester pregnancy. But after that she can proceed with routine iron replacement. She can continue on her B12 safely. We discussed other options for maintaining her iron levels in early pregnancy. She does have malabsorption from  her prior gastric bypass, we did discuss trying a trial of liquid iron dissolved in orange juice, or Niferex forte.  Prescribed B12 shots to be taken at home, administered by her husband who is a Marine scientist.  Will schedule her to have blood work checked every 2 months. Scheduled her for Ferritin every 2 months. Follow up every 6 months. Advised to let us know if she becomes pregnant sometime between her visits.  All questions were answered. The patient knows to call the clinic with any problems, questions or concerns. We can certainly see the patient much sooner if necessary.  This document serves as a record of services personally performed by Ancil Linsey, MD. It was created on her behalf by Arlyce Harman, a trained medical scribe. The creation of this record is based on the scribe's personal observations and the provider's statements to them. This document has been checked and approved by the attending provider.  I have reviewed the above documentation for accuracy and completeness, and I agree with the above.  This note was signed electronically  Vester Balthazor KristenMD 05/06/2015

## 2015-04-19 NOTE — Progress Notes (Signed)
Lab draw

## 2015-04-19 NOTE — Progress Notes (Signed)
Please see doctors encounter for more information 

## 2015-04-22 ENCOUNTER — Encounter: Payer: Self-pay | Admitting: Family Medicine

## 2015-04-23 ENCOUNTER — Other Ambulatory Visit (HOSPITAL_COMMUNITY): Payer: Self-pay | Admitting: Oncology

## 2015-04-23 DIAGNOSIS — D509 Iron deficiency anemia, unspecified: Secondary | ICD-10-CM

## 2015-05-06 ENCOUNTER — Encounter (HOSPITAL_COMMUNITY): Payer: Self-pay | Admitting: Hematology & Oncology

## 2015-05-08 ENCOUNTER — Encounter (HOSPITAL_COMMUNITY): Payer: BLUE CROSS/BLUE SHIELD | Attending: Hematology & Oncology

## 2015-05-08 ENCOUNTER — Encounter (HOSPITAL_COMMUNITY): Payer: Self-pay

## 2015-05-08 VITALS — BP 93/56 | HR 67 | Temp 98.7°F | Resp 16

## 2015-05-08 DIAGNOSIS — D509 Iron deficiency anemia, unspecified: Secondary | ICD-10-CM | POA: Insufficient documentation

## 2015-05-08 DIAGNOSIS — Z9884 Bariatric surgery status: Secondary | ICD-10-CM | POA: Insufficient documentation

## 2015-05-08 MED ORDER — SODIUM CHLORIDE 0.9 % IJ SOLN: 10.0000 mL | Freq: Once | INTRAMUSCULAR | Status: DC

## 2015-05-08 MED ORDER — SODIUM CHLORIDE 0.9 % IV SOLN
125.0000 mg | Freq: Once | INTRAVENOUS | Status: AC
  Administered 2015-05-08: 125 mg via INTRAVENOUS
  Filled 2015-05-08: qty 10

## 2015-05-08 MED ORDER — SODIUM CHLORIDE 0.9 % IV SOLN
Freq: Once | INTRAVENOUS | Status: AC
  Administered 2015-05-08: 14:00:00 via INTRAVENOUS

## 2015-05-08 NOTE — Progress Notes (Signed)
..  Alejandra Barr here for iron infusion today.

## 2015-05-08 NOTE — Patient Instructions (Signed)
Return as scheduled 

## 2015-06-19 ENCOUNTER — Encounter (HOSPITAL_COMMUNITY): Payer: BLUE CROSS/BLUE SHIELD | Attending: Hematology & Oncology

## 2015-06-19 DIAGNOSIS — D509 Iron deficiency anemia, unspecified: Secondary | ICD-10-CM | POA: Insufficient documentation

## 2015-06-19 DIAGNOSIS — Z9884 Bariatric surgery status: Secondary | ICD-10-CM | POA: Diagnosis not present

## 2015-06-19 LAB — CBC WITH DIFFERENTIAL/PLATELET
Basophils Absolute: 0.1 10*3/uL (ref 0.0–0.1)
Basophils Relative: 1 % (ref 0–1)
Eosinophils Absolute: 0.7 10*3/uL (ref 0.0–0.7)
Eosinophils Relative: 9 % — ABNORMAL HIGH (ref 0–5)
HCT: 37.8 % (ref 36.0–46.0)
Hemoglobin: 12.5 g/dL (ref 12.0–15.0)
Lymphocytes Relative: 33 % (ref 12–46)
Lymphs Abs: 2.6 10*3/uL (ref 0.7–4.0)
MCH: 31.6 pg (ref 26.0–34.0)
MCHC: 33.1 g/dL (ref 30.0–36.0)
MCV: 95.7 fL (ref 78.0–100.0)
Monocytes Absolute: 0.4 10*3/uL (ref 0.1–1.0)
Monocytes Relative: 6 % (ref 3–12)
Neutro Abs: 4.1 10*3/uL (ref 1.7–7.7)
Neutrophils Relative %: 51 % (ref 43–77)
Platelets: 246 10*3/uL (ref 150–400)
RBC: 3.95 MIL/uL (ref 3.87–5.11)
RDW: 13.3 % (ref 11.5–15.5)
WBC: 7.9 10*3/uL (ref 4.0–10.5)

## 2015-06-19 LAB — IRON AND TIBC
Iron: 60 ug/dL (ref 28–170)
Saturation Ratios: 19 % (ref 10.4–31.8)
TIBC: 312 ug/dL (ref 250–450)
UIBC: 252 ug/dL

## 2015-06-19 LAB — FERRITIN: Ferritin: 30 ng/mL (ref 11–307)

## 2015-06-20 NOTE — Progress Notes (Signed)
Labs drawn

## 2015-06-25 ENCOUNTER — Other Ambulatory Visit (HOSPITAL_COMMUNITY): Payer: Self-pay | Admitting: Hematology & Oncology

## 2015-07-02 ENCOUNTER — Encounter (HOSPITAL_BASED_OUTPATIENT_CLINIC_OR_DEPARTMENT_OTHER): Payer: BLUE CROSS/BLUE SHIELD

## 2015-07-02 ENCOUNTER — Encounter (HOSPITAL_COMMUNITY): Payer: Self-pay

## 2015-07-02 DIAGNOSIS — D509 Iron deficiency anemia, unspecified: Secondary | ICD-10-CM | POA: Diagnosis not present

## 2015-07-02 MED ORDER — SODIUM CHLORIDE 0.9 % IV SOLN
INTRAVENOUS | Status: DC
  Administered 2015-07-02: 14:00:00 via INTRAVENOUS

## 2015-07-02 MED ORDER — SODIUM CHLORIDE 0.9 % IV SOLN
510.0000 mg | Freq: Once | INTRAVENOUS | Status: AC
  Administered 2015-07-02: 510 mg via INTRAVENOUS
  Filled 2015-07-02: qty 17

## 2015-07-02 NOTE — Patient Instructions (Signed)
[facility] at [facility] Discharge Instructions  RECOMMENDATIONS MADE BY THE CONSULTANT AND ANY TEST RESULTS WILL BE SENT TO YOUR REFERRING PHYSICIAN.  Iron infusion today. Return as scheduled for lab work and office visit.    Thank you for choosing [facility] at [facility] to provide your oncology and hematology care.  To afford each patient quality time with our provider, please arrive at least 15 minutes before your scheduled appointment time.    You need to re-schedule your appointment should you arrive 10 or more minutes late.  We strive to give you quality time with our providers, and arriving late affects you and other patients whose appointments are after yours.  Also, if you no show three or more times for appointments you may be dismissed from the clinic at the providers discretion.     Again, thank you for choosing [facility].  Our hope is that these requests will decrease the amount of time that you wait before being seen by our physicians.       _____________________________________________________________  Should you have questions after your visit to  Cancer Center, please contact our office at [phone] between the hours of 8:30 a.m. and 4:30 p.m.  Voicemails left after 4:30 p.m. will not be returned until the following business day.  For prescription refill requests, have your pharmacy contact our office.    

## 2015-07-02 NOTE — Progress Notes (Signed)
1500:  Tolerated infusion w/o adverse reaction.  VSS.  Pt refuses to wait for 30 min post infusion observation period - states she needs to get back to work.

## 2015-07-22 ENCOUNTER — Other Ambulatory Visit: Payer: Self-pay | Admitting: Family Medicine

## 2015-08-20 ENCOUNTER — Encounter (HOSPITAL_COMMUNITY): Payer: BLUE CROSS/BLUE SHIELD | Attending: Hematology & Oncology

## 2015-08-20 DIAGNOSIS — Z9884 Bariatric surgery status: Secondary | ICD-10-CM | POA: Insufficient documentation

## 2015-08-20 DIAGNOSIS — D509 Iron deficiency anemia, unspecified: Secondary | ICD-10-CM | POA: Insufficient documentation

## 2015-08-21 ENCOUNTER — Ambulatory Visit: Payer: BLUE CROSS/BLUE SHIELD | Admitting: Family Medicine

## 2015-08-29 ENCOUNTER — Encounter (HOSPITAL_COMMUNITY): Payer: Self-pay

## 2015-09-03 ENCOUNTER — Ambulatory Visit (INDEPENDENT_AMBULATORY_CARE_PROVIDER_SITE_OTHER): Payer: BLUE CROSS/BLUE SHIELD | Admitting: Family Medicine

## 2015-09-03 ENCOUNTER — Telehealth: Payer: Self-pay | Admitting: Orthopedic Surgery

## 2015-09-03 ENCOUNTER — Encounter: Payer: Self-pay | Admitting: Family Medicine

## 2015-09-03 VITALS — BP 104/66 | HR 68 | Resp 18 | Ht 62.0 in | Wt 222.0 lb

## 2015-09-03 DIAGNOSIS — G43109 Migraine with aura, not intractable, without status migrainosus: Secondary | ICD-10-CM | POA: Diagnosis not present

## 2015-09-03 DIAGNOSIS — Z1329 Encounter for screening for other suspected endocrine disorder: Secondary | ICD-10-CM | POA: Diagnosis not present

## 2015-09-03 DIAGNOSIS — R7302 Impaired glucose tolerance (oral): Secondary | ICD-10-CM

## 2015-09-03 DIAGNOSIS — Z23 Encounter for immunization: Secondary | ICD-10-CM

## 2015-09-03 DIAGNOSIS — E559 Vitamin D deficiency, unspecified: Secondary | ICD-10-CM

## 2015-09-03 DIAGNOSIS — Z13220 Encounter for screening for lipoid disorders: Secondary | ICD-10-CM

## 2015-09-03 MED ORDER — BUTALBITAL-APAP-CAFFEINE 50-325-40 MG PO TABS: ORAL_TABLET | ORAL | Status: DC

## 2015-09-03 MED ORDER — TOPIRAMATE 100 MG PO TABS: 100.0000 mg | ORAL_TABLET | Freq: Two times a day (BID) | ORAL | Status: DC

## 2015-09-03 NOTE — Progress Notes (Signed)
   Subjective:    Patient ID: Alejandra Barr, female    DOB: [birth year], 29 y.o.   MRN: [id number]  HPI   Alejandra Barr     MRN: [id number]      DOB: [birth year]   HPI [name] is here for follow up and re-evaluation of chronic medical conditions, medication management and review of any available recent lab and radiology data.  Preventive health is updated, specifically  Cancer screening and Immunization.   Questions or concerns regarding consultations or procedures which the PT has had in the interim are  Addressed.Disappointed and frustrated and anxious due to lack of conception to current time, also anxious to improve her health, stop smoking ,a nd lose weight , in the event that she does conceiuve The PT denies any adverse reactions to current medications since the last visit.  There are no new concerns.  There are no specific complaints   ROS Denies recent fever or chills. Denies sinus pressure, nasal congestion, ear pain or sore throat. Denies chest congestion, productive cough or wheezing. Denies chest pains, palpitations and leg swelling Denies abdominal pain, nausea, vomiting,diarrhea or constipation.   Denies dysuria, frequency, hesitancy or incontinence. Denies joint pain, swelling and limitation in mobility.   Denies skin break down or rash.   PE  BP 104/66 mmHg  Pulse 68  Resp 18  Ht 5\' 2"  (1.575 m)  Wt 222 lb (100.699 kg)  BMI 40.59 kg/m2  SpO2 98%  Patient alert and oriented and in no cardiopulmonary distress.  HEENT: No facial asymmetry, EOMI,   oropharynx pink and moist.  Neck supple no JVD, no mass.  Chest: Clear to auscultation bilaterally.  CVS: S1, S2 no murmurs, no S3.Regular rate.  ABD: Soft non tender.   Ext: No edema  MS: Adequate ROM spine, shoulders, hips and knees.  Skin: Intact, no ulcerations or rash noted.  Psych: Good eye contact, normal affect. Memory intact not anxious or depressed appearing.  CNS: CN 2-12 intact, power,   normal throughout.no focal deficits noted.   Assessment & Plan   Migraine headache with aura Increased in frequency and severity, uncontrolled, will start preventive as well as definitive management. Keep diary  So that she can identify and avoid triggers as able Not able to conceive for several months as they will need to harvest sperm from her husban's bank  Morbid obesity ([place]) Deteriorated. Patient re-educated about  the importance of commitment to a  minimum of 150 minutes of exercise per week.  The importance of healthy food choices with portion control discussed. Encouraged to start a food diary, count calories and to consider  joining a support group. Sample diet sheets offered. Goals set by the patient for the next several months.   Weight /BMI 09/03/2015 04/19/2015 03/05/2015  WEIGHT 222 lb 199 lb 1.6 oz 190 lb  HEIGHT 5\' 2"  - 5\' 2"   BMI 40.59 kg/m2 36.41 kg/m2 34.74 kg/m2    Current exercise per week 90 minutes.        Review of Systems     Objective:   Physical Exam        Assessment & Plan:

## 2015-09-03 NOTE — Telephone Encounter (Signed)
Patient aware and med resent

## 2015-09-03 NOTE — Telephone Encounter (Signed)
Patient is calling stating that 1 of her prescriptions did not get called to the pharmacy, the pharmacy states that they have the topiramate (TOPAMAX) 100 MG tablet  Not the second Rx, please advise?

## 2015-09-03 NOTE — Patient Instructions (Signed)
F/u in 3.5 month, call if you need me sooner  Flu vaccine today  Fasting labs asap  Please work on good  health habits so that your health will improve. 1. Commitment to daily physical activity for 30 to 60  minutes, if you are able to do this.  2. Commitment to wise food choices. Aim for half of your  food intake to be vegetable and fruit, one quarter starchy foods, and one quarter protein. Try to eat on a regular schedule  3 meals per day, snacking between meals should be limited to vegetables or fruits or small portions of nuts. 64 ounces of water per day is generally recommended, unless you have specific health conditions, like heart failure or kidney failure where you will need to limit fluid intake.  3. Commitment to sufficient and a  good quality of physical and mental rest daily, generally between 6 to 8 hours per day.  WITH PERSISTANCE AND PERSEVERANCE, THE IMPOSSIBLE , BECOMES THE NORM! Call insurance company for "health coach support " as far as weight loss is concerned   Weight loss goal of 4 to 5 pounds per month  Thanks for choosing [facility], we consider it a privelige to serve you.  New are topamax and fioricet for headaches which you are having 3 times per week PLS celebrate the good changes you have made, and DO NOT GIVE UP ON YOUR DREAMS

## 2015-09-06 ENCOUNTER — Encounter (HOSPITAL_COMMUNITY): Payer: BLUE CROSS/BLUE SHIELD | Attending: Hematology & Oncology

## 2015-09-06 DIAGNOSIS — Z9884 Bariatric surgery status: Secondary | ICD-10-CM | POA: Diagnosis not present

## 2015-09-06 DIAGNOSIS — D509 Iron deficiency anemia, unspecified: Secondary | ICD-10-CM | POA: Insufficient documentation

## 2015-09-06 LAB — CBC WITH DIFFERENTIAL/PLATELET
Basophils Absolute: 0.1 10*3/uL (ref 0.0–0.1)
Basophils Relative: 1 %
Eosinophils Absolute: 0.4 10*3/uL (ref 0.0–0.7)
Eosinophils Relative: 5 %
HCT: 39 % (ref 36.0–46.0)
Hemoglobin: 13.1 g/dL (ref 12.0–15.0)
Lymphocytes Relative: 21 %
Lymphs Abs: 1.7 10*3/uL (ref 0.7–4.0)
MCH: 31.8 pg (ref 26.0–34.0)
MCHC: 33.6 g/dL (ref 30.0–36.0)
MCV: 94.7 fL (ref 78.0–100.0)
Monocytes Absolute: 0.4 10*3/uL (ref 0.1–1.0)
Monocytes Relative: 5 %
Neutro Abs: 5.5 10*3/uL (ref 1.7–7.7)
Neutrophils Relative %: 68 %
Platelets: 276 10*3/uL (ref 150–400)
RBC: 4.12 MIL/uL (ref 3.87–5.11)
RDW: 12.6 % (ref 11.5–15.5)
WBC: 8.1 10*3/uL (ref 4.0–10.5)

## 2015-09-06 LAB — FERRITIN: Ferritin: 57 ng/mL (ref 11–307)

## 2015-09-06 LAB — IRON AND TIBC
Iron: 137 ug/dL (ref 28–170)
Saturation Ratios: 41 % — ABNORMAL HIGH (ref 10.4–31.8)
TIBC: 330 ug/dL (ref 250–450)
UIBC: 193 ug/dL

## 2015-09-06 NOTE — Progress Notes (Signed)
LABS DRAWN

## 2015-09-09 ENCOUNTER — Other Ambulatory Visit (HOSPITAL_COMMUNITY): Payer: Self-pay | Admitting: Hematology & Oncology

## 2015-09-10 ENCOUNTER — Other Ambulatory Visit (HOSPITAL_COMMUNITY): Payer: Self-pay | Admitting: Hematology & Oncology

## 2015-09-16 ENCOUNTER — Encounter (HOSPITAL_COMMUNITY): Payer: Self-pay

## 2015-09-16 ENCOUNTER — Encounter (HOSPITAL_BASED_OUTPATIENT_CLINIC_OR_DEPARTMENT_OTHER): Payer: BLUE CROSS/BLUE SHIELD

## 2015-09-16 VITALS — BP 92/52 | HR 80 | Temp 98.5°F | Resp 18

## 2015-09-16 DIAGNOSIS — D509 Iron deficiency anemia, unspecified: Secondary | ICD-10-CM

## 2015-09-16 MED ORDER — SODIUM CHLORIDE 0.9 % IV SOLN
INTRAVENOUS | Status: DC
  Administered 2015-09-16: 15:00:00 via INTRAVENOUS

## 2015-09-16 MED ORDER — FERUMOXYTOL INJECTION 510 MG/17 ML
510.0000 mg | Freq: Once | INTRAVENOUS | Status: AC
  Administered 2015-09-16: 510 mg via INTRAVENOUS
  Filled 2015-09-16: qty 17

## 2015-09-16 NOTE — Progress Notes (Signed)
Alejandra Barr Tolerated iron infusion well  Discharged ambulatory

## 2015-09-16 NOTE — Patient Instructions (Signed)
[place] at [facility] Discharge Instructions  RECOMMENDATIONS MADE BY THE CONSULTANT AND ANY TEST RESULTS WILL BE SENT TO YOUR REFERRING PHYSICIAN.  Iron transfusion today  Return as scheduled Please call the clinic if you have any questions or concerns  Thank you for choosing [place] at [facility] to provide your oncology and hematology care.  To afford each patient quality time with our provider, please arrive at least 15 minutes before your scheduled appointment time.    You need to re-schedule your appointment should you arrive 10 or more minutes late.  We strive to give you quality time with our providers, and arriving late affects you and other patients whose appointments are after yours.  Also, if you no show three or more times for appointments you may be dismissed from the clinic at the providers discretion.     Again, thank you for choosing [facility].  Our hope is that these requests will decrease the amount of time that you wait before being seen by our physicians.       _____________________________________________________________  Should you have questions after your visit to [facility], please contact our office at [phone] between the hours of 8:30 a.m. and 4:30 p.m.  Voicemails left after 4:30 p.m. will not be returned until the following business day.  For prescription refill requests, have your pharmacy contact our office.

## 2015-09-19 LAB — LIPID PANEL
Cholesterol: 165 mg/dL (ref 125–200)
HDL: 57 mg/dL (ref >=46)
LDL Cholesterol: 82 mg/dL (ref <130)
Total CHOL/HDL Ratio: 2.9 Ratio (ref <=5.0)
Triglycerides: 130 mg/dL (ref <150)
VLDL: 26 mg/dL (ref <30)

## 2015-09-19 LAB — HEMOGLOBIN A1C
Hgb A1c MFr Bld: 4.8 % (ref <5.7)
Mean Plasma Glucose: 91 mg/dL (ref <117)

## 2015-09-19 LAB — TSH: TSH: 1.999 u[IU]/mL (ref 0.350–4.500)

## 2015-09-19 LAB — VITAMIN D 25 HYDROXY (VIT D DEFICIENCY, FRACTURES): Vit D, 25-Hydroxy: 31 ng/mL (ref 30–100)

## 2015-09-21 NOTE — Assessment & Plan Note (Signed)
No onger smoking since July 2016 applauded on this

## 2015-09-21 NOTE — Assessment & Plan Note (Signed)
Deteriorated. Patient re-educated about  the importance of commitment to a  minimum of 150 minutes of exercise per week.  The importance of healthy food choices with portion control discussed. Encouraged to start a food diary, count calories and to consider  joining a support group. Sample diet sheets offered. Goals set by the patient for the next several months.   Weight /BMI 09/03/2015 04/19/2015 03/05/2015  WEIGHT 222 lb 199 lb 1.6 oz 190 lb  HEIGHT 5\' 2"  - 5\' 2"   BMI 40.59 kg/m2 36.41 kg/m2 34.74 kg/m2    Current exercise per week 90 minutes.

## 2015-09-21 NOTE — Assessment & Plan Note (Signed)
Updated lab needed to determine need to supplement.

## 2015-09-21 NOTE — Assessment & Plan Note (Signed)
Increased in frequency and severity, uncontrolled, will start preventive as well as definitive management. Keep diary  So that she can identify and avoid triggers as able Not able to conceive for several months as they will need to harvest sperm from her husban's bank

## 2015-10-18 ENCOUNTER — Encounter (HOSPITAL_COMMUNITY): Payer: BLUE CROSS/BLUE SHIELD

## 2015-10-18 ENCOUNTER — Ambulatory Visit (HOSPITAL_COMMUNITY): Payer: BLUE CROSS/BLUE SHIELD | Admitting: Hematology & Oncology

## 2015-11-12 ENCOUNTER — Ambulatory Visit (HOSPITAL_COMMUNITY): Payer: BLUE CROSS/BLUE SHIELD | Admitting: Hematology & Oncology

## 2015-11-12 ENCOUNTER — Encounter (HOSPITAL_COMMUNITY): Payer: BLUE CROSS/BLUE SHIELD

## 2015-11-12 NOTE — Progress Notes (Signed)
This encounter was created in error - please disregard.

## 2015-11-20 ENCOUNTER — Encounter (HOSPITAL_COMMUNITY): Payer: BLUE CROSS/BLUE SHIELD

## 2015-11-21 ENCOUNTER — Other Ambulatory Visit: Payer: Self-pay | Admitting: Family Medicine

## 2015-12-03 ENCOUNTER — Other Ambulatory Visit: Payer: Self-pay | Admitting: Family Medicine

## 2015-12-05 ENCOUNTER — Other Ambulatory Visit: Payer: Self-pay | Admitting: Family Medicine

## 2015-12-05 ENCOUNTER — Encounter (HOSPITAL_COMMUNITY): Payer: BLUE CROSS/BLUE SHIELD

## 2015-12-05 ENCOUNTER — Encounter (HOSPITAL_COMMUNITY): Payer: BLUE CROSS/BLUE SHIELD | Attending: Hematology & Oncology | Admitting: Hematology & Oncology

## 2015-12-05 ENCOUNTER — Encounter (HOSPITAL_COMMUNITY): Payer: Self-pay | Admitting: Hematology & Oncology

## 2015-12-05 VITALS — BP 109/72 | HR 93 | Temp 98.9°F | Resp 16 | Wt 222.0 lb

## 2015-12-05 DIAGNOSIS — E538 Deficiency of other specified B group vitamins: Secondary | ICD-10-CM | POA: Diagnosis not present

## 2015-12-05 DIAGNOSIS — Z9884 Bariatric surgery status: Secondary | ICD-10-CM | POA: Diagnosis not present

## 2015-12-05 DIAGNOSIS — Z9889 Other specified postprocedural states: Secondary | ICD-10-CM | POA: Insufficient documentation

## 2015-12-05 DIAGNOSIS — D509 Iron deficiency anemia, unspecified: Secondary | ICD-10-CM | POA: Diagnosis not present

## 2015-12-05 DIAGNOSIS — D500 Iron deficiency anemia secondary to blood loss (chronic): Secondary | ICD-10-CM

## 2015-12-05 DIAGNOSIS — J45909 Unspecified asthma, uncomplicated: Secondary | ICD-10-CM | POA: Insufficient documentation

## 2015-12-05 DIAGNOSIS — F329 Major depressive disorder, single episode, unspecified: Secondary | ICD-10-CM | POA: Insufficient documentation

## 2015-12-05 DIAGNOSIS — Z9049 Acquired absence of other specified parts of digestive tract: Secondary | ICD-10-CM | POA: Insufficient documentation

## 2015-12-05 DIAGNOSIS — N920 Excessive and frequent menstruation with regular cycle: Secondary | ICD-10-CM

## 2015-12-05 LAB — CBC WITH DIFFERENTIAL/PLATELET
Basophils Absolute: 0.1 10*3/uL (ref 0.0–0.1)
Basophils Relative: 1 %
Eosinophils Absolute: 0.4 10*3/uL (ref 0.0–0.7)
Eosinophils Relative: 3 %
HCT: 40.3 % (ref 36.0–46.0)
Hemoglobin: 13.4 g/dL (ref 12.0–15.0)
Lymphocytes Relative: 17 %
Lymphs Abs: 2 10*3/uL (ref 0.7–4.0)
MCH: 31.3 pg (ref 26.0–34.0)
MCHC: 33.3 g/dL (ref 30.0–36.0)
MCV: 94.2 fL (ref 78.0–100.0)
Monocytes Absolute: 0.7 10*3/uL (ref 0.1–1.0)
Monocytes Relative: 6 %
Neutro Abs: 8.7 10*3/uL — ABNORMAL HIGH (ref 1.7–7.7)
Neutrophils Relative %: 73 %
Platelets: 296 10*3/uL (ref 150–400)
RBC: 4.28 MIL/uL (ref 3.87–5.11)
RDW: 13.4 % (ref 11.5–15.5)
WBC: 11.8 10*3/uL — ABNORMAL HIGH (ref 4.0–10.5)

## 2015-12-05 LAB — IRON AND TIBC
Iron: 80 ug/dL (ref 28–170)
Saturation Ratios: 26 % (ref 10.4–31.8)
TIBC: 308 ug/dL (ref 250–450)
UIBC: 228 ug/dL

## 2015-12-05 LAB — FERRITIN: Ferritin: 64 ng/mL (ref 11–307)

## 2015-12-05 MED ORDER — FERROUS SULFATE 300 (60 FE) MG/5ML PO SYRP: 300.0000 mg | ORAL_SOLUTION | Freq: Two times a day (BID) | ORAL | Status: DC

## 2015-12-05 NOTE — Progress Notes (Signed)
Alejandra Nakayama, MD [address] [place] [address]  Gastric Bypass Iron deficiency anemia secondary to malabsorption Iron deficiency anemia secondary to menometrorrhagia B12 deficiency  CURRENT THERAPY: IV iron replacement  INTERVAL HISTORY: Alejandra Barr 30 y.o. female returns for routine follow-up of iron deficiency anemia. She has a history of gastric bypass surgery and irregular/heavy menses. She also has a history of B12 deficiency.  She takes a prenatal vitamin twice daily. She would like to try to have a child.  Ms. Nevill is alone and here to discuss recent lab results.  She is feeling okay, a little tired off and on though not much different than usual. She has increased her exercise routine by walking 4 miles daily. She is currently taking a prenatal vitamin. She and her husband are scheduled for a consultation about IVF.  The patient was curious if she should switch to oral iron.    Past Medical History  Diagnosis Date  . Asthma   . Chronic anxiety   . Chronic depression   . Obesity   . Iron deficiency   . 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 Atrial flutter ([place])     Status post RFA by Dr. Lovena Le in 2004    has Morbid obesity ([place]); Iron deficiency anemia; BIPOLAR II DISORDER; ANXIETY, CHRONIC; NICOTINE ADDICTION; CLUSTER HEADACHE; Atrial flutter ([place]); Asthma, mild intermittent, well-controlled; PALPITATIONS; Depression; Vitamin D deficiency; Insomnia; GAD (generalized anxiety disorder); Low vitamin B12 level; Abnormal antibody titer--Strongyloides; Abdominal pain, other specified site; Migraine headache with aura; Encounter for genetic counseling and testing; and Encounter for preconception consultation on her problem list.     No history exists.     is allergic to ciprofloxacin; morphine; morphine and related; and penicillins.  Ms. Leithead had no medications administered during this visit.  Past Surgical History  Procedure Laterality  Date  . Cholecystectomy  2003  . Tonsillectomy  1990  . Bilateral eustachian tube placement on 4 different occasions    . Gastric bypass  2006  . Colonoscopy N/A 11/29/2013    Procedure: COLONOSCOPY;  Surgeon: Rogene Houston, MD;  [facility];  Service: Endoscopy;  Laterality: N/A;  120-rescheduled to 300 Ann notified pt    Review of Systems  Constitutional: Negative for fever, chills, and weight loss.       Currently trying to get pregnant. HENT: Negative for congestion, hearing loss, nosebleeds, sore throat and tinnitus.   Eyes: Negative for blurred vision, double vision, pain and discharge.  Respiratory: Negative for cough, hemoptysis, sputum production, shortness of breath and wheezing.   Cardiovascular: Negative for chest pain, palpitations, claudication, leg swelling and PND.  Gastrointestinal: Negative for heartburn, nausea, vomiting, abdominal pain, diarrhea, constipation, blood in stool and melena.  Genitourinary: Negative for dysuria, urgency, frequency and hematuria.  Musculoskeletal: Negative for myalgias, joint pain and falls.  Skin: Negative for itching and rash.  Neurological: Negative for dizziness, tingling, tremors, sensory change, speech change, focal weakness, seizures, loss of consciousness, weakness and headaches.  Endo/Heme/Allergies: Does not bruise/bleed easily.  Psychiatric/Behavioral: Negative for depression, suicidal ideas, memory loss and substance abuse. The patient is not nervous/anxious and does not have insomnia.     PHYSICAL EXAMINATION  ECOG PERFORMANCE STATUS: 0 - Asymptomatic  Filed Vitals:   12/05/15 1200  BP: 109/72  Pulse: 93  Temp: 98.9 F (37.2 C)  Resp: 16    Physical Exam  Constitutional: She is oriented to person, place, and time  and well-developed, well-nourished, and in no distress.  HENT:  Head: Normocephalic and atraumatic.  Nose: Nose normal.  Mouth/Throat: Oropharynx is clear and moist.  Mild cheilitis both  sides of her mouth. Eyes: Conjunctivae are normal. Pupils are equal, round, and reactive to light. No scleral icterus.  Neck: Normal range of motion. No tracheal deviation present. No thyromegaly present.  Cardiovascular: Normal rate and regular rhythm.   No murmur heard. Pulmonary/Chest: Effort normal and breath sounds normal. No respiratory distress. She has no wheezes. She has no rales.  Abdominal: Soft. She exhibits no distension and no mass. There is no tenderness. There is no rebound and no guarding.  Musculoskeletal: Normal range of motion. She exhibits no edema.  Lymphadenopathy:    She has no cervical adenopathy.  Neurological: She is alert and oriented to person, place, and time. No cranial nerve deficit.  Skin: Skin is warm and dry. No erythema.  Psychiatric: Mood, memory, affect and judgment normal.    LABORATORY DATA: I have reviewed the data as listed. CBC    Component Value Date/Time   WBC 11.8* 12/05/2015 1223   RBC 4.28 12/05/2015 1223   RBC 4.71 04/13/2008 1652   HGB 13.4 12/05/2015 1223   HCT 40.3 12/05/2015 1223   PLT 296 12/05/2015 1223   MCV 94.2 12/05/2015 1223   MCH 31.3 12/05/2015 1223   MCHC 33.3 12/05/2015 1223   RDW 13.4 12/05/2015 1223   LYMPHSABS 2.0 12/05/2015 1223   MONOABS 0.7 12/05/2015 1223   EOSABS 0.4 12/05/2015 1223   BASOSABS 0.1 12/05/2015 1223    Iron/TIBC/Ferritin/ %Sat    Component Value Date/Time   IRON 137 09/06/2015 1023   TIBC 330 09/06/2015 1023   FERRITIN 57 09/06/2015 1023   IRONPCTSAT 41* 09/06/2015 1023    ASSESSMENT/PLAN  Gastric bypass Iron malabsorption B12 deficiency Menorrhagia  The patient is scheduled for consultation regarding IVF.  I have written her a prescription for oral iron. She will receive an iron infusion if needed based on today's results. Additional iron infusions will be held off secondary to her attempts to become pregnant  She will return for labs in 3 months and follow-up in 6 months.    Orders Placed This Encounter  Procedures  . CBC with Differential    Standing Status: Standing     Number of Occurrences: 10     Standing Expiration Date: 12/04/2016  . Ferritin    Standing Status: Standing     Number of Occurrences: 10     Standing Expiration Date: 12/04/2016    All questions were answered. The patient knows to call the clinic with any problems, questions or concerns. We can certainly see the patient much sooner if necessary.  This document serves as a record of services personally performed by Ancil Linsey, MD. It was created on her behalf by Arlyce Harman, a trained medical scribe. The creation of this record is based on the scribe's personal observations and the provider's statements to them. This document has been checked and approved by the attending provider.  I have reviewed the above documentation for accuracy and completeness, and I agree with the above.  This note was signed electronically  Molli Hazard, MD  12/05/2015

## 2015-12-05 NOTE — Patient Instructions (Addendum)
[place] at [facility] Discharge Instructions  RECOMMENDATIONS MADE BY THE CONSULTANT AND ANY TEST RESULTS WILL BE SENT TO YOUR REFERRING PHYSICIAN.    Exam and discussion by Dr Whitney Muse today Hemoglobin 13.4  Ferritin is still pending, we will call you with those results  switch to oral iron, you can mix this in 4oz of orange juice. Labs in 3 and 6 months Return to see the doctor in 6 months Please call the clinic if you have any questions or concerns    Thank you for choosing [place] at [facility] to provide your oncology and hematology care.  To afford each patient quality time with our provider, please arrive at least 15 minutes before your scheduled appointment time.   Beginning January 23rd 2017 lab work for the [facility] will be done in the  Main lab at [facility] on 1st floor. If you have a lab appointment with the [facility] please come in thru the  Main Entrance and check in at the main information desk  You need to re-schedule your appointment should you arrive 10 or more minutes late.  We strive to give you quality time with our providers, and arriving late affects you and other patients whose appointments are after yours.  Also, if you no show three or more times for appointments you may be dismissed from the clinic at the providers discretion.     Again, thank you for choosing [facility].  Our hope is that these requests will decrease the amount of time that you wait before being seen by our physicians.       _____________________________________________________________  Should you have questions after your visit to [facility], please contact our office at [phone] between the hours of 8:30 a.m. and 4:30 p.m.  Voicemails left after 4:30 p.m. will not be returned until the following business day.  For prescription refill requests, have your pharmacy contact our office.

## 2015-12-06 ENCOUNTER — Other Ambulatory Visit (HOSPITAL_COMMUNITY): Payer: Self-pay | Admitting: Hematology & Oncology

## 2015-12-18 ENCOUNTER — Encounter (HOSPITAL_BASED_OUTPATIENT_CLINIC_OR_DEPARTMENT_OTHER): Payer: BLUE CROSS/BLUE SHIELD

## 2015-12-18 VITALS — BP 106/50 | HR 77 | Temp 98.7°F | Resp 16

## 2015-12-18 DIAGNOSIS — D500 Iron deficiency anemia secondary to blood loss (chronic): Secondary | ICD-10-CM

## 2015-12-18 DIAGNOSIS — D509 Iron deficiency anemia, unspecified: Secondary | ICD-10-CM

## 2015-12-18 MED ORDER — SODIUM CHLORIDE 0.9 % IV SOLN
INTRAVENOUS | Status: DC
  Administered 2015-12-18: 14:00:00 via INTRAVENOUS

## 2015-12-18 MED ORDER — SODIUM CHLORIDE 0.9 % IV SOLN
510.0000 mg | Freq: Once | INTRAVENOUS | Status: AC
  Administered 2015-12-18: 510 mg via INTRAVENOUS
  Filled 2015-12-18: qty 17

## 2015-12-18 NOTE — Progress Notes (Signed)
IV removed from RT [facility] area. Site WNL. Gauze and bandaid applied.

## 2015-12-18 NOTE — Patient Instructions (Signed)
[place] at [facility] Discharge Instructions  RECOMMENDATIONS MADE BY THE CONSULTANT AND ANY TEST RESULTS WILL BE SENT TO YOUR REFERRING PHYSICIAN.  IV iron today.   Please return as scheduled.    Thank you for choosing [place] at [facility] to provide your oncology and hematology care.  To afford each patient quality time with our provider, please arrive at least 15 minutes before your scheduled appointment time.   Beginning January 23rd 2017 lab work for the [facility] will be done in the  Main lab at [facility] on 1st floor. If you have a lab appointment with the [facility] please come in thru the  Main Entrance and check in at the main information desk  You need to re-schedule your appointment should you arrive 10 or more minutes late.  We strive to give you quality time with our providers, and arriving late affects you and other patients whose appointments are after yours.  Also, if you no show three or more times for appointments you may be dismissed from the clinic at the providers discretion.     Again, thank you for choosing [facility].  Our hope is that these requests will decrease the amount of time that you wait before being seen by our physicians.       _____________________________________________________________  Should you have questions after your visit to [facility], please contact our office at [phone] between the hours of 8:30 a.m. and 4:30 p.m.  Voicemails left after 4:30 p.m. will not be returned until the following business day.  For prescription refill requests, have your pharmacy contact our office.

## 2015-12-18 NOTE — Progress Notes (Signed)
Patient tolerated infusion well.  VSS.   

## 2015-12-19 ENCOUNTER — Ambulatory Visit: Payer: BLUE CROSS/BLUE SHIELD | Admitting: Family Medicine

## 2015-12-30 ENCOUNTER — Ambulatory Visit: Payer: BLUE CROSS/BLUE SHIELD | Admitting: Family Medicine

## 2015-12-31 NOTE — Progress Notes (Signed)
This encounter was created in error - please disregard.

## 2016-01-16 ENCOUNTER — Encounter: Payer: Self-pay | Admitting: Family Medicine

## 2016-01-16 ENCOUNTER — Ambulatory Visit (INDEPENDENT_AMBULATORY_CARE_PROVIDER_SITE_OTHER): Payer: BLUE CROSS/BLUE SHIELD | Admitting: Family Medicine

## 2016-01-16 VITALS — BP 102/70 | HR 86 | Resp 18 | Ht 62.0 in | Wt 215.0 lb

## 2016-01-16 DIAGNOSIS — G43111 Migraine with aura, intractable, with status migrainosus: Secondary | ICD-10-CM | POA: Diagnosis not present

## 2016-01-16 DIAGNOSIS — N979 Female infertility, unspecified: Secondary | ICD-10-CM

## 2016-01-16 DIAGNOSIS — D509 Iron deficiency anemia, unspecified: Secondary | ICD-10-CM

## 2016-01-16 NOTE — Patient Instructions (Signed)
F/u in 6  Months, please call if you need me sooner  Labs are ordered as requested  CONGRATS on remaining nicotine free and all the best with upcoming appointments  You are referred to Dr Merlene Laughter for migraine management, his office will call you  Keep walking and increase plant based diet  Thanks for choosing [facility], we consider it a privelige to serve you.

## 2016-01-16 NOTE — Assessment & Plan Note (Signed)
Currently has weekly migraines, at least twice per week, no abortion of the headache, and no prophylaxis. Requests neuro appt with Dr Merlene Laughter

## 2016-01-16 NOTE — Progress Notes (Signed)
   Subjective:    Patient ID: Alejandra Barr, female    DOB: 1986-03-20, 30 y.o.   MRN: [id number]  HPI   Alejandra Barr     MRN: [id number]      DOB: December 12, 1985   HPI Alejandra Barr is here for follow up and re-evaluation of chronic medical conditions, medication management and review of any available recent lab and radiology data.  Preventive health is updated, specifically  Cancer screening and Immunization.   Questions or concerns regarding consultations or procedures which the PT has had in the interim are  Addressed.Has upcoming fertility appointment, is to have iVF, and requests specific labs to be ordered. The PT denies any adverse reactions to current medications since the last visit.  C/o increased frequency and severity of headaches, requests neuro referralo specific complaints  Working on healthy lifestyle for weight loss, remains nicotine free  ROS Denies recent fever or chills. Denies sinus pressure, nasal congestion, ear pain or sore throat. Denies chest congestion, productive cough or wheezing. Denies chest pains, palpitations and leg swelling Denies abdominal pain, nausea, vomiting,diarrhea or constipation.   Denies dysuria, frequency, hesitancy or incontinence. Denies joint pain, swelling and limitation in mobility. Denies depression, anxiety or insomnia. Denies skin break down or rash.   PE  BP 102/70 mmHg  Pulse 86  Resp 18  Ht 5\' 2"  (1.575 m)  Wt 215 lb (97.523 kg)  BMI 39.31 kg/m2  SpO2 98%  Patient alert and oriented and in no cardiopulmonary distress.  HEENT: No facial asymmetry, EOMI,   oropharynx pink and moist.  Neck supple no JVD, no mass.  Chest: Clear to auscultation bilaterally.  CVS: S1, S2 no murmurs, no S3.Regular rate.  ABD: Soft non tender.   Ext: No edema  MS: Adequate ROM spine, shoulders, hips and knees.  Skin: Intact, no ulcerations or rash noted.  Psych: Good eye contact, normal affect. Memory intact not anxious or depressed  appearing.  CNS: CN 2-12 intact, power,  normal throughout.no focal deficits noted.   Assessment & Plan   Migraine headache with aura Currently has weekly migraines, at least twice per week, no abortion of the headache, and no prophylaxis. Requests neuro appt with Alejandra Barr  Morbid obesity [facility]) Improved.. Patient re-educated about  the importance of commitment to a  minimum of 150 minutes of exercise per week.  The importance of healthy food choices with portion control discussed. Encouraged to start a food diary, count calories and to consider  joining a support group. Sample diet sheets offered. Goals set by the patient for the next several months.   Weight /BMI 01/16/2016 12/05/2015 09/03/2015  WEIGHT 215 lb 222 lb 222 lb  HEIGHT 5\' 2"  - 5\' 2"   BMI 39.31 kg/m2 40.59 kg/m2 40.59 kg/m2    Current exercise per week 150 minutes.   Iron deficiency anemia managed by hematology and responding to treatment      Review of Systems     Objective:   Physical Exam        Assessment & Plan:

## 2016-01-22 NOTE — Assessment & Plan Note (Signed)
managed by hematology and responding to treatment

## 2016-01-22 NOTE — Assessment & Plan Note (Signed)
Improved.. Patient re-educated about  the importance of commitment to a  minimum of 150 minutes of exercise per week.  The importance of healthy food choices with portion control discussed. Encouraged to start a food diary, count calories and to consider  joining a support group. Sample diet sheets offered. Goals set by the patient for the next several months.   Weight /BMI 01/16/2016 12/05/2015 09/03/2015  WEIGHT 215 lb 222 lb 222 lb  HEIGHT 5\' 2"  - 5\' 2"   BMI 39.31 kg/m2 40.59 kg/m2 40.59 kg/m2    Current exercise per week 150 minutes.

## 2016-01-24 ENCOUNTER — Other Ambulatory Visit: Payer: Self-pay

## 2016-01-24 MED ORDER — BUTALBITAL-APAP-CAFFEINE 50-325-40 MG PO TABS: ORAL_TABLET | ORAL | Status: DC

## 2016-02-18 ENCOUNTER — Other Ambulatory Visit: Payer: Self-pay

## 2016-02-18 DIAGNOSIS — G4700 Insomnia, unspecified: Secondary | ICD-10-CM | POA: Diagnosis not present

## 2016-02-18 DIAGNOSIS — G43009 Migraine without aura, not intractable, without status migrainosus: Secondary | ICD-10-CM | POA: Diagnosis not present

## 2016-02-18 DIAGNOSIS — E6609 Other obesity due to excess calories: Secondary | ICD-10-CM | POA: Diagnosis not present

## 2016-02-18 DIAGNOSIS — I482 Chronic atrial fibrillation: Secondary | ICD-10-CM | POA: Diagnosis not present

## 2016-02-18 MED ORDER — METOPROLOL SUCCINATE ER 25 MG PO TB24: 25.0000 mg | ORAL_TABLET | Freq: Every day | ORAL | Status: DC

## 2016-02-21 ENCOUNTER — Other Ambulatory Visit: Payer: Self-pay | Admitting: Family Medicine

## 2016-02-24 ENCOUNTER — Other Ambulatory Visit: Payer: Self-pay

## 2016-02-24 DIAGNOSIS — Z3141 Encounter for fertility testing: Secondary | ICD-10-CM | POA: Diagnosis not present

## 2016-02-24 MED ORDER — METOPROLOL SUCCINATE ER 25 MG PO TB24: 25.0000 mg | ORAL_TABLET | Freq: Every day | ORAL | Status: DC

## 2016-02-27 ENCOUNTER — Encounter (HOSPITAL_COMMUNITY): Payer: BLUE CROSS/BLUE SHIELD | Attending: Hematology & Oncology

## 2016-02-27 DIAGNOSIS — J45909 Unspecified asthma, uncomplicated: Secondary | ICD-10-CM | POA: Insufficient documentation

## 2016-02-27 DIAGNOSIS — Z9049 Acquired absence of other specified parts of digestive tract: Secondary | ICD-10-CM | POA: Insufficient documentation

## 2016-02-27 DIAGNOSIS — E538 Deficiency of other specified B group vitamins: Secondary | ICD-10-CM | POA: Diagnosis not present

## 2016-02-27 DIAGNOSIS — N920 Excessive and frequent menstruation with regular cycle: Secondary | ICD-10-CM | POA: Insufficient documentation

## 2016-02-27 DIAGNOSIS — Z9884 Bariatric surgery status: Secondary | ICD-10-CM | POA: Diagnosis not present

## 2016-02-27 DIAGNOSIS — F329 Major depressive disorder, single episode, unspecified: Secondary | ICD-10-CM | POA: Diagnosis not present

## 2016-02-27 DIAGNOSIS — Z9889 Other specified postprocedural states: Secondary | ICD-10-CM | POA: Insufficient documentation

## 2016-02-27 DIAGNOSIS — D509 Iron deficiency anemia, unspecified: Secondary | ICD-10-CM | POA: Insufficient documentation

## 2016-02-27 LAB — CBC WITH DIFFERENTIAL/PLATELET
Basophils Absolute: 0.1 10*3/uL (ref 0.0–0.1)
Basophils Relative: 1 %
Eosinophils Absolute: 0.5 10*3/uL (ref 0.0–0.7)
Eosinophils Relative: 4 %
HCT: 39.7 % (ref 36.0–46.0)
Hemoglobin: 13.5 g/dL (ref 12.0–15.0)
Lymphocytes Relative: 22 %
Lymphs Abs: 2.4 10*3/uL (ref 0.7–4.0)
MCH: 31.8 pg (ref 26.0–34.0)
MCHC: 34 g/dL (ref 30.0–36.0)
MCV: 93.4 fL (ref 78.0–100.0)
Monocytes Absolute: 0.3 10*3/uL (ref 0.1–1.0)
Monocytes Relative: 3 %
Neutro Abs: 7.4 10*3/uL (ref 1.7–7.7)
Neutrophils Relative %: 70 %
Platelets: 314 10*3/uL (ref 150–400)
RBC: 4.25 MIL/uL (ref 3.87–5.11)
RDW: 13.4 % (ref 11.5–15.5)
WBC: 10.6 10*3/uL — ABNORMAL HIGH (ref 4.0–10.5)

## 2016-02-27 LAB — FERRITIN: Ferritin: 222 ng/mL (ref 11–307)

## 2016-03-03 ENCOUNTER — Other Ambulatory Visit (HOSPITAL_COMMUNITY): Payer: BLUE CROSS/BLUE SHIELD

## 2016-03-09 ENCOUNTER — Other Ambulatory Visit: Payer: Self-pay | Admitting: Family Medicine

## 2016-03-12 ENCOUNTER — Other Ambulatory Visit: Payer: Self-pay

## 2016-03-12 MED ORDER — BUTALBITAL-APAP-CAFFEINE 50-325-40 MG PO TABS: ORAL_TABLET | ORAL | Status: DC

## 2016-04-15 ENCOUNTER — Other Ambulatory Visit: Payer: Self-pay | Admitting: Family Medicine

## 2016-04-22 DIAGNOSIS — G43711 Chronic migraine without aura, intractable, with status migrainosus: Secondary | ICD-10-CM | POA: Diagnosis not present

## 2016-04-22 DIAGNOSIS — I482 Chronic atrial fibrillation: Secondary | ICD-10-CM | POA: Diagnosis not present

## 2016-04-22 DIAGNOSIS — G4700 Insomnia, unspecified: Secondary | ICD-10-CM | POA: Diagnosis not present

## 2016-04-22 DIAGNOSIS — E6609 Other obesity due to excess calories: Secondary | ICD-10-CM | POA: Diagnosis not present

## 2016-05-04 ENCOUNTER — Other Ambulatory Visit: Payer: Self-pay

## 2016-05-04 MED ORDER — METOPROLOL SUCCINATE ER 25 MG PO TB24: 25.0000 mg | ORAL_TABLET | Freq: Every day | ORAL | Status: DC

## 2016-05-20 ENCOUNTER — Other Ambulatory Visit: Payer: Self-pay | Admitting: Family Medicine

## 2016-05-25 DIAGNOSIS — J0100 Acute maxillary sinusitis, unspecified: Secondary | ICD-10-CM | POA: Diagnosis not present

## 2016-06-01 ENCOUNTER — Encounter (HOSPITAL_COMMUNITY): Payer: BLUE CROSS/BLUE SHIELD | Attending: Hematology & Oncology

## 2016-06-01 DIAGNOSIS — D509 Iron deficiency anemia, unspecified: Secondary | ICD-10-CM | POA: Diagnosis not present

## 2016-06-01 LAB — CBC WITH DIFFERENTIAL/PLATELET
Basophils Absolute: 0.1 10*3/uL (ref 0.0–0.1)
Basophils Relative: 1 %
Eosinophils Absolute: 0.5 10*3/uL (ref 0.0–0.7)
Eosinophils Relative: 5 %
HCT: 39.9 % (ref 36.0–46.0)
Hemoglobin: 13.3 g/dL (ref 12.0–15.0)
Lymphocytes Relative: 30 %
Lymphs Abs: 3.2 10*3/uL (ref 0.7–4.0)
MCH: 30.6 pg (ref 26.0–34.0)
MCHC: 33.3 g/dL (ref 30.0–36.0)
MCV: 91.9 fL (ref 78.0–100.0)
Monocytes Absolute: 0.6 10*3/uL (ref 0.1–1.0)
Monocytes Relative: 5 %
Neutro Abs: 6.4 10*3/uL (ref 1.7–7.7)
Neutrophils Relative %: 59 %
Platelets: 332 10*3/uL (ref 150–400)
RBC: 4.34 MIL/uL (ref 3.87–5.11)
RDW: 13 % (ref 11.5–15.5)
WBC: 10.8 10*3/uL — ABNORMAL HIGH (ref 4.0–10.5)

## 2016-06-01 LAB — FERRITIN: Ferritin: 220 ng/mL (ref 11–307)

## 2016-06-03 ENCOUNTER — Ambulatory Visit (HOSPITAL_COMMUNITY): Payer: BLUE CROSS/BLUE SHIELD | Admitting: Oncology

## 2016-06-03 ENCOUNTER — Encounter (HOSPITAL_COMMUNITY): Payer: Self-pay | Admitting: Oncology

## 2016-06-03 NOTE — Progress Notes (Deleted)
[name], MD [address] [place] [address]  No diagnosis found.  CURRENT THERAPY: Ferrous sulfate syrup 300 mg BID.  IV iron infusions on hold due to patient's attempt for pregnancy  INTERVAL HISTORY: Alejandra Barr 30 y.o. 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 AND B12 deficiency.  ROS  Past Medical History:  Diagnosis Date  . Asthma   . Atrial flutter ([place])    Status post RFA by Dr. Lovena Le in 2004  . Chronic anxiety   . Chronic depression   . 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 Iron deficiency   . Obesity     Past Surgical History:  Procedure Laterality Date  . Bilateral eustachian tube placement on 4 different occasions    . CHOLECYSTECTOMY  2003  . COLONOSCOPY N/A 11/29/2013   Procedure: COLONOSCOPY;  Surgeon: Rogene Houston, MD;  Location: [facility];  Service: Endoscopy;  Laterality: N/A;  120-rescheduled to [address] Ann notified pt  . GASTRIC BYPASS  2006  . TONSILLECTOMY  1990    Family History  Problem Relation Age of Onset  . Fibromyalgia Mother     Chronic pelvic pain   . Breast cancer Mother   . Hypothyroidism Mother   . Obesity Mother   . Heart defect Mother     Supraventricular, tachycardia /tachypalpation     Social History   Social History  . Marital status: Married    Spouse name: N/A  . Number of children: N/A  . Years of education: N/A   Occupational History  . Nurse    .  [employer]   Social History Main Topics  . Smoking status: Former Smoker    Packs/day: 0.50    Types: Cigarettes    Start date: 03/21/2013    Quit date: 06/02/2015  . Smokeless tobacco: Never Used  . Alcohol use No  . Drug use: No  . Sexual activity: Yes    Birth control/ protection: None     Comment: husband had vasectomy   Other Topics Concern  . Not on file   Social History Narrative  . No  narrative on file     PHYSICAL EXAMINATION  ECOG PERFORMANCE STATUS: {CHL ONC ECOG PS:[phone]}  There were no vitals filed for this visit.  GENERAL:{CHL ONC PE [phone]} SKIN: {CHL ONC PE [id number] HEAD: {CHL ONC PE AE:[id number] EYES: {CHL ONC PE EY:[id number] EARS: {CHL ONC PE AC:[id number] OROPHARYNX:{CHL ONC PE OROPHARYNX:[phone]}  NECK: {CHL ONC PE FZ:[id number] LYMPH:  {CHL ONC PE RP:[id number] BREAST:{CHL ONC PE BREAST:[phone]} LUNGS: {CHL ONC PE LK:[id number] HEART: {CHL ONC PE LA:[id number] ABDOMEN:{CHL ONC PE ABDOMEN:[phone]} BACK: {CHL ONC PE YK:9999879 EXTREMITIES:{CHL ONC PE EXTREMITIES:[id number]}  NEURO: {CHL ONC PE NEURO:[phone]} PELVIC:{CHL ONC PE PELVIC:[phone]} RECTAL: {CHL ONC PE RECTAL:[phone]}   LABORATORY DATA: CBC    Component Value Date/Time   WBC 10.8 (H) 06/01/2016 1158   RBC 4.34 06/01/2016 1158   HGB 13.3 06/01/2016 1158   HCT 39.9 06/01/2016 1158   PLT 332 06/01/2016 1158   MCV 91.9 06/01/2016 1158   MCH 30.6 06/01/2016 1158   MCHC 33.3 06/01/2016 1158   RDW 13.0 06/01/2016 1158   LYMPHSABS 3.2 06/01/2016 1158   MONOABS 0.6 06/01/2016 1158   EOSABS 0.5 06/01/2016 1158   BASOSABS 0.1 06/01/2016 1158      Chemistry      Component  Value Date/Time   NA 140 04/19/2015 1100   K 4.4 04/19/2015 1100   CL 106 04/19/2015 1100   CO2 25 04/19/2015 1100   BUN 18 04/19/2015 1100   CREATININE 0.70 04/19/2015 1100   CREATININE 0.83 06/05/2014 1044      Component Value Date/Time   CALCIUM 9.2 04/19/2015 1100   ALKPHOS 90 04/19/2015 1100   AST 16 04/19/2015 1100   ALT 10 (L) 04/19/2015 1100   BILITOT 0.4 04/19/2015 1100     Lab Results  Component Value Date   IRON 80 12/05/2015   TIBC 308 12/05/2015   FERRITIN 220 06/01/2016   Lab Results  Component Value Date   VITAMINB12 272 10/19/2014    PENDING LABS:   RADIOGRAPHIC STUDIES:  No results  found.   PATHOLOGY:    ASSESSMENT AND PLAN:  No problem-specific Assessment & Plan notes found for this encounter.   ORDERS PLACED FOR THIS ENCOUNTER: No orders of the defined types were placed in this encounter.   MEDICATIONS PRESCRIBED THIS ENCOUNTER: No orders of the defined types were placed in this encounter.   THERAPY PLAN:  Continue with PO iron replacement therapy.  All questions were answered. The patient knows to call the clinic with any problems, questions or concerns. We can certainly see the patient much sooner if necessary.  Patient and plan discussed with Dr. Ancil Linsey and she is in agreement with the aforementioned.   This note is electronically signed by: Doy Mince 06/03/2016 8:11 AM

## 2016-06-03 NOTE — Assessment & Plan Note (Deleted)
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  Currently on Ferrous sulfate syrup 300 mg BID.  IV iron infusions on hold due to patient's attempt for pregnancy.  Oncology Flowsheet 12/18/2015  ferumoxytol [facility]) IV 510 mg   Labs today: CBC diff, ferritin.  I personally reviewed and went over laboratory results with the patient.  The results are noted within this dictation.  IV iron infusions are on hold due to the patient's attempt(s) at pregnancy.  She is responding nicely to PO iron, and therefore, she is likely absorbing PO iron.  Labs every 3 months: CBC diff, ferritin.  Return in 6 months for follow-up.

## 2016-06-30 DIAGNOSIS — G43719 Chronic migraine without aura, intractable, without status migrainosus: Secondary | ICD-10-CM | POA: Diagnosis not present

## 2016-07-02 ENCOUNTER — Other Ambulatory Visit: Payer: Self-pay | Admitting: Family Medicine

## 2016-07-03 DIAGNOSIS — G43711 Chronic migraine without aura, intractable, with status migrainosus: Secondary | ICD-10-CM | POA: Diagnosis not present

## 2016-07-14 ENCOUNTER — Ambulatory Visit (HOSPITAL_COMMUNITY): Payer: BLUE CROSS/BLUE SHIELD | Admitting: Oncology

## 2016-07-14 DIAGNOSIS — Z3141 Encounter for fertility testing: Secondary | ICD-10-CM | POA: Diagnosis not present

## 2016-07-16 ENCOUNTER — Telehealth: Payer: Self-pay | Admitting: Family Medicine

## 2016-07-16 NOTE — Telephone Encounter (Signed)
Called to reschedule - Dr. Simpson out of town °

## 2016-07-22 ENCOUNTER — Ambulatory Visit: Payer: BLUE CROSS/BLUE SHIELD | Admitting: Family Medicine

## 2016-07-27 ENCOUNTER — Other Ambulatory Visit: Payer: Self-pay

## 2016-07-27 MED ORDER — METOPROLOL SUCCINATE ER 25 MG PO TB24: 25.0000 mg | ORAL_TABLET | Freq: Every day | ORAL | 1 refills | Status: DC

## 2016-07-29 ENCOUNTER — Other Ambulatory Visit: Payer: Self-pay | Admitting: Family Medicine

## 2016-07-31 ENCOUNTER — Other Ambulatory Visit: Payer: Self-pay | Admitting: Family Medicine

## 2016-08-03 ENCOUNTER — Ambulatory Visit (HOSPITAL_COMMUNITY): Payer: BLUE CROSS/BLUE SHIELD | Admitting: Oncology

## 2016-08-12 ENCOUNTER — Ambulatory Visit (INDEPENDENT_AMBULATORY_CARE_PROVIDER_SITE_OTHER): Payer: BLUE CROSS/BLUE SHIELD | Admitting: Family Medicine

## 2016-08-12 ENCOUNTER — Encounter: Payer: Self-pay | Admitting: Family Medicine

## 2016-08-12 DIAGNOSIS — E559 Vitamin D deficiency, unspecified: Secondary | ICD-10-CM

## 2016-08-12 DIAGNOSIS — Z23 Encounter for immunization: Secondary | ICD-10-CM

## 2016-08-12 DIAGNOSIS — R002 Palpitations: Secondary | ICD-10-CM

## 2016-08-12 DIAGNOSIS — F411 Generalized anxiety disorder: Secondary | ICD-10-CM

## 2016-08-12 DIAGNOSIS — J4520 Mild intermittent asthma, uncomplicated: Secondary | ICD-10-CM

## 2016-08-12 DIAGNOSIS — D508 Other iron deficiency anemias: Secondary | ICD-10-CM | POA: Diagnosis not present

## 2016-08-12 NOTE — Patient Instructions (Addendum)
F/u in 5 month, call if you need me before  Fasting Labs  lipid, TSH, vit D and cmp Flu vaccine today'   It is important that you exercise regularly at least 30 minutes 7 times a week. If you develop chest pain, have severe difficulty breathing, or feel very tired, stop exercising immediately and seek medical attention   Congrats on weight loss and nicotine cessation  Thank you  for choosing [facility]. We consider it a privelige to serve you.  Delivering excellent health care in a caring and  compassionate way is our goal.  Partnering with you,  so that together we can achieve this goal is our strategy.   ALL the best with upcoming procedure, keep me informed

## 2016-08-13 ENCOUNTER — Ambulatory Visit (HOSPITAL_COMMUNITY): Payer: BLUE CROSS/BLUE SHIELD

## 2016-08-16 ENCOUNTER — Encounter: Payer: Self-pay | Admitting: Family Medicine

## 2016-08-16 NOTE — Assessment & Plan Note (Signed)
Updated lab needed at/ before next visit.   

## 2016-08-16 NOTE — Assessment & Plan Note (Signed)
Controlled, no change in medication  

## 2016-08-16 NOTE — Assessment & Plan Note (Signed)
Improved. Pt applauded on succesful weight loss through lifestyle change, and encouraged to continue same. Weight loss goal set for the next several months.  

## 2016-08-16 NOTE — Assessment & Plan Note (Signed)
No flares in past 6 months or more, on no maintenance medoication

## 2016-08-16 NOTE — Assessment & Plan Note (Signed)
Reports being headache free off of medication

## 2016-08-16 NOTE — Assessment & Plan Note (Signed)
Stable and controlled off of meds

## 2016-08-16 NOTE — Assessment & Plan Note (Signed)
managed by hematology and on supple,mental liquid iron

## 2016-08-16 NOTE — Progress Notes (Signed)
   Alejandra Barr     MRN: [id number]      DOB: Dec 01, 1985   HPI [name] is here for follow up and re-evaluation of chronic medical conditions, medication management and review of any available recent lab and radiology data.  Preventive health is updated, specifically  Cancer screening and Immunization.   Questions or concerns regarding consultations or procedures which the PT has had in the interim are  Addressed.Currently has embryos harvested and is hopeful for implantation in the next several weeks. States she has ahd a difficult time and had become very depressed at one time, denies substance abuse during this period and remains nicotine free, her greatest desire currently is to mother a child The PT denies any adverse reactions to current medications since the last visit.  Has had successful weigth loss on a ketogenic diet  ROS Denies recent fever or chills. Denies sinus pressure, nasal congestion, ear pain or sore throat. Denies chest congestion, productive cough or wheezing. Denies chest pains, palpitations and leg swelling Denies abdominal pain, nausea, vomiting,diarrhea or constipation.   Denies dysuria, frequency, hesitancy or incontinence. Denies joint pain, swelling and limitation in mobility. Denies headaches, seizures, numbness, or tingling. Denies depression, anxiety or insomnia. Denies skin break down or rash.   PE  BP 114/62 (BP Location: Left Arm, Patient Position: Sitting, Cuff Size: Large)   Pulse 70   Ht 5\' 2"  (1.575 m)   Wt 190 lb (86.2 kg)   SpO2 98%   BMI 34.75 kg/m   Patient alert and oriented and in no cardiopulmonary distress.  HEENT: No facial asymmetry, EOMI,   oropharynx pink and moist.  Neck supple no JVD, no mass.  Chest: Clear to auscultation bilaterally.  CVS: S1, S2 no murmurs, no S3.Regular rate.  ABD: Soft non tender.   Ext: No edema  MS: Adequate ROM spine, shoulders, hips and knees.  Skin: Intact, no ulcerations or rash  noted.  Psych: Good eye contact, normal affect. Memory intact not anxious or depressed appearing.  CNS: CN 2-12 intact, power,  normal throughout.no focal deficits noted.   Assessment & Plan  Iron deficiency anemia managed by hematology and on supple,mental liquid iron  Morbid obesity ([place]) Improved. Pt applauded on succesful weight loss through lifestyle change, and encouraged to continue same. Weight loss goal set for the next several months.   Asthma, mild intermittent, well-controlled No flares in past 6 months or more, on no maintenance medoication  GAD (generalized anxiety disorder) Stable and controlled off of meds  Vitamin D deficiency Updated lab needed at/ before next visit.   Migraine headache with aura Reports being headache free off of medication  PALPITATIONS Controlled, no change in medication

## 2016-09-10 DIAGNOSIS — Z3200 Encounter for pregnancy test, result unknown: Secondary | ICD-10-CM | POA: Diagnosis not present

## 2016-09-14 DIAGNOSIS — Z3200 Encounter for pregnancy test, result unknown: Secondary | ICD-10-CM | POA: Diagnosis not present

## 2016-09-16 ENCOUNTER — Ambulatory Visit (HOSPITAL_COMMUNITY): Payer: BLUE CROSS/BLUE SHIELD | Admitting: Oncology

## 2016-09-29 DIAGNOSIS — G43719 Chronic migraine without aura, intractable, without status migrainosus: Secondary | ICD-10-CM | POA: Diagnosis not present

## 2016-09-30 DIAGNOSIS — Z3A00 Weeks of gestation of pregnancy not specified: Secondary | ICD-10-CM | POA: Diagnosis not present

## 2016-09-30 DIAGNOSIS — O09811 Supervision of pregnancy resulting from assisted reproductive technology, first trimester: Secondary | ICD-10-CM | POA: Diagnosis not present

## 2016-10-13 ENCOUNTER — Encounter (HOSPITAL_COMMUNITY): Payer: BLUE CROSS/BLUE SHIELD | Attending: Oncology | Admitting: Oncology

## 2016-10-13 ENCOUNTER — Encounter (HOSPITAL_COMMUNITY): Payer: BLUE CROSS/BLUE SHIELD

## 2016-10-13 ENCOUNTER — Encounter (HOSPITAL_COMMUNITY): Payer: Self-pay | Admitting: Oncology

## 2016-10-13 VITALS — BP 110/49 | HR 81 | Temp 98.1°F | Resp 16 | Ht 62.0 in | Wt 197.0 lb

## 2016-10-13 DIAGNOSIS — D509 Iron deficiency anemia, unspecified: Secondary | ICD-10-CM

## 2016-10-13 DIAGNOSIS — D508 Other iron deficiency anemias: Secondary | ICD-10-CM | POA: Insufficient documentation

## 2016-10-13 LAB — FOLATE: Folate: 30.7 ng/mL (ref >5.9)

## 2016-10-13 LAB — CBC WITH DIFFERENTIAL/PLATELET
Basophils Absolute: 0 10*3/uL (ref 0.0–0.1)
Basophils Relative: 0 %
Eosinophils Absolute: 0.2 10*3/uL (ref 0.0–0.7)
Eosinophils Relative: 2 %
HCT: 37.5 % (ref 36.0–46.0)
Hemoglobin: 12.3 g/dL (ref 12.0–15.0)
Lymphocytes Relative: 19 %
Lymphs Abs: 1.9 10*3/uL (ref 0.7–4.0)
MCH: 30.1 pg (ref 26.0–34.0)
MCHC: 32.8 g/dL (ref 30.0–36.0)
MCV: 91.7 fL (ref 78.0–100.0)
Monocytes Absolute: 0.4 10*3/uL (ref 0.1–1.0)
Monocytes Relative: 4 %
Neutro Abs: 7.8 10*3/uL — ABNORMAL HIGH (ref 1.7–7.7)
Neutrophils Relative %: 75 %
Platelets: 293 10*3/uL (ref 150–400)
RBC: 4.09 MIL/uL (ref 3.87–5.11)
RDW: 12 % (ref 11.5–15.5)
WBC: 10.3 10*3/uL (ref 4.0–10.5)

## 2016-10-13 LAB — IRON AND TIBC
Iron: 57 ug/dL (ref 28–170)
Saturation Ratios: 15 % (ref 10.4–31.8)
TIBC: 384 ug/dL (ref 250–450)
UIBC: 327 ug/dL

## 2016-10-13 LAB — FERRITIN
Ferritin: 70 ng/mL (ref 11–307)
Ferritin: 71 ng/mL (ref 11–307)

## 2016-10-13 LAB — VITAMIN B12: Vitamin B-12: 181 pg/mL (ref 180–914)

## 2016-10-13 MED ORDER — FERROUS SULFATE 300 (60 FE) MG/5ML PO SYRP: 300.0000 mg | ORAL_SOLUTION | Freq: Two times a day (BID) | ORAL | 3 refills | Status: DC

## 2016-10-13 MED ORDER — HEPARIN SOD (PORK) LOCK FLUSH 100 UNIT/ML IV SOLN
INTRAVENOUS | Status: AC
  Filled 2016-10-13: qty 5

## 2016-10-13 NOTE — Assessment & Plan Note (Addendum)
Iron deficiency anemia. She has a history of gastric bypass surgery and irregular/heavy menses. She also has a history of B12 deficiency.  She takes a prenatal vitamin twice daily. She would like to try to have a child.  She has cancelled the following appointment since being seen last in Feb 2017: 06/03/2016, 07/14/2016, 08/03/2016, 08/13/2016, and 09/16/2016.  She is educated on our [employer].  Since was not previously informed, she is advised that three cancelled/no-showed appointments (including lab appointments) will result in discharge from the clinic.  She has been calling ahead of time to reschedule these appointment which has been helpful.  Labs today: CBC diff, ferritin.  I personally reviewed and went over laboratory results with the patient.  The results are noted within this dictation.  She is 8.[redacted] weeks pregnant.  She is seeing fertility clinic at [facility] (Dr. Freddie Apley).  She is advised that IV iron is contraindicated during her first trimester.  Labs every 6 weeks: CBC diff, ferritin.  Return in 3 months for follow-up.

## 2016-10-13 NOTE — Progress Notes (Signed)
Alejandra Nakayama, MD [address] [place] [address]  Other iron deficiency anemia - Plan: Vitamin B12, Folate, Iron and TIBC, Ferritin, Copper, serum  CURRENT THERAPY: IV iron replacement when indicated and ferrous sulfate syrup.  She WILL NOT get IV iron during her first trimester.  INTERVAL HISTORY: Alejandra Barr 30 y.o. female returns for followup of iron deficiency anemia. She has a history of gastric bypass surgery and irregular/heavy menses. She also has a history of B12 deficiency.  She takes a prenatal vitamin twice daily. She would like to try to have a child and she is currently 8.[redacted] weeks pregnant.  She is doing well.  She is tolerating ferrous sulfate.  She reports improved compliance with this medication.    She is interested more aggressive monitoring of her iron studies.  Review of Systems  Constitutional: Negative.  Negative for chills, fever and weight loss.  HENT: Negative.   Eyes: Negative.   Respiratory: Negative.   Cardiovascular: Negative.  Negative for chest pain.  Skin: Negative.  Negative for rash.  Neurological: Negative for weakness.    Past Medical History:  Diagnosis Date  . Asthma   . Atrial flutter ([place])    Status post RFA by Dr. Lovena Le in 2004  . Chronic anxiety   . Chronic depression   . 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 Iron deficiency   . Iron deficiency anemia 10/02/2010   Qualifier: Diagnosis of  By: Moshe Cipro MD, Joycelyn Schmid    . Obesity     Past Surgical History:  Procedure Laterality Date  . Bilateral eustachian tube placement on 4 different occasions    . CHOLECYSTECTOMY  2003  . COLONOSCOPY N/A 11/29/2013   Procedure: COLONOSCOPY;  Surgeon: Rogene Houston, MD;  Location: [facility];  Service: Endoscopy;  Laterality: N/A;  120-rescheduled to [address] notified pt  . GASTRIC BYPASS  2006  . TONSILLECTOMY  1990    Family History  Problem Relation Age of Onset  . Fibromyalgia Mother     Chronic  pelvic pain   . Breast cancer Mother   . Hypothyroidism Mother   . Obesity Mother   . Heart defect Mother     Supraventricular, tachycardia /tachypalpation     Social History   Social History  . Marital status: Married    Spouse name: N/A  . Number of children: N/A  . Years of education: N/A   Occupational History  . Nurse    .  [employer]   Social History Main Topics  . Smoking status: Former Smoker    Packs/day: 0.50    Types: Cigarettes    Start date: 03/21/2013    Quit date: 06/02/2015  . Smokeless tobacco: Never Used  . Alcohol use No  . Drug use: No  . Sexual activity: Yes    Birth control/ protection: None     Comment: husband had vasectomy   Other Topics Concern  . None   Social History Narrative  . None     PHYSICAL EXAMINATION  ECOG PERFORMANCE STATUS: 0 - Asymptomatic  Vitals:   10/13/16 1028  BP: (!) 110/49  Pulse: 81  Resp: 16  Temp: 98.1 F (36.7 C)    GENERAL:alert, well nourished, well developed, comfortable, cooperative, obese, smiling and unaccompanied SKIN: skin color, texture, turgor are normal, no rashes or significant lesions HEAD: Normocephalic, No masses, lesions, tenderness or abnormalities EYES: normal, EOMI, Conjunctiva are pink and non-injected  EARS: External ears normal OROPHARYNX:lips, buccal mucosa, and tongue normal and mucous membranes are moist  NECK: supple, trachea midline LYMPH:  no palpable lymphadenopathy BREAST:not examined LUNGS: clear to auscultation  HEART: regular rate & rhythm ABDOMEN:abdomen soft, obese and normal bowel sounds BACK: Back symmetric, no curvature. EXTREMITIES:less then 2 second capillary refill, no joint deformities, effusion, or inflammation, no skin discoloration, no cyanosis  NEURO: alert & oriented x 3 with fluent speech, no focal motor/sensory deficits, gait normal   LABORATORY DATA: CBC    Component Value Date/Time   WBC 10.8 (H) 06/01/2016 1158   RBC 4.34 06/01/2016 1158   HGB 13.3  06/01/2016 1158   HCT 39.9 06/01/2016 1158   PLT 332 06/01/2016 1158   MCV 91.9 06/01/2016 1158   MCH 30.6 06/01/2016 1158   MCHC 33.3 06/01/2016 1158   RDW 13.0 06/01/2016 1158   LYMPHSABS 3.2 06/01/2016 1158   MONOABS 0.6 06/01/2016 1158   EOSABS 0.5 06/01/2016 1158   BASOSABS 0.1 06/01/2016 1158      Chemistry      Component Value Date/Time   NA 140 04/19/2015 1100   K 4.4 04/19/2015 1100   CL 106 04/19/2015 1100   CO2 25 04/19/2015 1100   BUN 18 04/19/2015 1100   CREATININE 0.70 04/19/2015 1100   CREATININE 0.83 06/05/2014 1044      Component Value Date/Time   CALCIUM 9.2 04/19/2015 1100   ALKPHOS 90 04/19/2015 1100   AST 16 04/19/2015 1100   ALT 10 (L) 04/19/2015 1100   BILITOT 0.4 04/19/2015 1100     Lab Results  Component Value Date   IRON 80 12/05/2015   TIBC 308 12/05/2015   FERRITIN 220 06/01/2016    PENDING LABS:   RADIOGRAPHIC STUDIES:  No results found.   PATHOLOGY:    ASSESSMENT AND PLAN:  Iron deficiency anemia Iron deficiency anemia. She has a history of gastric bypass surgery and irregular/heavy menses. She also has a history of B12 deficiency.  She takes a prenatal vitamin twice daily. She would like to try to have a child.  She has cancelled the following appointment since being seen last in Feb 2017: 06/03/2016, 07/14/2016, 08/03/2016, 08/13/2016, and 09/16/2016.  She is educated on our Financial trader.  Since was not previously informed, she is advised that three cancelled/no-showed appointments (including lab appointments) will result in discharge from the clinic.  She has been calling ahead of time to reschedule these appointment which has been helpful.  Labs today: CBC diff, ferritin.  I personally reviewed and went over laboratory results with the patient.  The results are noted within this dictation.  She is 8.[redacted] weeks pregnant.  She is seeing fertility clinic at [facility] (Dr. Freddie Apley).  She is advised that IV iron is contraindicated  during her first trimester.  Labs every 6 weeks: CBC diff, ferritin.  Return in 3 months for follow-up.   ORDERS PLACED FOR THIS ENCOUNTER: Orders Placed This Encounter  Procedures  . Vitamin B12  . Folate  . Iron and TIBC  . Ferritin  . Copper, serum    MEDICATIONS PRESCRIBED THIS ENCOUNTER: Meds ordered this encounter  Medications  . progesterone 50 MG/ML injection    Refill:  1  . ferrous sulfate 300 (60 Fe) MG/5ML syrup    Sig: Take 5 mLs (300 mg total) by mouth 2 (two) times daily with a meal. Mix in 4oz of orange juice    Dispense:  150 mL    Refill:  3  Order Specific Question:   Supervising Provider    Answer:   Patrici Ranks NI:[id number]    THERAPY PLAN:  Ongoing observation of iron studies and PO iron replacement.  All questions were answered. The patient knows to call the clinic with any problems, questions or concerns. We can certainly see the patient much sooner if necessary.  Patient and plan discussed with Dr. Ancil Linsey and she is in agreement with the aforementioned.   This note is electronically signed by: Doy Mince 10/13/2016 11:35 AM

## 2016-10-13 NOTE — Patient Instructions (Addendum)
[place] at [facility] Discharge Instructions  RECOMMENDATIONS MADE BY THE CONSULTANT AND ANY TEST RESULTS WILL BE SENT TO YOUR REFERRING PHYSICIAN.  You were seen today by Kirby Crigler PA-C. Refill of liquid iron sent to your pharmacy. Labs today, will call with results. Return in 6 weeks for labs. Return in 3 months for follow up.   Thank you for choosing [place] at [facility] to provide your oncology and hematology care.  To afford each patient quality time with our provider, please arrive at least 15 minutes before your scheduled appointment time.   Beginning January 23rd 2017 lab work for the [facility] will be done in the  Main lab at [facility] on 1st floor. If you have a lab appointment with the [place] please come in thru the  Main Entrance and check in at the main information desk  You need to re-schedule your appointment should you arrive 10 or more minutes late.  We strive to give you quality time with our providers, and arriving late affects you and other patients whose appointments are after yours.  Also, if you no show three or more times for appointments you may be dismissed from the clinic at the providers discretion.     Again, thank you for choosing [facility].  Our hope is that these requests will decrease the amount of time that you wait before being seen by our physicians.       _____________________________________________________________  Should you have questions after your visit to [facility], please contact our office at [phone] between the hours of 8:30 a.m. and 4:30 p.m.  Voicemails left after 4:30 p.m. will not be returned until the following business day.  For prescription refill requests, have your pharmacy contact our office.         Resources For Cancer Patients and their Caregivers ? American Cancer Society: Can assist with transportation, wigs, general  needs, runs Look Good Feel Better.        (804)653-7641 ? Cancer Care: Provides financial assistance, online support groups, medication/co-pay assistance.  1-800-813-HOPE (718)596-0626) ? [place] Assists [place] cancer patients and their families through emotional , educational and financial support.  623-748-5274 ? [place] DSS Where to apply for food stamps, Medicaid and utility assistance. [phone] ? RCATS: Transportation to medical appointments. 628-304-7976 ? Social Security Administration: May apply for disability if have a Stage IV cancer. ([phone] 503-686-2894 ? [employer], Disability and Transit Services: Assists with nutrition, care and transit needs. [place] Support Programs: @10RELATIVEDAYS @ > Cancer Support Group  2nd Tuesday of the month 1pm-2pm, Journey Room  > Creative Journey  3rd Tuesday of the month 1130am-1pm, Journey Room  > Look Good Feel Better  1st Wednesday of the month 10am-12 noon, Journey Room (Call [place] to register [phone])

## 2016-10-14 ENCOUNTER — Other Ambulatory Visit (HOSPITAL_COMMUNITY): Payer: Self-pay | Admitting: Oncology

## 2016-10-15 DIAGNOSIS — Z3A00 Weeks of gestation of pregnancy not specified: Secondary | ICD-10-CM | POA: Diagnosis not present

## 2016-10-15 DIAGNOSIS — O09811 Supervision of pregnancy resulting from assisted reproductive technology, first trimester: Secondary | ICD-10-CM | POA: Diagnosis not present

## 2016-10-15 LAB — COPPER, SERUM: Copper: 198 ug/dL — ABNORMAL HIGH (ref 72–166)

## 2016-10-16 ENCOUNTER — Encounter (HOSPITAL_COMMUNITY): Payer: Self-pay

## 2016-10-16 ENCOUNTER — Encounter (HOSPITAL_BASED_OUTPATIENT_CLINIC_OR_DEPARTMENT_OTHER): Payer: BLUE CROSS/BLUE SHIELD

## 2016-10-16 VITALS — BP 96/64 | HR 87 | Temp 98.7°F | Resp 18

## 2016-10-16 DIAGNOSIS — E538 Deficiency of other specified B group vitamins: Secondary | ICD-10-CM

## 2016-10-16 MED ORDER — CYANOCOBALAMIN 1000 MCG/ML IJ SOLN
1000.0000 ug | Freq: Once | INTRAMUSCULAR | Status: AC
  Administered 2016-10-16: 1000 ug via INTRAMUSCULAR

## 2016-10-16 MED ORDER — CYANOCOBALAMIN 1000 MCG/ML IJ SOLN
INTRAMUSCULAR | Status: AC
  Filled 2016-10-16: qty 1

## 2016-10-16 NOTE — Patient Instructions (Signed)
[facility] at [facility] Discharge Instructions  RECOMMENDATIONS MADE BY THE CONSULTANT AND ANY TEST RESULTS WILL BE SENT TO YOUR REFERRING PHYSICIAN.  B12   Thank you for choosing [facility] at [facility] to provide your oncology and hematology care.  To afford each patient quality time with our provider, please arrive at least 15 minutes before your scheduled appointment time.   Beginning January 23rd 2017 lab work for the Cancer Center will be done in the  Main lab at [facility] on 1st floor. If you have a lab appointment with the Cancer Center please come in thru the  Main Entrance and check in at the main information desk  You need to re-schedule your appointment should you arrive 10 or more minutes late.  We strive to give you quality time with our providers, and arriving late affects you and other patients whose appointments are after yours.  Also, if you no show three or more times for appointments you may be dismissed from the clinic at the providers discretion.     Again, thank you for choosing [facility].  Our hope is that these requests will decrease the amount of time that you wait before being seen by our physicians.       _____________________________________________________________  Should you have questions after your visit to  Cancer Center, please contact our office at [phone] between the hours of 8:30 a.m. and 4:30 p.m.  Voicemails left after 4:30 p.m. will not be returned until the following business day.  For prescription refill requests, have your pharmacy contact our office.         Resources For Cancer Patients and their Caregivers ? American Cancer Society: Can assist with transportation, wigs, general needs, runs Look Good Feel Better.        1-888-227-6333 ? Cancer Care: Provides financial assistance, online support groups, medication/co-pay assistance.  1-800-813-HOPE  (4673) ? [facility] Assists [place] cancer patients and their families through emotional , educational and financial support.  336-427-4357 ? [place] DSS Where to apply for food stamps, Medicaid and utility assistance. 336-342-1394 ? RCATS: Transportation to medical appointments. 336-347-2287 ? Social Security Administration: May apply for disability if have a Stage IV cancer. 336-342-7796 1-800-772-1213 ? [place] Aging, Disability and Transit Services: Assists with nutrition, care and transit needs. 336-349-2343  Cancer Center Support Programs: @10RELATIVEDAYS@ > Cancer Support Group  2nd Tuesday of the month 1pm-2pm, Journey Room  > Creative Journey  3rd Tuesday of the month 1130am-1pm, Journey Room  > Look Good Feel Better  1st Wednesday of the month 10am-12 noon, Journey Room (Call American Cancer Society to register [phone])    

## 2016-10-16 NOTE — Progress Notes (Signed)
Alejandra Barr presents today for injection per MD orders. B12 1025mcg administered IM in left Upper Arm. Administration without incident. Patient tolerated well.

## 2016-10-21 DIAGNOSIS — Z3685 Encounter for antenatal screening for Streptococcus B: Secondary | ICD-10-CM | POA: Diagnosis not present

## 2016-10-21 DIAGNOSIS — Z3401 Encounter for supervision of normal first pregnancy, first trimester: Secondary | ICD-10-CM | POA: Diagnosis not present

## 2016-10-21 LAB — OB RESULTS CONSOLE RUBELLA ANTIBODY, IGM: Rubella: IMMUNE

## 2016-10-21 LAB — OB RESULTS CONSOLE ABO/RH: RH Type: POSITIVE

## 2016-10-21 LAB — OB RESULTS CONSOLE HIV ANTIBODY (ROUTINE TESTING): HIV: NONREACTIVE

## 2016-10-21 LAB — OB RESULTS CONSOLE HEPATITIS B SURFACE ANTIGEN: Hepatitis B Surface Ag: NEGATIVE

## 2016-10-21 LAB — OB RESULTS CONSOLE GC/CHLAMYDIA
Chlamydia: NEGATIVE
Gonorrhea: NEGATIVE

## 2016-10-21 LAB — OB RESULTS CONSOLE ANTIBODY SCREEN: Antibody Screen: NEGATIVE

## 2016-10-21 LAB — OB RESULTS CONSOLE RPR: RPR: NONREACTIVE

## 2016-10-22 DIAGNOSIS — G43719 Chronic migraine without aura, intractable, without status migrainosus: Secondary | ICD-10-CM | POA: Diagnosis not present

## 2016-10-23 ENCOUNTER — Encounter (HOSPITAL_BASED_OUTPATIENT_CLINIC_OR_DEPARTMENT_OTHER): Payer: BLUE CROSS/BLUE SHIELD

## 2016-10-23 ENCOUNTER — Encounter (HOSPITAL_COMMUNITY): Payer: Self-pay

## 2016-10-23 VITALS — BP 110/42 | HR 75 | Temp 98.1°F | Resp 16

## 2016-10-23 DIAGNOSIS — E538 Deficiency of other specified B group vitamins: Secondary | ICD-10-CM

## 2016-10-23 MED ORDER — CYANOCOBALAMIN 1000 MCG/ML IJ SOLN
1000.0000 ug | Freq: Once | INTRAMUSCULAR | Status: AC
  Administered 2016-10-23: 1000 ug via INTRAMUSCULAR

## 2016-10-23 MED ORDER — CYANOCOBALAMIN 1000 MCG/ML IJ SOLN
INTRAMUSCULAR | Status: AC
  Filled 2016-10-23: qty 1

## 2016-10-23 NOTE — Progress Notes (Signed)
Pt given B12 injection today. Pt tolerated well. Pt stable and discharged home ambulatory.  

## 2016-10-23 NOTE — Patient Instructions (Signed)
[place] at [facility] Discharge Instructions  RECOMMENDATIONS MADE BY THE CONSULTANT AND ANY TEST RESULTS WILL BE SENT TO YOUR REFERRING PHYSICIAN.  You were given a B12 injection today. Return to clinic as scheduled.   Thank you for choosing [place] at [facility] to provide your oncology and hematology care.  To afford each patient quality time with our provider, please arrive at least 15 minutes before your scheduled appointment time.   Beginning January 23rd 2017 lab work for the [facility] will be done in the  Main lab at [facility] on 1st floor. If you have a lab appointment with the [place] please come in thru the  Main Entrance and check in at the main information desk  You need to re-schedule your appointment should you arrive 10 or more minutes late.  We strive to give you quality time with our providers, and arriving late affects you and other patients whose appointments are after yours.  Also, if you no show three or more times for appointments you may be dismissed from the clinic at the providers discretion.     Again, thank you for choosing [facility].  Our hope is that these requests will decrease the amount of time that you wait before being seen by our physicians.       _____________________________________________________________  Should you have questions after your visit to [facility], please contact our office at [phone] between the hours of 8:30 a.m. and 4:30 p.m.  Voicemails left after 4:30 p.m. will not be returned until the following business day.  For prescription refill requests, have your pharmacy contact our office.         Resources For Cancer Patients and their Caregivers ? American Cancer Society: Can assist with transportation, wigs, general needs, runs Look Good Feel Better.        (507)641-1060 ? Cancer Care: Provides financial assistance, online support  groups, medication/co-pay assistance.  1-800-813-HOPE (315)325-5760) ? [place] Assists [place] cancer patients and their families through emotional , educational and financial support.  (409) 848-0541 ? [place] DSS Where to apply for food stamps, Medicaid and utility assistance. 367-744-5969 ? RCATS: Transportation to medical appointments. 2542680086 ? Social Security Administration: May apply for disability if have a Stage IV cancer. 541-753-1217 319-204-3736 ? [employer], Disability and Transit Services: Assists with nutrition, care and transit needs. [place] Support Programs: @10RELATIVEDAYS @ > Cancer Support Group  2nd Tuesday of the month 1pm-2pm, Journey Room  > Creative Journey  3rd Tuesday of the month 1130am-1pm, Journey Room  > Look Good Feel Better  1st Wednesday of the month 10am-12 noon, Journey Room (Call [place] to register [phone])

## 2016-10-30 ENCOUNTER — Encounter (HOSPITAL_BASED_OUTPATIENT_CLINIC_OR_DEPARTMENT_OTHER): Payer: BLUE CROSS/BLUE SHIELD

## 2016-10-30 VITALS — BP 113/67 | HR 95 | Temp 99.1°F | Resp 18

## 2016-10-30 DIAGNOSIS — E538 Deficiency of other specified B group vitamins: Secondary | ICD-10-CM | POA: Diagnosis not present

## 2016-10-30 MED ORDER — CYANOCOBALAMIN 1000 MCG/ML IJ SOLN
1000.0000 ug | Freq: Once | INTRAMUSCULAR | Status: AC
  Administered 2016-10-30: 1000 ug via INTRAMUSCULAR

## 2016-10-30 MED ORDER — CYANOCOBALAMIN 1000 MCG/ML IJ SOLN
INTRAMUSCULAR | Status: AC
  Filled 2016-10-30: qty 1

## 2016-10-30 NOTE — Progress Notes (Signed)
Alejandra Barr presents today for injection per MD orders. B12 1054mcg administered IM in left Upper Arm. Administration without incident. Patient tolerated well.

## 2016-11-04 DIAGNOSIS — Z3480 Encounter for supervision of other normal pregnancy, unspecified trimester: Secondary | ICD-10-CM | POA: Diagnosis not present

## 2016-11-04 DIAGNOSIS — Z36 Encounter for antenatal screening for chromosomal anomalies: Secondary | ICD-10-CM | POA: Diagnosis not present

## 2016-11-04 DIAGNOSIS — Z3401 Encounter for supervision of normal first pregnancy, first trimester: Secondary | ICD-10-CM | POA: Diagnosis not present

## 2016-11-05 ENCOUNTER — Encounter (HOSPITAL_COMMUNITY): Payer: BLUE CROSS/BLUE SHIELD | Attending: Oncology

## 2016-11-05 ENCOUNTER — Encounter (HOSPITAL_COMMUNITY): Payer: Self-pay

## 2016-11-05 VITALS — BP 117/66 | HR 79 | Resp 16

## 2016-11-05 DIAGNOSIS — D508 Other iron deficiency anemias: Secondary | ICD-10-CM | POA: Insufficient documentation

## 2016-11-05 DIAGNOSIS — E538 Deficiency of other specified B group vitamins: Secondary | ICD-10-CM | POA: Diagnosis not present

## 2016-11-05 MED ORDER — CYANOCOBALAMIN 1000 MCG/ML IJ SOLN
1000.0000 ug | Freq: Once | INTRAMUSCULAR | Status: AC
  Administered 2016-11-05: 1000 ug via INTRAMUSCULAR

## 2016-11-05 MED ORDER — CYANOCOBALAMIN 1000 MCG/ML IJ SOLN
INTRAMUSCULAR | Status: AC
  Filled 2016-11-05: qty 1

## 2016-11-05 NOTE — Patient Instructions (Signed)
[facility] at [facility] Discharge Instructions  RECOMMENDATIONS MADE BY THE CONSULTANT AND ANY TEST RESULTS WILL BE SENT TO YOUR REFERRING PHYSICIAN.  B12 injection given Follow up as scheduled.  Thank you for choosing [facility] at [facility] to provide your oncology and hematology care.  To afford each patient quality time with our provider, please arrive at least 15 minutes before your scheduled appointment time.    If you have a lab appointment with the Cancer Center please come in thru the  Main Entrance and check in at the main information desk  You need to re-schedule your appointment should you arrive 10 or more minutes late.  We strive to give you quality time with our providers, and arriving late affects you and other patients whose appointments are after yours.  Also, if you no show three or more times for appointments you may be dismissed from the clinic at the providers discretion.     Again, thank you for choosing [facility].  Our hope is that these requests will decrease the amount of time that you wait before being seen by our physicians.       _____________________________________________________________  Should you have questions after your visit to [facility], please contact our office at [phone] between the hours of 8:30 a.m. and 4:30 p.m.  Voicemails left after 4:30 p.m. will not be returned until the following business day.  For prescription refill requests, have your pharmacy contact our office.       Resources For Cancer Patients and their Caregivers ? American Cancer Society: Can assist with transportation, wigs, general needs, runs Look Good Feel Better.        1-888-227-6333 ? Cancer Care: Provides financial assistance, online support groups, medication/co-pay assistance.  1-800-813-HOPE (4673) ? [facility] Assists [place] cancer patients and  their families through emotional , educational and financial support.  336-427-4357 ? [place] DSS Where to apply for food stamps, Medicaid and utility assistance. 336-342-1394 ? RCATS: Transportation to medical appointments. 336-347-2287 ? Social Security Administration: May apply for disability if have a Stage IV cancer. 336-342-7796 1-800-772-1213 ? [place] Aging, Disability and Transit Services: Assists with nutrition, care and transit needs. 336-349-2343  Cancer Center Support Programs: @10RELATIVEDAYS@ > Cancer Support Group  2nd Tuesday of the month 1pm-2pm, Journey Room  > Creative Journey  3rd Tuesday of the month 1130am-1pm, Journey Room  > Look Good Feel Better  1st Wednesday of the month 10am-12 noon, Journey Room (Call American [employer] to register [phone])   

## 2016-11-05 NOTE — Progress Notes (Signed)
Alejandra Barr presents today for injection per MD orders. B12 1,071mcg administered IM in left Upper Arm. Administration without incident. Patient tolerated well. Vitals stable and discharged from clinic ambulatory. Follow up as scheduled.

## 2016-11-12 DIAGNOSIS — Z3682 Encounter for antenatal screening for nuchal translucency: Secondary | ICD-10-CM | POA: Diagnosis not present

## 2016-11-12 DIAGNOSIS — Z36 Encounter for antenatal screening for chromosomal anomalies: Secondary | ICD-10-CM | POA: Diagnosis not present

## 2016-11-12 DIAGNOSIS — Z3491 Encounter for supervision of normal pregnancy, unspecified, first trimester: Secondary | ICD-10-CM | POA: Diagnosis not present

## 2016-11-18 ENCOUNTER — Encounter: Payer: Self-pay | Admitting: Advanced Practice Midwife

## 2016-11-24 ENCOUNTER — Encounter (HOSPITAL_COMMUNITY): Payer: BLUE CROSS/BLUE SHIELD

## 2016-11-24 DIAGNOSIS — D509 Iron deficiency anemia, unspecified: Secondary | ICD-10-CM

## 2016-11-24 DIAGNOSIS — D508 Other iron deficiency anemias: Secondary | ICD-10-CM | POA: Diagnosis not present

## 2016-11-24 LAB — CBC WITH DIFFERENTIAL/PLATELET
Basophils Absolute: 0 10*3/uL (ref 0.0–0.1)
Basophils Relative: 0 %
Eosinophils Absolute: 0.3 10*3/uL (ref 0.0–0.7)
Eosinophils Relative: 2 %
HCT: 39.4 % (ref 36.0–46.0)
Hemoglobin: 13.5 g/dL (ref 12.0–15.0)
Lymphocytes Relative: 17 %
Lymphs Abs: 2.7 10*3/uL (ref 0.7–4.0)
MCH: 31 pg (ref 26.0–34.0)
MCHC: 34.3 g/dL (ref 30.0–36.0)
MCV: 90.6 fL (ref 78.0–100.0)
Monocytes Absolute: 0.8 10*3/uL (ref 0.1–1.0)
Monocytes Relative: 5 %
Neutro Abs: 12.2 10*3/uL — ABNORMAL HIGH (ref 1.7–7.7)
Neutrophils Relative %: 76 %
Platelets: 281 10*3/uL (ref 150–400)
RBC: 4.35 MIL/uL (ref 3.87–5.11)
RDW: 13.3 % (ref 11.5–15.5)
WBC: 16 10*3/uL — ABNORMAL HIGH (ref 4.0–10.5)

## 2016-11-24 LAB — FERRITIN: Ferritin: 40 ng/mL (ref 11–307)

## 2016-12-01 ENCOUNTER — Telehealth (HOSPITAL_COMMUNITY): Payer: Self-pay | Admitting: *Deleted

## 2016-12-01 NOTE — Telephone Encounter (Signed)
Not until I have confirmed documentation regarding her trimester from her obstetrician.  I have not received any communication from Dr. Berle Mull and I cannot gather the information on my review from [facility].  Also, I think she needs to discuss her iron needs with OB for their opinion as well.  Robynn Pane, PA-C 12/01/2016 3:48 PM

## 2016-12-07 ENCOUNTER — Ambulatory Visit (HOSPITAL_COMMUNITY): Payer: BLUE CROSS/BLUE SHIELD

## 2016-12-09 ENCOUNTER — Telehealth (HOSPITAL_COMMUNITY): Payer: Self-pay | Admitting: Oncology

## 2016-12-09 NOTE — Telephone Encounter (Signed)
I received a letter from Dr. Linda Hedges (OB).  It is dated 12/03/2016.  Letter states that patient is 8 weeks' gestational age.  Therefore, she is a candidate for ongoing IV iron replacement therapy when indicated to maintain a ferritin of 100.  Lilybeth Vien, PA-C 12/09/2016 8:06 AM

## 2016-12-11 ENCOUNTER — Encounter (HOSPITAL_COMMUNITY): Payer: Self-pay | Admitting: Oncology

## 2016-12-11 ENCOUNTER — Other Ambulatory Visit (HOSPITAL_COMMUNITY): Payer: Self-pay | Admitting: Oncology

## 2016-12-16 ENCOUNTER — Encounter (HOSPITAL_COMMUNITY): Payer: BLUE CROSS/BLUE SHIELD | Attending: Oncology

## 2016-12-16 VITALS — BP 100/62 | HR 82 | Temp 98.9°F | Resp 18

## 2016-12-16 DIAGNOSIS — D509 Iron deficiency anemia, unspecified: Secondary | ICD-10-CM

## 2016-12-16 DIAGNOSIS — O99111 Other diseases of the blood and blood-forming organs and certain disorders involving the immune mechanism complicating pregnancy, first trimester: Secondary | ICD-10-CM

## 2016-12-16 DIAGNOSIS — E538 Deficiency of other specified B group vitamins: Secondary | ICD-10-CM | POA: Diagnosis not present

## 2016-12-16 MED ORDER — SODIUM CHLORIDE 0.9 % IV SOLN
125.0000 mg | Freq: Once | INTRAVENOUS | Status: AC
  Administered 2016-12-16: 125 mg via INTRAVENOUS
  Filled 2016-12-16: qty 10

## 2016-12-16 MED ORDER — CYANOCOBALAMIN 1000 MCG/ML IJ SOLN
1000.0000 ug | Freq: Once | INTRAMUSCULAR | Status: AC
  Administered 2016-12-16: 1000 ug via INTRAMUSCULAR
  Filled 2016-12-16: qty 1

## 2016-12-16 MED ORDER — SODIUM CHLORIDE 0.9 % IV SOLN
Freq: Once | INTRAVENOUS | Status: AC
  Administered 2016-12-16: 14:00:00 via INTRAVENOUS

## 2016-12-16 NOTE — Patient Instructions (Addendum)
[place] at [facility] Discharge Instructions  RECOMMENDATIONS MADE BY THE CONSULTANT AND ANY TEST RESULTS WILL BE SENT TO YOUR REFERRING PHYSICIAN.  Received Ferric Gluconate infusion and Vit B12 injection today. Follow-up as scheduled. Call clinic for any questions or concerns  Thank you for choosing [place] at [facility] to provide your oncology and hematology care.  To afford each patient quality time with our provider, please arrive at least 15 minutes before your scheduled appointment time.    If you have a lab appointment with the [facility] please come in thru the  Main Entrance and check in at the main information desk  You need to re-schedule your appointment should you arrive 10 or more minutes late.  We strive to give you quality time with our providers, and arriving late affects you and other patients whose appointments are after yours.  Also, if you no show three or more times for appointments you may be dismissed from the clinic at the providers discretion.     Again, thank you for choosing [facility].  Our hope is that these requests will decrease the amount of time that you wait before being seen by our physicians.       _____________________________________________________________  Should you have questions after your visit to [facility], please contact our office at [phone] between the hours of 8:30 a.m. and 4:30 p.m.  Voicemails left after 4:30 p.m. will not be returned until the following business day.  For prescription refill requests, have your pharmacy contact our office.       Resources For Cancer Patients and their Caregivers ? American Cancer Society: Can assist with transportation, wigs, general needs, runs Look Good Feel Better.        ([phone] ? Cancer Care: Provides financial assistance, online support groups, medication/co-pay assistance.  1-800-813-HOPE  534 859 3823) ? [place] Assists [place] cancer patients and their families through emotional , educational and financial support.  912-389-7085 ? [place] DSS Where to apply for food stamps, Medicaid and utility assistance. 307-156-9568 ? RCATS: Transportation to medical appointments. 870-304-7423 ? Social Security Administration: May apply for disability if have a Stage IV cancer. (501)123-6764 828-882-3315 ? [employer], Disability and Transit Services: Assists with nutrition, care and transit needs. [place] Support Programs: @10RELATIVEDAYS @ > Cancer Support Group  2nd Tuesday of the month 1pm-2pm, Journey Room  > Creative Journey  3rd Tuesday of the month 1130am-1pm, Journey Room  > Look Good Feel Better  1st Wednesday of the month 10am-12 noon, Journey Room (Call [place] to register [phone])

## 2016-12-16 NOTE — Progress Notes (Signed)
Alejandra Barr tolerated Ferric Gluconate infusion and Vit B12 injection well without complaints or incident. VSS upon discharge. Pt discharged self ambulatory in satisfactory condition

## 2016-12-24 DIAGNOSIS — Z3480 Encounter for supervision of other normal pregnancy, unspecified trimester: Secondary | ICD-10-CM | POA: Diagnosis not present

## 2016-12-24 DIAGNOSIS — Z363 Encounter for antenatal screening for malformations: Secondary | ICD-10-CM | POA: Diagnosis not present

## 2016-12-24 DIAGNOSIS — Z3A18 18 weeks gestation of pregnancy: Secondary | ICD-10-CM | POA: Diagnosis not present

## 2016-12-28 ENCOUNTER — Other Ambulatory Visit (HOSPITAL_COMMUNITY): Payer: Self-pay | Admitting: Hematology & Oncology

## 2016-12-28 DIAGNOSIS — D508 Other iron deficiency anemias: Secondary | ICD-10-CM

## 2016-12-28 NOTE — Telephone Encounter (Signed)
Received refill request from patients pharmacy for liquid Iron. Reviewed with PA-C, chart checked and refilled.

## 2017-01-04 ENCOUNTER — Other Ambulatory Visit (HOSPITAL_COMMUNITY): Payer: Self-pay | Admitting: *Deleted

## 2017-01-04 DIAGNOSIS — D508 Other iron deficiency anemias: Secondary | ICD-10-CM

## 2017-01-05 ENCOUNTER — Other Ambulatory Visit (HOSPITAL_COMMUNITY): Payer: Self-pay | Admitting: Oncology

## 2017-01-05 ENCOUNTER — Encounter (HOSPITAL_COMMUNITY): Payer: BLUE CROSS/BLUE SHIELD

## 2017-01-05 ENCOUNTER — Encounter (HOSPITAL_COMMUNITY): Payer: Self-pay

## 2017-01-05 ENCOUNTER — Encounter (HOSPITAL_COMMUNITY): Payer: BLUE CROSS/BLUE SHIELD | Attending: Oncology | Admitting: Oncology

## 2017-01-05 VITALS — BP 112/51 | HR 88 | Temp 98.6°F | Resp 16 | Wt 215.0 lb

## 2017-01-05 DIAGNOSIS — D508 Other iron deficiency anemias: Secondary | ICD-10-CM | POA: Diagnosis not present

## 2017-01-05 DIAGNOSIS — D509 Iron deficiency anemia, unspecified: Secondary | ICD-10-CM | POA: Diagnosis not present

## 2017-01-05 DIAGNOSIS — O99112 Other diseases of the blood and blood-forming organs and certain disorders involving the immune mechanism complicating pregnancy, second trimester: Secondary | ICD-10-CM

## 2017-01-05 DIAGNOSIS — E538 Deficiency of other specified B group vitamins: Secondary | ICD-10-CM

## 2017-01-05 LAB — BASIC METABOLIC PANEL
Anion gap: 6 (ref 5–15)
BUN: 16 mg/dL (ref 6–20)
CO2: 23 mmol/L (ref 22–32)
Calcium: 8.8 mg/dL — ABNORMAL LOW (ref 8.9–10.3)
Chloride: 107 mmol/L (ref 101–111)
Creatinine, Ser: 0.59 mg/dL (ref 0.44–1.00)
GFR calc Af Amer: >60 mL/min (ref >60)
GFR calc non Af Amer: >60 mL/min (ref >60)
Glucose, Bld: 90 mg/dL (ref 65–99)
Potassium: 3.9 mmol/L (ref 3.5–5.1)
Sodium: 136 mmol/L (ref 135–145)

## 2017-01-05 LAB — CBC WITH DIFFERENTIAL/PLATELET
Basophils Absolute: 0 10*3/uL (ref 0.0–0.1)
Basophils Relative: 0 %
Eosinophils Absolute: 0.2 10*3/uL (ref 0.0–0.7)
Eosinophils Relative: 2 %
HCT: 37.6 % (ref 36.0–46.0)
Hemoglobin: 12.5 g/dL (ref 12.0–15.0)
Lymphocytes Relative: 16 %
Lymphs Abs: 2 10*3/uL (ref 0.7–4.0)
MCH: 30.9 pg (ref 26.0–34.0)
MCHC: 33.2 g/dL (ref 30.0–36.0)
MCV: 93.1 fL (ref 78.0–100.0)
Monocytes Absolute: 0.5 10*3/uL (ref 0.1–1.0)
Monocytes Relative: 4 %
Neutro Abs: 10 10*3/uL — ABNORMAL HIGH (ref 1.7–7.7)
Neutrophils Relative %: 78 %
Platelets: 241 10*3/uL (ref 150–400)
RBC: 4.04 MIL/uL (ref 3.87–5.11)
RDW: 13.7 % (ref 11.5–15.5)
WBC: 12.7 10*3/uL — ABNORMAL HIGH (ref 4.0–10.5)

## 2017-01-05 LAB — IRON AND TIBC
Iron: 94 ug/dL (ref 28–170)
Saturation Ratios: 23 % (ref 10.4–31.8)
TIBC: 405 ug/dL (ref 250–450)
UIBC: 311 ug/dL

## 2017-01-05 LAB — FERRITIN: Ferritin: 44 ng/mL (ref 11–307)

## 2017-01-05 NOTE — Patient Instructions (Signed)
[facility] at [facility] Discharge Instructions  RECOMMENDATIONS MADE BY THE CONSULTANT AND ANY TEST RESULTS WILL BE SENT TO YOUR REFERRING PHYSICIAN.  You were seen today by Dr. Louise Zhou Follow up in 6 weeks with lab work See Amy up front for appointments   Thank you for choosing [facility] at [facility] to provide your oncology and hematology care.  To afford each patient quality time with our provider, please arrive at least 15 minutes before your scheduled appointment time.    If you have a lab appointment with the Cancer Center please come in thru the  Main Entrance and check in at the main information desk  You need to re-schedule your appointment should you arrive 10 or more minutes late.  We strive to give you quality time with our providers, and arriving late affects you and other patients whose appointments are after yours.  Also, if you no show three or more times for appointments you may be dismissed from the clinic at the providers discretion.     Again, thank you for choosing [facility].  Our hope is that these requests will decrease the amount of time that you wait before being seen by our physicians.       _____________________________________________________________  Should you have questions after your visit to [facility], please contact our office at [phone] between the hours of 8:30 a.m. and 4:30 p.m.  Voicemails left after 4:30 p.m. will not be returned until the following business day.  For prescription refill requests, have your pharmacy contact our office.       Resources For Cancer Patients and their Caregivers ? American Cancer Society: Can assist with transportation, wigs, general needs, runs Look Good Feel Better.        1-888-227-6333 ? Cancer Care: Provides financial assistance, online support groups, medication/co-pay assistance.  1-800-813-HOPE (4673) ? [facility] Assists [place] cancer patients and their families through emotional , educational and financial support.  336-427-4357 ? [place] DSS Where to apply for food stamps, Medicaid and utility assistance. 336-342-1394 ? RCATS: Transportation to medical appointments. 336-347-2287 ? Social Security Administration: May apply for disability if have a Stage IV cancer. 336-342-7796 1-800-772-1213 ? [place] Aging, Disability and Transit Services: Assists with nutrition, care and transit needs. 336-349-2343  Cancer Center Support Programs: @10RELATIVEDAYS@ > Cancer Support Group  2nd Tuesday of the month 1pm-2pm, Journey Room  > Creative Journey  3rd Tuesday of the month 1130am-1pm, Journey Room  > Look Good Feel Better  1st Wednesday of the month 10am-12 noon, Journey Room (Call American Cancer Society to register [phone])    

## 2017-01-05 NOTE — Progress Notes (Signed)
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 female returns for routine follow-up of iron deficiency anemia. She has a history of gastric bypass surgery and irregular/heavy menses. She also has a history of B12 deficiency.  She takes a prenatal vitamin twice daily. She would like to try to have a child.  Alejandra Barr presents to the clinic unaccompanied for continued follow up. I personally reviewed and went over labs with the patient.  She state she will be [redacted] weeks pregnant in 4 days. The due date is on May 21, 2017.   She notes she working full time currently for [employer] from home.  She states she feels better since she got Nulecit for IV iron replacement on 12/16/16.  She notes she is on prenatal vitamin with iron 2x a day.   She denies any abdominal pain, fatigue, N/V, chest pain, SOB, focal weakness.   Past Medical History:  Diagnosis Date  . Asthma   . Atrial flutter ([place])    Status post RFA by Dr. Lovena Le in 2004  . Chronic anxiety   . Chronic depression   . 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 Iron deficiency   . Iron deficiency anemia 10/02/2010   Qualifier: Diagnosis of  By: Moshe Cipro MD, Joycelyn Schmid    . Obesity     has Morbid obesity ([place]); Iron deficiency anemia; BIPOLAR II DISORDER; ANXIETY, CHRONIC; CLUSTER HEADACHE; Atrial flutter ([place]); Asthma, mild intermittent, well-controlled; Palpitations; Depression; Vitamin D deficiency; Insomnia; GAD (generalized anxiety disorder); Low vitamin B12 level; Abnormal antibody titer--Strongyloides; Abdominal pain, other specified site; Migraine headache with aura; Encounter for genetic counseling and testing; Encounter for preconception consultation; Anemia;  Asthma; Eosinophil count raised; Iron deficiency; Prurigo nodularis; and Asthma, well controlled on her problem list.     No history exists.     is allergic to ciprofloxacin; morphine; morphine and related; and penicillins.  Alejandra Barr had no medications administered during this visit.  Past Surgical History:  Procedure Laterality Date  . Bilateral eustachian tube placement on 4 different occasions    . CHOLECYSTECTOMY  2003  . COLONOSCOPY N/A 11/29/2013   Procedure: COLONOSCOPY;  Surgeon: Rogene Houston, MD;  Location: [facility];  Service: Endoscopy;  Laterality: N/A;  120-rescheduled to [address] notified pt  . GASTRIC BYPASS  2006  . TONSILLECTOMY  1990     Review of Systems  Constitutional: Positive for malaise/fatigue (feels a lot better since she received her IV iron 3 weeks ago. ).  HENT: Negative.   Eyes: Negative.   Respiratory: Negative.   Cardiovascular: Negative.   Gastrointestinal: Negative.  Negative for abdominal pain.  Genitourinary: Negative.   Musculoskeletal: Negative.   Skin: Negative.   Neurological: Negative.   Endo/Heme/Allergies: Negative.   Psychiatric/Behavioral: Negative.   All other systems reviewed and are negative.    PHYSICAL EXAMINATION  ECOG PERFORMANCE STATUS: 0 - Asymptomatic  Vitals:   01/05/17 1033  BP: (!) 112/51  Pulse: 88  Resp: 16  Temp: 98.6 F (37 C)   Filed Weights   01/05/17 1033  Weight: 215 lb (97.5 kg)     Physical Exam  Constitutional: She is oriented to person, place, and time and well-developed, well-nourished, and in no distress.  HENT:  Head: Normocephalic and atraumatic.  Eyes: Conjunctivae and EOM are normal. Pupils are equal, round, and reactive to light.  Neck: Normal range of motion. Neck supple.  Cardiovascular: Normal rate, regular rhythm and normal heart sounds.   Pulmonary/Chest: Effort normal and breath sounds normal.  Abdominal: Soft. Bowel sounds are normal.  Musculoskeletal: Normal  range of motion.  Neurological: She is alert and oriented to person, place, and time. Gait normal.  Skin: Skin is warm and dry.  Nursing note and vitals reviewed.     LABORATORY DATA: I have reviewed the data as listed. CBC    Component Value Date/Time   WBC 12.7 (H) 01/05/2017 1013   RBC 4.04 01/05/2017 1013   HGB 12.5 01/05/2017 1013   HCT 37.6 01/05/2017 1013   PLT 241 01/05/2017 1013   MCV 93.1 01/05/2017 1013   MCH 30.9 01/05/2017 1013   MCHC 33.2 01/05/2017 1013   RDW 13.7 01/05/2017 1013   LYMPHSABS 2.0 01/05/2017 1013   MONOABS 0.5 01/05/2017 1013   EOSABS 0.2 01/05/2017 1013   BASOSABS 0.0 01/05/2017 1013    Iron/TIBC/Ferritin/ %Sat    Component Value Date/Time   IRON 57 10/13/2016 1142   TIBC 384 10/13/2016 1142   FERRITIN 40 11/24/2016 1209   IRONPCTSAT 15 10/13/2016 1142    ASSESSMENT/PLAN Iron deficiency anemia in the setting of pregnancy Gastric bypass Iron malabsorption B12 deficiency Menorrhagia  Labs reviewed. Results noted above.  I will review her iron labs when they come back. The goal is to keep her ferritin above 100. IV iron replacement will be based on her lab results.  RTC in 6 weeks with repeat CBC, CMP, iron studies.     All questions were answered. The patient knows to call the clinic with any problems, questions or concerns. We can certainly see the patient much sooner if necessary.  This document serves as a record of services personally performed by Twana First, MD. It was created on her behalf by Shirlean Mylar, a trained medical scribe. The creation of this record is based on the scribe's personal observations and the provider's statements to them. This document has been checked and approved by the attending provider.   I have reviewed the above documentation for accuracy and completeness, and I agree with the above.  This note was signed electronically  [employer]  01/05/2017

## 2017-01-07 ENCOUNTER — Encounter (HOSPITAL_BASED_OUTPATIENT_CLINIC_OR_DEPARTMENT_OTHER): Payer: BLUE CROSS/BLUE SHIELD

## 2017-01-07 VITALS — BP 112/65 | HR 90 | Temp 99.7°F | Resp 16

## 2017-01-07 DIAGNOSIS — O99112 Other diseases of the blood and blood-forming organs and certain disorders involving the immune mechanism complicating pregnancy, second trimester: Secondary | ICD-10-CM

## 2017-01-07 DIAGNOSIS — D509 Iron deficiency anemia, unspecified: Secondary | ICD-10-CM | POA: Diagnosis not present

## 2017-01-07 DIAGNOSIS — E538 Deficiency of other specified B group vitamins: Secondary | ICD-10-CM

## 2017-01-07 MED ORDER — SODIUM CHLORIDE 0.9 % IV SOLN
Freq: Once | INTRAVENOUS | Status: AC
  Administered 2017-01-07: 15:00:00 via INTRAVENOUS

## 2017-01-07 MED ORDER — SODIUM CHLORIDE 0.9 % IV SOLN
125.0000 mg | Freq: Once | INTRAVENOUS | Status: AC
  Administered 2017-01-07: 125 mg via INTRAVENOUS
  Filled 2017-01-07: qty 10

## 2017-01-07 NOTE — Progress Notes (Signed)
Tolerated iron infusion well. Stable and ambulatory on discharge home to self. 

## 2017-01-07 NOTE — Patient Instructions (Signed)
[place] at [facility] Discharge Instructions  RECOMMENDATIONS MADE BY THE CONSULTANT AND ANY TEST RESULTS WILL BE SENT TO YOUR REFERRING PHYSICIAN.  Ferric gluconate 125 mg iron infusion given today as ordered. Return as scheduled.  Thank you for choosing [place] at [facility] to provide your oncology and hematology care.  To afford each patient quality time with our provider, please arrive at least 15 minutes before your scheduled appointment time.    If you have a lab appointment with the [facility] please come in thru the  Main Entrance and check in at the main information desk  You need to re-schedule your appointment should you arrive 10 or more minutes late.  We strive to give you quality time with our providers, and arriving late affects you and other patients whose appointments are after yours.  Also, if you no show three or more times for appointments you may be dismissed from the clinic at the providers discretion.     Again, thank you for choosing [facility].  Our hope is that these requests will decrease the amount of time that you wait before being seen by our physicians.       _____________________________________________________________  Should you have questions after your visit to [facility], please contact our office at [phone] between the hours of 8:30 a.m. and 4:30 p.m.  Voicemails left after 4:30 p.m. will not be returned until the following business day.  For prescription refill requests, have your pharmacy contact our office.       Resources For Cancer Patients and their Caregivers ? American Cancer Society: Can assist with transportation, wigs, general needs, runs Look Good Feel Better.        979-295-0614 ? Cancer Care: Provides financial assistance, online support groups, medication/co-pay assistance.  1-800-813-HOPE 430-531-2459) ? [place] Assists  [place] cancer patients and their families through emotional , educational and financial support.  825-636-5613 ? [place] DSS Where to apply for food stamps, Medicaid and utility assistance. 917-788-2168 ? RCATS: Transportation to medical appointments. (334) [phone] ? Social Security Administration: May apply for disability if have a Stage IV cancer. 254-635-2221 9344482562 ? [employer], Disability and Transit Services: Assists with nutrition, care and transit needs. [place] Support Programs: @10RELATIVEDAYS @ > Cancer Support Group  2nd Tuesday of the month 1pm-2pm, Journey Room  > Creative Journey  3rd Tuesday of the month 1130am-1pm, Journey Room  > Look Good Feel Better  1st Wednesday of the month 10am-12 noon, Journey Room (Call [place] to register [phone])

## 2017-01-08 DIAGNOSIS — O358XX9 Maternal care for other (suspected) fetal abnormality and damage, other fetus: Secondary | ICD-10-CM | POA: Diagnosis not present

## 2017-01-11 ENCOUNTER — Other Ambulatory Visit (HOSPITAL_COMMUNITY): Payer: Self-pay | Admitting: Oncology

## 2017-01-11 ENCOUNTER — Ambulatory Visit (HOSPITAL_COMMUNITY): Payer: BLUE CROSS/BLUE SHIELD

## 2017-01-12 ENCOUNTER — Ambulatory Visit: Payer: BLUE CROSS/BLUE SHIELD | Admitting: Family Medicine

## 2017-01-12 DIAGNOSIS — F329 Major depressive disorder, single episode, unspecified: Secondary | ICD-10-CM | POA: Diagnosis not present

## 2017-01-12 DIAGNOSIS — G43719 Chronic migraine without aura, intractable, without status migrainosus: Secondary | ICD-10-CM | POA: Diagnosis not present

## 2017-01-12 DIAGNOSIS — I482 Chronic atrial fibrillation: Secondary | ICD-10-CM | POA: Diagnosis not present

## 2017-01-12 DIAGNOSIS — E6609 Other obesity due to excess calories: Secondary | ICD-10-CM | POA: Diagnosis not present

## 2017-01-14 ENCOUNTER — Ambulatory Visit (HOSPITAL_COMMUNITY): Payer: BLUE CROSS/BLUE SHIELD

## 2017-01-14 ENCOUNTER — Encounter (HOSPITAL_BASED_OUTPATIENT_CLINIC_OR_DEPARTMENT_OTHER): Payer: BLUE CROSS/BLUE SHIELD

## 2017-01-14 VITALS — BP 113/56 | HR 90 | Temp 98.0°F | Resp 16

## 2017-01-14 DIAGNOSIS — D509 Iron deficiency anemia, unspecified: Secondary | ICD-10-CM | POA: Diagnosis not present

## 2017-01-14 DIAGNOSIS — E538 Deficiency of other specified B group vitamins: Secondary | ICD-10-CM | POA: Diagnosis not present

## 2017-01-14 DIAGNOSIS — O99112 Other diseases of the blood and blood-forming organs and certain disorders involving the immune mechanism complicating pregnancy, second trimester: Secondary | ICD-10-CM

## 2017-01-14 MED ORDER — CYANOCOBALAMIN 1000 MCG/ML IJ SOLN
1000.0000 ug | Freq: Once | INTRAMUSCULAR | Status: AC
  Administered 2017-01-14: 1000 ug via INTRAMUSCULAR

## 2017-01-14 MED ORDER — SODIUM CHLORIDE 0.9 % IV SOLN
125.0000 mg | Freq: Once | INTRAVENOUS | Status: AC
  Administered 2017-01-14: 125 mg via INTRAVENOUS
  Filled 2017-01-14: qty 10

## 2017-01-14 MED ORDER — SODIUM CHLORIDE 0.9 % IV SOLN
Freq: Once | INTRAVENOUS | Status: AC
  Administered 2017-01-14: 14:00:00 via INTRAVENOUS

## 2017-01-14 MED ORDER — CYANOCOBALAMIN 1000 MCG/ML IJ SOLN
INTRAMUSCULAR | Status: AC
  Filled 2017-01-14: qty 1

## 2017-01-14 NOTE — Progress Notes (Signed)
Alejandra Barr presents today for injection per MD orders. B12 1000 mcg administered IM in left Upper Arm. Administration without incident. Patient tolerated well.  Tolerated iron infusion well. Stable and ambulatory on discharge home to self.

## 2017-01-14 NOTE — Patient Instructions (Signed)
[place] at [facility] Discharge Instructions  RECOMMENDATIONS MADE BY THE CONSULTANT AND ANY TEST RESULTS WILL BE SENT TO YOUR REFERRING PHYSICIAN.  Ferric gluconate 125 mg iron infusion given today as ordered. Vitamin B12 1000 mcg injection given as ordered. Return as scheduled.  Thank you for choosing [place] at [facility] to provide your oncology and hematology care.  To afford each patient quality time with our provider, please arrive at least 15 minutes before your scheduled appointment time.    If you have a lab appointment with the [facility] please come in thru the  Main Entrance and check in at the main information desk  You need to re-schedule your appointment should you arrive 10 or more minutes late.  We strive to give you quality time with our providers, and arriving late affects you and other patients whose appointments are after yours.  Also, if you no show three or more times for appointments you may be dismissed from the clinic at the providers discretion.     Again, thank you for choosing [facility].  Our hope is that these requests will decrease the amount of time that you wait before being seen by our physicians.       _____________________________________________________________  Should you have questions after your visit to [facility], please contact our office at [phone] between the hours of 8:30 a.m. and 4:30 p.m.  Voicemails left after 4:30 p.m. will not be returned until the following business day.  For prescription refill requests, have your pharmacy contact our office.       Resources For Cancer Patients and their Caregivers ? American Cancer Society: Can assist with transportation, wigs, general needs, runs Look Good Feel Better.        ([phone] ? Cancer Care: Provides financial assistance, online support groups, medication/co-pay assistance.  1-800-813-HOPE  623-151-9594) ? [facility] Assists [place] cancer patients and their families through emotional , educational and financial support.  915-137-8242 ? [place] DSS Where to apply for food stamps, Medicaid and utility assistance. 765 426 9944 ? RCATS: Transportation to medical appointments. 346-488-6500 ? Social Security Administration: May apply for disability if have a Stage IV cancer. 607-035-7225 218-687-5118 ? [employer], Disability and Transit Services: Assists with nutrition, care and transit needs. [place] Support Programs: @10RELATIVEDAYS @ > Cancer Support Group  2nd Tuesday of the month 1pm-2pm, Journey Room  > Creative Journey  3rd Tuesday of the month 1130am-1pm, Journey Room  > Look Good Feel Better  1st Wednesday of the month 10am-12 noon, Journey Room (Call [facility] [place] to register [phone])

## 2017-01-18 ENCOUNTER — Encounter (HOSPITAL_BASED_OUTPATIENT_CLINIC_OR_DEPARTMENT_OTHER): Payer: BLUE CROSS/BLUE SHIELD

## 2017-01-18 ENCOUNTER — Encounter (HOSPITAL_COMMUNITY): Payer: Self-pay

## 2017-01-18 VITALS — BP 96/57 | HR 80 | Temp 98.7°F | Resp 18

## 2017-01-18 DIAGNOSIS — E538 Deficiency of other specified B group vitamins: Secondary | ICD-10-CM

## 2017-01-18 DIAGNOSIS — O99112 Other diseases of the blood and blood-forming organs and certain disorders involving the immune mechanism complicating pregnancy, second trimester: Secondary | ICD-10-CM | POA: Diagnosis not present

## 2017-01-18 DIAGNOSIS — D509 Iron deficiency anemia, unspecified: Secondary | ICD-10-CM

## 2017-01-18 MED ORDER — SODIUM CHLORIDE 0.9 % IV SOLN
Freq: Once | INTRAVENOUS | Status: AC
  Administered 2017-01-18: 14:00:00 via INTRAVENOUS

## 2017-01-18 MED ORDER — SODIUM CHLORIDE 0.9 % IV SOLN
125.0000 mg | Freq: Once | INTRAVENOUS | Status: AC
  Administered 2017-01-18: 125 mg via INTRAVENOUS
  Filled 2017-01-18: qty 10

## 2017-01-18 NOTE — Progress Notes (Signed)
Alejandra Barr tolerated Iron infusion well without complaints or incident. VSS upon discharge. Pt discharged self ambulatory in satisfactory condition

## 2017-01-18 NOTE — Patient Instructions (Signed)
[place] at [facility] Discharge Instructions  RECOMMENDATIONS MADE BY THE CONSULTANT AND ANY TEST RESULTS WILL BE SENT TO YOUR REFERRING PHYSICIAN.  Received Iron infusion today. Follow-up as scheduled. Call clinic for any questions or concerns  Thank you for choosing [place] at [facility] to provide your oncology and hematology care.  To afford each patient quality time with our provider, please arrive at least 15 minutes before your scheduled appointment time.    If you have a lab appointment with the [place] please come in thru the  Main Entrance and check in at the main information desk  You need to re-schedule your appointment should you arrive 10 or more minutes late.  We strive to give you quality time with our providers, and arriving late affects you and other patients whose appointments are after yours.  Also, if you no show three or more times for appointments you may be dismissed from the clinic at the providers discretion.     Again, thank you for choosing [facility].  Our hope is that these requests will decrease the amount of time that you wait before being seen by our physicians.       _____________________________________________________________  Should you have questions after your visit to [facility], please contact our office at [phone] between the hours of 8:30 a.m. and 4:30 p.m.  Voicemails left after 4:30 p.m. will not be returned until the following business day.  For prescription refill requests, have your pharmacy contact our office.       Resources For Cancer Patients and their Caregivers ? American Cancer Society: Can assist with transportation, wigs, general needs, runs Look Good Feel Better.        [phone] ? Cancer Care: Provides financial assistance, online support groups, medication/co-pay assistance.  1-800-813-HOPE 313-614-6813) ? [facility] Assists [place] cancer patients and their families through emotional , educational and financial support.  [phone] ? [place] DSS Where to apply for food stamps, Medicaid and utility assistance. 757 429 4384 ? RCATS: Transportation to medical appointments. 4401942811 ? Social Security Administration: May apply for disability if have a Stage IV cancer. [phone] ? [employer], Disability and Transit Services: Assists with nutrition, care and transit needs. [place] Support Programs: @10RELATIVEDAYS @ > Cancer Support Group  2nd Tuesday of the month 1pm-2pm, Journey Room  > Creative Journey  3rd Tuesday of the month 1130am-1pm, Journey Room  > Look Good Feel Better  1st Wednesday of the month 10am-12 noon, Journey Room (Call [place] to register [phone])

## 2017-01-19 ENCOUNTER — Telehealth: Payer: Self-pay | Admitting: Family Medicine

## 2017-01-19 MED ORDER — METOPROLOL SUCCINATE ER 25 MG PO TB24: 25.0000 mg | ORAL_TABLET | Freq: Every day | ORAL | 1 refills | Status: DC

## 2017-01-19 NOTE — Telephone Encounter (Signed)
Patient is calling to verify that you received a fax or call regarding the refill on her hear medication. She said it was sent last Thursday and the pharmacy hasnt received anything back yet. She has 4 pills left.   Pharmacy:  [facility] mail [facility]   cb#: [phone]

## 2017-01-21 ENCOUNTER — Ambulatory Visit (HOSPITAL_COMMUNITY): Payer: BLUE CROSS/BLUE SHIELD

## 2017-02-06 DIAGNOSIS — G43719 Chronic migraine without aura, intractable, without status migrainosus: Secondary | ICD-10-CM | POA: Diagnosis not present

## 2017-02-11 DIAGNOSIS — G43711 Chronic migraine without aura, intractable, with status migrainosus: Secondary | ICD-10-CM | POA: Diagnosis not present

## 2017-02-15 ENCOUNTER — Encounter (HOSPITAL_COMMUNITY): Payer: BLUE CROSS/BLUE SHIELD

## 2017-02-15 ENCOUNTER — Encounter (HOSPITAL_BASED_OUTPATIENT_CLINIC_OR_DEPARTMENT_OTHER): Payer: BLUE CROSS/BLUE SHIELD

## 2017-02-15 ENCOUNTER — Encounter (HOSPITAL_COMMUNITY): Payer: Self-pay

## 2017-02-15 ENCOUNTER — Ambulatory Visit (HOSPITAL_COMMUNITY): Payer: BLUE CROSS/BLUE SHIELD

## 2017-02-15 ENCOUNTER — Encounter (HOSPITAL_COMMUNITY): Payer: BLUE CROSS/BLUE SHIELD | Attending: Oncology | Admitting: Oncology

## 2017-02-15 VITALS — BP 128/70 | HR 91 | Temp 99.0°F | Resp 18 | Wt 225.3 lb

## 2017-02-15 DIAGNOSIS — E538 Deficiency of other specified B group vitamins: Secondary | ICD-10-CM

## 2017-02-15 DIAGNOSIS — D508 Other iron deficiency anemias: Secondary | ICD-10-CM

## 2017-02-15 DIAGNOSIS — Z9884 Bariatric surgery status: Secondary | ICD-10-CM

## 2017-02-15 DIAGNOSIS — D509 Iron deficiency anemia, unspecified: Secondary | ICD-10-CM

## 2017-02-15 DIAGNOSIS — O99012 Anemia complicating pregnancy, second trimester: Secondary | ICD-10-CM

## 2017-02-15 LAB — COMPREHENSIVE METABOLIC PANEL
ALT: 11 U/L — ABNORMAL LOW (ref 14–54)
AST: 15 U/L (ref 15–41)
Albumin: 2.9 g/dL — ABNORMAL LOW (ref 3.5–5.0)
Alkaline Phosphatase: 69 U/L (ref 38–126)
Anion gap: 7 (ref 5–15)
BUN: 14 mg/dL (ref 6–20)
CO2: 20 mmol/L — ABNORMAL LOW (ref 22–32)
Calcium: 8.9 mg/dL (ref 8.9–10.3)
Chloride: 107 mmol/L (ref 101–111)
Creatinine, Ser: 0.51 mg/dL (ref 0.44–1.00)
GFR calc Af Amer: >60 mL/min (ref >60)
GFR calc non Af Amer: >60 mL/min (ref >60)
Glucose, Bld: 91 mg/dL (ref 65–99)
Potassium: 3.8 mmol/L (ref 3.5–5.1)
Sodium: 134 mmol/L — ABNORMAL LOW (ref 135–145)
Total Bilirubin: 0.2 mg/dL — ABNORMAL LOW (ref 0.3–1.2)
Total Protein: 6.2 g/dL — ABNORMAL LOW (ref 6.5–8.1)

## 2017-02-15 LAB — CBC WITH DIFFERENTIAL/PLATELET
Basophils Absolute: 0 10*3/uL (ref 0.0–0.1)
Basophils Relative: 0 %
Eosinophils Absolute: 0.2 10*3/uL (ref 0.0–0.7)
Eosinophils Relative: 1 %
HCT: 37.3 % (ref 36.0–46.0)
Hemoglobin: 12.7 g/dL (ref 12.0–15.0)
Lymphocytes Relative: 18 %
Lymphs Abs: 2.1 10*3/uL (ref 0.7–4.0)
MCH: 31.8 pg (ref 26.0–34.0)
MCHC: 34 g/dL (ref 30.0–36.0)
MCV: 93.3 fL (ref 78.0–100.0)
Monocytes Absolute: 0.5 10*3/uL (ref 0.1–1.0)
Monocytes Relative: 4 %
Neutro Abs: 9.3 10*3/uL — ABNORMAL HIGH (ref 1.7–7.7)
Neutrophils Relative %: 77 %
Platelets: 247 10*3/uL (ref 150–400)
RBC: 4 MIL/uL (ref 3.87–5.11)
RDW: 13.8 % (ref 11.5–15.5)
WBC: 12.1 10*3/uL — ABNORMAL HIGH (ref 4.0–10.5)

## 2017-02-15 LAB — IRON AND TIBC
Iron: 67 ug/dL (ref 28–170)
Saturation Ratios: 16 % (ref 10.4–31.8)
TIBC: 427 ug/dL (ref 250–450)
UIBC: 360 ug/dL

## 2017-02-15 LAB — FERRITIN: Ferritin: 58 ng/mL (ref 11–307)

## 2017-02-15 MED ORDER — CYANOCOBALAMIN 1000 MCG/ML IJ SOLN
1000.0000 ug | Freq: Once | INTRAMUSCULAR | Status: AC
  Administered 2017-02-15: 1000 ug via INTRAMUSCULAR
  Filled 2017-02-15: qty 1

## 2017-02-15 MED ORDER — FERROUS SULFATE 300 (60 FE) MG/5ML PO SYRP: 300.0000 mg | ORAL_SOLUTION | Freq: Two times a day (BID) | ORAL | 3 refills | Status: DC

## 2017-02-15 NOTE — Progress Notes (Signed)
Alejandra Barr presents today for injection per the provider's orders.  B12 administration without incident; see MAR for injection details.  Patient tolerated procedure well and without incident.  No questions or complaints noted at this time.  Discharged ambulatory.

## 2017-02-15 NOTE — Patient Instructions (Signed)
[place] at [facility] Discharge Instructions  RECOMMENDATIONS MADE BY THE CONSULTANT AND ANY TEST RESULTS WILL BE SENT TO YOUR REFERRING PHYSICIAN.  You were seen today by Dr. Twana First Lab work in 6 weeks Follow up in 3 months with lab work See Amy up front for appointments   Thank you for choosing [place] at [facility] to provide your oncology and hematology care.  To afford each patient quality time with our provider, please arrive at least 15 minutes before your scheduled appointment time.    If you have a lab appointment with the [place] please come in thru the  Main Entrance and check in at the main information desk  You need to re-schedule your appointment should you arrive 10 or more minutes late.  We strive to give you quality time with our providers, and arriving late affects you and other patients whose appointments are after yours.  Also, if you no show three or more times for appointments you may be dismissed from the clinic at the providers discretion.     Again, thank you for choosing [facility].  Our hope is that these requests will decrease the amount of time that you wait before being seen by our physicians.       _____________________________________________________________  Should you have questions after your visit to [facility], please contact our office at [phone] between the hours of 8:30 a.m. and 4:30 p.m.  Voicemails left after 4:30 p.m. will not be returned until the following business day.  For prescription refill requests, have your pharmacy contact our office.       Resources For Cancer Patients and their Caregivers ? American Cancer Society: Can assist with transportation, wigs, general needs, runs Look Good Feel Better.        430-611-4625 ? Cancer Care: Provides financial assistance, online support groups, medication/co-pay assistance.  1-800-813-HOPE  416-257-5994) ? [place] Assists [place] cancer patients and their families through emotional , educational and financial support.  315-350-6546 ? [place] DSS Where to apply for food stamps, Medicaid and utility assistance. 2677062018 ? RCATS: Transportation to medical appointments. 210-086-4990 ? Social Security Administration: May apply for disability if have a Stage IV cancer. 203 647 9892 865-092-3233 ? [employer], Disability and Transit Services: Assists with nutrition, care and transit needs. [place] Support Programs: @10RELATIVEDAYS @ > Cancer Support Group  2nd Tuesday of the month 1pm-2pm, Journey Room  > Creative Journey  3rd Tuesday of the month 1130am-1pm, Journey Room  > Look Good Feel Better  1st Wednesday of the month 10am-12 noon, Journey Room (Call [place] to register [phone])

## 2017-02-15 NOTE — Progress Notes (Signed)
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 female returns for routine follow-up of iron deficiency anemia. She has a history of gastric bypass surgery and irregular/heavy menses. She also has a history of B12 deficiency.  She takes a prenatal vitamin twice daily. She would like to try to have a child.  Alejandra Barr presents to the clinic unaccompanied for continued follow up. I personally reviewed and went over labs with the patient.  She state she is [redacted] weeks pregnant. Her due date is May 21, 2017.   She states she feels better since she last got Nulecit for IV iron replacement.  She notes she is on prenatal vitamin with iron 2x a day as well as liquid iron.   On review of systems, the patient reports some constipation which is well managed with colace. She denies nausea or vomiting.   Past Medical History:  Diagnosis Date  . Asthma   . Atrial flutter ([place])    Status post RFA by Dr. Lovena Le in 2004  . Chronic anxiety   . Chronic depression   . 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 Iron deficiency   . Iron deficiency anemia 10/02/2010   Qualifier: Diagnosis of  By: Moshe Cipro MD, Joycelyn Schmid    . Obesity     has Morbid obesity ([place]); Iron deficiency anemia; BIPOLAR II DISORDER; ANXIETY, CHRONIC; CLUSTER HEADACHE; Atrial flutter ([place]); Asthma, mild intermittent, well-controlled; Palpitations; Depression; Vitamin D deficiency; Insomnia; GAD (generalized anxiety disorder); Low vitamin B12 level; Abnormal antibody titer--Strongyloides; Abdominal pain, other specified site; Migraine headache with aura; Encounter for genetic counseling and testing; Encounter for preconception consultation; Anemia; Asthma; Eosinophil count raised; Iron  deficiency; Prurigo nodularis; and Asthma, well controlled on her problem list.     No history exists.     is allergic to ciprofloxacin; morphine; morphine and related; and penicillins.  [name] had no medications administered during this visit.  Past Surgical History:  Procedure Laterality Date  . Bilateral eustachian tube placement on 4 different occasions    . CHOLECYSTECTOMY  2003  . COLONOSCOPY N/A 11/29/2013   Procedure: COLONOSCOPY;  Surgeon: Rogene Houston, MD;  Location: [facility];  Service: Endoscopy;  Laterality: N/A;  120-rescheduled to [address] notified pt  . GASTRIC BYPASS  2006  . TONSILLECTOMY  1990     Review of Systems  Constitutional: Malaise/fatigue: feels a lot better since she received her IV iron 3 weeks ago.   HENT: Negative.   Eyes: Negative.   Respiratory: Negative.   Cardiovascular: Negative.   Gastrointestinal: Positive for constipation. Negative for nausea and vomiting.  Genitourinary: Negative.   Musculoskeletal: Negative.   Skin: Negative.   Neurological: Negative.   Endo/Heme/Allergies: Negative.   Psychiatric/Behavioral: Negative.   All other systems reviewed and are negative.    PHYSICAL EXAMINATION  ECOG PERFORMANCE STATUS: 0 - Asymptomatic  Vitals:   02/15/17 1448  BP: 128/70  Pulse: 91  Resp: 18  Temp: 99 F (37.2 C)     Physical Exam  Constitutional: She is oriented to person, place, and time and well-developed, well-nourished, and in no distress.  HENT:  Head: Normocephalic and atraumatic.  Eyes: Conjunctivae and EOM are normal. Pupils are equal, round, and reactive to light.  Neck: Normal  range of motion. Neck supple.  Cardiovascular: Normal rate and regular rhythm.  Exam reveals no gallop and no friction rub.   No murmur heard. Pulmonary/Chest: Effort normal and breath sounds normal. No respiratory distress. She has no wheezes. She has no rales. She exhibits no tenderness.  Abdominal: Soft. Bowel sounds are  normal. There is no tenderness. There is no rebound and no guarding.  Musculoskeletal: Normal range of motion.  Neurological: She is alert and oriented to person, place, and time. Gait normal.  Skin: Skin is warm and dry.  Nursing note and vitals reviewed.   LABORATORY DATA: I have reviewed the data as listed. CBC    Component Value Date/Time   WBC 12.1 (H) 02/15/2017 1411   RBC 4.00 02/15/2017 1411   HGB 12.7 02/15/2017 1411   HCT 37.3 02/15/2017 1411   PLT 247 02/15/2017 1411   MCV 93.3 02/15/2017 1411   MCH 31.8 02/15/2017 1411   MCHC 34.0 02/15/2017 1411   RDW 13.8 02/15/2017 1411   LYMPHSABS 2.1 02/15/2017 1411   MONOABS 0.5 02/15/2017 1411   EOSABS 0.2 02/15/2017 1411   BASOSABS 0.0 02/15/2017 1411    Iron/TIBC/Ferritin/ %Sat    Component Value Date/Time   IRON 94 01/05/2017 1013   TIBC 405 01/05/2017 1013   FERRITIN 44 01/05/2017 1013   IRONPCTSAT 23 01/05/2017 1013    ASSESSMENT/PLAN Iron deficiency anemia in the setting of pregnancy Gastric bypass Iron malabsorption B12 deficiency Menorrhagia  PLAN: - Labs reviewed. - The patient will continue to receive monthly  B12 injections. - I refilled the patient's liquid iron prescriptions today. - RTC in 6 weeks for labs and follow up in 2 months. IV iron replacement will be based on her lab results.   All questions were answered. The patient knows to call the clinic with any problems, questions or concerns. We can certainly see the patient much sooner if necessary.  This document serves as a record of services personally performed by Twana First, MD. It was created on her behalf by Maryla Morrow, a trained medical scribe. The creation of this record is based on the scribe's personal observations and the provider's statements to them. This document has been checked and approved by the attending provider.  I have reviewed the above documentation for accuracy and completeness, and I agree with the above.  This  note was signed electronically by:  Twana First, MD 02/15/2017

## 2017-02-19 DIAGNOSIS — O26892 Other specified pregnancy related conditions, second trimester: Secondary | ICD-10-CM | POA: Diagnosis not present

## 2017-02-19 DIAGNOSIS — Z3480 Encounter for supervision of other normal pregnancy, unspecified trimester: Secondary | ICD-10-CM | POA: Diagnosis not present

## 2017-02-19 DIAGNOSIS — Z3A27 27 weeks gestation of pregnancy: Secondary | ICD-10-CM | POA: Diagnosis not present

## 2017-02-19 DIAGNOSIS — Z23 Encounter for immunization: Secondary | ICD-10-CM | POA: Diagnosis not present

## 2017-02-19 DIAGNOSIS — O09812 Supervision of pregnancy resulting from assisted reproductive technology, second trimester: Secondary | ICD-10-CM | POA: Diagnosis not present

## 2017-03-17 ENCOUNTER — Encounter: Payer: Self-pay | Admitting: Gynecology

## 2017-03-17 ENCOUNTER — Encounter (HOSPITAL_COMMUNITY): Payer: BLUE CROSS/BLUE SHIELD | Attending: Oncology

## 2017-03-17 ENCOUNTER — Encounter (HOSPITAL_COMMUNITY): Payer: Self-pay

## 2017-03-17 VITALS — BP 123/65 | HR 107 | Temp 98.1°F | Resp 18

## 2017-03-17 DIAGNOSIS — E538 Deficiency of other specified B group vitamins: Secondary | ICD-10-CM | POA: Insufficient documentation

## 2017-03-17 DIAGNOSIS — D508 Other iron deficiency anemias: Secondary | ICD-10-CM | POA: Insufficient documentation

## 2017-03-17 MED ORDER — CYANOCOBALAMIN 1000 MCG/ML IJ SOLN
1000.0000 ug | Freq: Once | INTRAMUSCULAR | Status: AC
  Administered 2017-03-17: 1000 ug via INTRAMUSCULAR

## 2017-03-17 MED ORDER — CYANOCOBALAMIN 1000 MCG/ML IJ SOLN
INTRAMUSCULAR | Status: AC
  Filled 2017-03-17: qty 1

## 2017-03-17 NOTE — Patient Instructions (Signed)
[facility] at [facility] Discharge Instructions  RECOMMENDATIONS MADE BY THE CONSULTANT AND ANY TEST RESULTS WILL BE SENT TO YOUR REFERRING PHYSICIAN.  Received Vit B12 injection today. Follow-up as scheduled. Call clinic for any questions or concerns  Thank you for choosing  [facility] Center at [facility] to provide your oncology and hematology care.  To afford each patient quality time with our provider, please arrive at least 15 minutes before your scheduled appointment time.    If you have a lab appointment with the Cancer Center please come in thru the  Main Entrance and check in at the main information desk  You need to re-schedule your appointment should you arrive 10 or more minutes late.  We strive to give you quality time with our providers, and arriving late affects you and other patients whose appointments are after yours.  Also, if you no show three or more times for appointments you may be dismissed from the clinic at the providers discretion.     Again, thank you for choosing [facility].  Our hope is that these requests will decrease the amount of time that you wait before being seen by our physicians.       _____________________________________________________________  Should you have questions after your visit to [facility], please contact our office at [phone] between the hours of 8:30 a.m. and 4:30 p.m.  Voicemails left after 4:30 p.m. will not be returned until the following business day.  For prescription refill requests, have your pharmacy contact our office.       Resources For Cancer Patients and their Caregivers ? American Cancer Society: Can assist with transportation, wigs, general needs, runs Look Good Feel Better.        1-888-227-6333 ? Cancer Care: Provides financial assistance, online support groups, medication/co-pay assistance.  1-800-813-HOPE (4673) ? [facility] Assists [place] cancer patients and their families through emotional , educational and financial support.  336-427-4357 ? [place] DSS Where to apply for food stamps, Medicaid and utility assistance. 336-342-1394 ? RCATS: Transportation to medical appointments. 336-347-2287 ? Social Security Administration: May apply for disability if have a Stage IV cancer. 336-342-7796 1-800-772-1213 ? [place] Aging, Disability and Transit Services: Assists with nutrition, care and transit needs. 336-349-2343  Cancer Center Support Programs: @10RELATIVEDAYS@ > Cancer Support Group  2nd Tuesday of the month 1pm-2pm, Journey Room  > Creative Journey  3rd Tuesday of the month 1130am-1pm, Journey Room  > Look Good Feel Better  1st Wednesday of the month 10am-12 noon, Journey Room (Call American Cancer Society to register 1-800-395-5775)   

## 2017-03-17 NOTE — Progress Notes (Signed)
Alejandra Barr tolerated Vit B12 injection well without complaints or incident. VSS Pt discharged self ambulatory in satisfactory condition

## 2017-03-30 ENCOUNTER — Encounter (HOSPITAL_COMMUNITY): Payer: BLUE CROSS/BLUE SHIELD

## 2017-03-30 ENCOUNTER — Encounter (HOSPITAL_COMMUNITY): Payer: Self-pay | Admitting: Oncology

## 2017-03-30 DIAGNOSIS — E538 Deficiency of other specified B group vitamins: Secondary | ICD-10-CM | POA: Diagnosis not present

## 2017-03-30 DIAGNOSIS — D508 Other iron deficiency anemias: Secondary | ICD-10-CM

## 2017-03-30 LAB — IRON AND TIBC
Iron: 91 ug/dL (ref 28–170)
Saturation Ratios: 18 % (ref 10.4–31.8)
TIBC: 493 ug/dL — ABNORMAL HIGH (ref 250–450)
UIBC: 402 ug/dL

## 2017-03-30 LAB — CBC WITH DIFFERENTIAL/PLATELET
Basophils Absolute: 0 10*3/uL (ref 0.0–0.1)
Basophils Relative: 0 %
Eosinophils Absolute: 0.1 10*3/uL (ref 0.0–0.7)
Eosinophils Relative: 1 %
HCT: 36.3 % (ref 36.0–46.0)
Hemoglobin: 12.3 g/dL (ref 12.0–15.0)
Lymphocytes Relative: 18 %
Lymphs Abs: 2.5 10*3/uL (ref 0.7–4.0)
MCH: 31.3 pg (ref 26.0–34.0)
MCHC: 33.9 g/dL (ref 30.0–36.0)
MCV: 92.4 fL (ref 78.0–100.0)
Monocytes Absolute: 0.7 10*3/uL (ref 0.1–1.0)
Monocytes Relative: 5 %
Neutro Abs: 10.2 10*3/uL — ABNORMAL HIGH (ref 1.7–7.7)
Neutrophils Relative %: 76 %
Platelets: 212 10*3/uL (ref 150–400)
RBC: 3.93 MIL/uL (ref 3.87–5.11)
RDW: 13.3 % (ref 11.5–15.5)
WBC: 13.5 10*3/uL — ABNORMAL HIGH (ref 4.0–10.5)

## 2017-03-30 LAB — COMPREHENSIVE METABOLIC PANEL
ALT: 9 U/L — ABNORMAL LOW (ref 14–54)
AST: 16 U/L (ref 15–41)
Albumin: 2.8 g/dL — ABNORMAL LOW (ref 3.5–5.0)
Alkaline Phosphatase: 83 U/L (ref 38–126)
Anion gap: 6 (ref 5–15)
BUN: 17 mg/dL (ref 6–20)
CO2: 22 mmol/L (ref 22–32)
Calcium: 8.7 mg/dL — ABNORMAL LOW (ref 8.9–10.3)
Chloride: 109 mmol/L (ref 101–111)
Creatinine, Ser: 0.56 mg/dL (ref 0.44–1.00)
GFR calc Af Amer: >60 mL/min (ref >60)
GFR calc non Af Amer: >60 mL/min (ref >60)
Glucose, Bld: 88 mg/dL (ref 65–99)
Potassium: 4.1 mmol/L (ref 3.5–5.1)
Sodium: 137 mmol/L (ref 135–145)
Total Bilirubin: 0.3 mg/dL (ref 0.3–1.2)
Total Protein: 6.2 g/dL — ABNORMAL LOW (ref 6.5–8.1)

## 2017-03-30 LAB — FERRITIN: Ferritin: 17 ng/mL (ref 11–307)

## 2017-03-30 LAB — VITAMIN B12: Vitamin B-12: 207 pg/mL (ref 180–914)

## 2017-04-01 ENCOUNTER — Other Ambulatory Visit (HOSPITAL_COMMUNITY): Payer: Self-pay | Admitting: Oncology

## 2017-04-01 ENCOUNTER — Other Ambulatory Visit (HOSPITAL_COMMUNITY): Payer: Self-pay | Admitting: Adult Health

## 2017-04-02 ENCOUNTER — Encounter (HOSPITAL_COMMUNITY): Payer: Self-pay

## 2017-04-02 ENCOUNTER — Encounter (HOSPITAL_COMMUNITY): Payer: BLUE CROSS/BLUE SHIELD | Attending: Oncology

## 2017-04-02 VITALS — BP 101/65 | HR 85 | Temp 98.9°F | Resp 16 | Wt 235.4 lb

## 2017-04-02 DIAGNOSIS — E538 Deficiency of other specified B group vitamins: Secondary | ICD-10-CM

## 2017-04-02 DIAGNOSIS — D508 Other iron deficiency anemias: Secondary | ICD-10-CM | POA: Insufficient documentation

## 2017-04-02 DIAGNOSIS — D509 Iron deficiency anemia, unspecified: Secondary | ICD-10-CM | POA: Diagnosis not present

## 2017-04-02 MED ORDER — SODIUM CHLORIDE 0.9 % IV SOLN
INTRAVENOUS | Status: DC
  Administered 2017-04-02: 11:00:00 via INTRAVENOUS

## 2017-04-02 MED ORDER — SODIUM CHLORIDE 0.9 % IV SOLN
250.0000 mg | Freq: Once | INTRAVENOUS | Status: AC
  Administered 2017-04-02: 250 mg via INTRAVENOUS
  Filled 2017-04-02: qty 15

## 2017-04-02 NOTE — Patient Instructions (Signed)
[facility] at [facility] Discharge Instructions  RECOMMENDATIONS MADE BY THE CONSULTANT AND ANY TEST RESULTS WILL BE SENT TO YOUR REFERRING PHYSICIAN.  Iron infusion today. Return as scheduled.   Thank you for choosing [facility] at [facility] to provide your oncology and hematology care.  To afford each patient quality time with our provider, please arrive at least 15 minutes before your scheduled appointment time.    If you have a lab appointment with the Cancer Center please come in thru the  Main Entrance and check in at the main information desk  You need to re-schedule your appointment should you arrive 10 or more minutes late.  We strive to give you quality time with our providers, and arriving late affects you and other patients whose appointments are after yours.  Also, if you no show three or more times for appointments you may be dismissed from the clinic at the providers discretion.     Again, thank you for choosing [facility].  Our hope is that these requests will decrease the amount of time that you wait before being seen by our physicians.       _____________________________________________________________  Should you have questions after your visit to [facility], please contact our office at [phone] between the hours of 8:30 a.m. and 4:30 p.m.  Voicemails left after 4:30 p.m. will not be returned until the following business day.  For prescription refill requests, have your pharmacy contact our office.       Resources For Cancer Patients and their Caregivers ? American Cancer Society: Can assist with transportation, wigs, general needs, runs Look Good Feel Better.        1-888-227-6333 ? Cancer Care: Provides financial assistance, online support groups, medication/co-pay assistance.  1-800-813-HOPE (4673) ? [facility] Assists [place] cancer patients and their  families through emotional , educational and financial support.  336-427-4357 ? [place] DSS Where to apply for food stamps, Medicaid and utility assistance. 336-342-1394 ? RCATS: Transportation to medical appointments. 336-347-2287 ? Social Security Administration: May apply for disability if have a Stage IV cancer. 336-342-7796 1-800-772-1213 ? [place] Aging, Disability and Transit Services: Assists with nutrition, care and transit needs. 336-349-2343  Cancer Center Support Programs: @10RELATIVEDAYS@ > Cancer Support Group  2nd Tuesday of the month 1pm-2pm, Journey Room  > Creative Journey  3rd Tuesday of the month 1130am-1pm, Journey Room  > Look Good Feel Better  1st Wednesday of the month 10am-12 noon, Journey Room (Call American Cancer Society to register 1-800-395-5775)   

## 2017-04-02 NOTE — Progress Notes (Signed)
Tolerated infusion w/o adverse reaction.  Alert, in no distress.  VSS.  Discharged ambulatory.  

## 2017-04-06 DIAGNOSIS — O26893 Other specified pregnancy related conditions, third trimester: Secondary | ICD-10-CM | POA: Diagnosis not present

## 2017-04-06 DIAGNOSIS — Z3A33 33 weeks gestation of pregnancy: Secondary | ICD-10-CM | POA: Diagnosis not present

## 2017-04-09 ENCOUNTER — Encounter (HOSPITAL_BASED_OUTPATIENT_CLINIC_OR_DEPARTMENT_OTHER): Payer: BLUE CROSS/BLUE SHIELD

## 2017-04-09 ENCOUNTER — Encounter (HOSPITAL_COMMUNITY): Payer: Self-pay

## 2017-04-09 VITALS — BP 92/47 | HR 96 | Temp 98.5°F | Resp 18

## 2017-04-09 DIAGNOSIS — E538 Deficiency of other specified B group vitamins: Secondary | ICD-10-CM

## 2017-04-09 DIAGNOSIS — D509 Iron deficiency anemia, unspecified: Secondary | ICD-10-CM

## 2017-04-09 MED ORDER — SODIUM CHLORIDE 0.9 % IV SOLN
INTRAVENOUS | Status: DC
  Administered 2017-04-09: 14:00:00 via INTRAVENOUS

## 2017-04-09 MED ORDER — NA FERRIC GLUC CPLX IN SUCROSE 12.5 MG/ML IV SOLN
250.0000 mg | Freq: Once | INTRAVENOUS | Status: AC
  Administered 2017-04-09: 250 mg via INTRAVENOUS
  Filled 2017-04-09: qty 20

## 2017-04-09 NOTE — Progress Notes (Signed)
Tolerated infusion w/o adverse reaction.  Alert, in no distress.  VSS.  Discharged ambulatory.  

## 2017-04-09 NOTE — Progress Notes (Signed)
Patient tolerated ferric gluconate infusion without complaint or reaction. Patient discharged ambulatory from clinic in stable condition to self.

## 2017-04-09 NOTE — Patient Instructions (Signed)
[facility] at [facility] Discharge Instructions  RECOMMENDATIONS MADE BY THE CONSULTANT AND ANY TEST RESULTS WILL BE SENT TO YOUR REFERRING PHYSICIAN.  Iron infusion today. Return as scheduled.   Thank you for choosing [facility] at [facility] to provide your oncology and hematology care.  To afford each patient quality time with our provider, please arrive at least 15 minutes before your scheduled appointment time.    If you have a lab appointment with the Cancer Center please come in thru the  Main Entrance and check in at the main information desk  You need to re-schedule your appointment should you arrive 10 or more minutes late.  We strive to give you quality time with our providers, and arriving late affects you and other patients whose appointments are after yours.  Also, if you no show three or more times for appointments you may be dismissed from the clinic at the providers discretion.     Again, thank you for choosing [facility].  Our hope is that these requests will decrease the amount of time that you wait before being seen by our physicians.       _____________________________________________________________  Should you have questions after your visit to [facility], please contact our office at [phone] between the hours of 8:30 a.m. and 4:30 p.m.  Voicemails left after 4:30 p.m. will not be returned until the following business day.  For prescription refill requests, have your pharmacy contact our office.       Resources For Cancer Patients and their Caregivers ? American Cancer Society: Can assist with transportation, wigs, general needs, runs Look Good Feel Better.        1-888-227-6333 ? Cancer Care: Provides financial assistance, online support groups, medication/co-pay assistance.  1-800-813-HOPE (4673) ? [facility] Assists [place] cancer patients and their  families through emotional , educational and financial support.  336-427-4357 ? [place] DSS Where to apply for food stamps, Medicaid and utility assistance. 336-342-1394 ? RCATS: Transportation to medical appointments. 336-347-2287 ? Social Security Administration: May apply for disability if have a Stage IV cancer. 336-342-7796 1-800-772-1213 ? [place] Aging, Disability and Transit Services: Assists with nutrition, care and transit needs. 336-349-2343  Cancer Center Support Programs: @10RELATIVEDAYS@ > Cancer Support Group  2nd Tuesday of the month 1pm-2pm, Journey Room  > Creative Journey  3rd Tuesday of the month 1130am-1pm, Journey Room  > Look Good Feel Better  1st Wednesday of the month 10am-12 noon, Journey Room (Call American Cancer Society to register 1-800-395-5775)   

## 2017-04-14 ENCOUNTER — Encounter (HOSPITAL_COMMUNITY): Payer: Self-pay | Admitting: Oncology

## 2017-04-19 ENCOUNTER — Ambulatory Visit (HOSPITAL_COMMUNITY): Payer: BLUE CROSS/BLUE SHIELD

## 2017-04-22 DIAGNOSIS — Z3685 Encounter for antenatal screening for Streptococcus B: Secondary | ICD-10-CM | POA: Diagnosis not present

## 2017-04-22 DIAGNOSIS — Z3480 Encounter for supervision of other normal pregnancy, unspecified trimester: Secondary | ICD-10-CM | POA: Diagnosis not present

## 2017-04-23 DIAGNOSIS — G43719 Chronic migraine without aura, intractable, without status migrainosus: Secondary | ICD-10-CM | POA: Diagnosis not present

## 2017-04-29 DIAGNOSIS — G43719 Chronic migraine without aura, intractable, without status migrainosus: Secondary | ICD-10-CM | POA: Diagnosis not present

## 2017-05-04 DIAGNOSIS — O26893 Other specified pregnancy related conditions, third trimester: Secondary | ICD-10-CM | POA: Diagnosis not present

## 2017-05-04 DIAGNOSIS — Z3A37 37 weeks gestation of pregnancy: Secondary | ICD-10-CM | POA: Diagnosis not present

## 2017-05-17 ENCOUNTER — Encounter (HOSPITAL_COMMUNITY): Payer: Self-pay | Admitting: *Deleted

## 2017-05-17 ENCOUNTER — Telehealth (HOSPITAL_COMMUNITY): Payer: Self-pay | Admitting: *Deleted

## 2017-05-17 LAB — OB RESULTS CONSOLE GBS: GBS: NEGATIVE

## 2017-05-17 NOTE — Telephone Encounter (Signed)
Preadmission screen  

## 2017-05-18 ENCOUNTER — Encounter (HOSPITAL_COMMUNITY): Payer: BLUE CROSS/BLUE SHIELD

## 2017-05-18 ENCOUNTER — Encounter (HOSPITAL_BASED_OUTPATIENT_CLINIC_OR_DEPARTMENT_OTHER): Payer: BLUE CROSS/BLUE SHIELD | Admitting: Oncology

## 2017-05-18 ENCOUNTER — Encounter (HOSPITAL_COMMUNITY): Payer: Self-pay | Admitting: Oncology

## 2017-05-18 ENCOUNTER — Other Ambulatory Visit (HOSPITAL_COMMUNITY): Payer: BLUE CROSS/BLUE SHIELD

## 2017-05-18 ENCOUNTER — Encounter (HOSPITAL_COMMUNITY): Payer: BLUE CROSS/BLUE SHIELD | Attending: Oncology

## 2017-05-18 ENCOUNTER — Ambulatory Visit (HOSPITAL_COMMUNITY): Payer: BLUE CROSS/BLUE SHIELD | Admitting: Adult Health

## 2017-05-18 VITALS — BP 105/70 | HR 89 | Resp 16 | Ht 62.0 in | Wt 244.0 lb

## 2017-05-18 DIAGNOSIS — D508 Other iron deficiency anemias: Secondary | ICD-10-CM

## 2017-05-18 DIAGNOSIS — Z9884 Bariatric surgery status: Secondary | ICD-10-CM | POA: Diagnosis not present

## 2017-05-18 DIAGNOSIS — E538 Deficiency of other specified B group vitamins: Secondary | ICD-10-CM | POA: Diagnosis not present

## 2017-05-18 DIAGNOSIS — N189 Chronic kidney disease, unspecified: Secondary | ICD-10-CM

## 2017-05-18 LAB — CBC WITH DIFFERENTIAL/PLATELET
Basophils Absolute: 0 10*3/uL (ref 0.0–0.1)
Basophils Relative: 0 %
Eosinophils Absolute: 0.1 10*3/uL (ref 0.0–0.7)
Eosinophils Relative: 1 %
HCT: 38 % (ref 36.0–46.0)
Hemoglobin: 12.9 g/dL (ref 12.0–15.0)
Lymphocytes Relative: 15 %
Lymphs Abs: 1.8 10*3/uL (ref 0.7–4.0)
MCH: 31.4 pg (ref 26.0–34.0)
MCHC: 33.9 g/dL (ref 30.0–36.0)
MCV: 92.5 fL (ref 78.0–100.0)
Monocytes Absolute: 0.7 10*3/uL (ref 0.1–1.0)
Monocytes Relative: 6 %
Neutro Abs: 9.5 10*3/uL — ABNORMAL HIGH (ref 1.7–7.7)
Neutrophils Relative %: 78 %
Platelets: 204 10*3/uL (ref 150–400)
RBC: 4.11 MIL/uL (ref 3.87–5.11)
RDW: 13.2 % (ref 11.5–15.5)
WBC: 12.1 10*3/uL — ABNORMAL HIGH (ref 4.0–10.5)

## 2017-05-18 LAB — COMPREHENSIVE METABOLIC PANEL
ALT: 11 U/L — ABNORMAL LOW (ref 14–54)
AST: 18 U/L (ref 15–41)
Albumin: 2.9 g/dL — ABNORMAL LOW (ref 3.5–5.0)
Alkaline Phosphatase: 135 U/L — ABNORMAL HIGH (ref 38–126)
Anion gap: 9 (ref 5–15)
BUN: 11 mg/dL (ref 6–20)
CO2: 21 mmol/L — ABNORMAL LOW (ref 22–32)
Calcium: 8.9 mg/dL (ref 8.9–10.3)
Chloride: 104 mmol/L (ref 101–111)
Creatinine, Ser: 0.62 mg/dL (ref 0.44–1.00)
GFR calc Af Amer: >60 mL/min (ref >60)
GFR calc non Af Amer: >60 mL/min (ref >60)
Glucose, Bld: 86 mg/dL (ref 65–99)
Potassium: 3.8 mmol/L (ref 3.5–5.1)
Sodium: 134 mmol/L — ABNORMAL LOW (ref 135–145)
Total Bilirubin: 0.4 mg/dL (ref 0.3–1.2)
Total Protein: 6.3 g/dL — ABNORMAL LOW (ref 6.5–8.1)

## 2017-05-18 LAB — FERRITIN: Ferritin: 35 ng/mL (ref 11–307)

## 2017-05-18 LAB — IRON AND TIBC
Iron: 86 ug/dL (ref 28–170)
Saturation Ratios: 17 % (ref 10.4–31.8)
TIBC: 501 ug/dL — ABNORMAL HIGH (ref 250–450)
UIBC: 415 ug/dL

## 2017-05-18 LAB — VITAMIN B12: Vitamin B-12: 170 pg/mL — ABNORMAL LOW (ref 180–914)

## 2017-05-18 MED ORDER — CYANOCOBALAMIN 1000 MCG/ML IJ SOLN
1000.0000 ug | Freq: Once | INTRAMUSCULAR | Status: AC
  Administered 2017-05-18: 1000 ug via INTRAMUSCULAR

## 2017-05-18 MED ORDER — CYANOCOBALAMIN 1000 MCG/ML IJ SOLN: 1000.0000 ug | Freq: Once | INTRAMUSCULAR | 0 refills | Status: AC

## 2017-05-18 MED ORDER — CYANOCOBALAMIN 1000 MCG/ML IJ SOLN
INTRAMUSCULAR | Status: AC
  Filled 2017-05-18: qty 1

## 2017-05-18 NOTE — Patient Instructions (Signed)
[place] at [facility] Discharge Instructions  RECOMMENDATIONS MADE BY THE CONSULTANT AND ANY TEST RESULTS WILL BE SENT TO YOUR REFERRING PHYSICIAN.  You were seen today by Kirby Crigler PA-C. Rx for B12 injections sent to pharmacy. Labs in about 3 weeks and again in about 9 weeks. Return in about 15 weeks for labs and follow up.   Thank you for choosing [place] at [facility] to provide your oncology and hematology care.  To afford each patient quality time with our provider, please arrive at least 15 minutes before your scheduled appointment time.    If you have a lab appointment with the [facility] please come in thru the  Main Entrance and check in at the main information desk  You need to re-schedule your appointment should you arrive 10 or more minutes late.  We strive to give you quality time with our providers, and arriving late affects you and other patients whose appointments are after yours.  Also, if you no show three or more times for appointments you may be dismissed from the clinic at the providers discretion.     Again, thank you for choosing [facility].  Our hope is that these requests will decrease the amount of time that you wait before being seen by our physicians.       _____________________________________________________________  Should you have questions after your visit to [facility], please contact our office at [phone] between the hours of 8:30 a.m. and 4:30 p.m.  Voicemails left after 4:30 p.m. will not be returned until the following business day.  For prescription refill requests, have your pharmacy contact our office.       Resources For Cancer Patients and their Caregivers ? American Cancer Society: Can assist with transportation, wigs, general needs, runs Look Good Feel Better.        (708) 717-7047 ? Cancer Care: Provides financial assistance, online support groups,  medication/co-pay assistance.  1-800-813-HOPE 760-393-2004) ? [facility] Assists [place] cancer patients and their families through emotional , educational and financial support.  (281) 209-9964 ? [place] DSS Where to apply for food stamps, Medicaid and utility assistance. 479 844 5161 ? RCATS: Transportation to medical appointments. (406) 455-7825 ? Social Security Administration: May apply for disability if have a Stage IV cancer. (936)457-1324 240-002-0205 ? [employer], Disability and Transit Services: Assists with nutrition, care and transit needs. [place] Support Programs: @10RELATIVEDAYS @ > Cancer Support Group  2nd Tuesday of the month 1pm-2pm, Journey Room  > Creative Journey  3rd Tuesday of the month 1130am-1pm, Journey Room  > Look Good Feel Better  1st Wednesday of the month 10am-12 noon, Journey Room (Call [place] to register [phone])

## 2017-05-18 NOTE — Progress Notes (Signed)
Alejandra Helper, MD [address], [address] [place] [address]  Other iron deficiency anemia - Plan: CBC with Differential, Iron and TIBC, Ferritin, CBC with Differential, Iron and TIBC, Ferritin, Vitamin B12, CBC with Differential, Basic metabolic panel, Ferritin, Vitamin B12, CANCELED: CBC with Differential, CANCELED: Basic metabolic panel, CANCELED: Ferritin, CANCELED: Iron and TIBC, CANCELED: CBC with Differential, CANCELED: Basic metabolic panel, CANCELED: Ferritin, CANCELED: Iron and TIBC, CANCELED: CBC with Differential, CANCELED: Basic metabolic panel, CANCELED: Ferritin, CANCELED: Iron and TIBC  Low vitamin B12 level - Plan: cyanocobalamin (,VITAMIN B-12,) 1000 MCG/ML injection, Vitamin B12, Vitamin B12, CANCELED: CBC with Differential, CANCELED: Vitamin B12, CANCELED: Folate  CURRENT THERAPY: IV iron replacement therapy  INTERVAL HISTORY: Alejandra Barr 31 y.o. female returns for followup of iron deficiency anemia.  H/O gastric bypass surgery and irregular/heavy menses.   AND H/O B12 deficiency.  She is on prenatal vitamin twice per day.   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  Scheduled for induction on 05/25/2017  Associated Signs & Symptoms: Fatigue/tired    Review of Systems  Constitutional: Positive for malaise/fatigue. Negative for chills, fever and weight loss.  HENT: Negative.   Eyes: Negative.   Respiratory: Negative.  Negative for cough.   Cardiovascular: Negative.  Negative for chest pain.  Gastrointestinal: Positive for constipation. Negative for blood in stool, diarrhea, melena, nausea and vomiting.  Genitourinary: Negative.   Musculoskeletal: Negative.   Skin: Negative.   Neurological: Negative.  Negative for weakness.    Endo/Heme/Allergies: Negative.   Psychiatric/Behavioral: Negative.     Past Medical History:  Diagnosis Date  . Asthma   . Atrial flutter ([place])    Status post RFA by Dr. Lovena Le in 2004  . Chronic anxiety   . Chronic depression   . 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 Iron deficiency   . Iron deficiency anemia 10/02/2010   Qualifier: Diagnosis of  By: Moshe Cipro MD, Joycelyn Schmid    . Low vitamin B12 level 07/10/2013   Overview:  Last Assessment & Plan:  Being treated through hematology  . Newborn product of in vitro fertilization (IVF) pregnancy   . Obesity     Past Surgical History:  Procedure Laterality Date  . Bilateral eustachian tube placement on 4 different occasions    . CARDIAC ELECTROPHYSIOLOGY MAPPING AND ABLATION    . CHOLECYSTECTOMY  2003  . COLONOSCOPY N/A 11/29/2013   Procedure: COLONOSCOPY;  Surgeon: Rogene Houston, MD;  Location: [facility];  Service: Endoscopy;  Laterality: N/A;  120-rescheduled to [address] notified pt  . GASTRIC BYPASS  2006  . TONSILLECTOMY  1990    Family History  Problem Relation Age of Onset  . Fibromyalgia Mother        Chronic pelvic pain   . Breast cancer Mother   . Hypothyroidism Mother   . Obesity Mother   . Heart defect Mother        Supraventricular, tachycardia /tachypalpation   . Arrhythmia Mother   . Hypertension Maternal Grandmother   . Diabetes Maternal Grandmother   . Hypertension Maternal Grandfather   . Heart disease Paternal Grandmother   . Hypertension Paternal Grandmother   . Heart disease Paternal Grandfather   . Hypertension Paternal Grandfather     Social  History   Social History  . Marital status: Married    Spouse name: N/A  . Number of children: N/A  . Years of education: N/A   Occupational History  . Nurse    .  [employer]   Social History Main Topics  . Smoking status: Former Smoker    Packs/day: 0.50    Types: Cigarettes    Start date: 03/21/2013    Quit date: 06/02/2015  . Smokeless tobacco: Never  Used  . Alcohol use No  . Drug use: No  . Sexual activity: Yes    Birth control/ protection: None     Comment: husband had vasectomy   Other Topics Concern  . Not on file   Social History Narrative  . No narrative on file     PHYSICAL EXAMINATION  ECOG PERFORMANCE STATUS: 0 - Asymptomatic  Vitals:   05/18/17 1419  BP: 105/70  Pulse: 89  Resp: 16    GENERAL:alert, no distress, well nourished, well developed, comfortable, cooperative, smiling and unaccompanied SKIN: skin color, texture, turgor are normal, no rashes or significant lesions HEAD: Normocephalic, No masses, lesions, tenderness or abnormalities EYES: normal, EOMI, Conjunctiva are pink and non-injected EARS: External ears normal OROPHARYNX:lips, buccal mucosa, and tongue normal and mucous membranes are moist  NECK: supple, trachea midline LYMPH:  no palpable lymphadenopathy BREAST:not examined LUNGS: clear to auscultation  HEART: regular rate & rhythm ABDOMEN:abdomen soft and obese, pregnant BACK: Back symmetric, no curvature. EXTREMITIES:less then 2 second capillary refill, no joint deformities, effusion, or inflammation, no skin discoloration, no cyanosis  NEURO: alert & oriented x 3 with fluent speech, no focal motor/sensory deficits, gait normal   LABORATORY DATA: CBC    Component Value Date/Time   WBC 12.1 (H) 05/18/2017 1351   RBC 4.11 05/18/2017 1351   HGB 12.9 05/18/2017 1351   HCT 38.0 05/18/2017 1351   PLT 204 05/18/2017 1351   MCV 92.5 05/18/2017 1351   MCH 31.4 05/18/2017 1351   MCHC 33.9 05/18/2017 1351   RDW 13.2 05/18/2017 1351   LYMPHSABS 1.8 05/18/2017 1351   MONOABS 0.7 05/18/2017 1351   EOSABS 0.1 05/18/2017 1351   BASOSABS 0.0 05/18/2017 1351      Chemistry      Component Value Date/Time   NA 134 (L) 05/18/2017 1351   K 3.8 05/18/2017 1351   CL 104 05/18/2017 1351   CO2 21 (L) 05/18/2017 1351   BUN 11 05/18/2017 1351   CREATININE 0.62 05/18/2017 1351   CREATININE 0.83  06/05/2014 1044      Component Value Date/Time   CALCIUM 8.9 05/18/2017 1351   ALKPHOS 135 (H) 05/18/2017 1351   AST 18 05/18/2017 1351   ALT 11 (L) 05/18/2017 1351   BILITOT 0.4 05/18/2017 1351     Lab Results  Component Value Date   IRON 91 03/30/2017   TIBC 493 (H) 03/30/2017   FERRITIN 17 03/30/2017     PENDING LABS:   RADIOGRAPHIC STUDIES:  No results found.   PATHOLOGY:    ASSESSMENT AND PLAN:  Iron deficiency anemia Iron deficiency anemia, S/P gastric bypass surgery and irregular/heavy menses.   AND H/O B12 deficiency, on prenatal vitamin BID. AND Currently pregnant, scheduled for induction on 05/25/2017.  Labs today: CBC diff, CMET, iron/TIBC, ferritin.  I personally reviewed and went over laboratory results with the patient.  The results are noted within this dictation.  Labs in 3 weeks: CBC diff, iron/TIBC, ferritin.   Labs in 9 weeks: CBC diff, iron/TIBC,  ferritin, B12 Labs in 15 weeks: CBC diff, BMET, iron/TIBC, ferritin, B12  Goals is to maintain a ferritin of 100 or greater as the patient experiences significant symptoms when ferritin is less than 50 (despite a normal HGB).  She does not have chronic renal disease and therefore ferric gluconate cannot be used, unless she becomes pregnant again in the future.  She is scheduled for induction on 05/25/2017.  Return in ~ 15 weeks for follow-up.  Low vitamin B12 level B12 low-normal- on B12 IM replacement (at home)  B12 injection is scheduled for today but due to cost, she wishes to cancel it.  Rx for B12 IM at home monthly.   ORDERS PLACED FOR THIS ENCOUNTER: Orders Placed This Encounter  Procedures  . CBC with Differential  . Iron and TIBC  . Ferritin  . CBC with Differential  . Iron and TIBC  . Ferritin  . Vitamin B12  . CBC with Differential  . Basic metabolic panel  . Ferritin  . Vitamin B12    MEDICATIONS PRESCRIBED THIS ENCOUNTER: Meds ordered this encounter  Medications  .  cyanocobalamin (,VITAMIN B-12,) 1000 MCG/ML injection    Sig: Inject 1 mL (1,000 mcg total) into the muscle once.    Dispense:  1 mL    Refill:  0    Please provide a 1 year supply    Order Specific Question:   Supervising Provider    Answer:   Brunetta Genera [[id number]]    THERAPY PLAN:  Continue to monitor vitamin studies and replace as indicated.  Maintain ferritin of 100 or greater.  All questions were answered. The patient knows to call the clinic with any problems, questions or concerns. We can certainly see the patient much sooner if necessary.  Patient and plan discussed with Dr. Twana First and she is in agreement with the aforementioned.   This note is electronically signed by: Doy Mince 05/18/2017 2:54 PM

## 2017-05-18 NOTE — Progress Notes (Signed)
Alejandra Barr tolerated Vit B12 injection well without complaints or incident. VSS Pt discharged self ambulatory in satisfactory condition

## 2017-05-18 NOTE — Patient Instructions (Signed)
[facility] at [facility] Discharge Instructions  RECOMMENDATIONS MADE BY THE CONSULTANT AND ANY TEST RESULTS WILL BE SENT TO YOUR REFERRING PHYSICIAN.  Received Vit B12 injection today. Follow-up as scheduled. Call clinic for any questions or concerns  Thank you for choosing [facility] at [facility] to provide your oncology and hematology care.  To afford each patient quality time with our provider, please arrive at least 15 minutes before your scheduled appointment time.    If you have a lab appointment with the Cancer Center please come in thru the  Main Entrance and check in at the main information desk  You need to re-schedule your appointment should you arrive 10 or more minutes late.  We strive to give you quality time with our providers, and arriving late affects you and other patients whose appointments are after yours.  Also, if you no show three or more times for appointments you may be dismissed from the clinic at the providers discretion.     Again, thank you for choosing [facility].  Our hope is that these requests will decrease the amount of time that you wait before being seen by our physicians.       _____________________________________________________________  Should you have questions after your visit to [facility], please contact our office at [phone] between the hours of 8:30 a.m. and 4:30 p.m.  Voicemails left after 4:30 p.m. will not be returned until the following business day.  For prescription refill requests, have your pharmacy contact our office.       Resources For Cancer Patients and their Caregivers ? American Cancer Society: Can assist with transportation, wigs, general needs, runs Look Good Feel Better.        1-888-227-6333 ? Cancer Care: Provides financial assistance, online support groups, medication/co-pay assistance.  1-800-813-HOPE (4673) ? [facility] Assists [place] cancer patients and their families through emotional , educational and financial support.  336-427-4357 ? [place] DSS Where to apply for food stamps, Medicaid and utility assistance. 336-342-1394 ? [facility]: Transportation to medical appointments. 336-347-2287 ? Social Security Administration: May apply for disability if have a Stage IV cancer. 336-342-7796 1-800-772-1213 ? [place] Aging, Disability and Transit Services: Assists with nutrition, care and transit needs. 336-349-2343  Cancer Center Support Programs: @10RELATIVEDAYS@ > Cancer Support Group  2nd Tuesday of the month 1pm-2pm, Journey Room  > Creative Journey  3rd Tuesday of the month 1130am-1pm, Journey Room  > Look Good Feel Better  1st Wednesday of the month 10am-12 noon, Journey Room (Call American Cancer Society to register 1-800-395-5775)   

## 2017-05-18 NOTE — Assessment & Plan Note (Addendum)
B12 low-normal- on B12 IM replacement (at home)  B12 injection is scheduled for today but due to cost, she wishes to cancel it.  Rx for B12 IM at home monthly.

## 2017-05-18 NOTE — Assessment & Plan Note (Addendum)
Iron deficiency anemia, S/P gastric bypass surgery and irregular/heavy menses.   AND H/O B12 deficiency, on prenatal vitamin BID. AND Currently pregnant, scheduled for induction on 05/25/2017.  Labs today: CBC diff, CMET, iron/TIBC, ferritin.  I personally reviewed and went over laboratory results with the patient.  The results are noted within this dictation.  Labs in 3 weeks: CBC diff, iron/TIBC, ferritin.   Labs in 9 weeks: CBC diff, iron/TIBC, ferritin, B12 Labs in 15 weeks: CBC diff, BMET, iron/TIBC, ferritin, B12  Goals is to maintain a ferritin of 100 or greater as the patient experiences significant symptoms when ferritin is less than 50 (despite a normal HGB).  She does not have chronic renal disease and therefore ferric gluconate cannot be used, unless she becomes pregnant again in the future.  She is scheduled for induction on 05/25/2017.  Return in ~ 15 weeks for follow-up.

## 2017-05-20 ENCOUNTER — Other Ambulatory Visit (HOSPITAL_COMMUNITY): Payer: Self-pay | Admitting: Oncology

## 2017-05-21 ENCOUNTER — Inpatient Hospital Stay (HOSPITAL_COMMUNITY)
Admission: AD | Admit: 2017-05-21 | Payer: BLUE CROSS/BLUE SHIELD | Source: Ambulatory Visit | Admitting: Obstetrics & Gynecology

## 2017-05-21 ENCOUNTER — Telehealth (HOSPITAL_COMMUNITY): Payer: Self-pay | Admitting: *Deleted

## 2017-05-21 NOTE — Telephone Encounter (Signed)
Preadmission screen  

## 2017-05-25 ENCOUNTER — Inpatient Hospital Stay (HOSPITAL_COMMUNITY): Payer: BLUE CROSS/BLUE SHIELD | Admitting: Anesthesiology

## 2017-05-25 ENCOUNTER — Encounter (HOSPITAL_COMMUNITY): Payer: Self-pay

## 2017-05-25 ENCOUNTER — Inpatient Hospital Stay (HOSPITAL_COMMUNITY)
Admission: RE | Admit: 2017-05-25 | Discharge: 2017-05-29 | DRG: 765 | Disposition: A | Discharge status: Home / Self Care | Payer: BLUE CROSS/BLUE SHIELD | Source: Ambulatory Visit | Attending: Obstetrics & Gynecology | Admitting: Obstetrics & Gynecology

## 2017-05-25 DIAGNOSIS — O99844 Bariatric surgery status complicating childbirth: Secondary | ICD-10-CM | POA: Diagnosis present

## 2017-05-25 DIAGNOSIS — O99214 Obesity complicating childbirth: Secondary | ICD-10-CM | POA: Diagnosis present

## 2017-05-25 DIAGNOSIS — D649 Anemia, unspecified: Secondary | ICD-10-CM | POA: Diagnosis not present

## 2017-05-25 DIAGNOSIS — Z23 Encounter for immunization: Secondary | ICD-10-CM | POA: Diagnosis not present

## 2017-05-25 DIAGNOSIS — O9952 Diseases of the respiratory system complicating childbirth: Secondary | ICD-10-CM | POA: Diagnosis not present

## 2017-05-25 DIAGNOSIS — Z3A40 40 weeks gestation of pregnancy: Secondary | ICD-10-CM

## 2017-05-25 DIAGNOSIS — Z98891 History of uterine scar from previous surgery: Secondary | ICD-10-CM

## 2017-05-25 DIAGNOSIS — O9902 Anemia complicating childbirth: Principal | ICD-10-CM | POA: Diagnosis present

## 2017-05-25 DIAGNOSIS — Z8489 Family history of other specified conditions: Secondary | ICD-10-CM | POA: Diagnosis not present

## 2017-05-25 DIAGNOSIS — Z87891 Personal history of nicotine dependence: Secondary | ICD-10-CM | POA: Diagnosis not present

## 2017-05-25 DIAGNOSIS — Z6841 Body Mass Index (BMI) 40.0 and over, adult: Secondary | ICD-10-CM

## 2017-05-25 DIAGNOSIS — Z818 Family history of other mental and behavioral disorders: Secondary | ICD-10-CM | POA: Diagnosis not present

## 2017-05-25 DIAGNOSIS — Z3490 Encounter for supervision of normal pregnancy, unspecified, unspecified trimester: Secondary | ICD-10-CM

## 2017-05-25 DIAGNOSIS — O26893 Other specified pregnancy related conditions, third trimester: Secondary | ICD-10-CM | POA: Diagnosis not present

## 2017-05-25 LAB — CBC
HCT: 40.2 % (ref 36.0–46.0)
Hemoglobin: 13.5 g/dL (ref 12.0–15.0)
MCH: 30.9 pg (ref 26.0–34.0)
MCHC: 33.6 g/dL (ref 30.0–36.0)
MCV: 92 fL (ref 78.0–100.0)
Platelets: 220 10*3/uL (ref 150–400)
RBC: 4.37 MIL/uL (ref 3.87–5.11)
RDW: 13.3 % (ref 11.5–15.5)
WBC: 13 10*3/uL — ABNORMAL HIGH (ref 4.0–10.5)

## 2017-05-25 LAB — TYPE AND SCREEN
ABO/RH(D): O POS
Antibody Screen: NEGATIVE

## 2017-05-25 LAB — MRSA PCR SCREENING: MRSA by PCR: NEGATIVE

## 2017-05-25 LAB — ABO/RH: ABO/RH(D): O POS

## 2017-05-25 MED ORDER — PHENYLEPHRINE 40 MCG/ML (10ML) SYRINGE FOR IV PUSH (FOR BLOOD PRESSURE SUPPORT): 80.0000 ug | PREFILLED_SYRINGE | INTRAVENOUS | Status: DC | PRN

## 2017-05-25 MED ORDER — EPHEDRINE 5 MG/ML INJ: 10.0000 mg | INTRAVENOUS | Status: DC | PRN

## 2017-05-25 MED ORDER — LACTATED RINGERS IV SOLN
INTRAVENOUS | Status: DC
  Administered 2017-05-25 (×3): via INTRAVENOUS

## 2017-05-25 MED ORDER — SOD CITRATE-CITRIC ACID 500-334 MG/5ML PO SOLN
30.0000 mL | ORAL | Status: DC | PRN
  Administered 2017-05-26: 30 mL via ORAL
  Filled 2017-05-25: qty 15

## 2017-05-25 MED ORDER — OXYTOCIN 40 UNITS IN LACTATED RINGERS INFUSION - SIMPLE MED
2.5000 [IU]/h | INTRAVENOUS | Status: DC
  Administered 2017-05-26: 2.5 [IU]/h via INTRAVENOUS

## 2017-05-25 MED ORDER — DIPHENHYDRAMINE HCL 50 MG/ML IJ SOLN: 12.5000 mg | INTRAMUSCULAR | Status: DC | PRN

## 2017-05-25 MED ORDER — PHENYLEPHRINE 40 MCG/ML (10ML) SYRINGE FOR IV PUSH (FOR BLOOD PRESSURE SUPPORT)
80.0000 ug | PREFILLED_SYRINGE | INTRAVENOUS | Status: DC | PRN
  Filled 2017-05-25: qty 10

## 2017-05-25 MED ORDER — OXYTOCIN 40 UNITS IN LACTATED RINGERS INFUSION - SIMPLE MED
1.0000 m[IU]/min | INTRAVENOUS | Status: DC
  Administered 2017-05-25: 2 m[IU]/min via INTRAVENOUS
  Filled 2017-05-25: qty 1000

## 2017-05-25 MED ORDER — LIDOCAINE HCL (PF) 1 % IJ SOLN
30.0000 mL | INTRAMUSCULAR | Status: DC | PRN
  Filled 2017-05-25: qty 30

## 2017-05-25 MED ORDER — TERBUTALINE SULFATE 1 MG/ML IJ SOLN: 0.2500 mg | Freq: Once | INTRAMUSCULAR | Status: DC | PRN

## 2017-05-25 MED ORDER — ONDANSETRON HCL 4 MG/2ML IJ SOLN: 4.0000 mg | Freq: Four times a day (QID) | INTRAMUSCULAR | Status: DC | PRN

## 2017-05-25 MED ORDER — OXYTOCIN BOLUS FROM INFUSION: 500.0000 mL | Freq: Once | INTRAVENOUS | Status: DC

## 2017-05-25 MED ORDER — FENTANYL 2.5 MCG/ML BUPIVACAINE 1/10 % EPIDURAL INFUSION (WH - ANES)
14.0000 mL/h | INTRAMUSCULAR | Status: DC | PRN
  Administered 2017-05-25 (×2): 14 mL/h via EPIDURAL
  Filled 2017-05-25 (×2): qty 100

## 2017-05-25 MED ORDER — ACETAMINOPHEN 325 MG PO TABS: 650.0000 mg | ORAL_TABLET | ORAL | Status: DC | PRN

## 2017-05-25 MED ORDER — LACTATED RINGERS IV SOLN: 500.0000 mL | Freq: Once | INTRAVENOUS | Status: DC

## 2017-05-25 MED ORDER — LACTATED RINGERS IV SOLN: 500.0000 mL | INTRAVENOUS | Status: DC | PRN

## 2017-05-25 MED ORDER — FLEET ENEMA 7-19 GM/118ML RE ENEM: 1.0000 | ENEMA | RECTAL | Status: DC | PRN

## 2017-05-25 MED ORDER — LIDOCAINE HCL (PF) 1 % IJ SOLN
INTRAMUSCULAR | Status: DC | PRN
  Administered 2017-05-25 (×2): 7 mL via EPIDURAL

## 2017-05-25 NOTE — Progress Notes (Signed)
Comfortable.  Not feeling pressure SVE c/c/+2 Start pushing in 45 min.   Linda Hedges, DO

## 2017-05-25 NOTE — Progress Notes (Signed)
Difficulty monitoring CTX. CVX 4/60/-2, IUPC placed. Continue pitocin.  Linda Hedges, DO

## 2017-05-25 NOTE — Anesthesia Pain Management Evaluation Note (Signed)
  CRNA Pain Management Visit Note  Patient: Alejandra Barr, 31 y.o., female  "Hello I am a member of the anesthesia team at [facility]. We have an anesthesia team available at all times to provide care throughout the hospital, including epidural management and anesthesia for C-section. I don't know your plan for the delivery whether it a natural birth, water birth, IV sedation, nitrous supplementation, doula or epidural, but we want to meet your pain goals."   1.Was your pain managed to your expectations on prior hospitalizations?   No prior hospitalizations  2.What is your expectation for pain management during this hospitalization?     Epidural  3.How can we help you reach that goal? unsure  Record the patient's initial score and the patient's pain goal.   Pain: 0  Pain Goal: 5 The [facility] wants you to be able to say your pain was always managed very well.  Casimer Lanius 05/25/2017

## 2017-05-25 NOTE — Anesthesia Preprocedure Evaluation (Signed)
Anesthesia Evaluation  Patient identified by MRN, date of birth, ID band Patient awake    Reviewed: Allergy & Precautions, NPO status , Patient's Chart, lab work & pertinent test results  Airway Mallampati: II       Dental no notable dental hx. (+) Teeth Intact   Pulmonary former smoker,    Pulmonary exam normal breath sounds clear to auscultation       Cardiovascular Normal cardiovascular exam Rhythm:Regular Rate:Normal     Neuro/Psych    GI/Hepatic negative GI ROS, Neg liver ROS,   Endo/Other  Morbid obesity  Renal/GU negative Renal ROS     Musculoskeletal negative musculoskeletal ROS (+)   Abdominal (+) + obese,   Peds  Hematology   Anesthesia Other Findings   Reproductive/Obstetrics (+) Pregnancy                             Anesthesia Physical Anesthesia Plan  ASA: III  Anesthesia Plan: Epidural   Post-op Pain Management:    Induction:   PONV Risk Score and Plan:   Airway Management Planned:   Additional Equipment:   Intra-op Plan:   Post-operative Plan:   Informed Consent: I have reviewed the patients History and Physical, chart, labs and discussed the procedure including the risks, benefits and alternatives for the proposed anesthesia with the patient or authorized representative who has indicated his/her understanding and acceptance.     Plan Discussed with:   Anesthesia Plan Comments:         Anesthesia Quick Evaluation

## 2017-05-25 NOTE — Anesthesia Procedure Notes (Signed)
Epidural Patient location during procedure: OB Start time: 05/25/2017 3:06 PM End time: 05/25/2017 3:10 PM  Staffing Anesthesiologist: Lyn Hollingshead Performed: anesthesiologist   Preanesthetic Checklist Completed: patient identified, surgical consent, pre-op evaluation, timeout performed, IV checked, risks and benefits discussed and monitors and equipment checked  Epidural Patient position: sitting Prep: site prepped and draped and DuraPrep Patient monitoring: continuous pulse ox and blood pressure Approach: midline Location: L3-L4 Injection technique: LOR air  Needle:  Needle type: Tuohy  Needle gauge: 17 G Needle length: 9 cm and 9 Needle insertion depth: 7 cm Catheter type: closed end flexible Catheter size: 19 Gauge Catheter at skin depth: 12 cm Test dose: negative and Other  Assessment Sensory level: T9 Events: blood not aspirated, injection not painful, no injection resistance, negative IV test and no paresthesia  Additional Notes Reason for block:procedure for pain

## 2017-05-25 NOTE — H&P (Addendum)
Alejandra Barr is a 31 y.o. female presenting for elective IOL at [redacted]w[redacted]d with IVF pregnancy.  She is s/p gastric bypass (Roux-en-Y) and followed by HemOnc for anemia.  She has had cardiac ablation; on Toprol this pregnancy; nl growth u/s 7/3 (EFW 7#9).  The patient has know succenturiate lobe.  GBS negative.  OB History    Gravida Para Term Preterm AB Living   1             SAB TAB Ectopic Multiple Live Births                 Past Medical History:  Diagnosis Date  . Asthma   . Atrial flutter ([place])    Status post RFA by Dr. Lovena Le in 2004  . Chronic anxiety   . Chronic depression   . 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 Iron deficiency   . Iron deficiency anemia 10/02/2010   Qualifier: Diagnosis of  By: Moshe Cipro MD, Joycelyn Schmid    . Low vitamin B12 level 07/10/2013   Overview:  Last Assessment & Plan:  Being treated through hematology  . Newborn product of in vitro fertilization (IVF) pregnancy   . Obesity    Past Surgical History:  Procedure Laterality Date  . Bilateral eustachian tube placement on 4 different occasions    . CARDIAC ELECTROPHYSIOLOGY MAPPING AND ABLATION    . CHOLECYSTECTOMY  2003  . COLONOSCOPY N/A 11/29/2013   Procedure: COLONOSCOPY;  Surgeon: Rogene Houston, MD;  Location[facility];  Service: Endoscopy;  Laterality: N/A;  120-rescheduled to 300 Ann notified pt  . GASTRIC BYPASS  2006  . TONSILLECTOMY  1990   Family History: family history includes Arrhythmia in her mother; Breast cancer in her mother; Diabetes in her maternal grandmother; Fibromyalgia in her mother; Heart defect in her mother; Heart disease in her paternal grandfather and paternal grandmother; Hypertension in her maternal grandfather, maternal grandmother, paternal grandfather, and paternal grandmother; Hypothyroidism in her mother; Obesity in her mother. Social History:  reports that she quit smoking about 1 years ago. Her smoking use included Cigarettes. She started smoking about 4 years ago.  She smoked 0.50 packs per day. She has never used smokeless tobacco. She reports that she does not drink alcohol or use drugs.     Maternal Diabetes: No Genetic Screening: Normal Maternal Ultrasounds/Referrals: Normal Fetal Ultrasounds or other Referrals:  Fetal echo wnl Maternal Substance Abuse:  No Significant Maternal Medications:  Meds include: Other: Toprol Significant Maternal Lab Results:  Lab values include: Group B Strep negative Other Comments:  None  ROS Maternal Medical History:  Fetal activity: Perceived fetal activity is normal.   Last perceived fetal movement was within the past hour.    Prenatal complications: no prenatal complications Prenatal Complications - Diabetes: none.    Dilation: 3 Effacement (%): 50 Station: -2 Exam by:: Ridhaan Dreibelbis Blood pressure 106/62, pulse 96, temperature 99 F (37.2 C), temperature source Oral, resp. rate 16, height 5\' 2"  (1.575 m), weight 244 lb (110.7 kg). Maternal Exam:  Uterine Assessment: Contraction strength is mild.  Contraction frequency is regular.   Abdomen: Patient reports no abdominal tenderness. Fundal height is c/w dates.   Estimated fetal weight is 8#8.   Fetal presentation: vertex  Introitus: Normal vulva. Pelvis: adequate for delivery.   Cervix: Cervix evaluated by digital exam.     Physical Exam  Constitutional: She is oriented to person, place, and time. She appears well-developed and well-nourished.  GI: Soft. There is  no rebound and no guarding.  Neurological: She is alert and oriented to person, place, and time.  Skin: Skin is warm and dry.  Psychiatric: She has a normal mood and affect. Her behavior is normal.    Prenatal labs: ABO, Rh: O/Positive/-- (12/20 0000) Antibody: Negative (12/20 0000) Rubella: Immune (12/20 0000) RPR: Nonreactive (12/20 0000)  HBsAg: Negative (12/20 0000)  HIV: Non-reactive (12/20 0000)  GBS: Negative (07/16 0000)   Assessment/Plan: 31yo G1 at [redacted]w[redacted]d for  IOL -Pitocin -AROM when able -Epidural when ready -Anticipate NSVD   Jadier Rockers 05/25/2017, 8:12 AM

## 2017-05-26 ENCOUNTER — Encounter (HOSPITAL_COMMUNITY)
Admission: RE | Disposition: A | Discharge status: Home / Self Care | Payer: Self-pay | Source: Ambulatory Visit | Attending: Obstetrics & Gynecology

## 2017-05-26 ENCOUNTER — Encounter (HOSPITAL_COMMUNITY): Payer: Self-pay

## 2017-05-26 DIAGNOSIS — Z98891 History of uterine scar from previous surgery: Secondary | ICD-10-CM

## 2017-05-26 LAB — CBC
HCT: 30.6 % — ABNORMAL LOW (ref 36.0–46.0)
Hemoglobin: 10.5 g/dL — ABNORMAL LOW (ref 12.0–15.0)
MCH: 31.6 pg (ref 26.0–34.0)
MCHC: 34.3 g/dL (ref 30.0–36.0)
MCV: 92.2 fL (ref 78.0–100.0)
Platelets: 200 10*3/uL (ref 150–400)
RBC: 3.32 MIL/uL — ABNORMAL LOW (ref 3.87–5.11)
RDW: 13.3 % (ref 11.5–15.5)
WBC: 22.3 10*3/uL — ABNORMAL HIGH (ref 4.0–10.5)

## 2017-05-26 LAB — RPR: RPR Ser Ql: NONREACTIVE

## 2017-05-26 SURGERY — Surgical Case
Anesthesia: Epidural

## 2017-05-26 MED ORDER — METOPROLOL SUCCINATE ER 25 MG PO TB24
25.0000 mg | ORAL_TABLET | Freq: Every day | ORAL | Status: DC
  Administered 2017-05-26 – 2017-05-29 (×4): 25 mg via ORAL
  Filled 2017-05-26 (×6): qty 1

## 2017-05-26 MED ORDER — COCONUT OIL OIL
1.0000 "application " | TOPICAL_OIL | Status: DC | PRN
  Administered 2017-05-27: 1 via TOPICAL
  Filled 2017-05-26: qty 120

## 2017-05-26 MED ORDER — SCOPOLAMINE 1 MG/3DAYS TD PT72
MEDICATED_PATCH | TRANSDERMAL | Status: AC
Start: 2017-05-26 — End: ?
  Filled 2017-05-26: qty 1

## 2017-05-26 MED ORDER — SODIUM BICARBONATE 8.4 % IV SOLN
INTRAVENOUS | Status: DC | PRN
  Administered 2017-05-26 (×2): 5 mL via EPIDURAL

## 2017-05-26 MED ORDER — LACTATED RINGERS IV SOLN
INTRAVENOUS | Status: DC | PRN
  Administered 2017-05-26 (×3): via INTRAVENOUS

## 2017-05-26 MED ORDER — OXYTOCIN 10 UNIT/ML IJ SOLN
INTRAMUSCULAR | Status: AC
  Filled 2017-05-26: qty 4

## 2017-05-26 MED ORDER — IBUPROFEN 600 MG PO TABS
600.0000 mg | ORAL_TABLET | Freq: Four times a day (QID) | ORAL | Status: DC
  Administered 2017-05-26 – 2017-05-29 (×14): 600 mg via ORAL
  Filled 2017-05-26 (×14): qty 1

## 2017-05-26 MED ORDER — MEPERIDINE HCL 25 MG/ML IJ SOLN
INTRAMUSCULAR | Status: DC | PRN
  Administered 2017-05-26 (×2): 12.5 mg via INTRAVENOUS

## 2017-05-26 MED ORDER — PRENATAL MULTIVITAMIN CH
1.0000 | ORAL_TABLET | Freq: Every day | ORAL | Status: DC
  Administered 2017-05-26 – 2017-05-29 (×4): 1 via ORAL
  Filled 2017-05-26 (×4): qty 1

## 2017-05-26 MED ORDER — CLINDAMYCIN PHOSPHATE 900 MG/50ML IV SOLN: 900.0000 mg | Freq: Once | INTRAVENOUS | Status: DC

## 2017-05-26 MED ORDER — DIPHENHYDRAMINE HCL 25 MG PO CAPS: 25.0000 mg | ORAL_CAPSULE | Freq: Four times a day (QID) | ORAL | Status: DC | PRN

## 2017-05-26 MED ORDER — MEPERIDINE HCL 50 MG PO TABS
50.0000 mg | ORAL_TABLET | ORAL | Status: DC | PRN
  Administered 2017-05-26 – 2017-05-29 (×9): 50 mg via ORAL
  Filled 2017-05-26 (×10): qty 1

## 2017-05-26 MED ORDER — FENTANYL CITRATE (PF) 100 MCG/2ML IJ SOLN
25.0000 ug | INTRAMUSCULAR | Status: DC | PRN
  Administered 2017-05-26: 25 ug via INTRAVENOUS
  Administered 2017-05-26 (×2): 50 ug via INTRAVENOUS

## 2017-05-26 MED ORDER — SIMETHICONE 80 MG PO CHEW
80.0000 mg | CHEWABLE_TABLET | ORAL | Status: DC
Start: 2017-05-27 — End: 2017-05-29
  Administered 2017-05-26 – 2017-05-28 (×3): 80 mg via ORAL
  Filled 2017-05-26 (×3): qty 1

## 2017-05-26 MED ORDER — LACTATED RINGERS IV SOLN
INTRAVENOUS | Status: DC
  Administered 2017-05-26: 08:00:00 via INTRAVENOUS

## 2017-05-26 MED ORDER — DIBUCAINE 1 % RE OINT: 1.0000 "application " | TOPICAL_OINTMENT | RECTAL | Status: DC | PRN

## 2017-05-26 MED ORDER — PHENYLEPHRINE HCL 10 MG/ML IJ SOLN
INTRAMUSCULAR | Status: DC | PRN
  Administered 2017-05-26 (×10): 80 ug via INTRAVENOUS
  Administered 2017-05-26: 40 ug via INTRAVENOUS
  Administered 2017-05-26: 80 ug via INTRAVENOUS

## 2017-05-26 MED ORDER — DEXAMETHASONE SODIUM PHOSPHATE 4 MG/ML IJ SOLN
INTRAMUSCULAR | Status: DC | PRN
  Administered 2017-05-26: 10 mg via INTRAVENOUS

## 2017-05-26 MED ORDER — GENTAMICIN SULFATE 40 MG/ML IJ SOLN: 2.0000 mg/kg | Freq: Once | INTRAVENOUS | Status: DC

## 2017-05-26 MED ORDER — OXYTOCIN 40 UNITS IN LACTATED RINGERS INFUSION - SIMPLE MED
INTRAVENOUS | Status: AC
  Filled 2017-05-26: qty 1000

## 2017-05-26 MED ORDER — SODIUM CHLORIDE 0.9 % IJ SOLN
INTRAMUSCULAR | Status: AC
Start: 2017-05-26 — End: ?
  Filled 2017-05-26: qty 10

## 2017-05-26 MED ORDER — SCOPOLAMINE 1 MG/3DAYS TD PT72
MEDICATED_PATCH | TRANSDERMAL | Status: DC | PRN
  Administered 2017-05-26: 1 via TRANSDERMAL

## 2017-05-26 MED ORDER — ONDANSETRON HCL 4 MG/2ML IJ SOLN
INTRAMUSCULAR | Status: DC | PRN
  Administered 2017-05-26: 4 mg via INTRAVENOUS

## 2017-05-26 MED ORDER — SIMETHICONE 80 MG PO CHEW: 80.0000 mg | CHEWABLE_TABLET | ORAL | Status: DC | PRN

## 2017-05-26 MED ORDER — DEXAMETHASONE SODIUM PHOSPHATE 10 MG/ML IJ SOLN
INTRAMUSCULAR | Status: AC
  Filled 2017-05-26: qty 1

## 2017-05-26 MED ORDER — ACETAMINOPHEN 325 MG PO TABS
650.0000 mg | ORAL_TABLET | ORAL | Status: DC | PRN
  Administered 2017-05-26 – 2017-05-27 (×3): 650 mg via ORAL
  Filled 2017-05-26 (×3): qty 2

## 2017-05-26 MED ORDER — SIMETHICONE 80 MG PO CHEW
80.0000 mg | CHEWABLE_TABLET | Freq: Three times a day (TID) | ORAL | Status: DC
  Administered 2017-05-26 – 2017-05-29 (×11): 80 mg via ORAL
  Filled 2017-05-26 (×11): qty 1

## 2017-05-26 MED ORDER — ONDANSETRON HCL 4 MG/2ML IJ SOLN
INTRAMUSCULAR | Status: AC
  Filled 2017-05-26: qty 2

## 2017-05-26 MED ORDER — TETANUS-DIPHTH-ACELL PERTUSSIS 5-2.5-18.5 LF-MCG/0.5 IM SUSP
0.5000 mL | Freq: Once | INTRAMUSCULAR | Status: DC
Start: 2017-05-27 — End: 2017-05-29

## 2017-05-26 MED ORDER — MEPERIDINE HCL 25 MG/ML IJ SOLN
INTRAMUSCULAR | Status: AC
  Filled 2017-05-26: qty 1

## 2017-05-26 MED ORDER — PHENYLEPHRINE 40 MCG/ML (10ML) SYRINGE FOR IV PUSH (FOR BLOOD PRESSURE SUPPORT)
PREFILLED_SYRINGE | INTRAVENOUS | Status: AC
  Filled 2017-05-26: qty 20

## 2017-05-26 MED ORDER — ZOLPIDEM TARTRATE 5 MG PO TABS: 5.0000 mg | ORAL_TABLET | Freq: Every evening | ORAL | Status: DC | PRN

## 2017-05-26 MED ORDER — OXYTOCIN 40 UNITS IN LACTATED RINGERS INFUSION - SIMPLE MED: 2.5000 [IU]/h | INTRAVENOUS | Status: AC

## 2017-05-26 MED ORDER — FENTANYL CITRATE (PF) 100 MCG/2ML IJ SOLN
INTRAMUSCULAR | Status: AC
  Administered 2017-05-26: 100 ug
  Filled 2017-05-26: qty 2

## 2017-05-26 MED ORDER — MENTHOL 3 MG MT LOZG: 1.0000 | LOZENGE | OROMUCOSAL | Status: DC | PRN

## 2017-05-26 MED ORDER — FENTANYL CITRATE (PF) 100 MCG/2ML IJ SOLN
INTRAMUSCULAR | Status: AC
  Filled 2017-05-26: qty 2

## 2017-05-26 MED ORDER — SENNOSIDES-DOCUSATE SODIUM 8.6-50 MG PO TABS
2.0000 | ORAL_TABLET | ORAL | Status: DC
  Administered 2017-05-26 – 2017-05-28 (×3): 2 via ORAL
  Filled 2017-05-26 (×3): qty 2

## 2017-05-26 MED ORDER — GENTAMICIN SULFATE 40 MG/ML IJ SOLN
Freq: Once | INTRAVENOUS | Status: AC
  Administered 2017-05-26: 115.25 mL via INTRAVENOUS
  Filled 2017-05-26: qty 9.25

## 2017-05-26 MED ORDER — FENTANYL CITRATE (PF) 100 MCG/2ML IJ SOLN: 100.0000 ug | Freq: Once | INTRAMUSCULAR | Status: DC

## 2017-05-26 MED ORDER — WITCH HAZEL-GLYCERIN EX PADS: 1.0000 "application " | MEDICATED_PAD | CUTANEOUS | Status: DC | PRN

## 2017-05-26 MED ORDER — LIDOCAINE-EPINEPHRINE (PF) 2 %-1:200000 IJ SOLN
INTRAMUSCULAR | Status: DC | PRN
  Administered 2017-05-26: 5 mL via INTRADERMAL

## 2017-05-26 MED ORDER — LACTATED RINGERS IV SOLN
INTRAVENOUS | Status: DC | PRN
  Administered 2017-05-26: 40 [IU] via INTRAVENOUS

## 2017-05-26 SURGICAL SUPPLY — 37 items
BENZOIN TINCTURE PRP APPL 2/3 (GAUZE/BANDAGES/DRESSINGS) ×2 IMPLANT
CHLORAPREP W/TINT 26ML (MISCELLANEOUS) ×2 IMPLANT
CLAMP CORD UMBIL (MISCELLANEOUS) IMPLANT
CLOSURE STERI STRIP 1/2 X4 (GAUZE/BANDAGES/DRESSINGS) ×2 IMPLANT
CLOTH BEACON ORANGE TIMEOUT ST (SAFETY) ×2 IMPLANT
DERMABOND ADVANCED (GAUZE/BANDAGES/DRESSINGS)
DERMABOND ADVANCED .7 DNX12 (GAUZE/BANDAGES/DRESSINGS) IMPLANT
DRSG OPSITE POSTOP 4X10 (GAUZE/BANDAGES/DRESSINGS) ×2 IMPLANT
ELECT REM PT RETURN 9FT ADLT (ELECTROSURGICAL) ×2
ELECTRODE REM PT RTRN 9FT ADLT (ELECTROSURGICAL) ×1 IMPLANT
EXTRACTOR VACUUM KIWI (MISCELLANEOUS) IMPLANT
GLOVE BIO SURGEON STRL SZ 6 (GLOVE) ×2 IMPLANT
GLOVE BIOGEL PI IND STRL 6 (GLOVE) ×2 IMPLANT
GLOVE BIOGEL PI IND STRL 7.0 (GLOVE) ×1 IMPLANT
GLOVE BIOGEL PI INDICATOR 6 (GLOVE) ×2
GLOVE BIOGEL PI INDICATOR 7.0 (GLOVE) ×1
GOWN STRL REUS W/TWL LRG LVL3 (GOWN DISPOSABLE) ×4 IMPLANT
KIT ABG SYR 3ML LUER SLIP (SYRINGE) ×2 IMPLANT
NEEDLE HYPO 25X5/8 SAFETYGLIDE (NEEDLE) ×2 IMPLANT
NS IRRIG 1000ML POUR BTL (IV SOLUTION) ×2 IMPLANT
PACK C SECTION WH (CUSTOM PROCEDURE TRAY) ×2 IMPLANT
PAD OB MATERNITY 4.3X12.25 (PERSONAL CARE ITEMS) ×2 IMPLANT
PENCIL SMOKE EVAC W/HOLSTER (ELECTROSURGICAL) ×2 IMPLANT
STRIP CLOSURE SKIN 1/2X4 (GAUZE/BANDAGES/DRESSINGS) ×2 IMPLANT
SUT CHROMIC 0 CTX 36 (SUTURE) ×6 IMPLANT
SUT CHROMIC 2 0 SH (SUTURE) ×2 IMPLANT
SUT MON AB 2-0 CT1 27 (SUTURE) ×2 IMPLANT
SUT PDS AB 0 CT1 27 (SUTURE) IMPLANT
SUT PLAIN 0 NONE (SUTURE) IMPLANT
SUT PLAIN 2 0 (SUTURE) ×1
SUT PLAIN ABS 2-0 CT1 27XMFL (SUTURE) ×1 IMPLANT
SUT VIC AB 0 CT1 27 (SUTURE) ×1
SUT VIC AB 0 CT1 27XBRD ANBCTR (SUTURE) ×1 IMPLANT
SUT VIC AB 0 CT1 36 (SUTURE) IMPLANT
SUT VIC AB 4-0 KS 27 (SUTURE) ×2 IMPLANT
TOWEL OR 17X24 6PK STRL BLUE (TOWEL DISPOSABLE) ×2 IMPLANT
TRAY FOLEY BAG SILVER LF 14FR (SET/KITS/TRAYS/PACK) IMPLANT

## 2017-05-26 NOTE — Consult Note (Signed)
[facility]  Delivery Note:  C-section       05/26/2017  1:30 AM  I was called to the operating room at the request of the patient's obstetrician (Dr. Percell Miller) for a repeat c-section.  PRENATAL HX:  This is a 31 y/o G1P0 at 7 and 5/[redacted] weeks gestation who was admitted for elective IOL.  Her pregnancy has been complicated by IVF conception and she is s/p gastric bypass surgery.  She is also on Toporol for history of cardiac ablation.  She is GBS negative with ROM x 24 hours.  C-section for failure to descend followed by failed vacuum.   DELIVERY:  Infant was vigorous at delivery, requiring no resuscitation other than standard warming, drying and stimulation.  APGARs 9 and 9.  Exam within normal limits.  After 5 minutes, baby left with nurse to assist parents with skin-to-skin care.   _____________________ Electronically Signed By: Clinton Gallant, MD Neonatologist

## 2017-05-26 NOTE — Transfer of Care (Signed)
Immediate Anesthesia Transfer of Care Note  Patient: Alejandra Barr  Procedure(s) Performed: Procedure(s): CESAREAN SECTION (N/A)  Patient Location: PACU  Anesthesia Type:Regional  Level of Consciousness: awake, alert  and oriented  Airway & Oxygen Therapy: Patient Spontanous Breathing  Post-op Assessment: Report given to RN and Post -op Vital signs reviewed and stable  Post vital signs: Reviewed and stable  Last Vitals:  Vitals:   05/26/17 0101 05/26/17 0107  BP: (!) 94/58 105/68  Pulse: (!) 127 (!) 129  Resp:    Temp:      Last Pain:  Vitals:   05/25/17 2325  TempSrc: Oral  PainSc:          Complications: No apparent anesthesia complications

## 2017-05-26 NOTE — Progress Notes (Signed)
Subjective: Postpartum Day 0: Cesarean Delivery Patient reports incisional pain.    Objective: Vital signs in last 24 hours: Temp:  [98.4 F (36.9 C)-99.7 F (37.6 C)] 99.7 F (37.6 C) (07/25 0550) Pulse Rate:  [79-129] 108 (07/25 0550) Resp:  [9-20] 16 (07/25 0550) BP: (94-128)/(53-80) 108/65 (07/25 0550) SpO2:  [79 %-100 %] 97 % (07/25 0405)  Physical Exam:  General: alert, cooperative and no distress Lochia: appropriate Uterine Fundus: firm Incision: healing well DVT Evaluation: No evidence of DVT seen on physical exam.   Recent Labs  05/25/17 0735 05/26/17 0639  HGB 13.5 10.5*  HCT 40.2 30.6*    Assessment/Plan: Status post Cesarean section. Doing well postoperatively.  Continue current care.  Aleea Hendry II,Heidie Krall E 05/26/2017, 8:55 AM

## 2017-05-26 NOTE — Op Note (Signed)
[name] PROCEDURE DATE: 05/25/2017 - 05/26/2017  PREOPERATIVE DIAGNOSIS: Intrauterine pregnancy at  [redacted]w[redacted]d weeks gestation, arrest of descent, failed vacuum assisted delivery  POSTOPERATIVE DIAGNOSIS: The same plus deep transverse arrest  PROCEDURE:   Primary Low Transverse Cesarean Section, backfill of bladder with sterile milk  SURGEON:  Dr. Linda Hedges  INDICATIONS: Alejandra Barr is a 31 y.o. G1P0 at [redacted]w[redacted]d scheduled for cesarean section secondary to arrest of descent and failed vacuum assist.  The risks of cesarean section discussed with the patient included but were not limited to: bleeding which may require transfusion or reoperation; infection which may require antibiotics; injury to bowel, bladder, ureters or other surrounding organs; injury to the fetus; need for additional procedures including hysterectomy in the event of a life-threatening hemorrhage; placental abnormalities wth subsequent pregnancies, incisional problems, thromboembolic phenomenon and other postoperative/anesthesia complications. The patient concurred with the proposed plan, giving informed written consent for the procedure.    FINDINGS:  Viable female infant in cephalic presentation, APGARs 9,9:  Weight pending  Clear amniotic fluid.  Intact placenta, three vessel cord.  Grossly normal uterus, ovaries and fallopian tubes. .   ANESTHESIA:    Epidural ESTIMATED BLOOD LOSS: 800 ml SPECIMENS: Placenta sent to L&D COMPLICATIONS: None immediate  PROCEDURE IN DETAIL:  The patient received intravenous antibiotics and had sequential compression devices applied to her lower extremities while in the preoperative area.  She was then taken to the operating room where epidural anesthesia was dosed up to surgical level and was found to be adequate. She was then placed in a dorsal supine position with a leftward tilt, and prepped and draped in a sterile manner.  A foley catheter was placed into her bladder and attached to  constant gravity.  After an adequate timeout was performed, a Pfannenstiel skin incision was made with scalpel and carried through to the underlying layer of fascia. The fascia was incised in the midline and this incision was extended bilaterally using the Mayo scissors. Kocher clamps were applied to the superior aspect of the fascial incision and the underlying rectus muscles were dissected off bluntly. A similar process was carried out on the inferior aspect of the facial incision. The rectus muscles were separated in the midline bluntly and the peritoneum was entered bluntly.  Bladder flap was created sharply and developed bluntly.  Bladder blade was placed.  A transverse hysterotomy was made with a scalpel and extended bilaterally bluntly. The bladder blade was then removed. The infant was successfully delivered from LOT position, and cord was clamped and cut and infant was handed over to awaiting neonatology team. Uterine massage was then administered and the placenta delivered intact with three-vessel cord. The uterus was cleared of clot and debris.  Hysterotomy extension was noted on the left aspect.  Apex was grasped and starting at this end, the hysterotomy was closed with 0 chromic in a running locked fashion.  A second imbricating suture of 0-chromic was used to reinforce the incision and aid in hemostasis.  The bladder flap was reapproximated to the upper serosal edge with 2-0 chromic in a running fashion.  At this point, because of the hysterotomy extension and hematuria, the bladder was backfilled with 200cc sterile milk.  No spill was noted.  The peritoneum and rectus muscles were noted to be hemostatic and were reapproximated using 3-0 monocryl in a running fashion.  The fascia was closed with 0-Vicryl in a running fashion with good restoration of anatomy.  The subcutaneus tissue was  copiously irrigated and was reapproximated using three interrupted plain gut stitches.  The skin was closed with 4-0  vicryl in a subcuticular fashion.  Urine was noted to be clearing at the conclusion of the case.  Pt tolerated the procedure will.  All counts were correct x2.  Pt went to the recovery room in stable condition.

## 2017-05-26 NOTE — Anesthesia Postprocedure Evaluation (Signed)
Anesthesia Post Note  Patient: Alejandra Barr  Procedure(s) Performed: Procedure(s) (LRB): CESAREAN SECTION (N/A)     Patient location during evaluation: Mother Baby Anesthesia Type: Epidural Level of consciousness: awake Pain management: pain level controlled Vital Signs Assessment: post-procedure vital signs reviewed and stable Respiratory status: spontaneous breathing Cardiovascular status: stable Postop Assessment: no headache, no backache, epidural receding and patient able to bend at knees Anesthetic complications: no    Last Vitals:  Vitals:   05/26/17 0430 05/26/17 0550  BP: (!) 107/58 108/65  Pulse: (!) 112 (!) 108  Resp: 16 16  Temp: 37.3 C 37.6 C    Last Pain:  Vitals:   05/26/17 0650  TempSrc:   PainSc: 5    Pain Goal: Patients Stated Pain Goal: 4 (05/26/17 0300)               Everette Rank

## 2017-05-26 NOTE — Progress Notes (Addendum)
Patient has been effectively pushing x 1.5 hours.  Vertex a +3 station with no descent with pushing.  Patient is counseled for vacuum attempt including risk of cephalohematoma and brain bleed.  Patient accepts.  Kiwi Vacuum is applied and with three push/pull efforts for each of three contractions, no descent is noted; single pop off.  At this time, patient is counseled for recommendation to proceed to C/S.  She is informed of the risk of bleeding, infection, scarring, and damage to surrounding structures.  She is informed of the 1% risk of uterine rupture in subsequent pregnancies.  All questions were answered and we will proceed to C/S for arrest of descent.   Linda Hedges, DO

## 2017-05-26 NOTE — Anesthesia Postprocedure Evaluation (Signed)
Anesthesia Post Note  Patient: Alejandra Barr  Procedure(s) Performed: Procedure(s) (LRB): CESAREAN SECTION (N/A)     Patient location during evaluation: Mother Baby Anesthesia Type: Epidural Level of consciousness: awake and alert and oriented Pain management: pain level controlled Vital Signs Assessment: post-procedure vital signs reviewed and stable Respiratory status: spontaneous breathing and nonlabored ventilation Cardiovascular status: stable Postop Assessment: no headache, patient able to bend at knees, no backache, no signs of nausea or vomiting, epidural receding and adequate PO intake Anesthetic complications: no    Last Vitals:  Vitals:   05/26/17 0800 05/26/17 0900  BP: (!) 107/54 (!) 105/53  Pulse: 97 97  Resp: 18 18  Temp: 37.6 C 37.6 C    Last Pain:  Vitals:   05/26/17 0900  TempSrc: Oral  PainSc: 3    Pain Goal: Patients Stated Pain Goal: 4 (05/26/17 0900)               Goldie Dimmer Hristova

## 2017-05-27 LAB — CBC
HCT: 28.2 % — ABNORMAL LOW (ref 36.0–46.0)
Hemoglobin: 9.6 g/dL — ABNORMAL LOW (ref 12.0–15.0)
MCH: 31.4 pg (ref 26.0–34.0)
MCHC: 34 g/dL (ref 30.0–36.0)
MCV: 92.2 fL (ref 78.0–100.0)
Platelets: 195 10*3/uL (ref 150–400)
RBC: 3.06 MIL/uL — ABNORMAL LOW (ref 3.87–5.11)
RDW: 13.8 % (ref 11.5–15.5)
WBC: 18.6 10*3/uL — ABNORMAL HIGH (ref 4.0–10.5)

## 2017-05-27 LAB — BIRTH TISSUE RECOVERY COLLECTION (PLACENTA DONATION)

## 2017-05-27 NOTE — Progress Notes (Signed)
[name] was referred for history of depression.  Referral is screened out by Clinical Social Worker because none of the following criteria appear to apply and there are no reports impacting the pregnancy or her transition to the postpartum period.  CSW does not deem it clinically necessary to further investigate at this time.   -History of anxiety/depression during this pregnancy, or of post-partum depression - Diagnosis of anxiety and/or depression within last 3 years ([name] was dx in 2012 per [facility] clinical record H&P hx; however, [facility] records have no note of it and there are no current concerns noted of depression hx)  - History of depression due to pregnancy loss/loss of child  or -[name]'s symptoms are currently being treated with medication and/or therapy ([name] is on medication and doing well   Please contact the Clinical Social Worker if needs arise or upon [name] request.    Oda Cogan, MSW, [facility]  Office: [phone]

## 2017-05-27 NOTE — Progress Notes (Signed)
Nurse at bedside to speak with pt about breast feeding, use of breast pump, frequency of breast pumping, and when to expect mature milk to come in.  Sig other at bedside.  Pt instructed on positioning for cracked nipples and treatment for cracked nipples.  Pt vu.  LC to follow up with pt.

## 2017-05-27 NOTE — Lactation Note (Signed)
This note was copied from a baby's chart. Lactation Consultation Note Mom stated baby has been fussy most of day. Discussed cluster feeding usually starts closer to 24 hrs old. Baby has had a lot of stools and a couple of voids. Noticed baby's lips slightly dry. Oral mucosa moist. Baby constantly licking lips. Mom has tubular very soft breast w/flat nipple at bottle end of breast. Noted Rt. Breast red and raw looking. Lt. Areola had 2 "hickies" red bruises around outside of nipple.  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 Skin to breast loose. ? How much glandular breast tissue. Grandular tissue mainly at bottom end of breast at areola "tube sock". Asked mom if she has PCOS, mom stated the Dr. suspected but lab work was negative.   Mom's lip extremely dry and peeling. Asked mom if she was drinking. Mom stated yes, that since by-pass surgery she has a hard time staying well hydrated. Currently being monitored strict I&O d/t low bloody output.  Gave mom vasoline to moisten lips. Mom also has iron and B12 deficiency. Mom takes liquid iron daily as well as gets IV iron once a months and get Vit B12 injections.   Mom questioned LC if she would produce enough milk for the baby. Explained to mom its a hope and see situation. Mom has DEBP at home. Discussed post pumping to stimulate breast.  Hand expression w/glisten of colostrum, but baby had been cluster feeding. Baby does have some output. Discussed importance of monitoring adequate output.  Mom shown how to use DEBP & how to disassemble, clean, & reassemble parts. Mom knows to pump q3h for 15-20 min.   Mom encouraged to feed baby 8-12 times/24 hours and with feeding cues. Mom encouraged to waken baby for feeds. Supplementing feeding information sheet given and reviewed.  Educated on Newborn behavior and encouraged STS. Baby has very stuffy nose. Encouraged football hold. Mom had only been BF in cradle position. Taught mom "C" hold. Mom kept using "V" hold  which pulled breast tissue back from areola the way she was doing it.  Mom's nails long, encouraged to cut and file later. Baby had difficulty maintaining nipple. Unless breast tissue was t-cup hold, baby loose hold.  Assessed baby's suck w/gloved finger. Baby has tight jaws, tongue thrust at times, clamps tight w/tongue humped in back. Suck training massage to loose hold and relax tongue. Baby does has good mobility of tongue as to frequently sticking tongue out past lips. Baby aggressive at breast. Finger fed baby Alimentum w/curve tip syring. Taught mom for post BF supplementing. Baby didn't maintain lips around finger at times loosing suction.   Baby vacuum to attempt delivery, ended up c-section. Baby has bruising to of head. TCB elevated. Will have bili serum drawn w/PKU this am.  Discussed w/RN plan of supplementing.  [place] brochure given w/resources, support groups and [facility] services.  Patient Name: Alejandra Barr [place] [id number] Date: 05/27/2017 Reason for consult: Initial assessment   Maternal Data Has patient been taught Hand Expression?: Yes Does the patient have breastfeeding experience prior to this delivery?: No  Feeding Feeding Type: Formula Length of feed: 15 min  LATCH Score/Interventions Latch: Repeated attempts needed to sustain latch, nipple held in mouth throughout feeding, stimulation needed to elicit sucking reflex. Intervention(s): Skin to skin;Teach feeding cues;Waking techniques Intervention(s): Adjust position;Assist with latch;Breast massage;Breast compression  Audible Swallowing: None  Type of Nipple: Flat Intervention(s): Double electric pump  Comfort (Breast/Nipple): Filling, red/small blisters or bruises, mild/mod discomfort  Problem noted: Mild/Moderate discomfort Interventions (Mild/moderate discomfort): Hand massage;Hand expression;Post-pump  Hold (Positioning): Full assist, staff holds infant at breast Intervention(s): Breastfeeding basics  reviewed;Support Pillows;Position options;Skin to skin  LATCH Score: 3  Lactation Tools Discussed/Used Tools: Pump Breast pump type: Double-Electric Breast Pump Pump Review: Setup, frequency, and cleaning;Milk Storage Initiated by:: Allayne Stack RN IBCLC Date initiated:: 05/27/17   Consult Status Consult Status: Follow-up Date: 05/27/17 Follow-up type: In-patient    [place]essa Nishi, Elta Gu[place] 05/27/2017, 1:50 AM

## 2017-05-27 NOTE — Progress Notes (Signed)
Subjective: Postpartum Day 1: Cesarean Delivery Patient reports incisional pain, tolerating PO and + flatus.    Objective: Vital signs in last 24 hours: Temp:  [98.6 F (37 C)-99.2 F (37.3 C)] 98.6 F (37 C) (07/26 0620) Pulse Rate:  [87-120] 105 (07/26 0620) Resp:  [16-20] 16 (07/26 0620) BP: (94-103)/(52-55) 103/55 (07/26 0620) SpO2:  [98 %-100 %] 98 % (07/25 1815)  Physical Exam:  General: alert, cooperative, appears stated age and no distress Lochia: appropriate Uterine Fundus: firm Incision: healing well DVT Evaluation: No evidence of DVT seen on physical exam.   Recent Labs  05/26/17 0639 05/27/17 0514  HGB 10.5* 9.6*  HCT 30.6* 28.2*    Assessment/Plan: Status post Cesarean section. Doing well postoperatively.  circ today.  Deborah Dondero C 05/27/2017, 9:48 AM

## 2017-05-28 NOTE — Plan of Care (Signed)
Problem: Skin Integrity: Goal: Demonstration of wound healing without infection will improve Outcome: Progressing Patient's honeycomb dressing was changed last evening and dressing is intact without drainage.

## 2017-05-28 NOTE — Progress Notes (Signed)
Subjective: Postpartum Day 2: Cesarean Delivery Patient reports incisional pain, tolerating PO and no problems voiding.    Objective: Vital signs in last 24 hours: Temp:  [98 F (36.7 C)-98.1 F (36.7 C)] 98 F (36.7 C) (07/27 0700) Pulse Rate:  [89-110] 100 (07/27 0700) Resp:  [18-20] 20 (07/27 0700) BP: (94-103)/(52-67) 103/67 (07/27 0700)  Physical Exam:  General: alert and cooperative Lochia: appropriate Uterine Fundus: firm Incision: healing well, no significant drainage DVT Evaluation: No evidence of DVT seen on physical exam.   Recent Labs  05/26/17 0639 05/27/17 0514  HGB 10.5* 9.6*  HCT 30.6* 28.2*    Assessment/Plan: Status post Cesarean section. Doing well postoperatively.  Continue current care  Alejandra Barr 05/28/2017, 8:45 AM

## 2017-05-28 NOTE — Lactation Note (Signed)
This note was copied from a baby's chart. Lactation Consultation Note  Patient Name: Alejandra Barr [id number] Date: 05/28/2017 Reason for consult: Follow-up assessment   With this mom of a term baby, now 48 hours old. Mom has started pumping, and was advised to pump at least 8 times a day, every 3 hours, preferably after BF/bottle feeding Wyatt. Mom and dad have been syringe feeding him, and mom was fine with bottle feeding. I advised mom to increase the amount of formula to 30 ml's for now, and feed him at least every 3 hours, or more with cues. If Hulen Skains does not seem satisfied with 24 ml's, mom knows to allow him to take as much as he wants. Mom knows to call for help with latching. Mom has sucking bruises  on her nipples and areolas. I reviewed hand expression with mom, and also explained the baby needs to be latched beyond her nipple. Mom very receptive to teaching, and will call for help as needed.    Maternal Data    Feeding    LATCH Score/Interventions                      Lactation Tools Discussed/Used     Consult Status Consult Status: Follow-up Date: 05/29/17 Follow-up type: In-patient    Tonna Corner 05/28/2017, 10:41 AM

## 2017-05-29 MED ORDER — IBUPROFEN 600 MG PO TABS: 600.0000 mg | ORAL_TABLET | Freq: Four times a day (QID) | ORAL | 0 refills | Status: DC

## 2017-05-29 NOTE — Discharge Summary (Signed)
Obstetric Discharge Summary Reason for Admission: induction of labor Prenatal Procedures: ultrasound Intrapartum Procedures: cesarean: low cervical, transverse Postpartum Procedures: none Complications-Operative and Postpartum: none Hemoglobin  Date Value Ref Range Status  05/27/2017 9.6 (L) 12.0 - 15.0 g/dL Final   HCT  Date Value Ref Range Status  05/27/2017 28.2 (L) 36.0 - 46.0 % Final    Physical Exam:  General: alert and cooperative Lochia: appropriate Uterine Fundus: firm Incision: healing well, no significant drainage DVT Evaluation: No evidence of DVT seen on physical exam.  Discharge Diagnoses: Term Pregnancy-delivered  Discharge Information: Date: 05/29/2017 Activity: pelvic rest Diet: routine Medications: PNV and Ibuprofen Condition: stable Instructions: refer to practice specific booklet Discharge to: home   Newborn Data: Live born female  Birth Weight: 8 lb 5.3 oz (3780 g) APGAR: 9, 9  Home with mother.  Alejandra Barr 05/29/2017, 7:31 AM

## 2017-05-29 NOTE — Lactation Note (Signed)
This note was copied from a baby's chart. Lactation Consultation Note  Patient Name: Alejandra Barr [id number] Date: 05/29/2017 Reason for consult: Follow-up assessment   With this mom of a term baby, now 36 hours old. Mom has been pumping and reports she is expressing thick, yellow milk, just enough to finger feed the baby. I advised mom to continue with BF and pumping, and offering formula as supplement by bottle. Mom very receptive to teaching, and doing well with above plan.  Breast/engorgemetn care reviewed with mom, and she knows to call for lactation as needed/o/p consult prn.    Maternal Data    Feeding Feeding Type: Breast Fed  LATCH Score/Interventions Latch: Grasps breast easily, tongue down, lips flanged, rhythmical sucking. Intervention(s): Skin to skin;Teach feeding cues;Waking techniques Intervention(s): Adjust position;Assist with latch;Breast massage;Breast compression  Audible Swallowing: A few with stimulation Intervention(s): Skin to skin;Hand expression  Type of Nipple: Everted at rest and after stimulation  Comfort (Breast/Nipple): Filling, red/small blisters or bruises, mild/mod discomfort  Problem noted: Cracked, bleeding, blisters, bruises Interventions  (Cracked/bleeding/bruising/blister): Expressed breast milk to nipple;Double electric pump Interventions (Mild/moderate discomfort): Hand massage;Hand expression;Post-pump  Hold (Positioning): No assistance needed to correctly position infant at breast. Intervention(s): Skin to skin  LATCH Score: 8  Lactation Tools Discussed/Used     Consult Status Consult Status: Complete Follow-up type: Call as needed    Tonna Corner 05/29/2017, 9:26 AM

## 2017-06-03 ENCOUNTER — Encounter (HOSPITAL_COMMUNITY): Payer: BLUE CROSS/BLUE SHIELD | Attending: Oncology

## 2017-06-03 ENCOUNTER — Other Ambulatory Visit (HOSPITAL_COMMUNITY): Payer: Self-pay | Admitting: Adult Health

## 2017-06-03 DIAGNOSIS — D508 Other iron deficiency anemias: Secondary | ICD-10-CM | POA: Diagnosis not present

## 2017-06-03 LAB — CBC WITH DIFFERENTIAL/PLATELET
Basophils Absolute: 0.1 K/uL (ref 0.0–0.1)
Basophils Relative: 1 %
Eosinophils Absolute: 0.2 K/uL (ref 0.0–0.7)
Eosinophils Relative: 2 %
HCT: 28.7 % — ABNORMAL LOW (ref 36.0–46.0)
Hemoglobin: 9.6 g/dL — ABNORMAL LOW (ref 12.0–15.0)
Lymphocytes Relative: 16 %
Lymphs Abs: 1.8 K/uL (ref 0.7–4.0)
MCH: 30.8 pg (ref 26.0–34.0)
MCHC: 33.4 g/dL (ref 30.0–36.0)
MCV: 92 fL (ref 78.0–100.0)
Monocytes Absolute: 0.4 K/uL (ref 0.1–1.0)
Monocytes Relative: 4 %
Neutro Abs: 8.5 K/uL — ABNORMAL HIGH (ref 1.7–7.7)
Neutrophils Relative %: 78 %
Platelets: 387 K/uL (ref 150–400)
RBC: 3.12 MIL/uL — ABNORMAL LOW (ref 3.87–5.11)
RDW: 12.9 % (ref 11.5–15.5)
WBC: 10.9 K/uL — ABNORMAL HIGH (ref 4.0–10.5)

## 2017-06-03 LAB — FERRITIN: Ferritin: 63 ng/mL (ref 11–307)

## 2017-06-03 LAB — IRON AND TIBC
Iron: 26 ug/dL — ABNORMAL LOW (ref 28–170)
Saturation Ratios: 8 % — ABNORMAL LOW (ref 10.4–31.8)
TIBC: 309 ug/dL (ref 250–450)
UIBC: 283 ug/dL

## 2017-06-04 DIAGNOSIS — D649 Anemia, unspecified: Secondary | ICD-10-CM | POA: Diagnosis not present

## 2017-06-08 ENCOUNTER — Other Ambulatory Visit (HOSPITAL_COMMUNITY): Payer: BLUE CROSS/BLUE SHIELD

## 2017-06-09 ENCOUNTER — Encounter (HOSPITAL_COMMUNITY): Payer: BLUE CROSS/BLUE SHIELD | Attending: Oncology

## 2017-06-09 ENCOUNTER — Encounter (HOSPITAL_COMMUNITY): Payer: Self-pay

## 2017-06-09 VITALS — BP 99/54 | HR 73 | Temp 99.1°F | Resp 18

## 2017-06-09 DIAGNOSIS — D509 Iron deficiency anemia, unspecified: Secondary | ICD-10-CM

## 2017-06-09 DIAGNOSIS — D508 Other iron deficiency anemias: Secondary | ICD-10-CM

## 2017-06-09 MED ORDER — SODIUM CHLORIDE 0.9% FLUSH
3.0000 mL | Freq: Once | INTRAVENOUS | Status: AC | PRN
  Administered 2017-06-09: 3 mL via INTRAVENOUS

## 2017-06-09 MED ORDER — SODIUM CHLORIDE 0.9 % IV SOLN
Freq: Once | INTRAVENOUS | Status: AC
  Administered 2017-06-09: 500 mL via INTRAVENOUS

## 2017-06-09 MED ORDER — SODIUM CHLORIDE 0.9 % IV SOLN
510.0000 mg | Freq: Once | INTRAVENOUS | Status: AC
  Administered 2017-06-09: 510 mg via INTRAVENOUS
  Filled 2017-06-09: qty 17

## 2017-06-10 NOTE — Patient Instructions (Signed)
[facility] Discharge Instructions  RECOMMENDATIONS MADE BY THE CONSULTANT AND ANY TEST RESULTS WILL BE SENT TO YOUR REFERRING PHYSICIAN.  Received Feraheme infusion today.Follow-up as scheduled. Call clinic for any questions or concerns  Thank you for choosing [facility] to provide your oncology and hematology care.  To afford each patient quality time with our provider, please arrive at least 15 minutes before your scheduled appointment time.    If you have a lab appointment with the Cancer Center please come in thru the  Main Entrance and check in at the main information desk  You need to re-schedule your appointment should you arrive 10 or more minutes late.  We strive to give you quality time with our providers, and arriving late affects you and other patients whose appointments are after yours.  Also, if you no show three or more times for appointments you may be dismissed from the clinic at the providers discretion.     Again, thank you for choosing [facility].  Our hope is that these requests will decrease the amount of time that you wait before being seen by our physicians.       _____________________________________________________________  Should you have questions after your visit to  Cancer Center, please contact our office at [phone] between the hours of 8:30 a.m. and 4:30 p.m.  Voicemails left after 4:30 p.m. will not be returned until the following business day.  For prescription refill requests, have your pharmacy contact our office.       Resources For Cancer Patients and their Caregivers ? American Cancer Society: Can assist with transportation, wigs, general needs, runs Look Good Feel Better.        1-888-227-6333 ? Cancer Care: Provides financial assistance, online support groups, medication/co-pay assistance.  1-800-813-HOPE (4673) ? [facility] Assists [place] cancer patients and their families through emotional , educational and financial support.  336-427-4357 ? [place] DSS Where to apply for food stamps, Medicaid and utility assistance. 336-342-1394 ? [facility]: Transportation to medical appointments. 336-347-2287 ? Social Security Administration: May apply for disability if have a Stage IV cancer. 336-342-7796 1-800-772-1213 ? [place] Aging, Disability and Transit Services: Assists with nutrition, care and transit needs. 336-349-2343  Cancer Center Support Programs: @10RELATIVEDAYS@ > Cancer Support Group  2nd Tuesday of the month 1pm-2pm, Journey Room  > Creative Journey  3rd Tuesday of the month 1130am-1pm, Journey Room  > Look Good Feel Better  1st Wednesday of the month 10am-12 noon, Journey Room (Call American Cancer Society to register 1-800-395-5775)   

## 2017-06-10 NOTE — Progress Notes (Signed)
Alejandra Barr tolerated Feraheme infusion well without complaints or incident. VSS upon discharge. Pt discharged self ambulatory in satisfactory condition 

## 2017-06-16 ENCOUNTER — Encounter (HOSPITAL_COMMUNITY): Payer: BLUE CROSS/BLUE SHIELD | Attending: Adult Health

## 2017-06-16 ENCOUNTER — Encounter (HOSPITAL_COMMUNITY): Payer: Self-pay

## 2017-06-16 VITALS — BP 97/56 | HR 86 | Temp 98.6°F | Resp 18

## 2017-06-16 DIAGNOSIS — Z9884 Bariatric surgery status: Secondary | ICD-10-CM | POA: Diagnosis not present

## 2017-06-16 DIAGNOSIS — D508 Other iron deficiency anemias: Secondary | ICD-10-CM

## 2017-06-16 MED ORDER — FERUMOXYTOL INJECTION 510 MG/17 ML
510.0000 mg | Freq: Once | INTRAVENOUS | Status: AC
  Administered 2017-06-16: 510 mg via INTRAVENOUS
  Filled 2017-06-16: qty 17

## 2017-06-16 MED ORDER — SODIUM CHLORIDE 0.9 % IV SOLN
Freq: Once | INTRAVENOUS | Status: AC
  Administered 2017-06-16: 14:00:00 via INTRAVENOUS

## 2017-06-16 MED ORDER — SODIUM CHLORIDE 0.9% FLUSH
3.0000 mL | Freq: Once | INTRAVENOUS | Status: AC | PRN
  Administered 2017-06-16: 3 mL via INTRAVENOUS

## 2017-06-16 NOTE — Patient Instructions (Signed)
[facility] at [facility] Discharge Instructions  RECOMMENDATIONS MADE BY THE CONSULTANT AND ANY TEST RESULTS WILL BE SENT TO YOUR REFERRING PHYSICIAN.  Received Feraheme infusion today.Follow-up as scheduled. Call clinic for any questions or concerns  Thank you for choosing  Cancer Center at [facility] to provide your oncology and hematology care.  To afford each patient quality time with our provider, please arrive at least 15 minutes before your scheduled appointment time.    If you have a lab appointment with the Cancer Center please come in thru the  Main Entrance and check in at the main information desk  You need to re-schedule your appointment should you arrive 10 or more minutes late.  We strive to give you quality time with our providers, and arriving late affects you and other patients whose appointments are after yours.  Also, if you no show three or more times for appointments you may be dismissed from the clinic at the providers discretion.     Again, thank you for choosing [facility].  Our hope is that these requests will decrease the amount of time that you wait before being seen by our physicians.       _____________________________________________________________  Should you have questions after your visit to [facility], please contact our office at [phone] between the hours of 8:30 a.m. and 4:30 p.m.  Voicemails left after 4:30 p.m. will not be returned until the following business day.  For prescription refill requests, have your pharmacy contact our office.       Resources For Cancer Patients and their Caregivers ? American Cancer Society: Can assist with transportation, wigs, general needs, runs Look Good Feel Better.        1-888-227-6333 ? Cancer Care: Provides financial assistance, online support groups, medication/co-pay assistance.  1-800-813-HOPE (4673) ? [facility] Assists [place] cancer patients and their families through emotional , educational and financial support.  336-427-4357 ? [place] DSS Where to apply for food stamps, Medicaid and utility assistance. 336-342-1394 ? RCATS: Transportation to medical appointments. 336-347-2287 ? Social Security Administration: May apply for disability if have a Stage IV cancer. 336-342-7796 1-800-772-1213 ? [place] Aging, Disability and Transit Services: Assists with nutrition, care and transit needs. 336-349-2343  Cancer Center Support Programs: @10RELATIVEDAYS@ > Cancer Support Group  2nd Tuesday of the month 1pm-2pm, Journey Room  > Creative Journey  3rd Tuesday of the month 1130am-1pm, Journey Room  > Look Good Feel Better  1st Wednesday of the month 10am-12 noon, Journey Room (Call American Cancer Society to register 1-800-395-5775)   

## 2017-06-16 NOTE — Progress Notes (Signed)
Patient received iron infusion without any complaints.  Tolerated well.  Good blood return noted before and after infusion.  Discharged with VSS stable and with instructions to call for any problems.  Verbalized understanding.  Patient left ambulatory with no complaints voiced.

## 2017-06-22 DIAGNOSIS — O900 Disruption of cesarean delivery wound: Secondary | ICD-10-CM | POA: Diagnosis not present

## 2017-07-06 DIAGNOSIS — Z1389 Encounter for screening for other disorder: Secondary | ICD-10-CM | POA: Diagnosis not present

## 2017-07-19 ENCOUNTER — Other Ambulatory Visit: Payer: Self-pay | Admitting: Family Medicine

## 2017-07-20 ENCOUNTER — Encounter (HOSPITAL_COMMUNITY): Payer: BLUE CROSS/BLUE SHIELD | Attending: Oncology

## 2017-07-20 DIAGNOSIS — E538 Deficiency of other specified B group vitamins: Secondary | ICD-10-CM | POA: Diagnosis not present

## 2017-07-20 DIAGNOSIS — D508 Other iron deficiency anemias: Secondary | ICD-10-CM | POA: Diagnosis not present

## 2017-07-20 LAB — CBC WITH DIFFERENTIAL/PLATELET
Basophils Absolute: 0.1 10*3/uL (ref 0.0–0.1)
Basophils Relative: 1 %
Eosinophils Absolute: 0.5 10*3/uL (ref 0.0–0.7)
Eosinophils Relative: 6 %
HCT: 39.2 % (ref 36.0–46.0)
Hemoglobin: 13 g/dL (ref 12.0–15.0)
Lymphocytes Relative: 26 %
Lymphs Abs: 2.1 10*3/uL (ref 0.7–4.0)
MCH: 30.2 pg (ref 26.0–34.0)
MCHC: 33.2 g/dL (ref 30.0–36.0)
MCV: 91.2 fL (ref 78.0–100.0)
Monocytes Absolute: 0.3 10*3/uL (ref 0.1–1.0)
Monocytes Relative: 4 %
Neutro Abs: 5.3 10*3/uL (ref 1.7–7.7)
Neutrophils Relative %: 63 %
Platelets: 287 10*3/uL (ref 150–400)
RBC: 4.3 MIL/uL (ref 3.87–5.11)
RDW: 14.4 % (ref 11.5–15.5)
WBC: 8.3 10*3/uL (ref 4.0–10.5)

## 2017-07-20 LAB — IRON AND TIBC
Iron: 54 ug/dL (ref 28–170)
Saturation Ratios: 19 % (ref 10.4–31.8)
TIBC: 286 ug/dL (ref 250–450)
UIBC: 232 ug/dL

## 2017-07-20 LAB — FERRITIN: Ferritin: 152 ng/mL (ref 11–307)

## 2017-07-20 LAB — VITAMIN B12: Vitamin B-12: 525 pg/mL (ref 180–914)

## 2017-07-26 DIAGNOSIS — G43719 Chronic migraine without aura, intractable, without status migrainosus: Secondary | ICD-10-CM | POA: Diagnosis not present

## 2017-07-30 DIAGNOSIS — G43719 Chronic migraine without aura, intractable, without status migrainosus: Secondary | ICD-10-CM | POA: Diagnosis not present

## 2017-09-01 ENCOUNTER — Encounter (HOSPITAL_COMMUNITY): Payer: Self-pay | Admitting: Adult Health

## 2017-09-01 ENCOUNTER — Encounter (HOSPITAL_COMMUNITY): Payer: BLUE CROSS/BLUE SHIELD | Attending: Oncology | Admitting: Adult Health

## 2017-09-01 ENCOUNTER — Encounter (HOSPITAL_COMMUNITY): Payer: BLUE CROSS/BLUE SHIELD

## 2017-09-01 VITALS — BP 116/52 | HR 75 | Temp 98.5°F | Resp 16 | Wt 216.9 lb

## 2017-09-01 DIAGNOSIS — Z9884 Bariatric surgery status: Secondary | ICD-10-CM | POA: Diagnosis not present

## 2017-09-01 DIAGNOSIS — N921 Excessive and frequent menstruation with irregular cycle: Secondary | ICD-10-CM

## 2017-09-01 DIAGNOSIS — E538 Deficiency of other specified B group vitamins: Secondary | ICD-10-CM

## 2017-09-01 DIAGNOSIS — D508 Other iron deficiency anemias: Secondary | ICD-10-CM | POA: Insufficient documentation

## 2017-09-01 DIAGNOSIS — Z23 Encounter for immunization: Secondary | ICD-10-CM | POA: Diagnosis not present

## 2017-09-01 LAB — CBC WITH DIFFERENTIAL/PLATELET
Basophils Absolute: 0 10*3/uL (ref 0.0–0.1)
Basophils Relative: 0 %
Eosinophils Absolute: 0.3 10*3/uL (ref 0.0–0.7)
Eosinophils Relative: 4 %
HCT: 40.3 % (ref 36.0–46.0)
Hemoglobin: 13.5 g/dL (ref 12.0–15.0)
Lymphocytes Relative: 22 %
Lymphs Abs: 2 10*3/uL (ref 0.7–4.0)
MCH: 30 pg (ref 26.0–34.0)
MCHC: 33.5 g/dL (ref 30.0–36.0)
MCV: 89.6 fL (ref 78.0–100.0)
Monocytes Absolute: 0.5 10*3/uL (ref 0.1–1.0)
Monocytes Relative: 6 %
Neutro Abs: 6.2 10*3/uL (ref 1.7–7.7)
Neutrophils Relative %: 68 %
Platelets: 255 10*3/uL (ref 150–400)
RBC: 4.5 MIL/uL (ref 3.87–5.11)
RDW: 13.7 % (ref 11.5–15.5)
WBC: 9.1 10*3/uL (ref 4.0–10.5)

## 2017-09-01 LAB — BASIC METABOLIC PANEL
Anion gap: 8 (ref 5–15)
BUN: 20 mg/dL (ref 6–20)
CO2: 24 mmol/L (ref 22–32)
Calcium: 9 mg/dL (ref 8.9–10.3)
Chloride: 107 mmol/L (ref 101–111)
Creatinine, Ser: 0.64 mg/dL (ref 0.44–1.00)
GFR calc Af Amer: >60 mL/min (ref >60)
GFR calc non Af Amer: >60 mL/min (ref >60)
Glucose, Bld: 98 mg/dL (ref 65–99)
Potassium: 3.9 mmol/L (ref 3.5–5.1)
Sodium: 139 mmol/L (ref 135–145)

## 2017-09-01 LAB — FERRITIN: Ferritin: 95 ng/mL (ref 11–307)

## 2017-09-01 LAB — VITAMIN B12: Vitamin B-12: 178 pg/mL — ABNORMAL LOW (ref 180–914)

## 2017-09-01 MED ORDER — INFLUENZA VAC SPLIT QUAD 0.5 ML IM SUSY
0.5000 mL | PREFILLED_SYRINGE | Freq: Once | INTRAMUSCULAR | Status: AC
  Administered 2017-09-01: 0.5 mL via INTRAMUSCULAR
  Filled 2017-09-01: qty 0.5

## 2017-09-01 NOTE — Patient Instructions (Addendum)
[place] at [facility] Discharge Instructions  RECOMMENDATIONS MADE BY THE CONSULTANT AND ANY TEST RESULTS WILL BE SENT TO YOUR REFERRING PHYSICIAN.  You were seen today by Mike Craze, NP Lab work in 2 months Follow up in 4 months with labs See schedulers up front for appointments   Thank you for choosing [place] at [facility] to provide your oncology and hematology care.  To afford each patient quality time with our provider, please arrive at least 15 minutes before your scheduled appointment time.    If you have a lab appointment with the [facility] please come in thru the  Main Entrance and check in at the main information desk  You need to re-schedule your appointment should you arrive 10 or more minutes late.  We strive to give you quality time with our providers, and arriving late affects you and other patients whose appointments are after yours.  Also, if you no show three or more times for appointments you may be dismissed from the clinic at the providers discretion.     Again, thank you for choosing [facility].  Our hope is that these requests will decrease the amount of time that you wait before being seen by our physicians.       _____________________________________________________________  Should you have questions after your visit to [facility], please contact our office at [phone] between the hours of 8:30 a.m. and 4:30 p.m.  Voicemails left after 4:30 p.m. will not be returned until the following business day.  For prescription refill requests, have your pharmacy contact our office.       Resources For Cancer Patients and their Caregivers ? American Cancer Society: Can assist with transportation, wigs, general needs, runs Look Good Feel Better.        816-478-5158 ? Cancer Care: Provides financial assistance, online support groups, medication/co-pay assistance.   1-800-813-HOPE (985) 730-7958) ? [facility] Assists [place] cancer patients and their families through emotional , educational and financial support.  (334)570-0069 ? [place] DSS Where to apply for food stamps, Medicaid and utility assistance. 207-630-9740 ? RCATS: Transportation to medical appointments. (708) 833-2395 ? Social Security Administration: May apply for disability if have a Stage IV cancer. (802) 420-6366 562-643-8284 ? [employer], Disability and Transit Services: Assists with nutrition, care and transit needs. [place] Support Programs: @10RELATIVEDAYS @ > Cancer Support Group  2nd Tuesday of the month 1pm-2pm, Journey Room  > Creative Journey  3rd Tuesday of the month 1130am-1pm, Journey Room  > Look Good Feel Better  1st Wednesday of the month 10am-12 noon, Journey Room (Call [facility] to register [phone])

## 2017-09-01 NOTE — Progress Notes (Signed)
Alejandra Barr presents today for injection per MD orders. Fluarix administered IM in right deltoid. Administration without incident. Patient tolerated well. Patient tolerated treatment without incidence. Patient discharged ambulatory and in stable condition from clinic. Patient to follow up as scheduled.

## 2017-09-01 NOTE — Progress Notes (Signed)
Alejandra Barr, [place] [address]   CLINIC:  Medical Oncology/Hematology  PCP:  Fayrene Helper, MD [address] [place] [address] [phone]   REASON FOR VISIT:  Follow-up for iron deficiency anemia and h/o vitamin B12 deficiency  CURRENT THERAPY: IV iron prn     HISTORY OF PRESENT ILLNESS:  (From Kirby Crigler, PA-C's last note on 05/18/17)       INTERVAL HISTORY:  Alejandra Barr 31 y.o. female returns for routine follow-up for iron deficiency anemia.    Overall, she tells me she has been feeling well.  Appetite 100%; energy levels 75%. Denies any pain.    She had her 1st child, Hulen Skains, at the end of 05/2017 via C-section.  She shares her labor experience with me; states that she was in labor for ~18 hours and that baby was lodged in her pelvis requiring C-section. She was told that they estimate she lost ~800 mL blood loss from her procedure and recovery.  She received 2 doses of IV Feraheme in early 06/2017; reports that her energy levels have improved quite a bit since that time.  She still has some fatigue, "but it is so much better than it was!"    Her menstrual cycle returned ~7 weeks post-partum. Periods have been heavy and irregular.  Her OB-GYN is Dr. Lynnette Caffey at [facility] in [place].  Her fertility specialists are with [facility] in [place].  She and her husband are planning to wait 12-18 months and then are considering additional embryo transfer at that time.  She shows me pictures of her son today; he is adorable.    Otherwise, she is largely without other complaints today.      REVIEW OF SYSTEMS:  Review of Systems  Constitutional: Positive for fatigue. Negative for chills and fever.  HENT:  Negative.  Negative for lump/mass and nosebleeds.   Eyes: Negative.   Respiratory: Negative.  Negative for cough and shortness of breath.   Cardiovascular: Negative.  Negative for chest pain and leg  swelling.  Gastrointestinal: Negative.  Negative for abdominal pain, blood in stool, constipation, diarrhea, nausea and vomiting.  Endocrine: Negative.   Genitourinary: Positive for menstrual problem and vaginal bleeding. Negative for dysuria and hematuria.   Musculoskeletal: Negative.  Negative for arthralgias.  Skin: Negative.  Negative for rash.  Neurological: Negative.  Negative for dizziness and headaches.  Hematological: Negative.  Negative for adenopathy. Does not bruise/bleed easily.  Psychiatric/Behavioral: Negative.  Negative for depression and sleep disturbance. The patient is not nervous/anxious.      PAST MEDICAL/SURGICAL HISTORY:  Past Medical History:  Diagnosis Date  . Asthma   . Atrial flutter ([place])    Status post RFA by Dr. Lovena Le in 2004  . Chronic anxiety   . Chronic depression   . 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 Iron deficiency   . Iron deficiency anemia 10/02/2010   Qualifier: Diagnosis of  By: Moshe Cipro MD, Joycelyn Schmid    . Low vitamin B12 level 07/10/2013   Overview:  Last Assessment & Plan:  Being treated through hematology  . Newborn product of in vitro fertilization (IVF) pregnancy   . Obesity    Past Surgical History:  Procedure Laterality Date  . Bilateral eustachian tube placement on 4 different occasions    . CARDIAC ELECTROPHYSIOLOGY MAPPING AND ABLATION    . CESAREAN SECTION N/A 05/26/2017   Procedure: CESAREAN SECTION;  Surgeon: Linda Hedges, DO;  Location:  [facility];  Service: Obstetrics;  Laterality: N/A;  . CHOLECYSTECTOMY  2003  . COLONOSCOPY N/A 11/29/2013   Procedure: COLONOSCOPY;  Surgeon: Rogene Houston, MD;  Location: [facility];  Service: Endoscopy;  Laterality: N/A;  120-rescheduled to [address] notified pt  . GASTRIC BYPASS  2006  . TONSILLECTOMY  1990     SOCIAL HISTORY:  Social History   Social History  . Marital status: Married    Spouse name: N/A  . Number of children: N/A  . Years of education: N/A    Occupational History  . Nurse    .  [employer]   Social History Main Topics  . Smoking status: Former Smoker    Packs/day: 0.50    Types: Cigarettes    Start date: 03/21/2013    Quit date: 06/02/2015  . Smokeless tobacco: Never Used  . Alcohol use No  . Drug use: No  . Sexual activity: Yes    Birth control/ protection: None     Comment: husband had vasectomy   Other Topics Concern  . Not on file   Social History Narrative  . No narrative on file    FAMILY HISTORY:  Family History  Problem Relation Age of Onset  . Fibromyalgia Mother        Chronic pelvic pain   . Breast cancer Mother   . Hypothyroidism Mother   . Obesity Mother   . Heart defect Mother        Supraventricular, tachycardia /tachypalpation   . Arrhythmia Mother   . Hypertension Maternal Grandmother   . Diabetes Maternal Grandmother   . Hypertension Maternal Grandfather   . Heart disease Paternal Grandmother   . Hypertension Paternal Grandmother   . Heart disease Paternal Grandfather   . Hypertension Paternal Grandfather     CURRENT MEDICATIONS:  Outpatient Encounter Prescriptions as of 09/01/2017  Medication Sig  . ferrous sulfate 300 (60 Fe) MG/5ML syrup Take 5 mLs (300 mg total) by mouth 2 (two) times daily with a meal. Mix in 4oz of orange juice  . metoprolol succinate (TOPROL-XL) 25 MG 24 hr tablet TAKE 1 TABLET BY MOUTH DAILY  . Prenatal Vit-Fe Fumarate-FA (PRENATAL MULTIVITAMIN) TABS tablet Take 1 tablet by mouth 2 (two) times daily.  . [DISCONTINUED] ibuprofen (ADVIL,MOTRIN) 600 MG tablet Take 1 tablet (600 mg total) by mouth every 6 (six) hours.  . [EXPIRED] Influenza vac split quadrivalent PF (FLUARIX) injection 0.5 mL    No facility-administered encounter medications on file as of 09/01/2017.     ALLERGIES:  Allergies  Allergen Reactions  . Ciprofloxacin Anaphylaxis  . Morphine And Related Anaphylaxis  . Penicillins Anaphylaxis    Has patient had a PCN reaction causing immediate rash,  facial/tongue/throat swelling, SOB or lightheadedness with hypotension: No Has patient had a PCN reaction causing severe rash involving mucus membranes or skin necrosis: No Has patient had a PCN reaction that required hospitalization: No Has patient had a PCN reaction occurring within the last 10 years: No If all of the above answers are "NO", then may proceed with Cephalosporin use.     PHYSICAL EXAM:  ECOG Performance status: 1 - Mildly symptomatic; remains independent  Vitals:   09/01/17 1257  BP: (!) 116/52  Pulse: 75  Resp: 16  Temp: 98.5 F (36.9 C)  SpO2: 99%   Filed Weights   09/01/17 1257  Weight: 216 lb 14.4 oz (98.4 kg)    Physical Exam  Constitutional: She is oriented to person, place,  and time and well-developed, well-nourished, and in no distress.  HENT:  Head: Normocephalic.  Mouth/Throat: Oropharynx is clear and moist. No oropharyngeal exudate.  Eyes: Pupils are equal, round, and reactive to light. Conjunctivae are normal. No scleral icterus.  Neck: Normal range of motion. Neck supple.  Cardiovascular: Normal rate, regular rhythm and normal heart sounds.   Pulmonary/Chest: Effort normal and breath sounds normal. No respiratory distress.  Abdominal: Soft. Bowel sounds are normal. There is no tenderness.  Musculoskeletal: Normal range of motion. She exhibits no edema.  Lymphadenopathy:    She has no cervical adenopathy.       Right: No supraclavicular adenopathy present.       Left: No supraclavicular adenopathy present.  Neurological: She is alert and oriented to person, place, and time. No cranial nerve deficit. Gait normal.  Skin: Skin is warm and dry. No rash noted.  Psychiatric: Mood, memory, affect and judgment normal.  Nursing note and vitals reviewed.    LABORATORY DATA:  I have reviewed the labs as listed.  CBC    Component Value Date/Time   WBC 9.1 09/01/2017 1224   RBC 4.50 09/01/2017 1224   HGB 13.5 09/01/2017 1224   HCT 40.3 09/01/2017  1224   PLT 255 09/01/2017 1224   MCV 89.6 09/01/2017 1224   MCH 30.0 09/01/2017 1224   MCHC 33.5 09/01/2017 1224   RDW 13.7 09/01/2017 1224   LYMPHSABS 2.0 09/01/2017 1224   MONOABS 0.5 09/01/2017 1224   EOSABS 0.3 09/01/2017 1224   BASOSABS 0.0 09/01/2017 1224   CMP Latest Ref Rng & Units 09/01/2017 05/18/2017 03/30/2017  Glucose 65 - 99 mg/dL 98 86 88  BUN 6 - 20 mg/dL 20 11 17   Creatinine 0.44 - 1.00 mg/dL 0.64 0.62 0.56  Sodium 135 - 145 mmol/L 139 134(L) 137  Potassium 3.5 - 5.1 mmol/L 3.9 3.8 4.1  Chloride 101 - 111 mmol/L 107 104 109  CO2 22 - 32 mmol/L 24 21(L) 22  Calcium 8.9 - 10.3 mg/dL 9.0 8.9 8.7(L)  Total Protein 6.5 - 8.1 g/dL - 6.3(L) 6.2(L)  Total Bilirubin 0.3 - 1.2 mg/dL - 0.4 0.3  Alkaline Phos 38 - 126 U/L - 135(H) 83  AST 15 - 41 U/L - 18 16  ALT 14 - 54 U/L - 11(L) 9(L)    PENDING LABS:    DIAGNOSTIC IMAGING:    PATHOLOGY:     ASSESSMENT & PLAN:   Iron deficiency anemia:  -Likely secondary to decreased absorption s/p gastric bypass surgery and irregular/heavy menses.  -Goal is to maintain ferritin >100. Historically, her hemoglobin has remained generally normal despite her being iron deficient.  Her last IV iron infusions were shortly after her recent C-section delivery of her first child; they told her they estimate ~800 mL blood loss at that time in 05/2017. In early 06/2017, she received 2 doses of IV Feraheme and her Hgb at that time was actually low in the mid-9 range.  She notes great symptom improvement after receiving 2 doses of IV Feraheme in 06/2017.  -Hgb today normal at 13.5 g/dL. Her menses have returned since her recent delivery; they have been irregular and heavy per her report.   Iron studies are pending for today. We will contact her with results and make arrangements for additional IV iron if needed.  She agrees with this plan.  -Given that she has required 2 doses of IV iron in recent months and is now 3 months post-partum from  delivering her  first child, will make arrangements to bring her back for labs only in 2 months to ensure stability of her iron studies.   -Then, return to cancer center in 4 months for follow-up with labs.  If her iron studies remain stable at that time, then we can consider transitioning her to follow-up visits every 6 months.    Health maintenance:  -Annual flu vaccine given today by nursing per patient request.      Dispo:  -Labs only in 2 months.  -Return to cancer center in 4 months with labs.    All questions were answered to patient's stated satisfaction. Encouraged patient to call with any new concerns or questions before her next visit to the cancer center and we can certain see her sooner, if needed.    Plan of care discussed with Dr. Talbert Cage, who agrees with the above aforementioned.    Orders placed this encounter:  Orders Placed This Encounter  Procedures  . CBC with Differential/Platelet  . Vitamin B12  . Ferritin  . Iron and TIBC  . Basic metabolic panel      Mike Craze, NP [place] [phone]

## 2017-09-15 ENCOUNTER — Encounter (HOSPITAL_COMMUNITY): Payer: Self-pay | Admitting: Emergency Medicine

## 2017-10-04 ENCOUNTER — Other Ambulatory Visit: Payer: Self-pay | Admitting: Obstetrics & Gynecology

## 2017-10-04 DIAGNOSIS — Z139 Encounter for screening, unspecified: Secondary | ICD-10-CM

## 2017-10-11 ENCOUNTER — Other Ambulatory Visit: Payer: Self-pay | Admitting: Family Medicine

## 2017-10-19 DIAGNOSIS — G43719 Chronic migraine without aura, intractable, without status migrainosus: Secondary | ICD-10-CM | POA: Diagnosis not present

## 2017-10-20 ENCOUNTER — Ambulatory Visit
Admission: RE | Admit: 2017-10-20 | Discharge: 2017-10-20 | Disposition: A | Discharge status: Home / Self Care | Payer: BLUE CROSS/BLUE SHIELD | Source: Ambulatory Visit | Attending: Obstetrics & Gynecology | Admitting: Obstetrics & Gynecology

## 2017-10-20 DIAGNOSIS — Z139 Encounter for screening, unspecified: Secondary | ICD-10-CM

## 2017-10-20 DIAGNOSIS — Z1231 Encounter for screening mammogram for malignant neoplasm of breast: Secondary | ICD-10-CM | POA: Diagnosis not present

## 2017-10-21 DIAGNOSIS — G43711 Chronic migraine without aura, intractable, with status migrainosus: Secondary | ICD-10-CM | POA: Diagnosis not present

## 2017-10-27 DIAGNOSIS — Z6841 Body Mass Index (BMI) 40.0 and over, adult: Secondary | ICD-10-CM | POA: Diagnosis not present

## 2017-10-27 DIAGNOSIS — J329 Chronic sinusitis, unspecified: Secondary | ICD-10-CM | POA: Diagnosis not present

## 2017-10-29 ENCOUNTER — Encounter (HOSPITAL_COMMUNITY): Payer: BLUE CROSS/BLUE SHIELD | Attending: Oncology

## 2017-10-29 DIAGNOSIS — D508 Other iron deficiency anemias: Secondary | ICD-10-CM

## 2017-10-29 DIAGNOSIS — E538 Deficiency of other specified B group vitamins: Secondary | ICD-10-CM | POA: Insufficient documentation

## 2017-10-29 LAB — IRON AND TIBC
Iron: 98 ug/dL (ref 28–170)
Saturation Ratios: 29 % (ref 10.4–31.8)
TIBC: 342 ug/dL (ref 250–450)
UIBC: 244 ug/dL

## 2017-10-29 LAB — CBC WITH DIFFERENTIAL/PLATELET
Basophils Absolute: 0.1 10*3/uL (ref 0.0–0.1)
Basophils Relative: 1 %
Eosinophils Absolute: 0.3 10*3/uL (ref 0.0–0.7)
Eosinophils Relative: 4 %
HCT: 40.6 % (ref 36.0–46.0)
Hemoglobin: 12.8 g/dL (ref 12.0–15.0)
Lymphocytes Relative: 20 %
Lymphs Abs: 1.7 10*3/uL (ref 0.7–4.0)
MCH: 28.7 pg (ref 26.0–34.0)
MCHC: 31.5 g/dL (ref 30.0–36.0)
MCV: 91 fL (ref 78.0–100.0)
Monocytes Absolute: 0.6 10*3/uL (ref 0.1–1.0)
Monocytes Relative: 7 %
Neutro Abs: 6 10*3/uL (ref 1.7–7.7)
Neutrophils Relative %: 68 %
Platelets: 334 10*3/uL (ref 150–400)
RBC: 4.46 MIL/uL (ref 3.87–5.11)
RDW: 13.2 % (ref 11.5–15.5)
WBC: 8.7 10*3/uL (ref 4.0–10.5)

## 2017-10-29 LAB — BASIC METABOLIC PANEL
Anion gap: 10 (ref 5–15)
BUN: 22 mg/dL — ABNORMAL HIGH (ref 6–20)
CO2: 25 mmol/L (ref 22–32)
Calcium: 9 mg/dL (ref 8.9–10.3)
Chloride: 102 mmol/L (ref 101–111)
Creatinine, Ser: 0.93 mg/dL (ref 0.44–1.00)
GFR calc Af Amer: >60 mL/min (ref >60)
GFR calc non Af Amer: >60 mL/min (ref >60)
Glucose, Bld: 101 mg/dL — ABNORMAL HIGH (ref 65–99)
Potassium: 4.4 mmol/L (ref 3.5–5.1)
Sodium: 137 mmol/L (ref 135–145)

## 2017-10-29 LAB — FERRITIN: Ferritin: 246 ng/mL (ref 11–307)

## 2017-10-29 LAB — VITAMIN B12: Vitamin B-12: 1261 pg/mL — ABNORMAL HIGH (ref 180–914)

## 2017-11-03 ENCOUNTER — Ambulatory Visit (INDEPENDENT_AMBULATORY_CARE_PROVIDER_SITE_OTHER): Payer: BLUE CROSS/BLUE SHIELD | Admitting: Family Medicine

## 2017-11-03 ENCOUNTER — Ambulatory Visit (HOSPITAL_COMMUNITY)
Admission: RE | Admit: 2017-11-03 | Discharge: 2017-11-03 | Disposition: A | Discharge status: Home / Self Care | Payer: BLUE CROSS/BLUE SHIELD | Source: Ambulatory Visit | Attending: Family Medicine | Admitting: Family Medicine

## 2017-11-03 ENCOUNTER — Encounter: Payer: Self-pay | Admitting: Family Medicine

## 2017-11-03 ENCOUNTER — Telehealth: Payer: Self-pay | Admitting: Family Medicine

## 2017-11-03 VITALS — BP 118/80 | HR 94 | Resp 16 | Ht 62.0 in | Wt 212.0 lb

## 2017-11-03 DIAGNOSIS — R0789 Other chest pain: Secondary | ICD-10-CM | POA: Insufficient documentation

## 2017-11-03 DIAGNOSIS — R05 Cough: Secondary | ICD-10-CM | POA: Diagnosis not present

## 2017-11-03 DIAGNOSIS — E559 Vitamin D deficiency, unspecified: Secondary | ICD-10-CM

## 2017-11-03 DIAGNOSIS — J320 Chronic maxillary sinusitis: Secondary | ICD-10-CM | POA: Diagnosis not present

## 2017-11-03 DIAGNOSIS — E611 Iron deficiency: Secondary | ICD-10-CM

## 2017-11-03 DIAGNOSIS — G43109 Migraine with aura, not intractable, without status migrainosus: Secondary | ICD-10-CM | POA: Diagnosis not present

## 2017-11-03 DIAGNOSIS — J984 Other disorders of lung: Secondary | ICD-10-CM | POA: Insufficient documentation

## 2017-11-03 DIAGNOSIS — J4520 Mild intermittent asthma, uncomplicated: Secondary | ICD-10-CM | POA: Diagnosis not present

## 2017-11-03 DIAGNOSIS — J40 Bronchitis, not specified as acute or chronic: Secondary | ICD-10-CM | POA: Insufficient documentation

## 2017-11-03 MED ORDER — PREDNISONE 5 MG PO TABS: 5.0000 mg | ORAL_TABLET | Freq: Two times a day (BID) | ORAL | 0 refills | Status: AC

## 2017-11-03 MED ORDER — BUTALBITAL-APAP-CAFFEINE 50-325-40 MG PO TABS: ORAL_TABLET | ORAL | 1 refills | Status: DC

## 2017-11-03 MED ORDER — TOPIRAMATE 50 MG PO TABS: 50.0000 mg | ORAL_TABLET | Freq: Two times a day (BID) | ORAL | 1 refills | Status: DC

## 2017-11-03 NOTE — Telephone Encounter (Signed)
I woyuld prefer to see her since I have not seen her for over 1 year, depending on NS rate , we already have, 1 I think we can work her in today, I meant to send for a f/u from the UC for her as well, work her in before 2: 30 pm please

## 2017-11-03 NOTE — Telephone Encounter (Signed)
Ok to see Meda Coffee or you want to work in? (already at 21)

## 2017-11-03 NOTE — Patient Instructions (Addendum)
F/u in 4 months, call if you need me before  You have been started on topamax and fioricet for migraines  5 days of prednisone sent for residual cough and sinus pressure, also do twice daily nasal flushes with saline, continue prescribed nose spray  CXR today, result note will be sent to you   You are referred to hand surgeon re ganglion cyst on left  Wrist present since past 2 weeks  Fasting lipid, TSH and vit D 1 week before next visit

## 2017-11-03 NOTE — Telephone Encounter (Signed)
MOM Manuela Schwartz) is calling in for Alejandra Barr, states she is really sick with a upper resp infection went to the Urgent care they gave her meds, but no better, feels that it has moved to her chest, chest and back are hurting, bad cough. Wants to be seen ASAP, if Dr Moshe Cipro cant see her, she is ok to see Dr Gloriann Loan [phone]

## 2017-11-04 ENCOUNTER — Encounter: Payer: Self-pay | Admitting: Family Medicine

## 2017-11-07 ENCOUNTER — Other Ambulatory Visit: Payer: Self-pay | Admitting: Family Medicine

## 2017-11-07 DIAGNOSIS — M67431 Ganglion, right wrist: Secondary | ICD-10-CM

## 2017-11-07 DIAGNOSIS — J329 Chronic sinusitis, unspecified: Secondary | ICD-10-CM | POA: Insufficient documentation

## 2017-11-07 NOTE — Assessment & Plan Note (Signed)
Updated lab needed at/ before next visit.   

## 2017-11-07 NOTE — Assessment & Plan Note (Signed)
Improving, pt applauded on this and encouraged to continue Patient re-educated about  the importance of commitment to a  minimum of 150 minutes of exercise per week.  The importance of healthy food choices with portion control discussed. Encouraged to start a food diary, count calories and to consider  joining a support group. Sample diet sheets offered. Goals set by the patient for the next several months.   Weight /BMI 11/03/2017 09/01/2017 05/25/2017  WEIGHT 212 lb 216 lb 14.4 oz 244 lb  HEIGHT 5\' 2"  - 5\' 2"   BMI 38.78 kg/m2 39.67 kg/m2 44.63 kg/m2

## 2017-11-07 NOTE — Assessment & Plan Note (Signed)
Saline flushes twice daily for 5 days , then as needed

## 2017-11-07 NOTE — Assessment & Plan Note (Signed)
Adequately treated with oral iron currently

## 2017-11-07 NOTE — Progress Notes (Signed)
   Alejandra Barr     MRN: [id number]      DOB: [birth year]   HPI Alejandra Barr is here for follow up from [facility] visit on 10/27/2017 when she presented with URI symptoms, was prescribed a Z pack, she called in because of lingering facial pressure, one episode of thick green post nasal expectorate and cough with chest congestion with no sputum production but right chest discomfort  Increased  headache frequency post partum on average once weekly , wants to resume topamax and fioricet, she is not breast feeding, she denies tobacco and alcohol use Since delivering her first child 5 months ago, she has been diligent with diet and is resuming exercise and been successful in weight lsos, she is happy and has good support from her family , plans to continue weight loss Tender swelling on left wrist which is mobile noted in past 2 week, no direct trauma to area, lifts infant often ROS  Denies chest pains, palpitations and leg swelling Denies abdominal pain, nausea, vomiting,diarrhea or constipation.   Denies dysuria, frequency, hesitancy or incontinence. . Denies  seizures, numbness, or tingling. Denies depression, anxiety or insomnia. Denies skin break down or rash.   PE  BP 118/80   Pulse 94   Resp 16   Ht 5\' 2"  (1.575 m)   Wt 212 lb (96.2 kg)   LMP 10/13/2017   SpO2 99%   BMI 38.78 kg/m   Patient alert and oriented and in no cardiopulmonary distress.  HEENT: No facial asymmetry, EOMI,   oropharynx pink and moist.  Neck supple no JVD, no mass.mild maxillary  sinus tenderness, TM clear, no cervical adenopathy, no erythema or edema of nasal mucosa  Chest: decreased air entry in right lung, no crackles or wheezes  CVS: S1, S2 no murmurs, no S3.Regular rate.  ABD: Soft non tender.   Ext: No edema  MS: Adequate ROM spine, shoulders, hips and knees.ganglion cyst left wrist   Skin: Intact, no ulcerations or rash noted.  Psych: Good eye contact, normal affect. Memory intact not anxious or  depressed appearing.  CNS: CN 2-12 intact, power,  normal throughout.no focal deficits noted.   Assessment & Plan  Bronchitis Residual cough and sinus pressure following treated respiratory infection in pt with h/o asthma and infant at home, reduced air entry on right lung CXR, short course of prednisone and saline nasal flushes twice daily for sinus pressure  Sinusitis nasal Saline flushes twice daily for 5 days , then as needed  Iron deficiency Adequately treated with oral iron currently  Morbid obesity ([place]) Improving, pt applauded on this and encouraged to continue Patient re-educated about  the importance of commitment to a  minimum of 150 minutes of exercise per week.  The importance of healthy food choices with portion control discussed. Encouraged to start a food diary, count calories and to consider  joining a support group. Sample diet sheets offered. Goals set by the patient for the next several months.   Weight /BMI 11/03/2017 09/01/2017 05/25/2017  WEIGHT 212 lb 216 lb 14.4 oz 244 lb  HEIGHT 5\' 2"  - 5\' 2"   BMI 38.78 kg/m2 39.67 kg/m2 44.63 kg/m2      Migraine headache with aura Uncontrolled, resume twice daily tpamax, lowest effective dose and fioricet for headache  Vitamin D deficiency Updated lab needed at/ before next visit.

## 2017-11-07 NOTE — Assessment & Plan Note (Signed)
Uncontrolled, resume twice daily tpamax, lowest effective dose and fioricet for headache

## 2017-11-07 NOTE — Assessment & Plan Note (Signed)
Residual cough and sinus pressure following treated respiratory infection in pt with h/o asthma and infant at home, reduced air entry on right lung CXR, short course of prednisone and saline nasal flushes twice daily for sinus pressure

## 2017-11-18 ENCOUNTER — Ambulatory Visit: Payer: BLUE CROSS/BLUE SHIELD | Admitting: Family Medicine

## 2017-12-09 DIAGNOSIS — M654 Radial styloid tenosynovitis [de Quervain]: Secondary | ICD-10-CM | POA: Diagnosis not present

## 2017-12-09 DIAGNOSIS — G5603 Carpal tunnel syndrome, bilateral upper limbs: Secondary | ICD-10-CM | POA: Diagnosis not present

## 2017-12-09 DIAGNOSIS — M25532 Pain in left wrist: Secondary | ICD-10-CM | POA: Diagnosis not present

## 2017-12-30 ENCOUNTER — Ambulatory Visit (HOSPITAL_COMMUNITY): Payer: BLUE CROSS/BLUE SHIELD | Admitting: Adult Health

## 2017-12-30 ENCOUNTER — Ambulatory Visit (HOSPITAL_COMMUNITY): Payer: BLUE CROSS/BLUE SHIELD

## 2017-12-30 ENCOUNTER — Other Ambulatory Visit (HOSPITAL_COMMUNITY): Payer: BLUE CROSS/BLUE SHIELD

## 2018-01-07 ENCOUNTER — Other Ambulatory Visit: Payer: Self-pay | Admitting: Family Medicine

## 2018-01-12 DIAGNOSIS — M79642 Pain in left hand: Secondary | ICD-10-CM | POA: Diagnosis not present

## 2018-01-12 DIAGNOSIS — M654 Radial styloid tenosynovitis [de Quervain]: Secondary | ICD-10-CM | POA: Diagnosis not present

## 2018-01-12 DIAGNOSIS — G5603 Carpal tunnel syndrome, bilateral upper limbs: Secondary | ICD-10-CM | POA: Diagnosis not present

## 2018-01-12 DIAGNOSIS — M79641 Pain in right hand: Secondary | ICD-10-CM | POA: Diagnosis not present

## 2018-01-19 DIAGNOSIS — G43719 Chronic migraine without aura, intractable, without status migrainosus: Secondary | ICD-10-CM | POA: Diagnosis not present

## 2018-01-21 DIAGNOSIS — G43711 Chronic migraine without aura, intractable, with status migrainosus: Secondary | ICD-10-CM | POA: Diagnosis not present

## 2018-02-17 ENCOUNTER — Other Ambulatory Visit: Payer: Self-pay | Admitting: Family Medicine

## 2018-02-22 ENCOUNTER — Telehealth: Payer: Self-pay | Admitting: Family Medicine

## 2018-02-22 ENCOUNTER — Other Ambulatory Visit: Payer: Self-pay | Admitting: Family Medicine

## 2018-02-22 NOTE — Telephone Encounter (Signed)
[place] Cb#: [phone]  Patient submitted request for refill fioricet generic, the pharmacy states it was denied.  Patient is calling to inquire. Please advise.

## 2018-02-23 ENCOUNTER — Other Ambulatory Visit: Payer: Self-pay

## 2018-02-23 ENCOUNTER — Ambulatory Visit (HOSPITAL_COMMUNITY): Payer: BLUE CROSS/BLUE SHIELD | Admitting: Adult Health

## 2018-02-23 ENCOUNTER — Other Ambulatory Visit (HOSPITAL_COMMUNITY): Payer: BLUE CROSS/BLUE SHIELD

## 2018-02-23 NOTE — Telephone Encounter (Signed)
Resent to [facility] in [place]

## 2018-03-03 ENCOUNTER — Ambulatory Visit: Payer: BLUE CROSS/BLUE SHIELD | Admitting: Family Medicine

## 2018-03-07 ENCOUNTER — Inpatient Hospital Stay (HOSPITAL_BASED_OUTPATIENT_CLINIC_OR_DEPARTMENT_OTHER): Payer: BLUE CROSS/BLUE SHIELD | Admitting: Internal Medicine

## 2018-03-07 ENCOUNTER — Inpatient Hospital Stay (HOSPITAL_COMMUNITY): Payer: BLUE CROSS/BLUE SHIELD | Attending: Hematology

## 2018-03-07 ENCOUNTER — Other Ambulatory Visit: Payer: Self-pay

## 2018-03-07 ENCOUNTER — Encounter (HOSPITAL_COMMUNITY): Payer: Self-pay | Admitting: Internal Medicine

## 2018-03-07 VITALS — BP 109/69 | HR 77 | Temp 98.7°F | Resp 16

## 2018-03-07 DIAGNOSIS — D509 Iron deficiency anemia, unspecified: Secondary | ICD-10-CM | POA: Diagnosis not present

## 2018-03-07 DIAGNOSIS — E538 Deficiency of other specified B group vitamins: Secondary | ICD-10-CM

## 2018-03-07 DIAGNOSIS — N920 Excessive and frequent menstruation with regular cycle: Secondary | ICD-10-CM | POA: Diagnosis not present

## 2018-03-07 DIAGNOSIS — D508 Other iron deficiency anemias: Secondary | ICD-10-CM

## 2018-03-07 LAB — CBC WITH DIFFERENTIAL/PLATELET
Basophils Absolute: 0.1 10*3/uL (ref 0.0–0.1)
Basophils Relative: 1 %
Eosinophils Absolute: 0.3 10*3/uL (ref 0.0–0.7)
Eosinophils Relative: 3 %
HCT: 38.2 % (ref 36.0–46.0)
Hemoglobin: 12.5 g/dL (ref 12.0–15.0)
Lymphocytes Relative: 23 %
Lymphs Abs: 2 10*3/uL (ref 0.7–4.0)
MCH: 29.5 pg (ref 26.0–34.0)
MCHC: 32.7 g/dL (ref 30.0–36.0)
MCV: 90.1 fL (ref 78.0–100.0)
Monocytes Absolute: 0.5 10*3/uL (ref 0.1–1.0)
Monocytes Relative: 5 %
Neutro Abs: 6 10*3/uL (ref 1.7–7.7)
Neutrophils Relative %: 68 %
Platelets: 295 10*3/uL (ref 150–400)
RBC: 4.24 MIL/uL (ref 3.87–5.11)
RDW: 13.5 % (ref 11.5–15.5)
WBC: 8.8 10*3/uL (ref 4.0–10.5)

## 2018-03-07 LAB — BASIC METABOLIC PANEL
Anion gap: 9 (ref 5–15)
BUN: 11 mg/dL (ref 6–20)
CO2: 29 mmol/L (ref 22–32)
Calcium: 8.7 mg/dL — ABNORMAL LOW (ref 8.9–10.3)
Chloride: 102 mmol/L (ref 101–111)
Creatinine, Ser: 0.78 mg/dL (ref 0.44–1.00)
GFR calc Af Amer: >60 mL/min (ref >60)
GFR calc non Af Amer: >60 mL/min (ref >60)
Glucose, Bld: 90 mg/dL (ref 65–99)
Potassium: 3.5 mmol/L (ref 3.5–5.1)
Sodium: 140 mmol/L (ref 135–145)

## 2018-03-07 NOTE — Progress Notes (Signed)
Diagnosis Other iron deficiency anemia - Plan: CBC with Differential/Platelet, Ferritin, CBC with Differential/Platelet, Comprehensive metabolic panel, Lactate dehydrogenase, Ferritin  Staging Cancer Staging No matching staging information was found for the patient.  Assessment and Plan:  1.  IDA.  Labs done today 03/07/2018 show WBC 8.8 HB 12.5 and plts 295,000.  Iron studies are pending.  She will be notified of results and pt has been treated in the past with IV iron  if ferritin was less than 100.  Recent Ferritin in 10/2017 was 246.  She will RTC for repeat labs in 05/2018 and will follow-up in 09/2018 pending results.    2.  B12  Deficiency.  Continue B12 supplements as directed.    3.  Pending IVF  She reports she was treated in the past with IV iron during pregnancy.    4.  Menorrhagia.  She reports she has irregular cycles but has not been told she has fibroids.  Follow-up with GYN as recommended.    Interval history: 32 yr old female with IDA.  She has H/O gastric bypass and irregular menses.  She had to be treated with IV iron during pregnancy.    Current Status:  Pt is seen today for follow-up to go over labs.  She reports fatigue.  She reports she has a 82 month old son.  She is planning to begin IVF in August, 2019.  She reports she has been seen by Dr. Laural Golden of GI.    Problem List Patient Active Problem List   Diagnosis Date Noted  . Sinusitis nasal [J32.9] 11/07/2017  . S/P cesarean section [Z98.891] 05/26/2017  . Migraine headache with aura [G43.109] 06/05/2014  . Asthma [J45.909] 11/08/2013  . Eosinophil count raised [D72.1] 11/08/2013  . Iron deficiency [E61.1] 11/08/2013  . Anemia [D64.9] 09/07/2013  . Abnormal antibody titer--Strongyloides [R76.0] 08/04/2013  . Low vitamin B12 level [E53.8] 07/10/2013  . GAD (generalized anxiety disorder) [F41.1] 06/22/2013  . Insomnia [G47.00] 08/09/2012  . Vitamin D deficiency [E55.9] 08/02/2012  . Bronchitis [J40] 12/31/2011   . Iron deficiency anemia [D50.9] 10/02/2010  . Atrial flutter ([place]) [I48.92] 08/26/2009  . 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 Palpitations [R00.2] 07/15/2009  . BIPOLAR II DISORDER [F31.89] 07/12/2009  . Morbid obesity ([place]) [E66.01] 01/09/2008  . ANXIETY, CHRONIC [F41.1] 01/09/2008  . Asthma, mild intermittent, well-controlled [J45.20] 01/09/2008  . Asthma, well controlled [J45.909] 01/09/2008    Past Medical History Past Medical History:  Diagnosis Date  . Asthma   . Atrial flutter ([place])    Status post RFA by Dr. Lovena Le in 2004  . Chronic anxiety   . Chronic depression   . 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 Iron deficiency   . Iron deficiency anemia 10/02/2010   Qualifier: Diagnosis of  By: Moshe Cipro MD, Joycelyn Schmid    . Low vitamin B12 level 07/10/2013   Overview:  Last Assessment & Plan:  Being treated through hematology  . Newborn product of in vitro fertilization (IVF) pregnancy   . Obesity     Past Surgical History Past Surgical History:  Procedure Laterality Date  . Bilateral eustachian tube placement on 4 different occasions    . CARDIAC ELECTROPHYSIOLOGY MAPPING AND ABLATION    . CESAREAN SECTION N/A 05/26/2017   Procedure: CESAREAN SECTION;  Surgeon: Linda Hedges, DO;  Location: [facility];  Service: Obstetrics;  Laterality: N/A;  . CHOLECYSTECTOMY  2003  . COLONOSCOPY N/A 11/29/2013   Procedure: COLONOSCOPY;  Surgeon: Rogene Houston, MD;  [facility];  Service: Endoscopy;  Laterality: N/A;  120-rescheduled to 300 Ann notified pt  . GASTRIC BYPASS  2006  . TONSILLECTOMY  1990    Family History Family History  Problem Relation Age of Onset  . Fibromyalgia Mother        Chronic pelvic pain   . Hypothyroidism Mother   . Obesity Mother   . Heart defect Mother        Supraventricular, tachycardia /tachypalpation   . Arrhythmia Mother   . Breast cancer Mother 34  . Hypertension Maternal Grandmother   . Diabetes Maternal Grandmother   .  Hypertension Maternal Grandfather   . Heart disease Paternal Grandmother   . Hypertension Paternal Grandmother   . Heart disease Paternal Grandfather   . Hypertension Paternal Grandfather      Social History  reports that she quit smoking about 2 years ago. Her smoking use included cigarettes. She started smoking about 4 years ago. She smoked 0.50 packs per day. She has never used smokeless tobacco. She reports that she does not drink alcohol or use drugs.  Medications  Current Outpatient Medications:  .  butalbital-acetaminophen-caffeine (FIORICET, ESGIC) 50-325-40 MG tablet, butalbital-acetaminophen-caffeine 50 mg-325 mg-40 mg tablet, Disp: , Rfl:  .  cyanocobalamin (,VITAMIN B-12,) 1000 MCG/ML injection, , Disp: , Rfl: 3 .  Cyanocobalamin 1000 MCG/ML KIT, Inject as directed. 1 injection monthly, Disp: , Rfl:  .  ferrous sulfate 300 (60 Fe) MG/5ML syrup, Take 5 mLs (300 mg total) by mouth 2 (two) times daily with a meal. Mix in 4oz of orange juice, Disp: 150 mL, Rfl: 3 .  metoprolol succinate (TOPROL-XL) 25 MG 24 hr tablet, TAKE 1 TABLET BY MOUTH DAILY, Disp: 90 tablet, Rfl: 0 .  Prenatal Vit-Fe Fumarate-FA (PRENATAL MULTIVITAMIN) TABS tablet, Take 1 tablet by mouth 2 (two) times daily., Disp: , Rfl:  .  topiramate (TOPAMAX) 50 MG tablet, Take 1 tablet (50 mg total) by mouth 2 (two) times daily., Disp: 180 tablet, Rfl: 1  Allergies Ciprofloxacin; Morphine and related; and Penicillins  Review of Systems Review of Systems - Oncology ROS as per HPI otherwise 12 point ROS is negative.   Physical Exam  Vitals Wt Readings from Last 3 Encounters:  11/03/17 212 lb (96.2 kg)  09/01/17 216 lb 14.4 oz (98.4 kg)  05/25/17 244 lb (110.7 kg)   Temp Readings from Last 3 Encounters:  03/07/18 98.7 F (37.1 C) (Oral)  09/01/17 98.5 F (36.9 C) (Oral)  06/16/17 98.6 F (37 C) (Oral)   BP Readings from Last 3 Encounters:  03/07/18 109/69  11/03/17 118/80  09/01/17 (!) 116/52   Pulse  Readings from Last 3 Encounters:  03/07/18 77  11/03/17 94  09/01/17 75   Constitutional: Well-developed, well-nourished, and in no distress.   HENT: Head: Normocephalic and atraumatic.  Mouth/Throat: No oropharyngeal exudate. Mucosa moist. Eyes: Pupils are equal, round, and reactive to light. Conjunctivae are normal. No scleral icterus.  Neck: Normal range of motion. Neck supple. No JVD present.  Cardiovascular: Normal rate, regular rhythm and normal heart sounds.  Exam reveals no gallop and no friction rub.   No murmur heard. Pulmonary/Chest: Effort normal and breath sounds normal. No respiratory distress. No wheezes.No rales.  Abdominal: Soft. Bowel sounds are normal. No distension. There is no tenderness. There is no guarding.  Musculoskeletal: No edema or tenderness.  Lymphadenopathy: No cervical, axillary or supraclavicular adenopathy.  Neurological: Alert and oriented to person, place, and time. No cranial nerve deficit.  Skin: Skin is warm and dry. No rash noted. No erythema. No pallor.  Psychiatric: Affect and judgment normal.   Labs Appointment on 03/07/2018  Component Date Value Ref Range Status  . WBC 03/07/2018 8.8  4.0 - 10.5 K/uL Final  . RBC 03/07/2018 4.24  3.87 - 5.11 MIL/uL Final  . Hemoglobin 03/07/2018 12.5  12.0 - 15.0 g/dL Final  . HCT 03/07/2018 38.2  36.0 - 46.0 % Final  . MCV 03/07/2018 90.1  78.0 - 100.0 fL Final  . MCH 03/07/2018 29.5  26.0 - 34.0 pg Final  . MCHC 03/07/2018 32.7  30.0 - 36.0 g/dL Final  . RDW 03/07/2018 13.5  11.5 - 15.5 % Final  . Platelets 03/07/2018 295  150 - 400 K/uL Final  . Neutrophils Relative % 03/07/2018 68  % Final  . Neutro Abs 03/07/2018 6.0  1.7 - 7.7 K/uL Final  . Lymphocytes Relative 03/07/2018 23  % Final  . Lymphs Abs 03/07/2018 2.0  0.7 - 4.0 K/uL Final  . Monocytes Relative 03/07/2018 5  % Final  . Monocytes Absolute 03/07/2018 0.5  0.1 - 1.0 K/uL Final  . Eosinophils Relative 03/07/2018 3  % Final  . Eosinophils  Absolute 03/07/2018 0.3  0.0 - 0.7 K/uL Final  . Basophils Relative 03/07/2018 1  % Final  . Basophils Absolute 03/07/2018 0.1  0.0 - 0.1 K/uL Final   Performed at [facility], [address] [place], [place] [address]  . Sodium 03/07/2018 140  135 - 145 mmol/L Final  . Potassium 03/07/2018 3.5  3.5 - 5.1 mmol/L Final  . Chloride 03/07/2018 102  101 - 111 mmol/L Final  . CO2 03/07/2018 29  22 - 32 mmol/L Final  . Glucose, Bld 03/07/2018 90  65 - 99 mg/dL Final  . BUN 03/07/2018 11  6 - 20 mg/dL Final  . Creatinine, Ser 03/07/2018 0.78  0.44 - 1.00 mg/dL Final  . Calcium 03/07/2018 8.7* 8.9 - 10.3 mg/dL Final  . GFR calc non Af Amer 03/07/2018 >60  >60 mL/min Final  . GFR calc Af Amer 03/07/2018 >60  >60 mL/min Final   Comment: (NOTE) The eGFR has been calculated using the CKD EPI equation. This calculation has not been validated in all clinical situations. eGFR's persistently <60 mL/min signify possible Chronic Kidney Disease.   Georgiann Hahn gap 03/07/2018 9  5 - 15 Final   Performed at [facility], [address], [place], [place] [address]     Pathology Orders Placed This Encounter  Procedures  . CBC with Differential/Platelet    Standing Status:   Future    Standing Expiration Date:   03/08/2019  . Ferritin    Standing Status:   Future    Standing Expiration Date:   03/08/2019  . CBC with Differential/Platelet    Standing Status:   Future    Standing Expiration Date:   03/08/2019  . Comprehensive metabolic panel    Standing Status:   Future    Standing Expiration Date:   03/08/2019  . Lactate dehydrogenase    Standing Status:   Future    Standing Expiration Date:   03/08/2019  . Ferritin    Standing Status:   Future    Standing Expiration Date:   03/08/2019       Zoila Shutter MD

## 2018-03-08 ENCOUNTER — Other Ambulatory Visit (HOSPITAL_COMMUNITY): Payer: Self-pay | Admitting: Internal Medicine

## 2018-03-08 ENCOUNTER — Telehealth (HOSPITAL_COMMUNITY): Payer: Self-pay | Admitting: *Deleted

## 2018-03-08 LAB — VITAMIN B12: Vitamin B-12: 154 pg/mL — ABNORMAL LOW (ref 180–914)

## 2018-03-08 LAB — FERRITIN: Ferritin: 31 ng/mL (ref 11–307)

## 2018-03-08 LAB — IRON AND TIBC
Iron: 42 ug/dL (ref 28–170)
Saturation Ratios: 12 % (ref 10.4–31.8)
TIBC: 337 ug/dL (ref 250–450)
UIBC: 295 ug/dL

## 2018-03-08 NOTE — Telephone Encounter (Signed)
LMOM that Dr. Walden Field wanted the pt to be scheduled for 2 doses of injectafer after getting her labs back. I advised the pt that our scheduler would be getting in touch with her to make her appointments. Also for the pt to call us back if she has any questions.

## 2018-03-14 DIAGNOSIS — G43711 Chronic migraine without aura, intractable, with status migrainosus: Secondary | ICD-10-CM | POA: Diagnosis not present

## 2018-03-14 DIAGNOSIS — Z79899 Other long term (current) drug therapy: Secondary | ICD-10-CM | POA: Diagnosis not present

## 2018-03-14 DIAGNOSIS — E668 Other obesity: Secondary | ICD-10-CM | POA: Diagnosis not present

## 2018-03-18 ENCOUNTER — Encounter (HOSPITAL_COMMUNITY): Payer: Self-pay

## 2018-03-18 ENCOUNTER — Other Ambulatory Visit: Payer: Self-pay

## 2018-03-18 ENCOUNTER — Inpatient Hospital Stay (HOSPITAL_COMMUNITY): Payer: BLUE CROSS/BLUE SHIELD

## 2018-03-18 VITALS — BP 91/56 | HR 72 | Temp 98.1°F | Resp 18

## 2018-03-18 DIAGNOSIS — E538 Deficiency of other specified B group vitamins: Secondary | ICD-10-CM | POA: Diagnosis not present

## 2018-03-18 DIAGNOSIS — D508 Other iron deficiency anemias: Secondary | ICD-10-CM

## 2018-03-18 DIAGNOSIS — N920 Excessive and frequent menstruation with regular cycle: Secondary | ICD-10-CM | POA: Diagnosis not present

## 2018-03-18 DIAGNOSIS — D509 Iron deficiency anemia, unspecified: Secondary | ICD-10-CM | POA: Diagnosis not present

## 2018-03-18 MED ORDER — SODIUM CHLORIDE 0.9 % IV SOLN
750.0000 mg | Freq: Once | INTRAVENOUS | Status: AC
  Administered 2018-03-18: 750 mg via INTRAVENOUS
  Filled 2018-03-18: qty 15

## 2018-03-18 MED ORDER — SODIUM CHLORIDE 0.9 % IV SOLN
INTRAVENOUS | Status: DC
  Administered 2018-03-18: 15:00:00 via INTRAVENOUS

## 2018-03-18 NOTE — Progress Notes (Signed)
Tolerated infusion w/o adverse reaction.  Alert, in no distress.  VSS.  Discharged ambulatory.  

## 2018-03-25 ENCOUNTER — Ambulatory Visit (HOSPITAL_COMMUNITY): Payer: BLUE CROSS/BLUE SHIELD

## 2018-03-31 ENCOUNTER — Encounter (HOSPITAL_COMMUNITY): Payer: Self-pay

## 2018-03-31 ENCOUNTER — Other Ambulatory Visit: Payer: Self-pay

## 2018-03-31 ENCOUNTER — Inpatient Hospital Stay (HOSPITAL_COMMUNITY): Payer: BLUE CROSS/BLUE SHIELD

## 2018-03-31 VITALS — BP 106/54 | HR 89 | Temp 98.8°F | Resp 18

## 2018-03-31 DIAGNOSIS — N920 Excessive and frequent menstruation with regular cycle: Secondary | ICD-10-CM | POA: Diagnosis not present

## 2018-03-31 DIAGNOSIS — D509 Iron deficiency anemia, unspecified: Secondary | ICD-10-CM | POA: Diagnosis not present

## 2018-03-31 DIAGNOSIS — E538 Deficiency of other specified B group vitamins: Secondary | ICD-10-CM | POA: Diagnosis not present

## 2018-03-31 DIAGNOSIS — D508 Other iron deficiency anemias: Secondary | ICD-10-CM

## 2018-03-31 MED ORDER — SODIUM CHLORIDE 0.9 % IV SOLN
750.0000 mg | Freq: Once | INTRAVENOUS | Status: AC
  Administered 2018-03-31: 750 mg via INTRAVENOUS
  Filled 2018-03-31: qty 15

## 2018-03-31 MED ORDER — SODIUM CHLORIDE 0.9 % IV SOLN
INTRAVENOUS | Status: DC
  Administered 2018-03-31: 15:00:00 via INTRAVENOUS

## 2018-03-31 NOTE — Progress Notes (Signed)
Tolerated infusion w/o adverse reaction.  Alert, in no distress.  VSS.  Discharged ambulatory.  

## 2018-04-10 ENCOUNTER — Other Ambulatory Visit: Payer: Self-pay | Admitting: Family Medicine

## 2018-04-11 ENCOUNTER — Ambulatory Visit: Payer: BLUE CROSS/BLUE SHIELD | Admitting: Family Medicine

## 2018-04-14 DIAGNOSIS — G5603 Carpal tunnel syndrome, bilateral upper limbs: Secondary | ICD-10-CM | POA: Diagnosis not present

## 2018-04-25 DIAGNOSIS — G5602 Carpal tunnel syndrome, left upper limb: Secondary | ICD-10-CM | POA: Diagnosis not present

## 2018-04-25 DIAGNOSIS — G5601 Carpal tunnel syndrome, right upper limb: Secondary | ICD-10-CM | POA: Diagnosis not present

## 2018-04-28 DIAGNOSIS — Z3169 Encounter for other general counseling and advice on procreation: Secondary | ICD-10-CM | POA: Diagnosis not present

## 2018-05-02 DIAGNOSIS — G5602 Carpal tunnel syndrome, left upper limb: Secondary | ICD-10-CM | POA: Diagnosis not present

## 2018-05-02 DIAGNOSIS — G5601 Carpal tunnel syndrome, right upper limb: Secondary | ICD-10-CM | POA: Diagnosis not present

## 2018-05-02 HISTORY — PX: CARPAL TUNNEL RELEASE: SHX101

## 2018-05-06 ENCOUNTER — Inpatient Hospital Stay (HOSPITAL_COMMUNITY): Payer: BLUE CROSS/BLUE SHIELD | Attending: Hematology

## 2018-05-06 DIAGNOSIS — D509 Iron deficiency anemia, unspecified: Secondary | ICD-10-CM | POA: Diagnosis not present

## 2018-05-06 DIAGNOSIS — D508 Other iron deficiency anemias: Secondary | ICD-10-CM

## 2018-05-06 LAB — CBC WITH DIFFERENTIAL/PLATELET
Basophils Absolute: 0 10*3/uL (ref 0.0–0.1)
Basophils Relative: 1 %
Eosinophils Absolute: 0.3 10*3/uL (ref 0.0–0.7)
Eosinophils Relative: 4 %
HCT: 37.8 % (ref 36.0–46.0)
Hemoglobin: 12.5 g/dL (ref 12.0–15.0)
Lymphocytes Relative: 24 %
Lymphs Abs: 1.9 10*3/uL (ref 0.7–4.0)
MCH: 30.2 pg (ref 26.0–34.0)
MCHC: 33.1 g/dL (ref 30.0–36.0)
MCV: 91.3 fL (ref 78.0–100.0)
Monocytes Absolute: 0.3 10*3/uL (ref 0.1–1.0)
Monocytes Relative: 4 %
Neutro Abs: 5.4 10*3/uL (ref 1.7–7.7)
Neutrophils Relative %: 67 %
Platelets: 251 10*3/uL (ref 150–400)
RBC: 4.14 MIL/uL (ref 3.87–5.11)
RDW: 13.9 % (ref 11.5–15.5)
WBC: 8 10*3/uL (ref 4.0–10.5)

## 2018-05-06 LAB — FERRITIN: Ferritin: 331 ng/mL — ABNORMAL HIGH (ref 11–307)

## 2018-05-10 ENCOUNTER — Telehealth: Payer: Self-pay

## 2018-05-10 DIAGNOSIS — Z13220 Encounter for screening for lipoid disorders: Secondary | ICD-10-CM

## 2018-05-10 DIAGNOSIS — E559 Vitamin D deficiency, unspecified: Secondary | ICD-10-CM

## 2018-05-10 NOTE — Telephone Encounter (Signed)
Labs ordered.

## 2018-05-11 DIAGNOSIS — G43719 Chronic migraine without aura, intractable, without status migrainosus: Secondary | ICD-10-CM | POA: Diagnosis not present

## 2018-05-11 DIAGNOSIS — Z3141 Encounter for fertility testing: Secondary | ICD-10-CM | POA: Diagnosis not present

## 2018-05-11 LAB — TSH: TSH: 2.45 mIU/L

## 2018-05-11 LAB — LIPID PANEL
Cholesterol: 172 mg/dL (ref <200)
HDL: 63 mg/dL (ref >50)
LDL Cholesterol (Calc): 90 mg/dL (calc)
Non-HDL Cholesterol (Calc): 109 mg/dL (calc) (ref <130)
Total CHOL/HDL Ratio: 2.7 (calc) (ref <5.0)
Triglycerides: 94 mg/dL (ref <150)

## 2018-05-11 LAB — VITAMIN D 25 HYDROXY (VIT D DEFICIENCY, FRACTURES): Vit D, 25-Hydroxy: 33 ng/mL (ref 30–100)

## 2018-05-12 ENCOUNTER — Encounter: Payer: Self-pay | Admitting: Family Medicine

## 2018-05-12 ENCOUNTER — Other Ambulatory Visit: Payer: Self-pay

## 2018-05-12 ENCOUNTER — Ambulatory Visit (INDEPENDENT_AMBULATORY_CARE_PROVIDER_SITE_OTHER): Payer: BLUE CROSS/BLUE SHIELD | Admitting: Family Medicine

## 2018-05-12 VITALS — BP 124/68 | HR 94 | Resp 14 | Ht 61.0 in | Wt 200.1 lb

## 2018-05-12 DIAGNOSIS — Z3141 Encounter for fertility testing: Secondary | ICD-10-CM | POA: Diagnosis not present

## 2018-05-12 DIAGNOSIS — I4892 Unspecified atrial flutter: Secondary | ICD-10-CM

## 2018-05-12 DIAGNOSIS — D508 Other iron deficiency anemias: Secondary | ICD-10-CM

## 2018-05-12 DIAGNOSIS — G43109 Migraine with aura, not intractable, without status migrainosus: Secondary | ICD-10-CM

## 2018-05-12 DIAGNOSIS — B379 Candidiasis, unspecified: Secondary | ICD-10-CM

## 2018-05-12 MED ORDER — TOPIRAMATE 50 MG PO TABS: 50.0000 mg | ORAL_TABLET | Freq: Two times a day (BID) | ORAL | 3 refills | Status: DC

## 2018-05-12 MED ORDER — NYSTATIN 100000 UNIT/GM EX POWD: CUTANEOUS | 1 refills | Status: DC

## 2018-05-12 MED ORDER — BUTALBITAL-APAP-CAFFEINE 50-325-40 MG PO TABS
ORAL_TABLET | ORAL | 5 refills | Status: DC
Start: 2018-05-12 — End: 2018-08-12

## 2018-05-12 NOTE — Patient Instructions (Addendum)
F/U in 6 months, please call if you need me before ( appt card will be mailed)  Form as completed with labs.  Keep up good health habits and weight  Loss  Reduce Topamax to one at bedtime  Limit Fioricet to two per week ( max)  I recommend one hand surgery at a time  Nystatin powder is prescribed

## 2018-05-13 ENCOUNTER — Encounter: Payer: Self-pay | Admitting: Family Medicine

## 2018-05-13 DIAGNOSIS — B379 Candidiasis, unspecified: Secondary | ICD-10-CM | POA: Insufficient documentation

## 2018-05-13 DIAGNOSIS — G43711 Chronic migraine without aura, intractable, with status migrainosus: Secondary | ICD-10-CM | POA: Diagnosis not present

## 2018-05-13 NOTE — Assessment & Plan Note (Signed)
Managed by hematology with infusions , excellent response to current treatment

## 2018-05-13 NOTE — Progress Notes (Signed)
DANNAE KATO     MRN: [id number]      DOB: 05-06-1986   HPI Alejandra Barr is here for follow up and re-evaluation of chronic medical conditions, medication management and review of any available recent lab and radiology data.  Preventive health is updated, specifically  Cancer screening and Immunization.   Questions or concerns regarding consultations or procedures which the PT has had in the interim are  Addressed.She has severe bilateral carpal tunnel surgery requiring surgery, was planning to have both hands done simultaneously,next week,  however after discussion at today's visit, I believe she will space them out C/o Topamax making her have a drunk feeling and interested in reducing the dose, sh has on average one headache per week, she currently gets Botox njections for prophylaxis , she uses on average 2 fioricet tablets weekly to manage her headaches when they occur  She is working on weight loss and food choice , she is hoping for more weight loss success. States she will need cardiology clearance for her out patient carpal tunnel release  Recently received new treatment for her low iron stores through hematology clinic and is happy to see her ferritin level at the highest it has ever been Has a form to be completed which requires recent lab values for lipid and fasting blood sugar to be completed for insurance bonus points   ROS Denies recent fever or chills. Denies sinus pressure, nasal congestion, ear pain or sore throat. Denies chest congestion, productive cough or wheezing. Denies chest pains, palpitations and leg swelling Denies abdominal pain, nausea, vomiting,diarrhea or constipation.   Denies dysuria, frequency, hesitancy or incontinence.  Denies depression, anxiety or insomnia. C/o fungal rash on lower abdominal fold , especially in the Summer months and requests nystatin powder for as needed use PE  BP 124/68 (BP Location: Left Arm, Patient Position: Sitting, Cuff  Size: Large)   Pulse 94   Ht 5\' 1"  (1.549 m)   Wt 200 lb 1.3 oz (90.8 kg)   SpO2 98%   BMI 37.80 kg/m   Patient alert and oriented and in no cardiopulmonary distress.  HEENT: No facial asymmetry, EOMI,   oropharynx pink and moist.  Neck supple no JVD, no mass.  Chest: Clear to auscultation bilaterally.  CVS: S1, S2 no murmurs, no S3.Regular rate.  ABD: Soft non tender.   Ext: No edema  MS: Adequate ROM spine, shoulders, hips and knees.  Skin: Intact, lower abdominal fold not examined  Psych: Good eye contact, normal affect. Memory intact not anxious or depressed appearing.  CNS: CN 2-12 intact, power,  normal throughout.no focal deficits noted.   Assessment & Plan  Migraine headache with aura Uncontrolled with once weekly headaches reported while  Receiving Botox. Also reports "drunk " feeling on topamax, reduce topamax to 50 mg daily,a nd prescribe fioricet tabs 8/ month for heaache  Atrial flutter ([facility]) Controlled on current dose of toprol , continue same  Iron deficiency anemia Managed by hematology with infusions , excellent response to current treatment   Morbid obesity ([place]) Improved, pt is applauded on this and encouraged to continue same. Patient re-educated about  the importance of commitment to a  minimum of 150 minutes of exercise per week.  The importance of healthy food choices with portion control discussed. Encouraged to start a food diary, count calories and to consider  joining a support group. Sample diet sheets offered. Goals set by the patient for the next several months.   Weight /  BMI 05/12/2018 11/03/2017 09/01/2017  WEIGHT 200 lb 1.3 oz 212 lb 216 lb 14.4 oz  HEIGHT 5\' 1"  5\' 2"  -  BMI 37.8 kg/m2 38.78 kg/m2 39.67 kg/m2      Candidiasis Topical nystatin powder twice daily for 1 week , then as needed

## 2018-05-13 NOTE — Assessment & Plan Note (Signed)
Topical nystatin powder twice daily for 1 week , then as needed

## 2018-05-13 NOTE — Assessment & Plan Note (Signed)
Uncontrolled with once weekly headaches reported while  Receiving Botox. Also reports "drunk " feeling on topamax, reduce topamax to 50 mg daily,a nd prescribe fioricet tabs 8/ month for heaache

## 2018-05-13 NOTE — Assessment & Plan Note (Signed)
Improved, pt is applauded on this and encouraged to continue same. Patient re-educated about  the importance of commitment to a  minimum of 150 minutes of exercise per week.  The importance of healthy food choices with portion control discussed. Encouraged to start a food diary, count calories and to consider  joining a support group. Sample diet sheets offered. Goals set by the patient for the next several months.   Weight /BMI 05/12/2018 11/03/2017 09/01/2017  WEIGHT 200 lb 1.3 oz 212 lb 216 lb 14.4 oz  HEIGHT 5\' 1"  5\' 2"  -  BMI 37.8 kg/m2 38.78 kg/m2 39.67 kg/m2

## 2018-05-13 NOTE — Assessment & Plan Note (Signed)
Controlled on current dose of toprol , continue same

## 2018-05-18 ENCOUNTER — Other Ambulatory Visit: Payer: Self-pay

## 2018-05-18 ENCOUNTER — Ambulatory Visit (INDEPENDENT_AMBULATORY_CARE_PROVIDER_SITE_OTHER): Payer: BLUE CROSS/BLUE SHIELD | Admitting: Cardiovascular Disease

## 2018-05-18 ENCOUNTER — Encounter: Payer: Self-pay | Admitting: Cardiovascular Disease

## 2018-05-18 VITALS — BP 111/71 | HR 80 | Ht 61.0 in | Wt 201.0 lb

## 2018-05-18 DIAGNOSIS — I4892 Unspecified atrial flutter: Secondary | ICD-10-CM

## 2018-05-18 DIAGNOSIS — Z01818 Encounter for other preprocedural examination: Secondary | ICD-10-CM | POA: Diagnosis not present

## 2018-05-18 DIAGNOSIS — R002 Palpitations: Secondary | ICD-10-CM

## 2018-05-18 MED ORDER — METOPROLOL SUCCINATE ER 25 MG PO TB24: 37.5000 mg | ORAL_TABLET | Freq: Every day | ORAL | 1 refills | Status: DC

## 2018-05-18 NOTE — Patient Instructions (Signed)
Your physician recommends that you schedule a follow-up appointment in: [place] physician has recommended you make the following change in your medication:   INCREASE TOPROL XL 37.5 MG DAILY (1 AND 1/2 TABLETS)  Thank you for choosing [facility]!!

## 2018-05-18 NOTE — Progress Notes (Signed)
CARDIOLOGY CONSULT NOTE  Patient ID: Alejandra Barr MRN: [id number] DOB/AGE: 33-Nov-1987 33 y.o.  Admit date: (Not on file) Primary Physician: Fayrene Helper, MD Referring Physician: Dr. [name]  Reason for Consultation: Arrhythmia and preoperative evaluation  HPI: Alejandra Barr is a 32 y.o. female who is being seen today for the evaluation of arrhythmia and preoperative evaluation at the request of Fayrene Helper, MD.   She has a previous history of atrial flutter and radiofrequency ablation.  She underwent radiofrequency ablation of atrial flutter by Dr. Lovena Le in 2004.   She is planning to undergo bilateral carpal tunnel surgery within the next several weeks.  Additional past medical history includes iron deficiency anemia, migraines, and morbid obesity.  ECG performed in the office today which I ordered and personally interpreted demonstrates normal sinus rhythm with no ischemic ST segment or T-wave abnormalities, nor any arrhythmias.  She experiences palpitations about twice per week.  While they are not associated with chest pain, shortness of breath, lightheadedness, or dizziness, she said "they make me feel uneasy".  She denies orthopnea and leg swelling.  She requests to wear monitor at some point after her surgery.  She has an 41-monthold baby conceived by in vitro fertilization and has 8 frozen embryos and would like to become pregnant again.  She told me her TSH was 2.67 and the reproductive endocrinologist wants it 2.5 or less as there is an increased rate of miscarriages when TSH is greater than 2.5.    Allergies  Allergen Reactions  . Ciprofloxacin Anaphylaxis  . Morphine And Related Anaphylaxis  . Oseltamivir Hives  . Penicillins Anaphylaxis    Has patient had a PCN reaction causing immediate rash, facial/tongue/throat swelling, SOB or lightheadedness with hypotension: No Has patient had a PCN reaction causing severe rash  involving mucus membranes or skin necrosis: No Has patient had a PCN reaction that required hospitalization: No Has patient had a PCN reaction occurring within the last 10 years: No If all of the above answers are "NO", then may proceed with Cephalosporin use.    Current Outpatient Medications  Medication Sig Dispense Refill  . butalbital-acetaminophen-caffeine (FIORICET, ESGIC) 50-325-40 MG tablet One tablet two times daily as needed for migraine headache Maximum use is two times weekly 8 tablet 5  . Cyanocobalamin 1000 MCG/ML KIT Inject 1,000 mcg as directed every 30 (thirty) days.    . ferrous sulfate 300 (60 Fe) MG/5ML syrup Take 5 mLs (300 mg total) by mouth 2 (two) times daily with a meal. Mix in 4oz of orange juice 150 mL 3  . levothyroxine (SYNTHROID, LEVOTHROID) 25 MCG tablet Take 25 mcg by mouth daily.    . metoprolol succinate (TOPROL-XL) 25 MG 24 hr tablet TAKE 1 TABLET BY MOUTH DAILY 90 tablet 0  . nystatin (MYCOSTATIN/NYSTOP) powder Apply topically to rash twice daily  for 1 week , then as needed 60 g 1  . Prenatal Vit-Fe Fumarate-FA (PRENATAL MULTIVITAMIN) TABS tablet Take 1 tablet by mouth 2 (two) times daily.    .Marland Kitchentopiramate (TOPAMAX) 50 MG tablet Take 1 tablet (50 mg total) by mouth 2 (two) times daily. One tablet at bedtime 90 tablet 3   No current facility-administered medications for this visit.     Past Medical History:  Diagnosis Date  . Asthma   [place]    Status post RFA by Dr. TLovena Lein 2004  . Chronic anxiety   . Chronic depression   .  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 Iron deficiency   . Iron deficiency anemia 10/02/2010   Qualifier: Diagnosis of  By: Moshe Cipro MD, Joycelyn Schmid    . Low vitamin B12 level 07/10/2013   Overview:  Last Assessment & Plan:  Being treated through hematology  . Newborn product of in vitro fertilization (IVF) pregnancy   . Obesity     Past Surgical History:  Procedure Laterality Date  . Bilateral eustachian tube  placement on 4 different occasions    . CARDIAC ELECTROPHYSIOLOGY MAPPING AND ABLATION    . CESAREAN SECTION N/A 05/26/2017   Procedure: CESAREAN SECTION;  Surgeon: Linda Hedges, DO;  Location: [facility];  Service: Obstetrics;  Laterality: N/A;  . CHOLECYSTECTOMY  2003  . COLONOSCOPY N/A 11/29/2013   Procedure: COLONOSCOPY;  Surgeon: Rogene Houston, MD;  [facility];  Service: Endoscopy;  Laterality: N/A;  120-rescheduled to 300 Ann notified pt  . GASTRIC BYPASS  2006  . TONSILLECTOMY  1990    Social History   Socioeconomic History  . Marital status: Married    Spouse name: Not on file  . Number of children: Not on file  . Years of education: Not on file  . Highest education level: Not on file  Occupational History  . Occupation: Hydrographic surveyor: [employer]  Social Needs  . Financial resource strain: Not on file  . Food insecurity:    Worry: Not on file    Inability: Not on file  . Transportation needs:    Medical: Not on file    Non-medical: Not on file  Tobacco Use  . Smoking status: Former Smoker    Packs/day: 0.50    Types: Cigarettes    Start date: 03/21/2013    Last attempt to quit: 06/02/2015    Years since quitting: 2.9  . Smokeless tobacco: Never Used  Substance and Sexual Activity  . Alcohol use: No    Alcohol/week: 0.0 oz  . Drug use: No  . Sexual activity: Yes    Birth control/protection: None    Comment: husband had vasectomy  Lifestyle  . Physical activity:    Days per week: Not on file    Minutes per session: Not on file  . Stress: Not on file  Relationships  . Social connections:    Talks on phone: Not on file    Gets together: Not on file    Attends religious service: Not on file    Active member of club or organization: Not on file    Attends meetings of clubs or organizations: Not on file    Relationship status: Not on file  . Intimate partner violence:    Fear of current or ex partner: Not on file    Emotionally abused:  Not on file    Physically abused: Not on file    Forced sexual activity: Not on file  Other Topics Concern  . Not on file  Social History Narrative  . Not on file     No family history of premature CAD in 1st degree relatives.  Current Meds  Medication Sig  . butalbital-acetaminophen-caffeine (FIORICET, ESGIC) 50-325-40 MG tablet One tablet two times daily as needed for migraine headache Maximum use is two times weekly  . Cyanocobalamin 1000 MCG/ML KIT Inject 1,000 mcg as directed every 30 (thirty) days.  . ferrous sulfate 300 (60 Fe) MG/5ML syrup Take 5 mLs (300 mg total) by mouth 2 (two) times daily with a meal.  Mix in 4oz of orange juice  . levothyroxine (SYNTHROID, LEVOTHROID) 25 MCG tablet Take 25 mcg by mouth daily.  . metoprolol succinate (TOPROL-XL) 25 MG 24 hr tablet TAKE 1 TABLET BY MOUTH DAILY  . nystatin (MYCOSTATIN/NYSTOP) powder Apply topically to rash twice daily  for 1 week , then as needed  . Prenatal Vit-Fe Fumarate-FA (PRENATAL MULTIVITAMIN) TABS tablet Take 1 tablet by mouth 2 (two) times daily.  Marland Kitchen topiramate (TOPAMAX) 50 MG tablet Take 1 tablet (50 mg total) by mouth 2 (two) times daily. One tablet at bedtime      Review of systems complete and found to be negative unless listed above in HPI    Physical exam Blood pressure 111/71, pulse 80, height _0  (1.549 m), weight 201 lb (91.2 kg), SpO2 99 %, unknown if currently breastfeeding. General: NAD Neck: No JVD, no thyromegaly or thyroid nodule.  Lungs: Clear to auscultation bilaterally with normal respiratory effort. CV: Nondisplaced PMI. Regular rate and rhythm with frequent premature contractions appreciated, normal S1/S2, no S3/S4, no murmur.  No peripheral edema.  No carotid bruit.  Abdomen: Soft, nontender, no distention.  Skin: Intact without lesions or rashes.  Neurologic: Alert and oriented x 3.  Psych: Normal affect. Extremities: No clubbing or cyanosis.  HEENT: Normal.   ECG: Most recent ECG  reviewed.   Labs: Lab Results  Component Value Date/Time   K 3.5 03/07/2018 01:53 PM   BUN 11 03/07/2018 01:53 PM   CREATININE 0.78 03/07/2018 01:53 PM   CREATININE 0.83 06/05/2014 10:44 AM   ALT 11 (L) 05/18/2017 01:51 PM   TSH 2.45 05/10/2018 08:05 AM   HGB 12.5 05/06/2018 10:07 AM     Lipids: Lab Results  Component Value Date/Time   LDLCALC 90 05/10/2018 08:05 AM   CHOL 172 05/10/2018 08:05 AM   TRIG 94 05/10/2018 08:05 AM   HDL 63 05/10/2018 08:05 AM        ASSESSMENT AND PLAN:  1.  Preoperative risk stratification: She is planning to undergo bilateral carpal tunnel surgery which is a low risk surgery from a cardiac perspective.  ECG is normal.  She is symptomatically stable overall from a cardiac perspective.  She does not require cardiac testing at this time.  I will increase Toprol-XL to 37.5 mg daily for symptom relief.  2.  History of atrial flutter and radio frequency ablation with ongoing palpitations: Currently on Toprol-XL 25 mg daily.  ECG is normal.  She experiences palpitations once or twice per week and they were appreciated on physical exam.  I will increase Toprol-XL to 37.5 mg daily to see if this helps to alleviate her symptoms.  She requested to her monitor at some point in the future.  I will likely do this in the fall.   Disposition: Follow up in 4 months  Signed: Kate Sable, M.D., F.A.C.C.  05/18/2018, 8:49 AM

## 2018-05-20 DIAGNOSIS — G5603 Carpal tunnel syndrome, bilateral upper limbs: Secondary | ICD-10-CM | POA: Diagnosis not present

## 2018-06-02 DIAGNOSIS — M25632 Stiffness of left wrist, not elsewhere classified: Secondary | ICD-10-CM | POA: Diagnosis not present

## 2018-06-02 DIAGNOSIS — M25631 Stiffness of right wrist, not elsewhere classified: Secondary | ICD-10-CM | POA: Diagnosis not present

## 2018-06-10 DIAGNOSIS — M79642 Pain in left hand: Secondary | ICD-10-CM | POA: Diagnosis not present

## 2018-06-16 ENCOUNTER — Other Ambulatory Visit: Payer: Self-pay

## 2018-06-16 DIAGNOSIS — M25641 Stiffness of right hand, not elsewhere classified: Secondary | ICD-10-CM | POA: Diagnosis not present

## 2018-06-16 DIAGNOSIS — G5602 Carpal tunnel syndrome, left upper limb: Secondary | ICD-10-CM | POA: Diagnosis not present

## 2018-06-16 DIAGNOSIS — G5601 Carpal tunnel syndrome, right upper limb: Secondary | ICD-10-CM | POA: Diagnosis not present

## 2018-06-16 DIAGNOSIS — M25642 Stiffness of left hand, not elsewhere classified: Secondary | ICD-10-CM | POA: Diagnosis not present

## 2018-06-16 MED ORDER — TOPIRAMATE 50 MG PO TABS: ORAL_TABLET | ORAL | 3 refills | Status: DC

## 2018-06-20 DIAGNOSIS — Z3141 Encounter for fertility testing: Secondary | ICD-10-CM | POA: Diagnosis not present

## 2018-07-05 DIAGNOSIS — Z3141 Encounter for fertility testing: Secondary | ICD-10-CM | POA: Diagnosis not present

## 2018-07-07 DIAGNOSIS — Z3200 Encounter for pregnancy test, result unknown: Secondary | ICD-10-CM | POA: Diagnosis not present

## 2018-07-25 DIAGNOSIS — Z3490 Encounter for supervision of normal pregnancy, unspecified, unspecified trimester: Secondary | ICD-10-CM | POA: Diagnosis not present

## 2018-07-25 DIAGNOSIS — Z3200 Encounter for pregnancy test, result unknown: Secondary | ICD-10-CM | POA: Diagnosis not present

## 2018-07-29 DIAGNOSIS — G43719 Chronic migraine without aura, intractable, without status migrainosus: Secondary | ICD-10-CM | POA: Diagnosis not present

## 2018-08-04 DIAGNOSIS — O30041 Twin pregnancy, dichorionic/diamniotic, first trimester: Secondary | ICD-10-CM | POA: Diagnosis not present

## 2018-08-04 DIAGNOSIS — Z3A00 Weeks of gestation of pregnancy not specified: Secondary | ICD-10-CM | POA: Diagnosis not present

## 2018-08-10 DIAGNOSIS — D509 Iron deficiency anemia, unspecified: Secondary | ICD-10-CM | POA: Diagnosis not present

## 2018-08-10 DIAGNOSIS — Z3689 Encounter for other specified antenatal screening: Secondary | ICD-10-CM | POA: Diagnosis not present

## 2018-08-10 DIAGNOSIS — O09811 Supervision of pregnancy resulting from assisted reproductive technology, first trimester: Secondary | ICD-10-CM | POA: Diagnosis not present

## 2018-08-10 DIAGNOSIS — Z3A09 9 weeks gestation of pregnancy: Secondary | ICD-10-CM | POA: Diagnosis not present

## 2018-08-10 LAB — OB RESULTS CONSOLE HIV ANTIBODY (ROUTINE TESTING): HIV: NONREACTIVE

## 2018-08-10 LAB — OB RESULTS CONSOLE ABO/RH: RH Type: POSITIVE

## 2018-08-10 LAB — OB RESULTS CONSOLE HEPATITIS B SURFACE ANTIGEN: Hepatitis B Surface Ag: NEGATIVE

## 2018-08-10 LAB — OB RESULTS CONSOLE RPR: RPR: NONREACTIVE

## 2018-08-10 LAB — OB RESULTS CONSOLE RUBELLA ANTIBODY, IGM: Rubella: IMMUNE

## 2018-08-10 LAB — OB RESULTS CONSOLE GC/CHLAMYDIA
Chlamydia: NEGATIVE
Gonorrhea: NEGATIVE

## 2018-08-10 LAB — OB RESULTS CONSOLE ANTIBODY SCREEN: Antibody Screen: NEGATIVE

## 2018-08-12 ENCOUNTER — Other Ambulatory Visit: Payer: Self-pay | Admitting: Family Medicine

## 2018-08-12 DIAGNOSIS — G43711 Chronic migraine without aura, intractable, with status migrainosus: Secondary | ICD-10-CM | POA: Diagnosis not present

## 2018-08-16 DIAGNOSIS — Z3689 Encounter for other specified antenatal screening: Secondary | ICD-10-CM | POA: Diagnosis not present

## 2018-08-16 DIAGNOSIS — O30041 Twin pregnancy, dichorionic/diamniotic, first trimester: Secondary | ICD-10-CM | POA: Diagnosis not present

## 2018-08-16 DIAGNOSIS — Z3A09 9 weeks gestation of pregnancy: Secondary | ICD-10-CM | POA: Diagnosis not present

## 2018-08-31 DIAGNOSIS — O30041 Twin pregnancy, dichorionic/diamniotic, first trimester: Secondary | ICD-10-CM | POA: Diagnosis not present

## 2018-08-31 DIAGNOSIS — Z3A12 12 weeks gestation of pregnancy: Secondary | ICD-10-CM | POA: Diagnosis not present

## 2018-08-31 DIAGNOSIS — Z3682 Encounter for antenatal screening for nuchal translucency: Secondary | ICD-10-CM | POA: Diagnosis not present

## 2018-09-01 DIAGNOSIS — Z319 Encounter for procreative management, unspecified: Secondary | ICD-10-CM | POA: Diagnosis not present

## 2018-09-07 ENCOUNTER — Inpatient Hospital Stay (HOSPITAL_COMMUNITY): Payer: BLUE CROSS/BLUE SHIELD | Attending: Hematology

## 2018-09-07 DIAGNOSIS — D508 Other iron deficiency anemias: Secondary | ICD-10-CM

## 2018-09-07 DIAGNOSIS — Z23 Encounter for immunization: Secondary | ICD-10-CM | POA: Insufficient documentation

## 2018-09-07 DIAGNOSIS — D519 Vitamin B12 deficiency anemia, unspecified: Secondary | ICD-10-CM | POA: Diagnosis not present

## 2018-09-07 DIAGNOSIS — D509 Iron deficiency anemia, unspecified: Secondary | ICD-10-CM | POA: Insufficient documentation

## 2018-09-07 LAB — CBC WITH DIFFERENTIAL/PLATELET
Abs Immature Granulocytes: 0.08 10*3/uL — ABNORMAL HIGH (ref 0.00–0.07)
Basophils Absolute: 0 10*3/uL (ref 0.0–0.1)
Basophils Relative: 0 %
Eosinophils Absolute: 0.2 10*3/uL (ref 0.0–0.5)
Eosinophils Relative: 3 %
HCT: 35.8 % — ABNORMAL LOW (ref 36.0–46.0)
Hemoglobin: 11.6 g/dL — ABNORMAL LOW (ref 12.0–15.0)
Immature Granulocytes: 1 %
Lymphocytes Relative: 22 %
Lymphs Abs: 2.1 10*3/uL (ref 0.7–4.0)
MCH: 29.7 pg (ref 26.0–34.0)
MCHC: 32.4 g/dL (ref 30.0–36.0)
MCV: 91.8 fL (ref 80.0–100.0)
Monocytes Absolute: 0.5 10*3/uL (ref 0.1–1.0)
Monocytes Relative: 5 %
Neutro Abs: 6.7 10*3/uL (ref 1.7–7.7)
Neutrophils Relative %: 69 %
Platelets: 253 10*3/uL (ref 150–400)
RBC: 3.9 MIL/uL (ref 3.87–5.11)
RDW: 13.2 % (ref 11.5–15.5)
WBC: 9.6 10*3/uL (ref 4.0–10.5)
nRBC: 0 % (ref 0.0–0.2)

## 2018-09-07 LAB — COMPREHENSIVE METABOLIC PANEL
ALT: 15 U/L (ref 0–44)
AST: 14 U/L — ABNORMAL LOW (ref 15–41)
Albumin: 2.9 g/dL — ABNORMAL LOW (ref 3.5–5.0)
Alkaline Phosphatase: 99 U/L (ref 38–126)
Anion gap: 6 (ref 5–15)
BUN: 5 mg/dL — ABNORMAL LOW (ref 6–20)
CO2: 22 mmol/L (ref 22–32)
Calcium: 8.4 mg/dL — ABNORMAL LOW (ref 8.9–10.3)
Chloride: 109 mmol/L (ref 98–111)
Creatinine, Ser: 0.58 mg/dL (ref 0.44–1.00)
GFR calc Af Amer: >60 mL/min (ref >60)
GFR calc non Af Amer: >60 mL/min (ref >60)
Glucose, Bld: 102 mg/dL — ABNORMAL HIGH (ref 70–99)
Potassium: 3.7 mmol/L (ref 3.5–5.1)
Sodium: 137 mmol/L (ref 135–145)
Total Bilirubin: 0.6 mg/dL (ref 0.3–1.2)
Total Protein: 6 g/dL — ABNORMAL LOW (ref 6.5–8.1)

## 2018-09-07 LAB — FERRITIN: Ferritin: 191 ng/mL (ref 11–307)

## 2018-09-07 LAB — LACTATE DEHYDROGENASE: LDH: 123 U/L (ref 98–192)

## 2018-09-13 DIAGNOSIS — Z3A13 13 weeks gestation of pregnancy: Secondary | ICD-10-CM | POA: Diagnosis not present

## 2018-09-13 DIAGNOSIS — O30041 Twin pregnancy, dichorionic/diamniotic, first trimester: Secondary | ICD-10-CM | POA: Diagnosis not present

## 2018-09-14 ENCOUNTER — Ambulatory Visit (HOSPITAL_COMMUNITY): Payer: BLUE CROSS/BLUE SHIELD | Admitting: Hematology

## 2018-09-15 ENCOUNTER — Other Ambulatory Visit: Payer: Self-pay

## 2018-09-15 ENCOUNTER — Inpatient Hospital Stay (HOSPITAL_BASED_OUTPATIENT_CLINIC_OR_DEPARTMENT_OTHER): Payer: BLUE CROSS/BLUE SHIELD | Admitting: Hematology

## 2018-09-15 ENCOUNTER — Encounter (HOSPITAL_COMMUNITY): Payer: Self-pay | Admitting: Hematology

## 2018-09-15 VITALS — BP 115/48 | HR 99 | Temp 98.3°F | Resp 16 | Wt 223.0 lb

## 2018-09-15 DIAGNOSIS — D519 Vitamin B12 deficiency anemia, unspecified: Secondary | ICD-10-CM | POA: Diagnosis not present

## 2018-09-15 DIAGNOSIS — D509 Iron deficiency anemia, unspecified: Secondary | ICD-10-CM | POA: Diagnosis not present

## 2018-09-15 DIAGNOSIS — Z23 Encounter for immunization: Secondary | ICD-10-CM | POA: Diagnosis not present

## 2018-09-15 DIAGNOSIS — D508 Other iron deficiency anemias: Secondary | ICD-10-CM

## 2018-09-15 DIAGNOSIS — E538 Deficiency of other specified B group vitamins: Secondary | ICD-10-CM

## 2018-09-15 DIAGNOSIS — R7989 Other specified abnormal findings of blood chemistry: Secondary | ICD-10-CM

## 2018-09-15 MED ORDER — FERROUS SULFATE 300 (60 FE) MG/5ML PO SYRP: 300.0000 mg | ORAL_SOLUTION | Freq: Two times a day (BID) | ORAL | 3 refills | Status: DC

## 2018-09-15 MED ORDER — INFLUENZA VAC SPLIT QUAD 0.5 ML IM SUSY
0.5000 mL | PREFILLED_SYRINGE | Freq: Once | INTRAMUSCULAR | Status: AC
  Administered 2018-09-15: 0.5 mL via INTRAMUSCULAR
  Filled 2018-09-15: qty 0.5

## 2018-09-15 NOTE — Progress Notes (Signed)
Pt given flu shot today. Pt tolerated injection well with no complaints. Pt stable and discharged home ambulatory.

## 2018-09-15 NOTE — Progress Notes (Signed)
Alejandra Barr, [name]   CLINIC:  Medical Oncology/Hematology  PCP:  Alejandra Helper, Barr [address] [place] [address] [phone]   REASON FOR VISIT:  Follow-up for iron deficiency and B12 deficiency anemia.  CURRENT THERAPY: Intermittent Feraheme infusions and continuous B12 injections.   INTERVAL HISTORY:  [name] 32 y.o. female returns for follow-up of her iron and B12 deficiencies.  She had gastric bypass in 2006 resulting in the above-mentioned deficiencies.  She is reportedly 14-week pregnant at this time.  Her EDD is 03/14/2018.  She is currently receiving B12 monthly injections.  She denies any bleeding per rectum or melena.  She does not feel excessively tired at this time.  Last Injectafer injection was in May.  Energy levels are 50% and appetite is 75%.   REVIEW OF SYSTEMS:  Review of Systems  Constitutional: Positive for fatigue.  Gastrointestinal: Positive for nausea.     PAST MEDICAL/SURGICAL HISTORY:  Past Medical History:  Diagnosis Date  . Asthma   . Atrial flutter ([place])    Status post RFA by Dr. Lovena Le in 2004  . Chronic anxiety   . Chronic depression   . 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 Iron deficiency   . Iron deficiency anemia 10/02/2010   Qualifier: Diagnosis of  By: Alejandra Barr, Alejandra Barr    . Low vitamin B12 level 07/10/2013   Overview:  Last Assessment & Plan:  Being treated through hematology  . Newborn product of in vitro fertilization (IVF) pregnancy   . Obesity    Past Surgical History:  Procedure Laterality Date  . Bilateral eustachian tube placement on 4 different occasions    . CARDIAC ELECTROPHYSIOLOGY MAPPING AND ABLATION    . CESAREAN SECTION N/A 05/26/2017   Procedure: CESAREAN SECTION;  Surgeon: Alejandra Hedges, DO;  Location: [facility];  Service: Obstetrics;  Laterality: N/A;  . CHOLECYSTECTOMY  2003  . COLONOSCOPY N/A 11/29/2013   Procedure:  COLONOSCOPY;  Surgeon: Alejandra Houston, Barr;  [facility];  Service: Endoscopy;  Laterality: N/A;  120-rescheduled to 300 Ann notified pt  . GASTRIC BYPASS  2006  . TONSILLECTOMY  1990     SOCIAL HISTORY:  Social History   Socioeconomic History  . Marital status: Married    Spouse name: Not on file  . Number of children: Not on file  . Years of education: Not on file  . Highest education level: Not on file  Occupational History  . Occupation: Hydrographic surveyor: [employer]  Social Needs  . Financial resource strain: Not on file  . Food insecurity:    Worry: Not on file    Inability: Not on file  . Transportation needs:    Medical: Not on file    Non-medical: Not on file  Tobacco Use  . Smoking status: Former Smoker    Packs/day: 0.50    Types: Cigarettes    Start date: 03/21/2013    Last attempt to quit: 06/02/2015    Years since quitting: 3.2  . Smokeless tobacco: Never Used  Substance and Sexual Activity  . Alcohol use: No    Alcohol/week: 0.0 standard drinks  . Drug use: No  . Sexual activity: Yes    Birth control/protection: None    Comment: husband had vasectomy  Lifestyle  . Physical activity:    Days per week: Not on file    Minutes per session: Not  on file  . Stress: Not on file  Relationships  . Social connections:    Talks on phone: Not on file    Gets together: Not on file    Attends religious service: Not on file    Active member of club or organization: Not on file    Attends meetings of clubs or organizations: Not on file    Relationship status: Not on file  . Intimate partner violence:    Fear of current or ex partner: Not on file    Emotionally abused: Not on file    Physically abused: Not on file    Forced sexual activity: Not on file  Other Topics Concern  . Not on file  Social History Narrative  . Not on file    FAMILY HISTORY:  Family History  Problem Relation Age of Onset  . Fibromyalgia Mother        Chronic pelvic pain     . Hypothyroidism Mother   . Obesity Mother   . Heart defect Mother        Supraventricular, tachycardia /tachypalpation   . Arrhythmia Mother   . Breast cancer Mother 72  . Hypertension Maternal Grandmother   . Diabetes Maternal Grandmother   . Hypertension Maternal Grandfather   . Heart disease Paternal Grandmother   . Hypertension Paternal Grandmother   . Heart disease Paternal Grandfather   . Hypertension Paternal Grandfather     CURRENT MEDICATIONS:  Outpatient Encounter Medications as of 09/15/2018  Medication Sig  . butalbital-acetaminophen-caffeine (FIORICET, ESGIC) 50-325-40 MG tablet TK 1 TO 2 TS PO Q 6 H FOR HA. MAX OF 4 PER DAY  . Cyanocobalamin 1000 MCG/ML KIT Inject 1,000 mcg as directed every 30 (thirty) days.  Marland Kitchen estradiol (VIVELLE-DOT) 0.1 MG/24HR patch PLACE 1 PA ON ABDOMEN OR UPPER BUTTOCKS AND CHANGE Q 3 DAYS. GEF VIVELLE-DOT.  . ferrous sulfate 300 (60 Fe) MG/5ML syrup Take 5 mLs (300 mg total) by mouth 2 (two) times daily with a meal. Mix in 4oz of orange juice  . levothyroxine (SYNTHROID, LEVOTHROID) 25 MCG tablet Take 25 mcg by mouth daily. Pt taking Mon, Wed, Fri and Sun  . levothyroxine (SYNTHROID, LEVOTHROID) 50 MCG tablet Take 50 mcg by mouth daily before breakfast. Pt taking Tues and Sat  . metoprolol succinate (TOPROL-XL) 25 MG 24 hr tablet Take 1.5 tablets (37.5 mg total) by mouth daily.  Marland Kitchen nystatin (MYCOSTATIN/NYSTOP) powder Apply topically to rash twice daily  for 1 week , then as needed  . Prenatal Vit-Fe Fumarate-FA (PRENATAL MULTIVITAMIN) TABS tablet Take 1 tablet by mouth 2 (two) times daily.  Marland Kitchen topiramate (TOPAMAX) 50 MG tablet One tablet at bedtime  . [DISCONTINUED] ferrous sulfate 300 (60 Fe) MG/5ML syrup Take 5 mLs (300 mg total) by mouth 2 (two) times daily with a meal. Mix in 4oz of orange juice  . [EXPIRED] Influenza vac split quadrivalent PF (FLUARIX) injection 0.5 mL    No facility-administered encounter medications on file as of  09/15/2018.     ALLERGIES:  Allergies  Allergen Reactions  . Ciprofloxacin Anaphylaxis  . Morphine And Related Anaphylaxis  . Oseltamivir Hives  . Penicillins Anaphylaxis    Has patient had a PCN reaction causing immediate rash, facial/tongue/throat swelling, SOB or lightheadedness with hypotension: No Has patient had a PCN reaction causing severe rash involving mucus membranes or skin necrosis: No Has patient had a PCN reaction that required hospitalization: No Has patient had a PCN reaction occurring within the last 10  years: No If all of the above answers are "NO", then may proceed with Cephalosporin use.     PHYSICAL EXAM:  ECOG Performance status: 1  Vitals:   09/15/18 1148  BP: (!) 115/48  Pulse: 99  Resp: 16  Temp: 98.3 F (36.8 C)  SpO2: 100%   Filed Weights   09/15/18 1148  Weight: 223 lb (101.2 kg)    Physical Exam  Constitutional: She appears well-developed and well-nourished.  Cardiovascular: Regular rhythm.  Pulmonary/Chest: Breath sounds normal.  Musculoskeletal: She exhibits edema.  Psychiatric: She has a normal mood and affect. Thought content normal.     LABORATORY DATA:  I have reviewed the labs as listed.  CBC    Component Value Date/Time   WBC 9.6 09/07/2018 1259   RBC 3.90 09/07/2018 1259   HGB 11.6 (L) 09/07/2018 1259   HCT 35.8 (L) 09/07/2018 1259   PLT 253 09/07/2018 1259   MCV 91.8 09/07/2018 1259   MCH 29.7 09/07/2018 1259   MCHC 32.4 09/07/2018 1259   RDW 13.2 09/07/2018 1259   LYMPHSABS 2.1 09/07/2018 1259   MONOABS 0.5 09/07/2018 1259   EOSABS 0.2 09/07/2018 1259   BASOSABS 0.0 09/07/2018 1259   CMP Latest Ref Rng & Units 09/07/2018 03/07/2018 10/29/2017  Glucose 70 - 99 mg/dL 102(H) 90 101(H)  BUN 6 - 20 mg/dL 5(L) 11 22(H)  Creatinine 0.44 - 1.00 mg/dL 0.58 0.78 0.93  Sodium 135 - 145 mmol/L 137 140 137  Potassium 3.5 - 5.1 mmol/L 3.7 3.5 4.4  Chloride 98 - 111 mmol/L 109 102 102  CO2 22 - 32 mmol/L 22 29 25   Calcium  8.9 - 10.3 mg/dL 8.4(L) 8.7(L) 9.0  Total Protein 6.5 - 8.1 g/dL 6.0(L) - -  Total Bilirubin 0.3 - 1.2 mg/dL 0.6 - -  Alkaline Phos 38 - 126 U/L 99 - -  AST 15 - 41 U/L 14(L) - -  ALT 0 - 44 U/L 15 - -         ASSESSMENT & PLAN:   Iron deficiency anemia 1.  Iron deficiency anemia: -Due to malabsorption from gastric bypass surgery in 2006 and blood loss from menstruation. -Last treated with Injectafer on 03/18/2018 and 03/31/2018.  Patient felt better after that. - Patient is currently [redacted] weeks pregnant and carrying twins.  EDD is on 03/14/2018. - She had subchorionic hematoma and had slight bleeding from weeks 6 through week 10 during the current pregnancy. - We discussed the results today.  Her ferritin is 191.  She does not require any parenteral iron at this time.  If she does, we would prefer to use Venofer which is known to be safer during pregnancy. - We will check her blood in 8 weeks as more frequent monitoring is necessary during pregnancy.  2.  B12 deficiency: - She is currently receiving B12 once a month injection at home. - I have asked her to switch it to every 3 weeks as her B12 was low at 154 in May.  We will repeat another level in 8 weeks.  3.  Health maintenance: -She will receive flu vaccine today.      Orders placed this encounter:  Orders Placed This Encounter  Procedures  . CBC with Differential  . Comprehensive metabolic panel  . Ferritin  . Iron and TIBC  . Vitamin B12  . Folate      Derek Jack, Barr [place] [phone]

## 2018-09-15 NOTE — Assessment & Plan Note (Addendum)
1.  Iron deficiency anemia: -Due to malabsorption from gastric bypass surgery in 2006 and blood loss from menstruation. -Last treated with Injectafer on 03/18/2018 and 03/31/2018.  Patient felt better after that. - Patient is currently [redacted] weeks pregnant and carrying twins.  EDD is on 03/14/2018. - She had subchorionic hematoma and had slight bleeding from weeks 6 through week 10 during the current pregnancy. - We discussed the results today.  Her ferritin is 191.  She does not require any parenteral iron at this time.  If she does, we would prefer to use Venofer which is known to be safer during pregnancy. - We will check her blood in 8 weeks as more frequent monitoring is necessary during pregnancy.  2.  B12 deficiency: - She is currently receiving B12 once a month injection at home. - I have asked her to switch it to every 3 weeks as her B12 was low at 154 in May.  We will repeat another level in 8 weeks.  3.  Health maintenance: -She will receive flu vaccine today.

## 2018-09-20 ENCOUNTER — Ambulatory Visit: Payer: BLUE CROSS/BLUE SHIELD | Admitting: Cardiovascular Disease

## 2018-09-27 DIAGNOSIS — O30042 Twin pregnancy, dichorionic/diamniotic, second trimester: Secondary | ICD-10-CM | POA: Diagnosis not present

## 2018-09-27 DIAGNOSIS — Z3A15 15 weeks gestation of pregnancy: Secondary | ICD-10-CM | POA: Diagnosis not present

## 2018-09-27 DIAGNOSIS — Z361 Encounter for antenatal screening for raised alphafetoprotein level: Secondary | ICD-10-CM | POA: Diagnosis not present

## 2018-09-27 DIAGNOSIS — Z130 Encounter for screening for diseases of the blood and blood-forming organs and certain disorders involving the immune mechanism: Secondary | ICD-10-CM | POA: Diagnosis not present

## 2018-10-04 ENCOUNTER — Telehealth: Payer: Self-pay | Admitting: *Deleted

## 2018-10-04 ENCOUNTER — Encounter: Payer: Self-pay | Admitting: Cardiovascular Disease

## 2018-10-04 ENCOUNTER — Ambulatory Visit (INDEPENDENT_AMBULATORY_CARE_PROVIDER_SITE_OTHER): Payer: BLUE CROSS/BLUE SHIELD | Admitting: Cardiovascular Disease

## 2018-10-04 ENCOUNTER — Other Ambulatory Visit (INDEPENDENT_AMBULATORY_CARE_PROVIDER_SITE_OTHER): Payer: BLUE CROSS/BLUE SHIELD

## 2018-10-04 VITALS — BP 102/74 | HR 91 | Ht 61.0 in | Wt 227.0 lb

## 2018-10-04 DIAGNOSIS — Z8679 Personal history of other diseases of the circulatory system: Secondary | ICD-10-CM | POA: Diagnosis not present

## 2018-10-04 DIAGNOSIS — Z9889 Other specified postprocedural states: Secondary | ICD-10-CM

## 2018-10-04 DIAGNOSIS — R002 Palpitations: Secondary | ICD-10-CM

## 2018-10-04 NOTE — Progress Notes (Signed)
SUBJECTIVE: The patient presents for routine follow-up.  She is [redacted] weeks pregnant with twins and her obstetrician requested that the patient be evaluated today.  She has a previous history of atrial flutter and radiofrequency ablation.  She underwent radiofrequency ablation of atrial flutter by Dr. Lovena Le in 2004.  Additional past medical history includes iron deficiency anemia, migraines, and morbid obesity.  She was experiencing palpitations at her initial visit with me on 05/18/2018 and I increased Toprol-XL to 37.5 mg daily.  We had a discussion about having her wear an event monitor after carpal tunnel surgery.  ECG performed in the office today which I ordered and personally interpreted demonstrates sinus tachycardia, 102 bpm.  I reviewed notes from her obstetrician.  She is now on levothyroxine.  She continues to experience episodic palpitations occurring at least twice per week.  She denies chest pain, dizziness, and near syncope.    Review of Systems: As per "subjective", otherwise negative.  Allergies  Allergen Reactions  . Ciprofloxacin Anaphylaxis  . Morphine And Related Anaphylaxis  . Oseltamivir Hives  . Penicillins Anaphylaxis    Has patient had a PCN reaction causing immediate rash, facial/tongue/throat swelling, SOB or lightheadedness with hypotension: No Has patient had a PCN reaction causing severe rash involving mucus membranes or skin necrosis: No Has patient had a PCN reaction that required hospitalization: No Has patient had a PCN reaction occurring within the last 10 years: No If all of the above answers are "NO", then may proceed with Cephalosporin use.    Current Outpatient Medications  Medication Sig Dispense Refill  . Cyanocobalamin 1000 MCG/ML KIT Inject 1,000 mcg as directed every 21 ( twenty-one) days.     . ferrous sulfate 300 (60 Fe) MG/5ML syrup Take 5 mLs (300 mg total) by mouth 2 (two) times daily with a meal. Mix in 4oz of orange juice  150 mL 3  . levothyroxine (SYNTHROID, LEVOTHROID) 25 MCG tablet Take 25 mcg by mouth daily. Pt taking Mon, Wed, Fri and Sun    . levothyroxine (SYNTHROID, LEVOTHROID) 50 MCG tablet Take 50 mcg by mouth daily before breakfast. Pt taking Tues and Sat  2  . metoprolol succinate (TOPROL-XL) 25 MG 24 hr tablet Take 1.5 tablets (37.5 mg total) by mouth daily. 135 tablet 1  . nystatin (MYCOSTATIN/NYSTOP) powder Apply topically to rash twice daily  for 1 week , then as needed 60 g 1  . Prenatal Vit-Fe Fumarate-FA (PRENATAL MULTIVITAMIN) TABS tablet Take 1 tablet by mouth 2 (two) times daily.     No current facility-administered medications for this visit.     Past Medical History:  Diagnosis Date  . Asthma   . Atrial flutter ([place])    Status post RFA by Dr. Lovena Le in 2004  . Chronic anxiety   . Chronic depression   . 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 Iron deficiency   . Iron deficiency anemia 10/02/2010   Qualifier: Diagnosis of  By: Moshe Cipro MD, Joycelyn Schmid    . Low vitamin B12 level 07/10/2013   Overview:  Last Assessment & Plan:  Being treated through hematology  . Newborn product of in vitro fertilization (IVF) pregnancy   . Obesity     Past Surgical History:  Procedure Laterality Date  . Bilateral eustachian tube placement on 4 different occasions    . CARDIAC ELECTROPHYSIOLOGY MAPPING AND ABLATION    . CESAREAN SECTION N/A 05/26/2017   Procedure: CESAREAN SECTION;  Surgeon: Linda Hedges, DO;  Location: [facility];  Service: Obstetrics;  Laterality: N/A;  . CHOLECYSTECTOMY  2003  . COLONOSCOPY N/A 11/29/2013   Procedure: COLONOSCOPY;  Surgeon: Rogene Houston, MD;  [facility];  Service: Endoscopy;  Laterality: N/A;  120-rescheduled to 300 Ann notified pt  . GASTRIC BYPASS  2006  . TONSILLECTOMY  1990    Social History   Socioeconomic History  . Marital status: Married    Spouse name: Not on file  . Number of children: Not on file  . Years of education: Not on  file  . Highest education level: Not on file  Occupational History  . Occupation: Hydrographic surveyor: [employer]  Social Needs  . Financial resource strain: Not on file  . Food insecurity:    Worry: Not on file    Inability: Not on file  . Transportation needs:    Medical: Not on file    Non-medical: Not on file  Tobacco Use  . Smoking status: Former Smoker    Packs/day: 0.50    Types: Cigarettes    Start date: 03/21/2013    Last attempt to quit: 06/02/2015    Years since quitting: 3.3  . Smokeless tobacco: Never Used  Substance and Sexual Activity  . Alcohol use: No    Alcohol/week: 0.0 standard drinks  . Drug use: No  . Sexual activity: Yes    Birth control/protection: None    Comment: husband had vasectomy  Lifestyle  . Physical activity:    Days per week: Not on file    Minutes per session: Not on file  . Stress: Not on file  Relationships  . Social connections:    Talks on phone: Not on file    Gets together: Not on file    Attends religious service: Not on file    Active member of club or organization: Not on file    Attends meetings of clubs or organizations: Not on file    Relationship status: Not on file  . Intimate partner violence:    Fear of current or ex partner: Not on file    Emotionally abused: Not on file    Physically abused: Not on file    Forced sexual activity: Not on file  Other Topics Concern  . Not on file  Social History Narrative  . Not on file     Vitals:   10/04/18 1556  BP: 102/74  Pulse: 91  SpO2: 99%  Weight: 227 lb (103 kg)  Height: '5\' 1"'$  (1.549 m)    Wt Readings from Last 3 Encounters:  10/04/18 227 lb (103 kg)  09/15/18 223 lb (101.2 kg)  05/18/18 201 lb (91.2 kg)     PHYSICAL EXAM General: NAD HEENT: Normal. Neck: No JVD, no thyromegaly. Lungs: Clear to auscultation bilaterally with normal respiratory effort. CV: Heart rate at upper normal limits, regular rhythm, normal S1/S2, no S3/S4, no murmur. No pretibial or  periankle edema.  No carotid bruit.   Abdomen: Soft, gravid.  Neurologic: Alert and oriented.  Psych: Normal affect. Skin: Normal. Musculoskeletal: No gross deformities.    ECG: Reviewed above under Subjective   Labs: Lab Results  Component Value Date/Time   K 3.7 09/07/2018 12:59 PM   BUN 5 (L) 09/07/2018 12:59 PM   CREATININE 0.58 09/07/2018 12:59 PM   CREATININE 0.83 06/05/2014 10:44 AM   ALT 15 09/07/2018 12:59 PM   TSH 2.45 05/10/2018 08:05 AM   HGB 11.6 (L) 09/07/2018 12:59 PM  Lipids: Lab Results  Component Value Date/Time   LDLCALC 90 05/10/2018 08:05 AM   CHOL 172 05/10/2018 08:05 AM   TRIG 94 05/10/2018 08:05 AM   HDL 63 05/10/2018 08:05 AM       ASSESSMENT AND PLAN:  1.  Palpitations: ECG shows mild sinus tachycardia today.  History of atrial flutter and radiofrequency ablation as noted above.  Currently on Toprol-XL 37.5 mg daily.  I will arrange for a 2-week event monitor.   Disposition: Follow up 2 months   Kate Sable, M.D., F.A.C.C.

## 2018-10-04 NOTE — Telephone Encounter (Signed)
Pt was scheduled originally scheduled for 11/18 however provider was out of office and appt was cx

## 2018-10-04 NOTE — Patient Instructions (Signed)
Medication Instructions:   Your physician recommends that you continue on your current medications as directed. Please refer to the Current Medication list given to you today.  Labwork:  NONE  Testing/Procedures: Your physician has recommended that you wear an event monitor for 2 weeks. Event monitors are medical devices that record the heart's electrical activity. Doctors most often [place] these monitors to diagnose arrhythmias. Arrhythmias are problems with the speed or rhythm of the heartbeat. The monitor is a small, portable device. You can wear one while you do your normal daily activities. This is usually used to diagnose what is causing palpitations/syncope (passing out).  Follow-Up:  Your physician recommends that you schedule a follow-up appointment in: 2 months with an extender at the [facility] office.  Any Other Special Instructions Will Be Listed Below (If Applicable).  If you need a refill on your cardiac medications before your next appointment, please call your pharmacy.

## 2018-10-04 NOTE — Telephone Encounter (Signed)
Dr Edwyna Ready [facility]) says pt is [redacted] weeks pregnant with twins and in [place] wants pt to see Dr Bronson Ing ASAP however we have no appts with either office until 11/09/18 - Dr Edwyna Ready is sending notes and wanted to see if pt could be worked in in the next few days

## 2018-10-04 NOTE — Telephone Encounter (Signed)
Any APP availability at any office?

## 2018-10-04 NOTE — Addendum Note (Signed)
Addended by: Merlene Laughter on: 10/04/2018 04:29 PM   Modules accepted: Orders

## 2018-10-04 NOTE — Telephone Encounter (Signed)
Pt will come today at 55 (opening in the [place] office) to see Dr Bronson Ing

## 2018-10-12 DIAGNOSIS — O30042 Twin pregnancy, dichorionic/diamniotic, second trimester: Secondary | ICD-10-CM | POA: Diagnosis not present

## 2018-10-12 DIAGNOSIS — Z3A18 18 weeks gestation of pregnancy: Secondary | ICD-10-CM | POA: Diagnosis not present

## 2018-10-12 DIAGNOSIS — Z363 Encounter for antenatal screening for malformations: Secondary | ICD-10-CM | POA: Diagnosis not present

## 2018-10-12 DIAGNOSIS — Z3686 Encounter for antenatal screening for cervical length: Secondary | ICD-10-CM | POA: Diagnosis not present

## 2018-10-27 DIAGNOSIS — R002 Palpitations: Secondary | ICD-10-CM | POA: Diagnosis not present

## 2018-10-28 DIAGNOSIS — O30042 Twin pregnancy, dichorionic/diamniotic, second trimester: Secondary | ICD-10-CM | POA: Diagnosis not present

## 2018-10-28 DIAGNOSIS — Z3A20 20 weeks gestation of pregnancy: Secondary | ICD-10-CM | POA: Diagnosis not present

## 2018-11-04 ENCOUNTER — Inpatient Hospital Stay (HOSPITAL_COMMUNITY): Payer: BLUE CROSS/BLUE SHIELD | Attending: Hematology

## 2018-11-04 DIAGNOSIS — E538 Deficiency of other specified B group vitamins: Secondary | ICD-10-CM

## 2018-11-04 DIAGNOSIS — D508 Other iron deficiency anemias: Secondary | ICD-10-CM

## 2018-11-04 DIAGNOSIS — D509 Iron deficiency anemia, unspecified: Secondary | ICD-10-CM | POA: Insufficient documentation

## 2018-11-04 LAB — IRON AND TIBC
Iron: 76 ug/dL (ref 28–170)
Saturation Ratios: 20 % (ref 10.4–31.8)
TIBC: 377 ug/dL (ref 250–450)
UIBC: 301 ug/dL

## 2018-11-04 LAB — COMPREHENSIVE METABOLIC PANEL
ALT: 40 U/L (ref 0–44)
AST: 69 U/L — ABNORMAL HIGH (ref 15–41)
Albumin: 2.7 g/dL — ABNORMAL LOW (ref 3.5–5.0)
Alkaline Phosphatase: 67 U/L (ref 38–126)
Anion gap: 11 (ref 5–15)
BUN: 9 mg/dL (ref 6–20)
CO2: 17 mmol/L — ABNORMAL LOW (ref 22–32)
Calcium: 8.6 mg/dL — ABNORMAL LOW (ref 8.9–10.3)
Chloride: 108 mmol/L (ref 98–111)
Creatinine, Ser: 0.58 mg/dL (ref 0.44–1.00)
GFR calc Af Amer: >60 mL/min (ref >60)
GFR calc non Af Amer: >60 mL/min (ref >60)
Glucose, Bld: 92 mg/dL (ref 70–99)
Potassium: 3 mmol/L — ABNORMAL LOW (ref 3.5–5.1)
Sodium: 136 mmol/L (ref 135–145)
Total Bilirubin: 0.3 mg/dL (ref 0.3–1.2)
Total Protein: 6.1 g/dL — ABNORMAL LOW (ref 6.5–8.1)

## 2018-11-04 LAB — CBC WITH DIFFERENTIAL/PLATELET
Abs Immature Granulocytes: 0.34 10*3/uL — ABNORMAL HIGH (ref 0.00–0.07)
Basophils Absolute: 0.1 10*3/uL (ref 0.0–0.1)
Basophils Relative: 1 %
Eosinophils Absolute: 0.2 10*3/uL (ref 0.0–0.5)
Eosinophils Relative: 2 %
HCT: 37.5 % (ref 36.0–46.0)
Hemoglobin: 12.4 g/dL (ref 12.0–15.0)
Immature Granulocytes: 5 %
Lymphocytes Relative: 15 %
Lymphs Abs: 1.1 10*3/uL (ref 0.7–4.0)
MCH: 29.6 pg (ref 26.0–34.0)
MCHC: 33.1 g/dL (ref 30.0–36.0)
MCV: 89.5 fL (ref 80.0–100.0)
Monocytes Absolute: 0.6 10*3/uL (ref 0.1–1.0)
Monocytes Relative: 8 %
Neutro Abs: 5.2 10*3/uL (ref 1.7–7.7)
Neutrophils Relative %: 69 %
Platelets: 213 10*3/uL (ref 150–400)
RBC: 4.19 MIL/uL (ref 3.87–5.11)
RDW: 13.6 % (ref 11.5–15.5)
WBC: 7.5 10*3/uL (ref 4.0–10.5)
nRBC: 0 % (ref 0.0–0.2)

## 2018-11-04 LAB — VITAMIN B12: Vitamin B-12: 550 pg/mL (ref 180–914)

## 2018-11-04 LAB — FOLATE: Folate: 15.4 ng/mL (ref >5.9)

## 2018-11-04 LAB — FERRITIN: Ferritin: 281 ng/mL (ref 11–307)

## 2018-11-08 DIAGNOSIS — G43719 Chronic migraine without aura, intractable, without status migrainosus: Secondary | ICD-10-CM | POA: Diagnosis not present

## 2018-11-09 ENCOUNTER — Telehealth: Payer: Self-pay | Admitting: *Deleted

## 2018-11-09 ENCOUNTER — Other Ambulatory Visit (HOSPITAL_COMMUNITY): Payer: BLUE CROSS/BLUE SHIELD

## 2018-11-09 DIAGNOSIS — Z3A22 22 weeks gestation of pregnancy: Secondary | ICD-10-CM | POA: Diagnosis not present

## 2018-11-09 DIAGNOSIS — O30042 Twin pregnancy, dichorionic/diamniotic, second trimester: Secondary | ICD-10-CM | POA: Diagnosis not present

## 2018-11-09 DIAGNOSIS — O3432 Maternal care for cervical incompetence, second trimester: Secondary | ICD-10-CM | POA: Diagnosis not present

## 2018-11-09 NOTE — Telephone Encounter (Signed)
Notes recorded by Laurine Blazer, LPN on 03/09/5928 at 2:44 PM EST Patient notified. Copy to pmd. Follow up scheduled for 12/2018 with Dr. Bronson Ing in [place] office. ------  Notes recorded by Herminio Commons, MD on 11/09/2018 at 4:27 PM EST Sinus rhythm and sinus tachycardia seen. Isolated PACs and PVCs. No sustained arrhythmias.

## 2018-11-10 ENCOUNTER — Encounter (HOSPITAL_COMMUNITY): Payer: Self-pay | Admitting: Hematology

## 2018-11-10 ENCOUNTER — Other Ambulatory Visit: Payer: Self-pay

## 2018-11-10 ENCOUNTER — Inpatient Hospital Stay (HOSPITAL_BASED_OUTPATIENT_CLINIC_OR_DEPARTMENT_OTHER): Payer: BLUE CROSS/BLUE SHIELD | Admitting: Hematology

## 2018-11-10 VITALS — BP 110/66 | HR 101 | Temp 98.3°F | Resp 20 | Wt 231.5 lb

## 2018-11-10 DIAGNOSIS — D508 Other iron deficiency anemias: Secondary | ICD-10-CM

## 2018-11-10 DIAGNOSIS — R7989 Other specified abnormal findings of blood chemistry: Secondary | ICD-10-CM

## 2018-11-10 DIAGNOSIS — E538 Deficiency of other specified B group vitamins: Secondary | ICD-10-CM | POA: Diagnosis not present

## 2018-11-10 DIAGNOSIS — E876 Hypokalemia: Secondary | ICD-10-CM

## 2018-11-10 DIAGNOSIS — D509 Iron deficiency anemia, unspecified: Secondary | ICD-10-CM | POA: Diagnosis not present

## 2018-11-10 MED ORDER — POTASSIUM CHLORIDE CRYS ER 20 MEQ PO TBCR
EXTENDED_RELEASE_TABLET | ORAL | Status: AC
  Filled 2018-11-10: qty 2

## 2018-11-10 MED ORDER — POTASSIUM CHLORIDE CRYS ER 20 MEQ PO TBCR
40.0000 meq | EXTENDED_RELEASE_TABLET | Freq: Once | ORAL | Status: AC
  Administered 2018-11-10: 40 meq via ORAL

## 2018-11-10 NOTE — Progress Notes (Signed)
[place], [place] [address]   CLINIC:  Medical Oncology/Hematology  PCP:  Fayrene Helper, MD [address], [place] [address] [phone]   REASON FOR VISIT: Follow-up for iron deficiency and B12 deficiency anemia.  CURRENT THERAPY: Intermittent Feraheme infusions and continuous B12 injections.   INTERVAL HISTORY:  Alejandra Barr 33 y.o. female returns for routine follow-up for iron deficiency anemia and B12 deficiency. She is on oral liquid iron BID however she only takes it once a day. She is being followed closely by OBGYN for her pregnancy. Denies any nausea, vomiting, or diarrhea. Denies any new pains. Had not noticed any recent bleeding such as epistaxis, hematuria or hematochezia. Denies recent chest pain on exertion, shortness of breath on minimal exertion, pre-syncopal episodes, or palpitations. Denies any numbness or tingling in hands or feet. Denies any recent fevers, infections, or recent hospitalizations. She reports her appetite at 100% and energy level is at 25%.    REVIEW OF SYSTEMS:  Review of Systems  Respiratory: Positive for cough.   All other systems reviewed and are negative.    PAST MEDICAL/SURGICAL HISTORY:  Past Medical History:  Diagnosis Date  . Asthma   . Atrial flutter ([place])    Status post RFA by Dr. Lovena Le in 2004  . Chronic anxiety   . Chronic depression   . 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 Iron deficiency   . Iron deficiency anemia 10/02/2010   Qualifier: Diagnosis of  By: Moshe Cipro MD, Joycelyn Schmid    . Low vitamin B12 level 07/10/2013   Overview:  Last Assessment & Plan:  Being treated through hematology  . Newborn product of in vitro fertilization (IVF) pregnancy   . Obesity    Past Surgical History:  Procedure Laterality Date  . Bilateral eustachian tube placement on 4 different occasions    . CARDIAC ELECTROPHYSIOLOGY MAPPING AND ABLATION    . CESAREAN SECTION N/A 05/26/2017   Procedure: CESAREAN SECTION;  Surgeon: Linda Hedges, DO;  Location: [facility];  Service: Obstetrics;  Laterality: N/A;  . CHOLECYSTECTOMY  2003  . COLONOSCOPY N/A 11/29/2013   Procedure: COLONOSCOPY;  Surgeon: Rogene Houston, MD;  [facility];  Service: Endoscopy;  Laterality: N/A;  120-rescheduled to 300 Ann notified pt  . GASTRIC BYPASS  2006  . TONSILLECTOMY  1990     SOCIAL HISTORY:  Social History   Socioeconomic History  . Marital status: Married    Spouse name: Not on file  . Number of children: Not on file  . Years of education: Not on file  . Highest education level: Not on file  Occupational History  . Occupation: Hydrographic surveyor: [employer]  Social Needs  . Financial resource strain: Not on file  . Food insecurity:    Worry: Not on file    Inability: Not on file  . Transportation needs:    Medical: Not on file    Non-medical: Not on file  Tobacco Use  . Smoking status: Former Smoker    Packs/day: 0.50    Types: Cigarettes    Start date: 03/21/2013    Last attempt to quit: 06/02/2015    Years since quitting: 3.4  . Smokeless tobacco: Never Used  Substance and Sexual Activity  . Alcohol use: No    Alcohol/week: 0.0 standard drinks  . Drug use: No  . Sexual activity: Yes    Birth control/protection: None  Comment: husband had vasectomy  Lifestyle  . Physical activity:    Days per week: Not on file    Minutes per session: Not on file  . Stress: Not on file  Relationships  . Social connections:    Talks on phone: Not on file    Gets together: Not on file    Attends religious service: Not on file    Active member of club or organization: Not on file    Attends meetings of clubs or organizations: Not on file    Relationship status: Not on file  . Intimate partner violence:    Fear of current or ex partner: Not on file    Emotionally abused: Not on file    Physically abused: Not on file    Forced sexual activity: Not on file  Other  Topics Concern  . Not on file  Social History Narrative  . Not on file    FAMILY HISTORY:  Family History  Problem Relation Age of Onset  . Fibromyalgia Mother        Chronic pelvic pain   . Hypothyroidism Mother   . Obesity Mother   . Heart defect Mother        Supraventricular, tachycardia /tachypalpation   . Arrhythmia Mother   . Breast cancer Mother 66  . Hypertension Maternal Grandmother   . Diabetes Maternal Grandmother   . Hypertension Maternal Grandfather   . Heart disease Paternal Grandmother   . Hypertension Paternal Grandmother   . Heart disease Paternal Grandfather   . Hypertension Paternal Grandfather     CURRENT MEDICATIONS:  Outpatient Encounter Medications as of 11/10/2018  Medication Sig  . Cyanocobalamin 1000 MCG/ML KIT Inject 1,000 mcg as directed every 21 ( twenty-one) days.   . ferrous sulfate 300 (60 Fe) MG/5ML syrup Take 5 mLs (300 mg total) by mouth 2 (two) times daily with a meal. Mix in 4oz of orange juice  . levothyroxine (SYNTHROID, LEVOTHROID) 25 MCG tablet Take 25 mcg by mouth daily. Pt taking Mon, Wed, Fri and Sun  . levothyroxine (SYNTHROID, LEVOTHROID) 50 MCG tablet Take 50 mcg by mouth daily before breakfast. Pt taking Tues and Sat  . metoprolol succinate (TOPROL-XL) 25 MG 24 hr tablet Take 1.5 tablets (37.5 mg total) by mouth daily.  . Prenatal Vit-Fe Fumarate-FA (PRENATAL MULTIVITAMIN) TABS tablet Take 1 tablet by mouth 2 (two) times daily.  . progesterone (PROMETRIUM) 200 MG capsule Take 200 mg by mouth daily.  Marland Kitchen nystatin (MYCOSTATIN/NYSTOP) powder Apply topically to rash twice daily  for 1 week , then as needed (Patient not taking: Reported on 11/10/2018)  . [EXPIRED] potassium chloride SA (K-DUR,KLOR-CON) CR tablet 40 mEq    No facility-administered encounter medications on file as of 11/10/2018.     ALLERGIES:  Allergies  Allergen Reactions  . Ciprofloxacin Anaphylaxis  . Morphine And Related Anaphylaxis  . Oseltamivir Hives  .  Penicillins Anaphylaxis    Has patient had a PCN reaction causing immediate rash, facial/tongue/throat swelling, SOB or lightheadedness with hypotension: No Has patient had a PCN reaction causing severe rash involving mucus membranes or skin necrosis: No Has patient had a PCN reaction that required hospitalization: No Has patient had a PCN reaction occurring within the last 10 years: No If all of the above answers are "NO", then may proceed with Cephalosporin use.     PHYSICAL EXAM:  ECOG Performance status: 1  Vitals:   11/10/18 1507  BP: 110/66  Pulse: (!) 101  Resp: 20  Temp: 98.3 F (36.8 C)  SpO2: 100%   Filed Weights   11/10/18 1507  Weight: 231 lb 8 oz (105 kg)    Physical Exam Constitutional:      Appearance: Normal appearance. She is normal weight.  Musculoskeletal: Normal range of motion.  Skin:    General: Skin is warm and dry.  Neurological:     Mental Status: She is alert and oriented to person, place, and time. Mental status is at baseline.  Psychiatric:        Mood and Affect: Mood normal.        Behavior: Behavior normal.        Thought Content: Thought content normal.        Judgment: Judgment normal.      LABORATORY DATA:  I have reviewed the labs as listed.  CBC    Component Value Date/Time   WBC 7.5 11/04/2018 1023   RBC 4.19 11/04/2018 1023   HGB 12.4 11/04/2018 1023   HCT 37.5 11/04/2018 1023   PLT 213 11/04/2018 1023   MCV 89.5 11/04/2018 1023   MCH 29.6 11/04/2018 1023   MCHC 33.1 11/04/2018 1023   RDW 13.6 11/04/2018 1023   LYMPHSABS 1.1 11/04/2018 1023   MONOABS 0.6 11/04/2018 1023   EOSABS 0.2 11/04/2018 1023   BASOSABS 0.1 11/04/2018 1023   CMP Latest Ref Rng & Units 11/04/2018 09/07/2018 03/07/2018  Glucose 70 - 99 mg/dL 92 102(H) 90  BUN 6 - 20 mg/dL 9 5(L) 11  Creatinine 0.44 - 1.00 mg/dL 0.58 0.58 0.78  Sodium 135 - 145 mmol/L 136 137 140  Potassium 3.5 - 5.1 mmol/L 3.0(L) 3.7 3.5  Chloride 98 - 111 mmol/L 108 109 102    CO2 22 - 32 mmol/L 17(L) 22 29  Calcium 8.9 - 10.3 mg/dL 8.6(L) 8.4(L) 8.7(L)  Total Protein 6.5 - 8.1 g/dL 6.1(L) 6.0(L) -  Total Bilirubin 0.3 - 1.2 mg/dL 0.3 0.6 -  Alkaline Phos 38 - 126 U/L 67 99 -  AST 15 - 41 U/L 69(H) 14(L) -  ALT 0 - 44 U/L 40 15 -       DIAGNOSTIC IMAGING:  I have independently reviewed the scans and discussed with the patient.   I have reviewed Francene Finders, NP's note and agree with the documentation.  I personally performed a face-to-face visit, made revisions and my assessment and plan is as follows.    ASSESSMENT & PLAN:   Iron deficiency anemia 1.  Iron deficiency anemia: -Due to malabsorption from gastric bypass surgery in 2006 and blood loss from menstruation. -Last treated with Injectafer on 03/18/2018 and 03/31/2018.  Patient felt better after that. - Patient is currently [redacted] weeks pregnant and carrying twins.  EDD is on 03/14/2018. - She had subchorionic hematoma and had slight bleeding from weeks 6 through week 10 during the current pregnancy. - Apparently she was having some problems with her cervix.  She was prescribed vaginal progesterone. -She is taking iron elixir 5 mL daily.  She does have occasional constipation. -We reviewed her blood work.  Hemoglobin is normal.  Ferritin has improved.  B12 has also improved to normal.  She has mild hypokalemia with a potassium of 3.1.  We will give her potassium 40 mEq today.  She has mild elevation of AST which we will keep an eye on.  She will come back in 4 weeks with repeat blood work.  She has also low albumin.  2.  B12 deficiency: - At last  visit we switched her B12 injections to every 3 weeks. -Today her B12 is within normal limits.  3.  Health maintenance: -She did receive the flu vaccine at last visit.      Orders placed this encounter:  Orders Placed This Encounter  Procedures  . CBC with Differential/Platelet  . Comprehensive metabolic panel  . Ferritin  . Iron and TIBC  .  Vitamin B12  . Folate      Derek Jack, MD [place] [phone]

## 2018-11-10 NOTE — Progress Notes (Signed)
VO received by Dr. Delton Coombes to give pt 56meq of K+.

## 2018-11-10 NOTE — Assessment & Plan Note (Signed)
1.  Iron deficiency anemia: -Due to malabsorption from gastric bypass surgery in 2006 and blood loss from menstruation. -Last treated with Injectafer on 03/18/2018 and 03/31/2018.  Patient felt better after that. - Patient is currently [redacted] weeks pregnant and carrying twins.  EDD is on 03/14/2018. - She had subchorionic hematoma and had slight bleeding from weeks 6 through week 10 during the current pregnancy. - Apparently she was having some problems with her cervix.  She was prescribed vaginal progesterone. -She is taking iron elixir 5 mL daily.  She does have occasional constipation. -We reviewed her blood work.  Hemoglobin is normal.  Ferritin has improved.  B12 has also improved to normal.  She has mild hypokalemia with a potassium of 3.1.  We will give her potassium 40 mEq today.  She has mild elevation of AST which we will keep an eye on.  She will come back in 4 weeks with repeat blood work.  She has also low albumin.  2.  B12 deficiency: - At last visit we switched her B12 injections to every 3 weeks. -Today her B12 is within normal limits.  3.  Health maintenance: -She did receive the flu vaccine at last visit.

## 2018-11-10 NOTE — Patient Instructions (Signed)
[facility] at [facility] Discharge Instructions     Thank you for choosing [facility] at [facility] to provide your oncology and hematology care.  To afford each patient quality time with our provider, please arrive at least 15 minutes before your scheduled appointment time.   If you have a lab appointment with the Cancer Center please come in thru the  Main Entrance and check in at the main information desk  You need to re-schedule your appointment should you arrive 10 or more minutes late.  We strive to give you quality time with our providers, and arriving late affects you and other patients whose appointments are after yours.  Also, if you no show three or more times for appointments you may be dismissed from the clinic at the providers discretion.     Again, thank you for choosing [facility].  Our hope is that these requests will decrease the amount of time that you wait before being seen by our physicians.       _____________________________________________________________  Should you have questions after your visit to [facility], please contact our office at [phone] between the hours of 8:00 a.m. and 4:30 p.m.  Voicemails left after 4:00 p.m. will not be returned until the following business day.  For prescription refill requests, have your pharmacy contact our office and allow 72 hours.    Cancer Center Support Programs:   > Cancer Support Group  2nd Tuesday of the month 1pm-2pm, Journey Room    

## 2018-11-11 ENCOUNTER — Other Ambulatory Visit: Payer: Self-pay | Admitting: Cardiovascular Disease

## 2018-11-11 DIAGNOSIS — G43711 Chronic migraine without aura, intractable, with status migrainosus: Secondary | ICD-10-CM | POA: Diagnosis not present

## 2018-11-14 DIAGNOSIS — O30042 Twin pregnancy, dichorionic/diamniotic, second trimester: Secondary | ICD-10-CM | POA: Diagnosis not present

## 2018-11-14 DIAGNOSIS — O3432 Maternal care for cervical incompetence, second trimester: Secondary | ICD-10-CM | POA: Diagnosis not present

## 2018-11-14 DIAGNOSIS — Z3A22 22 weeks gestation of pregnancy: Secondary | ICD-10-CM | POA: Diagnosis not present

## 2018-11-17 DIAGNOSIS — Z3A23 23 weeks gestation of pregnancy: Secondary | ICD-10-CM | POA: Diagnosis not present

## 2018-11-17 DIAGNOSIS — O3432 Maternal care for cervical incompetence, second trimester: Secondary | ICD-10-CM | POA: Diagnosis not present

## 2018-11-18 DIAGNOSIS — Z3A23 23 weeks gestation of pregnancy: Secondary | ICD-10-CM | POA: Diagnosis not present

## 2018-11-21 ENCOUNTER — Ambulatory Visit: Payer: BLUE CROSS/BLUE SHIELD | Admitting: Family Medicine

## 2018-11-21 DIAGNOSIS — Z3A23 23 weeks gestation of pregnancy: Secondary | ICD-10-CM | POA: Diagnosis not present

## 2018-11-21 DIAGNOSIS — O3432 Maternal care for cervical incompetence, second trimester: Secondary | ICD-10-CM | POA: Diagnosis not present

## 2018-11-24 DIAGNOSIS — O3432 Maternal care for cervical incompetence, second trimester: Secondary | ICD-10-CM | POA: Diagnosis not present

## 2018-11-24 DIAGNOSIS — Z3A24 24 weeks gestation of pregnancy: Secondary | ICD-10-CM | POA: Diagnosis not present

## 2018-11-29 DIAGNOSIS — Z3A24 24 weeks gestation of pregnancy: Secondary | ICD-10-CM | POA: Diagnosis not present

## 2018-11-29 DIAGNOSIS — O3432 Maternal care for cervical incompetence, second trimester: Secondary | ICD-10-CM | POA: Diagnosis not present

## 2018-12-08 ENCOUNTER — Ambulatory Visit (INDEPENDENT_AMBULATORY_CARE_PROVIDER_SITE_OTHER): Payer: BLUE CROSS/BLUE SHIELD | Admitting: Cardiovascular Disease

## 2018-12-08 ENCOUNTER — Inpatient Hospital Stay (HOSPITAL_COMMUNITY): Payer: BLUE CROSS/BLUE SHIELD | Attending: Hematology

## 2018-12-08 ENCOUNTER — Encounter: Payer: Self-pay | Admitting: Cardiovascular Disease

## 2018-12-08 ENCOUNTER — Ambulatory Visit (HOSPITAL_COMMUNITY): Payer: BLUE CROSS/BLUE SHIELD | Admitting: Hematology

## 2018-12-08 VITALS — BP 98/64 | HR 104 | Ht 61.0 in | Wt 235.2 lb

## 2018-12-08 DIAGNOSIS — E538 Deficiency of other specified B group vitamins: Secondary | ICD-10-CM | POA: Insufficient documentation

## 2018-12-08 DIAGNOSIS — Z9889 Other specified postprocedural states: Secondary | ICD-10-CM

## 2018-12-08 DIAGNOSIS — O99012 Anemia complicating pregnancy, second trimester: Secondary | ICD-10-CM | POA: Insufficient documentation

## 2018-12-08 DIAGNOSIS — R002 Palpitations: Secondary | ICD-10-CM | POA: Diagnosis not present

## 2018-12-08 DIAGNOSIS — O3432 Maternal care for cervical incompetence, second trimester: Secondary | ICD-10-CM | POA: Diagnosis not present

## 2018-12-08 DIAGNOSIS — O30042 Twin pregnancy, dichorionic/diamniotic, second trimester: Secondary | ICD-10-CM | POA: Diagnosis not present

## 2018-12-08 DIAGNOSIS — Z8679 Personal history of other diseases of the circulatory system: Secondary | ICD-10-CM

## 2018-12-08 DIAGNOSIS — D508 Other iron deficiency anemias: Secondary | ICD-10-CM

## 2018-12-08 DIAGNOSIS — E876 Hypokalemia: Secondary | ICD-10-CM

## 2018-12-08 DIAGNOSIS — Z3A26 26 weeks gestation of pregnancy: Secondary | ICD-10-CM | POA: Diagnosis not present

## 2018-12-08 DIAGNOSIS — D509 Iron deficiency anemia, unspecified: Secondary | ICD-10-CM | POA: Insufficient documentation

## 2018-12-08 LAB — CBC WITH DIFFERENTIAL/PLATELET
Abs Immature Granulocytes: 0.19 10*3/uL — ABNORMAL HIGH (ref 0.00–0.07)
Basophils Absolute: 0.1 10*3/uL (ref 0.0–0.1)
Basophils Relative: 1 %
Eosinophils Absolute: 0.2 10*3/uL (ref 0.0–0.5)
Eosinophils Relative: 2 %
HCT: 35.8 % — ABNORMAL LOW (ref 36.0–46.0)
Hemoglobin: 11.6 g/dL — ABNORMAL LOW (ref 12.0–15.0)
Immature Granulocytes: 2 %
Lymphocytes Relative: 13 %
Lymphs Abs: 1.2 10*3/uL (ref 0.7–4.0)
MCH: 29.6 pg (ref 26.0–34.0)
MCHC: 32.4 g/dL (ref 30.0–36.0)
MCV: 91.3 fL (ref 80.0–100.0)
Monocytes Absolute: 0.5 10*3/uL (ref 0.1–1.0)
Monocytes Relative: 6 %
Neutro Abs: 7.1 10*3/uL (ref 1.7–7.7)
Neutrophils Relative %: 76 %
Platelets: 186 10*3/uL (ref 150–400)
RBC: 3.92 MIL/uL (ref 3.87–5.11)
RDW: 14 % (ref 11.5–15.5)
WBC: 9.2 10*3/uL (ref 4.0–10.5)
nRBC: 0 % (ref 0.0–0.2)

## 2018-12-08 LAB — COMPREHENSIVE METABOLIC PANEL
ALT: 8 U/L (ref 0–44)
AST: 14 U/L — ABNORMAL LOW (ref 15–41)
Albumin: 2.7 g/dL — ABNORMAL LOW (ref 3.5–5.0)
Alkaline Phosphatase: 99 U/L (ref 38–126)
Anion gap: 8 (ref 5–15)
BUN: 9 mg/dL (ref 6–20)
CO2: 17 mmol/L — ABNORMAL LOW (ref 22–32)
Calcium: 8.8 mg/dL — ABNORMAL LOW (ref 8.9–10.3)
Chloride: 113 mmol/L — ABNORMAL HIGH (ref 98–111)
Creatinine, Ser: 0.47 mg/dL (ref 0.44–1.00)
GFR calc Af Amer: >60 mL/min (ref >60)
GFR calc non Af Amer: >60 mL/min (ref >60)
Glucose, Bld: 97 mg/dL (ref 70–99)
Potassium: 3.6 mmol/L (ref 3.5–5.1)
Sodium: 138 mmol/L (ref 135–145)
Total Bilirubin: <0.1 mg/dL — ABNORMAL LOW (ref 0.3–1.2)
Total Protein: 6 g/dL — ABNORMAL LOW (ref 6.5–8.1)

## 2018-12-08 LAB — VITAMIN B12: Vitamin B-12: 155 pg/mL — ABNORMAL LOW (ref 180–914)

## 2018-12-08 LAB — FOLATE: Folate: 12.7 ng/mL (ref >5.9)

## 2018-12-08 LAB — IRON AND TIBC
Iron: 67 ug/dL (ref 28–170)
Saturation Ratios: 16 % (ref 10.4–31.8)
TIBC: 424 ug/dL (ref 250–450)
UIBC: 357 ug/dL

## 2018-12-08 LAB — FERRITIN: Ferritin: 57 ng/mL (ref 11–307)

## 2018-12-08 NOTE — Progress Notes (Signed)
SUBJECTIVE: The patient presents for follow-up of palpitations.  Event monitoring demonstrated an average heart rate of 99 bpm.  Sinus rhythm was predominantly seen with isolated PACs and rare couplets.  There were occasional PVCs (2.6%) and rare couplets.  Ventricular bigeminy and trigeminy were present.  She is now on bed rest.  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  She asks about potentially increasing the dose of beta-blocker.  Blood pressure is 98/64 today.  We talked about the potential ramifications at length.      Review of Systems: As per "subjective", otherwise negative.  Allergies  Allergen Reactions  . Ciprofloxacin Anaphylaxis  . Morphine And Related Anaphylaxis  . Penicillins Anaphylaxis    Has patient had a PCN reaction causing immediate rash, facial/tongue/throat swelling, SOB or lightheadedness with hypotension: No Has patient had a PCN reaction causing severe rash involving mucus membranes or skin necrosis: No Has patient had a PCN reaction that required hospitalization: No Has patient had a PCN reaction occurring within the last 10 years: No If all of the above answers are "NO", then may proceed with Cephalosporin use.    Current Outpatient Medications  Medication Sig Dispense Refill  . Cyanocobalamin 1000 MCG/ML KIT Inject 1,000 mcg as directed every 21 ( twenty-one) days.     . ferrous sulfate 300 (60 Fe) MG/5ML syrup Take 5 mLs (300 mg total) by mouth 2 (two) times daily with a meal. Mix in 4oz of orange juice 150 mL 3  . levothyroxine (SYNTHROID, LEVOTHROID) 25 MCG tablet Take 25 mcg by mouth daily. Pt taking Mon, Wed, Fri and Sun    . levothyroxine (SYNTHROID, LEVOTHROID) 50 MCG tablet Take 50 mcg by mouth daily before breakfast. Pt taking Tues and Sat  2  . metoprolol succinate (TOPROL-XL) 25 MG 24 hr tablet TAKE ONE AND ONE-HALF TABLETS BY MOUTH DAILY 135 tablet 2  . nystatin (MYCOSTATIN/NYSTOP) powder Apply  topically to rash twice daily  for 1 week , then as needed 60 g 1  . Prenatal Vit-Fe Fumarate-FA (PRENATAL MULTIVITAMIN) TABS tablet Take 1 tablet by mouth 2 (two) times daily.    . progesterone (PROMETRIUM) 200 MG capsule Take 200 mg by mouth daily.     No current facility-administered medications for this visit.     Past Medical History:  Diagnosis Date  . Asthma   . Atrial flutter ([place])    Status post RFA by Dr. Lovena Le in 2004  . Chronic anxiety   . Chronic depression   . 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 Iron deficiency   . Iron deficiency anemia 10/02/2010   Qualifier: Diagnosis of  By: Moshe Cipro MD, Joycelyn Schmid    . Low vitamin B12 level 07/10/2013   Overview:  Last Assessment & Plan:  Being treated through hematology  . Newborn product of in vitro fertilization (IVF) pregnancy   . Obesity     Past Surgical History:  Procedure Laterality Date  . Bilateral eustachian tube placement on 4 different occasions    . CARDIAC ELECTROPHYSIOLOGY MAPPING AND ABLATION    . CESAREAN SECTION N/A 05/26/2017   Procedure: CESAREAN SECTION;  Surgeon: Linda Hedges, DO;  Location: [place];  Service: Obstetrics;  Laterality: N/A;  . CHOLECYSTECTOMY  2003  . COLONOSCOPY N/A 11/29/2013   Procedure: COLONOSCOPY;  Surgeon: Rogene Houston, MD;  [facility];  Service: Endoscopy;  Laterality: N/A;  120-rescheduled  to [place] notified pt  . GASTRIC BYPASS  2006  . TONSILLECTOMY  1990    Social History   Socioeconomic History  . Marital status: Married    Spouse name: Not on file  . Number of children: Not on file  . Years of education: Not on file  . Highest education level: Not on file  Occupational History  . Occupation: Hydrographic surveyor: [employer]  Social Needs  . Financial resource strain: Not on file  . Food insecurity:    Worry: Not on file    Inability: Not on file  . Transportation needs:    Medical: Not on file    Non-medical: Not on file  Tobacco Use  .  Smoking status: Former Smoker    Packs/day: 0.50    Types: Cigarettes    Start date: 03/21/2013    Last attempt to quit: 06/02/2015    Years since quitting: 3.5  . Smokeless tobacco: Never Used  Substance and Sexual Activity  . Alcohol use: No    Alcohol/week: 0.0 standard drinks  . Drug use: No  . Sexual activity: Yes    Birth control/protection: None    Comment: husband had vasectomy  Lifestyle  . Physical activity:    Days per week: Not on file    Minutes per session: Not on file  . Stress: Not on file  Relationships  . Social connections:    Talks on phone: Not on file    Gets together: Not on file    Attends religious service: Not on file    Active member of club or organization: Not on file    Attends meetings of clubs or organizations: Not on file    Relationship status: Not on file  . Intimate partner violence:    Fear of current or ex partner: Not on file    Emotionally abused: Not on file    Physically abused: Not on file    Forced sexual activity: Not on file  Other Topics Concern  . Not on file  Social History Narrative  . Not on file     Vitals:   12/08/18 0812  BP: 98/64  Pulse: (!) 104  SpO2: 98%  Weight: 235 lb 3.2 oz (106.7 kg)  Height: '5\' 1"'$  (1.549 m)    Wt Readings from Last 3 Encounters:  12/08/18 235 lb 3.2 oz (106.7 kg)  11/10/18 231 lb 8 oz (105 kg)  10/04/18 227 lb (103 kg)     PHYSICAL EXAM General: NAD HEENT: Normal. Neck: No JVD, no thyromegaly. Lungs: Clear to auscultation bilaterally with normal respiratory effort. CV: Heart rate at upper normal limits, regular rhythm, normal S1/S2, no S3/S4, no murmur. No pretibial or periankle edema. Abdomen: Gravid.  Neurologic: Alert and oriented.  Psych: Normal affect. Skin: Normal. Musculoskeletal: No gross deformities.    ECG: Reviewed above under Subjective   Labs: Lab Results  Component Value Date/Time   K 3.0 (L) 11/04/2018 10:23 AM   BUN 9 11/04/2018 10:23 AM    CREATININE 0.58 11/04/2018 10:23 AM   CREATININE 0.83 06/05/2014 10:44 AM   ALT 40 11/04/2018 10:23 AM   TSH 2.45 05/10/2018 08:05 AM   HGB 12.4 11/04/2018 10:23 AM     Lipids: Lab Results  Component Value Date/Time   LDLCALC 90 05/10/2018 08:05 AM   CHOL 172 05/10/2018 08:05 AM   TRIG 94 05/10/2018 08:05 AM   HDL 63 05/10/2018 08:05 AM  ASSESSMENT AND PLAN: 1.  Palpitations: Event monitoring results reviewed above with rare PACs, occasional PVCs, and ventricular bigeminy and trigeminy on occasion.  No concerning arrhythmias were seen.  Given her low normal blood pressure, I am not inclined to increase the dose of beta-blocker as I want to make certain that her uterus and twins are well perfused.  We talked about the possibility of potentially adding an extra 12.5 mg of Toprol-XL every evening well after she has delivered.  We will reevaluate symptoms at that time.  For the time being, continue Toprol-XL 37.5 mg daily.    Disposition: Follow up July 2020   Kate Sable, M.D., F.A.C.C.

## 2018-12-08 NOTE — Patient Instructions (Signed)
Medication Instructions: Your physician recommends that you continue on your current medications as directed. Please refer to the Current Medication list given to you today.   If you need a refill on your cardiac medications before your next appointment, please call your pharmacy.   Lab work: None today If you have labs (blood work) drawn today and your tests are completely normal, you will receive your results only by: Marland Kitchen MyChart Message (if you have MyChart) OR . A paper copy in the mail If you have any lab test that is abnormal or we need to change your treatment, we will call you to review the results.  Testing/Procedures: None today  Follow-Up: At [facility], you and your health needs are our priority.  As part of our continuing mission to provide you with exceptional heart care, we have created designated Provider Care Teams.  These Care Teams include your primary Cardiologist (physician) and Advanced Practice Providers (APPs -  Physician Assistants and Nurse Practitioners) who all work together to provide you with the care you need, when you need it. You will need a follow up appointment in 5 months.  Please call our office 2 months in advance to schedule this appointment.  You may see Kate Sable, MD or one of the following Advanced Practice Providers on your designated Care Team:   Bernerd Pho, PA-C [facility]) . Ermalinda Barrios, PA-C ([place])  Any Other Special Instructions Will Be Listed Below (If Applicable). None

## 2018-12-14 DIAGNOSIS — O3432 Maternal care for cervical incompetence, second trimester: Secondary | ICD-10-CM | POA: Diagnosis not present

## 2018-12-14 DIAGNOSIS — O30042 Twin pregnancy, dichorionic/diamniotic, second trimester: Secondary | ICD-10-CM | POA: Diagnosis not present

## 2018-12-14 DIAGNOSIS — Z3A27 27 weeks gestation of pregnancy: Secondary | ICD-10-CM | POA: Diagnosis not present

## 2018-12-22 ENCOUNTER — Other Ambulatory Visit (HOSPITAL_COMMUNITY): Payer: Self-pay | Admitting: Nurse Practitioner

## 2018-12-22 ENCOUNTER — Encounter (HOSPITAL_COMMUNITY): Payer: Self-pay | Admitting: Internal Medicine

## 2018-12-22 ENCOUNTER — Other Ambulatory Visit (HOSPITAL_COMMUNITY): Payer: Self-pay | Admitting: Pharmacist

## 2018-12-22 ENCOUNTER — Inpatient Hospital Stay (HOSPITAL_BASED_OUTPATIENT_CLINIC_OR_DEPARTMENT_OTHER): Payer: BLUE CROSS/BLUE SHIELD | Admitting: Hematology

## 2018-12-22 VITALS — BP 111/65 | HR 100 | Temp 98.4°F | Resp 16 | Wt 238.0 lb

## 2018-12-22 DIAGNOSIS — O99012 Anemia complicating pregnancy, second trimester: Secondary | ICD-10-CM

## 2018-12-22 DIAGNOSIS — D509 Iron deficiency anemia, unspecified: Secondary | ICD-10-CM

## 2018-12-22 DIAGNOSIS — E538 Deficiency of other specified B group vitamins: Secondary | ICD-10-CM | POA: Diagnosis not present

## 2018-12-22 DIAGNOSIS — R7989 Other specified abnormal findings of blood chemistry: Secondary | ICD-10-CM

## 2018-12-22 DIAGNOSIS — D508 Other iron deficiency anemias: Secondary | ICD-10-CM

## 2018-12-22 NOTE — Progress Notes (Signed)
[place], [place] [address]   CLINIC:  Medical Oncology/Hematology  PCP:  Fayrene Helper, MD [address], [place] [address] [phone]   REASON FOR VISIT: Follow-up for iron deficiency and B12 deficiency anemia.  CURRENT THERAPY:Intermittent Feraheme infusions and continuous B12 injections.   INTERVAL HISTORY:  Ms. Tays 33 y.o. female returns for routine follow-up for iron deficiency and B12 deficiency anemia. She is feeling very fatigued. She is taking her vitamin B12 injection every 3 weeks. She will start the injections every 2 weeks for now. Denies any nausea, vomiting, or diarrhea. Denies any new pains. Had not noticed any recent bleeding such as epistaxis, hematuria or hematochezia. Denies recent chest pain on exertion, shortness of breath on minimal exertion, pre-syncopal episodes, or palpitations. Denies any numbness or tingling in hands or feet. Denies any recent fevers, infections, or recent hospitalizations. Patient reports appetite at 100% and energy level at 50%.   REVIEW OF SYSTEMS:  Review of Systems  Constitutional: Positive for fatigue.  All other systems reviewed and are negative.    PAST MEDICAL/SURGICAL HISTORY:  Past Medical History:  Diagnosis Date  . Asthma   . Atrial flutter ([place])    Status post RFA by Dr. Lovena Le in 2004  . Chronic anxiety   . Chronic depression   . 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 Iron deficiency   . Iron deficiency anemia 10/02/2010   Qualifier: Diagnosis of  By: Moshe Cipro MD, Joycelyn Schmid    . Low vitamin B12 level 07/10/2013   Overview:  Last Assessment & Plan:  Being treated through hematology  . Newborn product of in vitro fertilization (IVF) pregnancy   . Obesity    Past Surgical History:  Procedure Laterality Date  . Bilateral eustachian tube placement on 4 different occasions    . CARDIAC ELECTROPHYSIOLOGY MAPPING AND ABLATION    . CESAREAN SECTION N/A  05/26/2017   Procedure: CESAREAN SECTION;  Surgeon: Linda Hedges, DO;  Location: [place];  Service: Obstetrics;  Laterality: N/A;  . CHOLECYSTECTOMY  2003  . COLONOSCOPY N/A 11/29/2013   Procedure: COLONOSCOPY;  Surgeon: Rogene Houston, MD;  [facility];  Service: Endoscopy;  Laterality: N/A;  120-rescheduled to [address] notified pt  . GASTRIC BYPASS  2006  . TONSILLECTOMY  1990     SOCIAL HISTORY:  Social History   Socioeconomic History  . Marital status: Married    Spouse name: Not on file  . Number of children: Not on file  . Years of education: Not on file  . Highest education level: Not on file  Occupational History  . Occupation: Hydrographic surveyor: [employer]  Social Needs  . Financial resource strain: Not on file  . Food insecurity:    Worry: Not on file    Inability: Not on file  . Transportation needs:    Medical: Not on file    Non-medical: Not on file  Tobacco Use  . Smoking status: Former Smoker    Packs/day: 0.50    Types: Cigarettes    Start date: 03/21/2013    Last attempt to quit: 06/02/2015    Years since quitting: 3.5  . Smokeless tobacco: Never Used  Substance and Sexual Activity  . Alcohol use: No    Alcohol/week: 0.0 standard drinks  . Drug use: No  . Sexual activity: Yes    Birth control/protection: None    Comment: husband had  vasectomy  Lifestyle  . Physical activity:    Days per week: Not on file    Minutes per session: Not on file  . Stress: Not on file  Relationships  . Social connections:    Talks on phone: Not on file    Gets together: Not on file    Attends religious service: Not on file    Active member of club or organization: Not on file    Attends meetings of clubs or organizations: Not on file    Relationship status: Not on file  . Intimate partner violence:    Fear of current or ex partner: Not on file    Emotionally abused: Not on file    Physically abused: Not on file    Forced sexual activity: Not on  file  Other Topics Concern  . Not on file  Social History Narrative  . Not on file    FAMILY HISTORY:  Family History  Problem Relation Age of Onset  . Fibromyalgia Mother        Chronic pelvic pain   . Hypothyroidism Mother   . Obesity Mother   . Heart defect Mother        Supraventricular, tachycardia /tachypalpation   . Arrhythmia Mother   . Breast cancer Mother 1  . Hypertension Maternal Grandmother   . Diabetes Maternal Grandmother   . Hypertension Maternal Grandfather   . Heart disease Paternal Grandmother   . Hypertension Paternal Grandmother   . Heart disease Paternal Grandfather   . Hypertension Paternal Grandfather     CURRENT MEDICATIONS:  Outpatient Encounter Medications as of 12/22/2018  Medication Sig  . Cyanocobalamin 1000 MCG/ML KIT Inject 1,000 mcg as directed every 21 ( twenty-one) days.   . ferrous sulfate 300 (60 Fe) MG/5ML syrup Take 5 mLs (300 mg total) by mouth 2 (two) times daily with a meal. Mix in 4oz of orange juice  . levothyroxine (SYNTHROID, LEVOTHROID) 25 MCG tablet Take 25 mcg by mouth daily. Pt taking Mon, Wed, Fri and Sun  . levothyroxine (SYNTHROID, LEVOTHROID) 50 MCG tablet Take 50 mcg by mouth daily before breakfast. Pt taking Tues and Sat  . metoprolol succinate (TOPROL-XL) 25 MG 24 hr tablet TAKE ONE AND ONE-HALF TABLETS BY MOUTH DAILY  . nystatin (MYCOSTATIN/NYSTOP) powder Apply topically to rash twice daily  for 1 week , then as needed  . Prenatal Vit-Fe Fumarate-FA (PRENATAL MULTIVITAMIN) TABS tablet Take 1 tablet by mouth 2 (two) times daily.  . progesterone (PROMETRIUM) 200 MG capsule Take 200 mg by mouth daily.   No facility-administered encounter medications on file as of 12/22/2018.     ALLERGIES:  Allergies  Allergen Reactions  . Ciprofloxacin Anaphylaxis  . Morphine And Related Anaphylaxis  . Penicillins Anaphylaxis    Has patient had a PCN reaction causing immediate rash, facial/tongue/throat swelling, SOB or  lightheadedness with hypotension: No Has patient had a PCN reaction causing severe rash involving mucus membranes or skin necrosis: No Has patient had a PCN reaction that required hospitalization: No Has patient had a PCN reaction occurring within the last 10 years: No If all of the above answers are "NO", then may proceed with Cephalosporin use.     PHYSICAL EXAM:  ECOG Performance status: 1  Vitals:   12/22/18 1103  BP: 111/65  Pulse: 100  Resp: 16  Temp: 98.4 F (36.9 C)  SpO2: 98%   Filed Weights   12/22/18 1103  Weight: 238 lb (108 kg)    Physical  Exam Constitutional:      Appearance: Normal appearance. She is normal weight.  Musculoskeletal: Normal range of motion.  Skin:    General: Skin is warm and dry.  Neurological:     Mental Status: She is alert and oriented to person, place, and time. Mental status is at baseline.  Psychiatric:        Mood and Affect: Mood normal.        Behavior: Behavior normal.        Thought Content: Thought content normal.        Judgment: Judgment normal.      LABORATORY DATA:  I have reviewed the labs as listed.  CBC    Component Value Date/Time   WBC 9.2 12/08/2018 0859   RBC 3.92 12/08/2018 0859   HGB 11.6 (L) 12/08/2018 0859   HCT 35.8 (L) 12/08/2018 0859   PLT 186 12/08/2018 0859   MCV 91.3 12/08/2018 0859   MCH 29.6 12/08/2018 0859   MCHC 32.4 12/08/2018 0859   RDW 14.0 12/08/2018 0859   LYMPHSABS 1.2 12/08/2018 0859   MONOABS 0.5 12/08/2018 0859   EOSABS 0.2 12/08/2018 0859   BASOSABS 0.1 12/08/2018 0859   CMP Latest Ref Rng & Units 12/08/2018 11/04/2018 09/07/2018  Glucose 70 - 99 mg/dL 97 92 102(H)  BUN 6 - 20 mg/dL 9 9 5(L)  Creatinine 0.44 - 1.00 mg/dL 0.47 0.58 0.58  Sodium 135 - 145 mmol/L 138 136 137  Potassium 3.5 - 5.1 mmol/L 3.6 3.0(L) 3.7  Chloride 98 - 111 mmol/L 113(H) 108 109  CO2 22 - 32 mmol/L 17(L) 17(L) 22  Calcium 8.9 - 10.3 mg/dL 8.8(L) 8.6(L) 8.4(L)  Total Protein 6.5 - 8.1 g/dL 6.0(L)  6.1(L) 6.0(L)  Total Bilirubin 0.3 - 1.2 mg/dL <0.1(L) 0.3 0.6  Alkaline Phos 38 - 126 U/L 99 67 99  AST 15 - 41 U/L 14(L) 69(H) 14(L)  ALT 0 - 44 U/L 8 40 15       DIAGNOSTIC IMAGING:  I have independently reviewed the scans and discussed with the patient.   I have reviewed Francene Finders, NP's note and agree with the documentation.  I personally performed a face-to-face visit, made revisions and my assessment and plan is as follows.    ASSESSMENT & PLAN:   Iron deficiency anemia 1.  Iron deficiency anemia: -Due to malabsorption from gastric bypass surgery in 2006 and blood loss from menstruation. -Last treated with Injectafer on 03/18/2018 and 03/31/2018.  Patient felt better after that. - Patient is currently [redacted] weeks pregnant and carrying twins.  EDD is on 03/14/2018. - She had subchorionic hematoma and had slight bleeding from weeks 6 through week 10 during the current pregnancy. - She has vaginal incompetence.  She was prescribed vaginal progesterone. -She is taking iron elixir 5 mL daily.  She does have occasional constipation. -EDD is 03/15/2019.  She is [redacted] weeks gestation today. -We reviewed her blood work.  Hemoglobin is 11.6.  However ferritin dropped to 57 from 281 at last visit.  She is feeling very tired.  She is continuing oral iron therapy without help.  She will receive 1 infusion of Feraheme. -We will see her back in 6 weeks and repeat blood work.  2.  B12 deficiency: - She is taking B12 injection every 3 weeks. -Today her B12 was low at 155.  I have recommended switching her B12 to every 2 weeks.        Orders placed this encounter:  Orders Placed  This Encounter  Procedures  . CBC with Differential/Platelet  . Comprehensive metabolic panel  . Ferritin  . Iron and TIBC  . Vitamin B12  . Folate      Derek Jack, MD [place] [phone]

## 2018-12-22 NOTE — Assessment & Plan Note (Signed)
1.  Iron deficiency anemia: -Due to malabsorption from gastric bypass surgery in 2006 and blood loss from menstruation. -Last treated with Injectafer on 03/18/2018 and 03/31/2018.  Patient felt better after that. - Patient is currently [redacted] weeks pregnant and carrying twins.  EDD is on 03/14/2018. - She had subchorionic hematoma and had slight bleeding from weeks 6 through week 10 during the current pregnancy. - She has vaginal incompetence.  She was prescribed vaginal progesterone. -She is taking iron elixir 5 mL daily.  She does have occasional constipation. -EDD is 03/15/2019.  She is [redacted] weeks gestation today. -We reviewed her blood work.  Hemoglobin is 11.6.  However ferritin dropped to 57 from 281 at last visit.  She is feeling very tired.  She is continuing oral iron therapy without help.  She will receive 1 infusion of Feraheme. -We will see her back in 6 weeks and repeat blood work.  2.  B12 deficiency: - She is taking B12 injection every 3 weeks. -Today her B12 was low at 155.  I have recommended switching her B12 to every 2 weeks.

## 2018-12-22 NOTE — Patient Instructions (Signed)
[facility] at [facility] Discharge Instructions     Thank you for choosing [facility] at [facility] to provide your oncology and hematology care.  To afford each patient quality time with our provider, please arrive at least 15 minutes before your scheduled appointment time.   If you have a lab appointment with the Cancer Center please come in thru the  Main Entrance and check in at the main information desk  You need to re-schedule your appointment should you arrive 10 or more minutes late.  We strive to give you quality time with our providers, and arriving late affects you and other patients whose appointments are after yours.  Also, if you no show three or more times for appointments you may be dismissed from the clinic at the providers discretion.     Again, thank you for choosing [facility].  Our hope is that these requests will decrease the amount of time that you wait before being seen by our physicians.       _____________________________________________________________  Should you have questions after your visit to [facility], please contact our office at [phone] between the hours of 8:00 a.m. and 4:30 p.m.  Voicemails left after 4:00 p.m. will not be returned until the following business day.  For prescription refill requests, have your pharmacy contact our office and allow 72 hours.    Cancer Center Support Programs:   > Cancer Support Group  2nd Tuesday of the month 1pm-2pm, Journey Room    

## 2018-12-23 ENCOUNTER — Ambulatory Visit (HOSPITAL_COMMUNITY): Payer: BLUE CROSS/BLUE SHIELD | Admitting: Hematology

## 2018-12-23 ENCOUNTER — Other Ambulatory Visit (HOSPITAL_COMMUNITY): Payer: Self-pay | Admitting: Hematology

## 2018-12-23 DIAGNOSIS — O30043 Twin pregnancy, dichorionic/diamniotic, third trimester: Secondary | ICD-10-CM | POA: Diagnosis not present

## 2018-12-23 DIAGNOSIS — O3433 Maternal care for cervical incompetence, third trimester: Secondary | ICD-10-CM | POA: Diagnosis not present

## 2018-12-23 DIAGNOSIS — Z3689 Encounter for other specified antenatal screening: Secondary | ICD-10-CM | POA: Diagnosis not present

## 2018-12-23 DIAGNOSIS — E039 Hypothyroidism, unspecified: Secondary | ICD-10-CM | POA: Diagnosis not present

## 2018-12-23 DIAGNOSIS — Z1389 Encounter for screening for other disorder: Secondary | ICD-10-CM | POA: Diagnosis not present

## 2018-12-23 DIAGNOSIS — Z3A28 28 weeks gestation of pregnancy: Secondary | ICD-10-CM | POA: Diagnosis not present

## 2018-12-27 ENCOUNTER — Other Ambulatory Visit (HOSPITAL_COMMUNITY): Payer: Self-pay | Admitting: Nurse Practitioner

## 2018-12-28 ENCOUNTER — Encounter (HOSPITAL_COMMUNITY): Payer: Self-pay | Admitting: *Deleted

## 2018-12-28 NOTE — Progress Notes (Signed)
I spoke with Dr. Crissie Reese nurse, Olivia Mackie, and she said that she will fax over OB clearance for iron infusion. She states that they use Feraheme and Injectafer all the time with their patients.  They do not require monitoring during infusion nor do they require FHT monitoring before or after infusion.  She said that Mrs. Alejandra Barr is cleared from their standpoint to have infusion in the morning.

## 2018-12-29 ENCOUNTER — Encounter (HOSPITAL_COMMUNITY): Payer: Self-pay

## 2018-12-29 ENCOUNTER — Inpatient Hospital Stay (HOSPITAL_COMMUNITY): Payer: BLUE CROSS/BLUE SHIELD

## 2018-12-29 VITALS — BP 104/61 | HR 91 | Temp 98.1°F | Resp 18

## 2018-12-29 DIAGNOSIS — D509 Iron deficiency anemia, unspecified: Secondary | ICD-10-CM | POA: Diagnosis not present

## 2018-12-29 DIAGNOSIS — O99012 Anemia complicating pregnancy, second trimester: Secondary | ICD-10-CM | POA: Diagnosis not present

## 2018-12-29 DIAGNOSIS — D508 Other iron deficiency anemias: Secondary | ICD-10-CM

## 2018-12-29 DIAGNOSIS — E538 Deficiency of other specified B group vitamins: Secondary | ICD-10-CM | POA: Diagnosis not present

## 2018-12-29 MED ORDER — SODIUM CHLORIDE 0.9% FLUSH
10.0000 mL | Freq: Once | INTRAVENOUS | Status: AC
  Administered 2018-12-29: 10 mL via INTRAVENOUS

## 2018-12-29 MED ORDER — SODIUM CHLORIDE 0.9 % IV SOLN: Freq: Once | INTRAVENOUS | Status: DC

## 2018-12-29 MED ORDER — SODIUM CHLORIDE 0.9 % IV SOLN
INTRAVENOUS | Status: DC
  Administered 2018-12-29: 09:00:00 via INTRAVENOUS

## 2018-12-29 MED ORDER — SODIUM CHLORIDE 0.9 % IV SOLN
510.0000 mg | Freq: Once | INTRAVENOUS | Status: AC
  Administered 2018-12-29: 510 mg via INTRAVENOUS
  Filled 2018-12-29: qty 17

## 2018-12-29 NOTE — Progress Notes (Signed)
Clearance for iron infusion was given by Dr. Crissie Reese nurse, Olivia Mackie, with faxed OB clearance sent to the cancer center.    Patient tolerated iron infusion with no complaints voiced.  Peripheral site clean and dry with no bruising or swelling noted at site.  Good blood return noted before and after infusion.  Band aid applied.  VSs with discharge and left ambulatory with no s/s of distress noted.

## 2018-12-29 NOTE — Patient Instructions (Signed)
[facility] at [facility]  Discharge Instructions:   _______________________________________________________________  Thank you for choosing [facility] at [facility] to provide your oncology and hematology care.  To afford each patient quality time with our providers, please arrive at least 15 minutes before your scheduled appointment.  You need to re-schedule your appointment if you arrive 10 or more minutes late.  We strive to give you quality time with our providers, and arriving late affects you and other patients whose appointments are after yours.  Also, if you no show three or more times for appointments you may be dismissed from the clinic.  Again, thank you for choosing [facility] at [facility]. Our hope is that these requests will allow you access to exceptional care and in a timely manner. _______________________________________________________________  If you have questions after your visit, please contact our office at [phone] between the hours of 8:30 a.m. and 5:00 p.m. Voicemails left after 4:30 p.m. will not be returned until the following business day. _______________________________________________________________  For prescription refill requests, have your pharmacy contact our office. _______________________________________________________________  Recommendations made by the consultant and any test results will be sent to your referring physician. _______________________________________________________________ 

## 2018-12-30 DIAGNOSIS — Z3A29 29 weeks gestation of pregnancy: Secondary | ICD-10-CM | POA: Diagnosis not present

## 2018-12-30 DIAGNOSIS — O30043 Twin pregnancy, dichorionic/diamniotic, third trimester: Secondary | ICD-10-CM | POA: Diagnosis not present

## 2018-12-30 DIAGNOSIS — Z23 Encounter for immunization: Secondary | ICD-10-CM | POA: Diagnosis not present

## 2019-01-06 DIAGNOSIS — O30043 Twin pregnancy, dichorionic/diamniotic, third trimester: Secondary | ICD-10-CM | POA: Diagnosis not present

## 2019-01-06 DIAGNOSIS — Z3A30 30 weeks gestation of pregnancy: Secondary | ICD-10-CM | POA: Diagnosis not present

## 2019-01-13 DIAGNOSIS — O30043 Twin pregnancy, dichorionic/diamniotic, third trimester: Secondary | ICD-10-CM | POA: Diagnosis not present

## 2019-01-13 DIAGNOSIS — Z3A31 31 weeks gestation of pregnancy: Secondary | ICD-10-CM | POA: Diagnosis not present

## 2019-01-18 DIAGNOSIS — O30043 Twin pregnancy, dichorionic/diamniotic, third trimester: Secondary | ICD-10-CM | POA: Diagnosis not present

## 2019-01-18 DIAGNOSIS — Z3A32 32 weeks gestation of pregnancy: Secondary | ICD-10-CM | POA: Diagnosis not present

## 2019-01-25 DIAGNOSIS — O30043 Twin pregnancy, dichorionic/diamniotic, third trimester: Secondary | ICD-10-CM | POA: Diagnosis not present

## 2019-01-25 DIAGNOSIS — Z3A33 33 weeks gestation of pregnancy: Secondary | ICD-10-CM | POA: Diagnosis not present

## 2019-02-01 ENCOUNTER — Other Ambulatory Visit: Payer: Self-pay

## 2019-02-01 ENCOUNTER — Inpatient Hospital Stay (HOSPITAL_COMMUNITY): Payer: BLUE CROSS/BLUE SHIELD | Attending: Hematology

## 2019-02-01 DIAGNOSIS — D509 Iron deficiency anemia, unspecified: Secondary | ICD-10-CM | POA: Diagnosis not present

## 2019-02-01 DIAGNOSIS — E538 Deficiency of other specified B group vitamins: Secondary | ICD-10-CM | POA: Insufficient documentation

## 2019-02-01 DIAGNOSIS — O99013 Anemia complicating pregnancy, third trimester: Secondary | ICD-10-CM | POA: Diagnosis not present

## 2019-02-01 DIAGNOSIS — D508 Other iron deficiency anemias: Secondary | ICD-10-CM

## 2019-02-01 DIAGNOSIS — Z3A34 34 weeks gestation of pregnancy: Secondary | ICD-10-CM | POA: Insufficient documentation

## 2019-02-01 LAB — COMPREHENSIVE METABOLIC PANEL
ALT: 8 U/L (ref 0–44)
AST: 17 U/L (ref 15–41)
Albumin: 2.8 g/dL — ABNORMAL LOW (ref 3.5–5.0)
Alkaline Phosphatase: 157 U/L — ABNORMAL HIGH (ref 38–126)
Anion gap: 11 (ref 5–15)
BUN: 7 mg/dL (ref 6–20)
CO2: 16 mmol/L — ABNORMAL LOW (ref 22–32)
Calcium: 8.7 mg/dL — ABNORMAL LOW (ref 8.9–10.3)
Chloride: 110 mmol/L (ref 98–111)
Creatinine, Ser: 0.64 mg/dL (ref 0.44–1.00)
GFR calc Af Amer: >60 mL/min (ref >60)
GFR calc non Af Amer: >60 mL/min (ref >60)
Glucose, Bld: 156 mg/dL — ABNORMAL HIGH (ref 70–99)
Potassium: 3.9 mmol/L (ref 3.5–5.1)
Sodium: 137 mmol/L (ref 135–145)
Total Bilirubin: 0.5 mg/dL (ref 0.3–1.2)
Total Protein: 6.1 g/dL — ABNORMAL LOW (ref 6.5–8.1)

## 2019-02-01 LAB — FERRITIN: Ferritin: 65 ng/mL (ref 11–307)

## 2019-02-01 LAB — IRON AND TIBC
Iron: 119 ug/dL (ref 28–170)
Saturation Ratios: 26 % (ref 10.4–31.8)
TIBC: 462 ug/dL — ABNORMAL HIGH (ref 250–450)
UIBC: 343 ug/dL

## 2019-02-01 LAB — CBC WITH DIFFERENTIAL/PLATELET
Abs Immature Granulocytes: 0.2 10*3/uL — ABNORMAL HIGH (ref 0.00–0.07)
Basophils Absolute: 0 10*3/uL (ref 0.0–0.1)
Basophils Relative: 0 %
Eosinophils Absolute: 0.1 10*3/uL (ref 0.0–0.5)
Eosinophils Relative: 1 %
HCT: 39 % (ref 36.0–46.0)
Hemoglobin: 12.6 g/dL (ref 12.0–15.0)
Immature Granulocytes: 2 %
Lymphocytes Relative: 14 %
Lymphs Abs: 1.7 10*3/uL (ref 0.7–4.0)
MCH: 30.4 pg (ref 26.0–34.0)
MCHC: 32.3 g/dL (ref 30.0–36.0)
MCV: 94.2 fL (ref 80.0–100.0)
Monocytes Absolute: 0.4 10*3/uL (ref 0.1–1.0)
Monocytes Relative: 3 %
Neutro Abs: 9.3 10*3/uL — ABNORMAL HIGH (ref 1.7–7.7)
Neutrophils Relative %: 80 %
Platelets: 176 10*3/uL (ref 150–400)
RBC: 4.14 MIL/uL (ref 3.87–5.11)
RDW: 13.5 % (ref 11.5–15.5)
WBC: 11.6 10*3/uL — ABNORMAL HIGH (ref 4.0–10.5)
nRBC: 0 % (ref 0.0–0.2)

## 2019-02-01 LAB — FOLATE: Folate: 9.6 ng/mL (ref >5.9)

## 2019-02-01 LAB — VITAMIN B12: Vitamin B-12: 85 pg/mL — ABNORMAL LOW (ref 180–914)

## 2019-02-02 DIAGNOSIS — O30043 Twin pregnancy, dichorionic/diamniotic, third trimester: Secondary | ICD-10-CM | POA: Diagnosis not present

## 2019-02-02 DIAGNOSIS — Z3A34 34 weeks gestation of pregnancy: Secondary | ICD-10-CM | POA: Diagnosis not present

## 2019-02-02 DIAGNOSIS — Z3685 Encounter for antenatal screening for Streptococcus B: Secondary | ICD-10-CM | POA: Diagnosis not present

## 2019-02-03 ENCOUNTER — Telehealth (HOSPITAL_COMMUNITY): Payer: Self-pay | Admitting: *Deleted

## 2019-02-03 ENCOUNTER — Encounter (HOSPITAL_COMMUNITY): Payer: Self-pay | Admitting: *Deleted

## 2019-02-03 NOTE — Telephone Encounter (Signed)
Preadmission screen  

## 2019-02-06 ENCOUNTER — Encounter (HOSPITAL_COMMUNITY): Payer: Self-pay

## 2019-02-06 NOTE — Patient Instructions (Signed)
Alejandra Barr  02/06/2019   Your procedure is scheduled on:  02/15/2019  Arrive at 1000 at Entrance C on [address] at [facility]  and [employer]. You are invited to use the FREE valet parking or use the Visitor's parking deck.  Pick up the phone at the desk and dial [phone].  Call this number if you have problems the morning of surgery: [phone]  Remember:   Do not eat food:(After Midnight) Desps de medianoche.  Do not drink clear liquids: (6 Hours before arrival) 6 horas ante llegada.  Take these medicines the morning of surgery with A SIP OF WATER:  Take your toprol and the appropriate dose of synthroid for that day   Do not wear jewelry, make-up or nail polish.  Do not wear lotions, powders, or perfumes. Do not wear deodorant.  Do not shave 48 hours prior to surgery.  Do not bring valuables to the hospital.  [facility] is not   responsible for any belongings or valuables brought to the hospital.  Contacts, dentures or bridgework may not be worn into surgery.  Leave suitcase in the car. After surgery it may be brought to your room.  For patients admitted to the hospital, checkout time is 11:00 AM the day of              discharge.      Please read over the following fact sheets that you were given:     Preparing for Surgery

## 2019-02-07 ENCOUNTER — Other Ambulatory Visit: Payer: Self-pay

## 2019-02-07 ENCOUNTER — Inpatient Hospital Stay (HOSPITAL_BASED_OUTPATIENT_CLINIC_OR_DEPARTMENT_OTHER): Payer: BLUE CROSS/BLUE SHIELD | Admitting: Nurse Practitioner

## 2019-02-07 DIAGNOSIS — E538 Deficiency of other specified B group vitamins: Secondary | ICD-10-CM | POA: Diagnosis not present

## 2019-02-07 DIAGNOSIS — D508 Other iron deficiency anemias: Secondary | ICD-10-CM

## 2019-02-07 DIAGNOSIS — Z3A34 34 weeks gestation of pregnancy: Secondary | ICD-10-CM | POA: Diagnosis not present

## 2019-02-07 DIAGNOSIS — O30043 Twin pregnancy, dichorionic/diamniotic, third trimester: Secondary | ICD-10-CM | POA: Diagnosis not present

## 2019-02-07 NOTE — Progress Notes (Signed)
Virtual Visit via Telephone Note  I connected with Alejandra Barr on 02/07/19 at  9:55 AM EDT by telephone and verified that I am speaking with the correct person using two identifiers.   I discussed the limitations, risks, security and privacy concerns of performing an evaluation and management service by telephone and the availability of in person appointments. I also discussed with the patient that there may be a patient responsible charge related to this service. The patient expressed understanding and agreed to proceed.   History of Present Illness: - Alejandra Barr is doing well today.  We had a phone appointment and she had no complaints besides her normal fatigue.  She reports her C-section is scheduled for 15 April.  She reports she has been 3 cm dilated for a few weeks now.  She would like her second iron infusion a couple weeks after her delivery.  She reports last time she dropped after delivery and the iron helped. Denies any nausea, vomiting, or diarrhea. Denies any new pains. Had not noticed any recent bleeding such as epistaxis, hematuria or hematochezia. Denies recent chest pain on exertion, shortness of breath on minimal exertion, pre-syncopal episodes, or palpitations. Denies any numbness or tingling in hands or feet. Denies any recent fevers, infections, or recent hospitalizations. Patient reports appetite at 100% and energy level at 50%.  She is eating well at this time.    Observations/Objective: -She is alert and oriented. - Physical assessment deferred due to telephone appointment  Assessment and Plan: 1.  Iron deficiency anemia: - It is due to malabsorption from gastric bypass surgery in 2006 and blood loss from menstruation. - She was given 1 infusion of Feraheme on 12/29/2018.  She only felt a mild improvement in her fatigue. - She is currently 34 weeks and 5 days today.  She is scheduled for a C-section on April 15. - Labs from 02/01/2019 was hemoglobin 12.6.  Ferritin 65.   Percent saturation 26. - We will set her up for 1 infusion of Feraheme 2 weeks after her delivery date.  2.  B12 deficiency: - She is currently taking B12 injections at home every 2 weeks. - Last visit she was taking the injections every 3 weeks and her B12 was 155 so we increased the frequency to every 2 weeks. - Labs on 02/01/2019 showed her B12 level was 85. - She will take an extra B12 injection on her off week. - We will see her back in 6 weeks and recheck her levels.    Follow Up Instructions: -We will set her up with 1 iron infusion 2 weeks after her delivery. - She will follow-up with [place] 6 weeks after her iron infusion with labs prior.   I discussed the assessment and treatment plan with the patient. The patient was provided an opportunity to ask questions and all were answered. The patient agreed with the plan and demonstrated an understanding of the instructions.   The patient was advised to call back or seek an in-person evaluation if the symptoms worsen or if the condition fails to improve as anticipated.  I provided 20 minutes of non-face-to-face time during this encounter.   Glennie Isle, NP-C

## 2019-02-09 ENCOUNTER — Other Ambulatory Visit: Payer: Self-pay | Admitting: Obstetrics and Gynecology

## 2019-02-10 ENCOUNTER — Inpatient Hospital Stay (HOSPITAL_COMMUNITY)
Admission: AD | Admit: 2019-02-10 | Discharge: 2019-02-13 | DRG: 787 | Disposition: A | Discharge status: Home / Self Care | Payer: BLUE CROSS/BLUE SHIELD | Attending: Obstetrics and Gynecology | Admitting: Obstetrics and Gynecology

## 2019-02-10 ENCOUNTER — Encounter (HOSPITAL_COMMUNITY): Payer: Self-pay | Admitting: Emergency Medicine

## 2019-02-10 ENCOUNTER — Other Ambulatory Visit: Payer: Self-pay

## 2019-02-10 ENCOUNTER — Inpatient Hospital Stay (HOSPITAL_COMMUNITY): Payer: BLUE CROSS/BLUE SHIELD | Admitting: Certified Registered Nurse Anesthetist

## 2019-02-10 ENCOUNTER — Encounter (HOSPITAL_COMMUNITY)
Admission: AD | Disposition: A | Discharge status: Home / Self Care | Payer: Self-pay | Source: Home / Self Care | Attending: Obstetrics and Gynecology

## 2019-02-10 DIAGNOSIS — O30043 Twin pregnancy, dichorionic/diamniotic, third trimester: Secondary | ICD-10-CM | POA: Diagnosis present

## 2019-02-10 DIAGNOSIS — Z3A35 35 weeks gestation of pregnancy: Secondary | ICD-10-CM

## 2019-02-10 DIAGNOSIS — O34211 Maternal care for low transverse scar from previous cesarean delivery: Principal | ICD-10-CM | POA: Diagnosis present

## 2019-02-10 DIAGNOSIS — O42913 Preterm premature rupture of membranes, unspecified as to length of time between rupture and onset of labor, third trimester: Secondary | ICD-10-CM | POA: Diagnosis not present

## 2019-02-10 DIAGNOSIS — O365932 Maternal care for other known or suspected poor fetal growth, third trimester, fetus 2: Secondary | ICD-10-CM | POA: Diagnosis present

## 2019-02-10 DIAGNOSIS — O321XX2 Maternal care for breech presentation, fetus 2: Secondary | ICD-10-CM | POA: Diagnosis present

## 2019-02-10 DIAGNOSIS — O9081 Anemia of the puerperium: Secondary | ICD-10-CM | POA: Diagnosis not present

## 2019-02-10 DIAGNOSIS — D62 Acute posthemorrhagic anemia: Secondary | ICD-10-CM | POA: Diagnosis not present

## 2019-02-10 DIAGNOSIS — R0603 Acute respiratory distress: Secondary | ICD-10-CM | POA: Diagnosis not present

## 2019-02-10 DIAGNOSIS — O30049 Twin pregnancy, dichorionic/diamniotic, unspecified trimester: Secondary | ICD-10-CM | POA: Diagnosis present

## 2019-02-10 DIAGNOSIS — O99844 Bariatric surgery status complicating childbirth: Secondary | ICD-10-CM | POA: Diagnosis not present

## 2019-02-10 DIAGNOSIS — O34219 Maternal care for unspecified type scar from previous cesarean delivery: Secondary | ICD-10-CM | POA: Diagnosis not present

## 2019-02-10 DIAGNOSIS — Z23 Encounter for immunization: Secondary | ICD-10-CM | POA: Diagnosis not present

## 2019-02-10 DIAGNOSIS — O99214 Obesity complicating childbirth: Secondary | ICD-10-CM | POA: Diagnosis present

## 2019-02-10 DIAGNOSIS — O321XX0 Maternal care for breech presentation, not applicable or unspecified: Secondary | ICD-10-CM | POA: Diagnosis present

## 2019-02-10 DIAGNOSIS — Z3A00 Weeks of gestation of pregnancy not specified: Secondary | ICD-10-CM | POA: Diagnosis not present

## 2019-02-10 DIAGNOSIS — Z412 Encounter for routine and ritual male circumcision: Secondary | ICD-10-CM | POA: Diagnosis not present

## 2019-02-10 DIAGNOSIS — Z87891 Personal history of nicotine dependence: Secondary | ICD-10-CM

## 2019-02-10 DIAGNOSIS — Z98891 History of uterine scar from previous surgery: Secondary | ICD-10-CM

## 2019-02-10 DIAGNOSIS — O43899 Other placental disorders, unspecified trimester: Secondary | ICD-10-CM | POA: Diagnosis not present

## 2019-02-10 LAB — COMPREHENSIVE METABOLIC PANEL
ALT: 7 U/L (ref 0–44)
AST: 16 U/L (ref 15–41)
Albumin: 2.5 g/dL — ABNORMAL LOW (ref 3.5–5.0)
Alkaline Phosphatase: 183 U/L — ABNORMAL HIGH (ref 38–126)
Anion gap: 11 (ref 5–15)
BUN: 6 mg/dL (ref 6–20)
CO2: 18 mmol/L — ABNORMAL LOW (ref 22–32)
Calcium: 8.8 mg/dL — ABNORMAL LOW (ref 8.9–10.3)
Chloride: 107 mmol/L (ref 98–111)
Creatinine, Ser: 0.74 mg/dL (ref 0.44–1.00)
GFR calc Af Amer: >60 mL/min (ref >60)
GFR calc non Af Amer: >60 mL/min (ref >60)
Glucose, Bld: 82 mg/dL (ref 70–99)
Potassium: 4.6 mmol/L (ref 3.5–5.1)
Sodium: 136 mmol/L (ref 135–145)
Total Bilirubin: 0.2 mg/dL — ABNORMAL LOW (ref 0.3–1.2)
Total Protein: 5 g/dL — ABNORMAL LOW (ref 6.5–8.1)

## 2019-02-10 LAB — TYPE AND SCREEN
ABO/RH(D): O POS
Antibody Screen: NEGATIVE

## 2019-02-10 LAB — CBC
HCT: 38.6 % (ref 36.0–46.0)
Hemoglobin: 12.9 g/dL (ref 12.0–15.0)
MCH: 31.1 pg (ref 26.0–34.0)
MCHC: 33.4 g/dL (ref 30.0–36.0)
MCV: 93 fL (ref 80.0–100.0)
Platelets: 168 10*3/uL (ref 150–400)
RBC: 4.15 MIL/uL (ref 3.87–5.11)
RDW: 13.6 % (ref 11.5–15.5)
WBC: 9.3 10*3/uL (ref 4.0–10.5)
nRBC: 0 % (ref 0.0–0.2)

## 2019-02-10 LAB — POCT FERN TEST: POCT Fern Test: POSITIVE

## 2019-02-10 SURGERY — Surgical Case
Anesthesia: Spinal | Wound class: Clean Contaminated

## 2019-02-10 MED ORDER — SENNOSIDES-DOCUSATE SODIUM 8.6-50 MG PO TABS
2.0000 | ORAL_TABLET | ORAL | Status: DC
  Administered 2019-02-11 – 2019-02-12 (×3): 2 via ORAL
  Filled 2019-02-10 (×3): qty 2

## 2019-02-10 MED ORDER — CEFAZOLIN SODIUM-DEXTROSE 2-4 GM/100ML-% IV SOLN: 2.0000 g | INTRAVENOUS | Status: DC

## 2019-02-10 MED ORDER — IBUPROFEN 800 MG PO TABS
800.0000 mg | ORAL_TABLET | Freq: Four times a day (QID) | ORAL | Status: DC
  Administered 2019-02-11 – 2019-02-13 (×9): 800 mg via ORAL
  Filled 2019-02-10 (×9): qty 1

## 2019-02-10 MED ORDER — METOPROLOL SUCCINATE ER 25 MG PO TB24
25.0000 mg | ORAL_TABLET | Freq: Every day | ORAL | Status: DC
  Administered 2019-02-11 – 2019-02-13 (×3): 25 mg via ORAL
  Filled 2019-02-10 (×4): qty 1

## 2019-02-10 MED ORDER — FERROUS SULFATE 300 (60 FE) MG/5ML PO SYRP
300.0000 mg | ORAL_SOLUTION | Freq: Two times a day (BID) | ORAL | Status: DC
  Administered 2019-02-10 – 2019-02-13 (×5): 300 mg via ORAL
  Filled 2019-02-10 (×8): qty 5

## 2019-02-10 MED ORDER — FENTANYL CITRATE (PF) 100 MCG/2ML IJ SOLN
INTRAMUSCULAR | Status: DC | PRN
  Administered 2019-02-10: 15 ug via INTRATHECAL

## 2019-02-10 MED ORDER — LACTATED RINGERS IV BOLUS: 1000.0000 mL | Freq: Once | INTRAVENOUS | Status: DC

## 2019-02-10 MED ORDER — MORPHINE SULFATE (PF) 0.5 MG/ML IJ SOLN
INTRAMUSCULAR | Status: AC
  Filled 2019-02-10: qty 10

## 2019-02-10 MED ORDER — HYDROMORPHONE HCL 2 MG PO TABS
2.0000 mg | ORAL_TABLET | ORAL | Status: DC | PRN
  Administered 2019-02-10 – 2019-02-13 (×10): 2 mg via ORAL
  Filled 2019-02-10 (×10): qty 1

## 2019-02-10 MED ORDER — CLINDAMYCIN PHOSPHATE 900 MG/50ML IV SOLN
INTRAVENOUS | Status: DC | PRN
  Administered 2019-02-10: 900 mg via INTRAVENOUS

## 2019-02-10 MED ORDER — PHENYLEPHRINE 40 MCG/ML (10ML) SYRINGE FOR IV PUSH (FOR BLOOD PRESSURE SUPPORT)
PREFILLED_SYRINGE | INTRAVENOUS | Status: AC
  Filled 2019-02-10: qty 10

## 2019-02-10 MED ORDER — TETANUS-DIPHTH-ACELL PERTUSSIS 5-2.5-18.5 LF-MCG/0.5 IM SUSP: 0.5000 mL | Freq: Once | INTRAMUSCULAR | Status: DC

## 2019-02-10 MED ORDER — DEXAMETHASONE SODIUM PHOSPHATE 4 MG/ML IJ SOLN
INTRAMUSCULAR | Status: DC | PRN
  Administered 2019-02-10: 4 mg via INTRAVENOUS

## 2019-02-10 MED ORDER — LEVOTHYROXINE SODIUM 100 MCG PO TABS
100.0000 ug | ORAL_TABLET | ORAL | Status: DC
  Administered 2019-02-11: 100 ug via ORAL
  Filled 2019-02-10: qty 1

## 2019-02-10 MED ORDER — LACTATED RINGERS IV SOLN
INTRAVENOUS | Status: DC
  Administered 2019-02-10 (×2): via INTRAVENOUS

## 2019-02-10 MED ORDER — GENTAMICIN SULFATE 40 MG/ML IJ SOLN
5.0000 mg/kg | Freq: Once | INTRAVENOUS | Status: AC
  Administered 2019-02-10: 361.2 mg via INTRAVENOUS
  Filled 2019-02-10: qty 9

## 2019-02-10 MED ORDER — SIMETHICONE 80 MG PO CHEW
80.0000 mg | CHEWABLE_TABLET | Freq: Three times a day (TID) | ORAL | Status: DC
  Administered 2019-02-11 – 2019-02-13 (×6): 80 mg via ORAL
  Filled 2019-02-10 (×6): qty 1

## 2019-02-10 MED ORDER — SOD CITRATE-CITRIC ACID 500-334 MG/5ML PO SOLN
ORAL | Status: AC
  Filled 2019-02-10: qty 30

## 2019-02-10 MED ORDER — PRENATAL MULTIVITAMIN CH
1.0000 | ORAL_TABLET | Freq: Every day | ORAL | Status: DC
  Administered 2019-02-11 – 2019-02-13 (×3): 1 via ORAL
  Filled 2019-02-10 (×3): qty 1

## 2019-02-10 MED ORDER — KETOROLAC TROMETHAMINE 30 MG/ML IJ SOLN
30.0000 mg | Freq: Four times a day (QID) | INTRAMUSCULAR | Status: AC
  Administered 2019-02-10 – 2019-02-11 (×4): 30 mg via INTRAVENOUS
  Filled 2019-02-10 (×4): qty 1

## 2019-02-10 MED ORDER — METOPROLOL SUCCINATE 12.5 MG HALF TABLET
12.5000 mg | ORAL_TABLET | Freq: Every day | ORAL | Status: DC
  Administered 2019-02-11 – 2019-02-12 (×2): 12.5 mg via ORAL
  Filled 2019-02-10 (×4): qty 1

## 2019-02-10 MED ORDER — BUPIVACAINE HCL (PF) 0.25 % IJ SOLN
INTRAMUSCULAR | Status: AC
  Filled 2019-02-10: qty 20

## 2019-02-10 MED ORDER — OXYTOCIN 40 UNITS IN NORMAL SALINE INFUSION - SIMPLE MED
INTRAVENOUS | Status: DC | PRN
  Administered 2019-02-10: 100 mL via INTRAVENOUS
  Administered 2019-02-10: 400 mL via INTRAVENOUS

## 2019-02-10 MED ORDER — TRANEXAMIC ACID-NACL 1000-0.7 MG/100ML-% IV SOLN
INTRAVENOUS | Status: AC
  Filled 2019-02-10: qty 100

## 2019-02-10 MED ORDER — CLINDAMYCIN PHOSPHATE 900 MG/50ML IV SOLN
INTRAVENOUS | Status: AC
  Filled 2019-02-10: qty 50

## 2019-02-10 MED ORDER — BUPIVACAINE HCL (PF) 0.25 % IJ SOLN: INTRAMUSCULAR | Status: DC | PRN

## 2019-02-10 MED ORDER — PHENYLEPHRINE HCL-NACL 20-0.9 MG/250ML-% IV SOLN
INTRAVENOUS | Status: DC | PRN
  Administered 2019-02-10: 60 ug/min via INTRAVENOUS

## 2019-02-10 MED ORDER — WITCH HAZEL-GLYCERIN EX PADS: 1.0000 "application " | MEDICATED_PAD | CUTANEOUS | Status: DC | PRN

## 2019-02-10 MED ORDER — SIMETHICONE 80 MG PO CHEW
80.0000 mg | CHEWABLE_TABLET | ORAL | Status: DC
  Administered 2019-02-12 (×2): 80 mg via ORAL
  Filled 2019-02-10 (×2): qty 1

## 2019-02-10 MED ORDER — FOLIC ACID 800 MCG PO TABS: 800.0000 ug | ORAL_TABLET | Freq: Every day | ORAL | Status: DC

## 2019-02-10 MED ORDER — ZOLPIDEM TARTRATE 5 MG PO TABS: 5.0000 mg | ORAL_TABLET | Freq: Every evening | ORAL | Status: DC | PRN

## 2019-02-10 MED ORDER — PHENYLEPHRINE 40 MCG/ML (10ML) SYRINGE FOR IV PUSH (FOR BLOOD PRESSURE SUPPORT)
80.0000 ug | PREFILLED_SYRINGE | INTRAVENOUS | Status: DC | PRN
  Filled 2019-02-10 (×2): qty 10

## 2019-02-10 MED ORDER — METOPROLOL SUCCINATE ER 25 MG PO TB24: 37.5000 mg | ORAL_TABLET | Freq: Every day | ORAL | Status: DC

## 2019-02-10 MED ORDER — PHENYLEPHRINE HCL (PRESSORS) 10 MG/ML IV SOLN
INTRAVENOUS | Status: DC | PRN
  Administered 2019-02-10 (×2): 80 ug via INTRAVENOUS

## 2019-02-10 MED ORDER — DEXAMETHASONE SODIUM PHOSPHATE 4 MG/ML IJ SOLN
INTRAMUSCULAR | Status: AC
  Filled 2019-02-10: qty 1

## 2019-02-10 MED ORDER — METHYLERGONOVINE MALEATE 0.2 MG/ML IJ SOLN: 0.2000 mg | INTRAMUSCULAR | Status: DC | PRN

## 2019-02-10 MED ORDER — OXYTOCIN 40 UNITS IN NORMAL SALINE INFUSION - SIMPLE MED: 2.5000 [IU]/h | INTRAVENOUS | Status: DC

## 2019-02-10 MED ORDER — DIPHENHYDRAMINE HCL 25 MG PO CAPS: 25.0000 mg | ORAL_CAPSULE | Freq: Four times a day (QID) | ORAL | Status: DC | PRN

## 2019-02-10 MED ORDER — SIMETHICONE 80 MG PO CHEW
80.0000 mg | CHEWABLE_TABLET | ORAL | Status: DC | PRN
  Administered 2019-02-10: 80 mg via ORAL
  Filled 2019-02-10: qty 1

## 2019-02-10 MED ORDER — BUPIVACAINE HCL (PF) 0.75 % IJ SOLN
INTRAMUSCULAR | Status: DC | PRN
  Administered 2019-02-10: 2 mL via INTRATHECAL

## 2019-02-10 MED ORDER — SOD CITRATE-CITRIC ACID 500-334 MG/5ML PO SOLN
30.0000 mL | Freq: Once | ORAL | Status: AC
  Administered 2019-02-10: 30 mL via ORAL

## 2019-02-10 MED ORDER — ONDANSETRON HCL 4 MG/2ML IJ SOLN
INTRAMUSCULAR | Status: DC | PRN
  Administered 2019-02-10: 4 mg via INTRAVENOUS

## 2019-02-10 MED ORDER — OXYTOCIN 40 UNITS IN NORMAL SALINE INFUSION - SIMPLE MED
INTRAVENOUS | Status: AC
  Filled 2019-02-10: qty 1000

## 2019-02-10 MED ORDER — COCONUT OIL OIL: 1.0000 "application " | TOPICAL_OIL | Status: DC | PRN

## 2019-02-10 MED ORDER — LEVOTHYROXINE SODIUM 50 MCG PO TABS: 50.0000 ug | ORAL_TABLET | ORAL | Status: DC

## 2019-02-10 MED ORDER — GENTAMICIN SULFATE 40 MG/ML IJ SOLN
5.0000 mg/kg | INTRAVENOUS | Status: DC
  Filled 2019-02-10: qty 9

## 2019-02-10 MED ORDER — FAMOTIDINE 20 MG IN NS 100 ML IVPB
20.0000 mg | Freq: Once | INTRAVENOUS | Status: DC
  Filled 2019-02-10: qty 100

## 2019-02-10 MED ORDER — FOLIC ACID 1 MG PO TABS
1.0000 mg | ORAL_TABLET | Freq: Every day | ORAL | Status: DC
  Administered 2019-02-10 – 2019-02-13 (×4): 1 mg via ORAL
  Filled 2019-02-10 (×5): qty 1

## 2019-02-10 MED ORDER — DIBUCAINE (PERIANAL) 1 % EX OINT: 1.0000 "application " | TOPICAL_OINTMENT | CUTANEOUS | Status: DC | PRN

## 2019-02-10 MED ORDER — METHYLERGONOVINE MALEATE 0.2 MG PO TABS: 0.2000 mg | ORAL_TABLET | ORAL | Status: DC | PRN

## 2019-02-10 MED ORDER — LACTATED RINGERS IV SOLN
INTRAVENOUS | Status: DC
  Administered 2019-02-10: 10:00:00 via INTRAVENOUS

## 2019-02-10 MED ORDER — ONDANSETRON HCL 4 MG/2ML IJ SOLN
INTRAMUSCULAR | Status: AC
  Filled 2019-02-10: qty 2

## 2019-02-10 MED ORDER — MENTHOL 3 MG MT LOZG: 1.0000 | LOZENGE | OROMUCOSAL | Status: DC | PRN

## 2019-02-10 MED ORDER — FENTANYL CITRATE (PF) 100 MCG/2ML IJ SOLN
INTRAMUSCULAR | Status: AC
  Filled 2019-02-10: qty 2

## 2019-02-10 MED ORDER — LEVOTHYROXINE SODIUM 50 MCG PO TABS
50.0000 ug | ORAL_TABLET | ORAL | Status: DC
  Administered 2019-02-12 – 2019-02-13 (×2): 50 ug via ORAL
  Filled 2019-02-10 (×2): qty 1

## 2019-02-10 MED ORDER — TRANEXAMIC ACID 1000 MG/10ML IV SOLN
INTRAVENOUS | Status: DC | PRN
  Administered 2019-02-10: 1000 mg via INTRAVENOUS

## 2019-02-10 SURGICAL SUPPLY — 38 items
BENZOIN TINCTURE PRP APPL 2/3 (GAUZE/BANDAGES/DRESSINGS) ×1 IMPLANT
CHLORAPREP W/TINT 26ML (MISCELLANEOUS) ×2 IMPLANT
CLAMP CORD UMBIL (MISCELLANEOUS) IMPLANT
CLOTH BEACON ORANGE TIMEOUT ST (SAFETY) ×2 IMPLANT
DRSG OPSITE POSTOP 4X10 (GAUZE/BANDAGES/DRESSINGS) ×2 IMPLANT
ELECT REM PT RETURN 9FT ADLT (ELECTROSURGICAL) ×2
ELECTRODE REM PT RTRN 9FT ADLT (ELECTROSURGICAL) ×1 IMPLANT
EXTRACTOR VACUUM M CUP 4 TUBE (SUCTIONS) IMPLANT
GLOVE BIO SURGEON STRL SZ7.5 (GLOVE) ×2 IMPLANT
GLOVE BIOGEL PI IND STRL 7.0 (GLOVE) ×1 IMPLANT
GLOVE BIOGEL PI INDICATOR 7.0 (GLOVE) ×1
GOWN STRL REUS W/TWL LRG LVL3 (GOWN DISPOSABLE) ×4 IMPLANT
KIT ABG SYR 3ML LUER SLIP (SYRINGE) IMPLANT
NDL HYPO 25X5/8 SAFETYGLIDE (NEEDLE) IMPLANT
NDL SPNL 20GX3.5 QUINCKE YW (NEEDLE) IMPLANT
NEEDLE HYPO 22GX1.5 SAFETY (NEEDLE) ×2 IMPLANT
NEEDLE HYPO 25X5/8 SAFETYGLIDE (NEEDLE) IMPLANT
NEEDLE SPNL 20GX3.5 QUINCKE YW (NEEDLE) IMPLANT
NS IRRIG 1000ML POUR BTL (IV SOLUTION) ×2 IMPLANT
PACK C SECTION WH (CUSTOM PROCEDURE TRAY) ×2 IMPLANT
PENCIL SMOKE EVAC W/HOLSTER (ELECTROSURGICAL) ×2 IMPLANT
RETRACTOR TRAXI PANNICULUS (MISCELLANEOUS) IMPLANT
STRIP CLOSURE SKIN 1/2X4 (GAUZE/BANDAGES/DRESSINGS) ×1 IMPLANT
SUT MNCRL 0 VIOLET CTX 36 (SUTURE) ×2 IMPLANT
SUT MNCRL AB 3-0 PS2 27 (SUTURE) IMPLANT
SUT MON AB 2-0 CT1 27 (SUTURE) ×2 IMPLANT
SUT MON AB-0 CT1 36 (SUTURE) ×4 IMPLANT
SUT MONOCRYL 0 CTX 36 (SUTURE) ×3
SUT PLAIN 0 NONE (SUTURE) IMPLANT
SUT PLAIN 2 0 (SUTURE)
SUT PLAIN 2 0 XLH (SUTURE) ×1 IMPLANT
SUT PLAIN ABS 2-0 CT1 27XMFL (SUTURE) IMPLANT
SYR 20CC LL (SYRINGE) IMPLANT
SYR CONTROL 10ML LL (SYRINGE) ×2 IMPLANT
TOWEL OR 17X24 6PK STRL BLUE (TOWEL DISPOSABLE) ×2 IMPLANT
TRAXI PANNICULUS RETRACTOR (MISCELLANEOUS) ×1
TRAY FOLEY W/BAG SLVR 14FR LF (SET/KITS/TRAYS/PACK) ×2 IMPLANT
WATER STERILE IRR 1000ML POUR (IV SOLUTION) ×2 IMPLANT

## 2019-02-10 NOTE — Transfer of Care (Signed)
Immediate Anesthesia Transfer of Care Note  Patient: Alejandra Barr  Procedure(s) Performed: Repeat CESAREAN SECTION MULTI-GESTATIONAL (N/A )  Patient Location: PACU  Anesthesia Type:Spinal  Level of Consciousness: awake, alert  and oriented  Airway & Oxygen Therapy: Patient Spontanous Breathing  Post-op Assessment: Report given to RN and Post -op Vital signs reviewed and stable  Post vital signs: Reviewed and stable  Last Vitals:  Vitals Value Taken Time  BP    Temp    Pulse 75 02/10/2019 11:24 AM  Resp 11 02/10/2019 11:24 AM  SpO2 98 % 02/10/2019 11:24 AM    Last Pain:  Vitals:   02/10/19 1124  TempSrc:   PainSc: (P) 0-No pain      Patients Stated Pain Goal: 0 (38/88/75 7972)  Complications: No apparent anesthesia complications

## 2019-02-10 NOTE — Op Note (Signed)
Cesarean Section Procedure Note  Indications: malpresentation: twin B and previous uterine incision kerr x one  Pre-operative Diagnosis: 35 week 2 day pregnancy.  Post-operative Diagnosis: same  Surgeon: Lovenia Kim   Assistants: Eddie Dibbles, CNM  Anesthesia: Local anesthesia 0.25.% bupivacaine and Spinal anesthesia  ASA Class: 2  Procedure Details  The patient was seen in the Holding Room. The risks, benefits, complications, treatment options, and expected outcomes were discussed with the patient.  The patient concurred with the proposed plan, giving informed consent. The risks of anesthesia, infection, bleeding and possible injury to other organs discussed. Injury to bowel, bladder, or ureter with possible need for repair discussed. Possible need for transfusion with secondary risks of hepatitis or HIV acquisition discussed. Post operative complications to include but not limited to DVT, PE and Pneumonia noted. The site of surgery properly noted/marked. The patient was taken to Operating Room # B, identified as Alejandra Barr and the procedure verified as C-Section Delivery. A Time Out was held and the above information confirmed.  After induction of anesthesia, the patient was draped and prepped in the usual sterile manner. A Pfannenstiel incision was made and carried down through the subcutaneous tissue to the fascia. Fascial incision was made and extended transversely using Mayo scissors. The fascia was separated from the underlying rectus tissue superiorly and inferiorly. The peritoneum was identified and entered. Peritoneal incision was extended longitudinally. The utero-vesical peritoneal reflection was incised transversely and the bladder flap was bluntly freed from the lower uterine segment. A low transverse uterine incision(Kerr hysterotomy) was made. Delivered from vertex presentation was a  Female twin A with Apgar scores of 8 at one minute and 9 at five minutes. Bulb suctioning gently  performed. Neonatal team in attendance.After the umbilical cord was clamped and cut cord blood was obtained for evaluation.   Delivered from breech converted to vtx presentation was a  Female twin B with Apgar scores of pending at one minute and pending at five minutes. Baby B to NICU.  Bulb suctioning gently performed. Neonatal team in attendance.After the umbilical cord was clamped and cut cord blood was obtained for evaluation. The placenta was removed intact and appeared normal. The uterus was curetted with a dry lap pack. Good hemostasis was noted.The uterine outline, tubes and ovaries appeared normal. The uterine incision was closed with running locked sutures of 0 Monocryl x 2 layers. Hemostasis was observed. Lavage was carried out until clear.The parietal peritoneum was closed with a running 2-0 Monocryl suture. The fascia was then reapproximated with running sutures of 0 Monocryl. The skin was reapproximated with 3-0 monocryl after [place] closure with 2-0 plain.  Instrument, sponge, and needle counts were correct prior the abdominal closure and at the conclusion of the case.   Findings: As noted Pt was complete on entry to OR with vertex at +2. Declined VD.  Estimated Blood Loss:  800         Drains: foley                 Specimens: placenta to path                 Complications:  None; patient tolerated the procedure well.         Disposition: PACU - hemodynamically stable.         Condition: stable  Attending Attestation: I performed the procedure.

## 2019-02-10 NOTE — Anesthesia Preprocedure Evaluation (Addendum)
Anesthesia Evaluation   Patient awake    Reviewed: Allergy & Precautions, Patient's Chart, lab work & pertinent test resultsPreop documentation limited or incomplete due to emergent nature of procedure.  History of Anesthesia Complications (+) PONV  Airway        Dental   Pulmonary asthma , former smoker,           Cardiovascular + dysrhythmias Atrial Fibrillation      Neuro/Psych PSYCHIATRIC DISORDERS Anxiety Depression    GI/Hepatic   Endo/Other  Morbid obesity  Renal/GU      Musculoskeletal   Abdominal   Peds  Hematology   Anesthesia Other Findings Repeat C/S, breech twins  Reproductive/Obstetrics (+) Pregnancy                            Anesthesia Physical Anesthesia Plan  ASA: III and emergent  Anesthesia Plan: Spinal   Post-op Pain Management:    Induction:   PONV Risk Score and Plan: Ondansetron and Treatment may vary due to age or medical condition  Airway Management Planned: Natural Airway  Additional Equipment: None  Intra-op Plan:   Post-operative Plan:   Informed Consent: I have reviewed the patients History and Physical, chart, labs and discussed the procedure including the risks, benefits and alternatives for the proposed anesthesia with the patient or authorized representative who has indicated his/her understanding and acceptance.     Only emergency history available  Plan Discussed with:   Anesthesia Plan Comments: (Very brief emergent consent for spinal)       Anesthesia Quick Evaluation

## 2019-02-10 NOTE — H&P (Signed)
Alejandra Barr is a 33 y.o. female presenting for SROM twin A. Previous csection for rpt.  OB History    Gravida  2   Para  1   Term  1   Preterm      AB      Living  1     SAB      TAB      Ectopic      Multiple  0   Live Births  1          Past Medical History:  Diagnosis Date  . Asthma   . Atrial flutter ([place])    Status post RFA by Alejandra Barr in 2004  . Chronic anxiety   . Chronic depression   . 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 Iron deficiency   . Iron deficiency anemia 10/02/2010   Qualifier: Diagnosis of  By: Alejandra Cipro MD, Alejandra Barr    . Low vitamin B12 level 07/10/2013   Overview:  Last Assessment & Plan:  Being treated through hematology  . Newborn product of in vitro fertilization (IVF) pregnancy   . Obesity   . PONV (postoperative nausea and vomiting)    Past Surgical History:  Procedure Laterality Date  . Bilateral eustachian tube placement on 4 different occasions    . CARDIAC ELECTROPHYSIOLOGY MAPPING AND ABLATION    . CESAREAN SECTION N/A 05/26/2017   Procedure: CESAREAN SECTION;  Surgeon: Alejandra Hedges, DO;  Location: [facility];  Service: Obstetrics;  Laterality: N/A;  . CHOLECYSTECTOMY  2003  . COLONOSCOPY N/A 11/29/2013   Procedure: COLONOSCOPY;  Surgeon: Alejandra [place], MD;  [facility];  Service: Endoscopy;  Laterality: N/A;  120-rescheduled to 300 Ann notified pt  . GASTRIC BYPASS  2006  . TONSILLECTOMY  1990   Family History: family history includes Arrhythmia in her mother; Breast cancer (age of onset: 82) in her mother; Diabetes in her maternal grandmother; Fibromyalgia in her mother; Heart defect in her mother; Heart disease in her paternal grandfather and paternal grandmother; Hypertension in her maternal grandfather, maternal grandmother, paternal grandfather, and paternal grandmother; Hypothyroidism in her mother; Obesity in her mother. Social History:  reports that she quit smoking about 3 years ago. Her smoking  use included cigarettes. She started smoking about 5 years ago. She smoked 0.50 packs per day. She has never used smokeless tobacco. She reports that she does not drink alcohol or use drugs.     Maternal Diabetes: No Genetic Screening: Normal Maternal Ultrasounds/Referrals: Abnormal:  Findings:   Other: IUGR twin B Fetal Ultrasounds or other Referrals:  Referred to Materal Fetal Medicine  Maternal Substance Abuse:  No Significant Maternal Medications:  None Significant Maternal Lab Results:  None Other Comments:  None  Review of Systems  Constitutional: Negative.   All other systems reviewed and are negative.  Maternal Medical History:  Reason for admission: Rupture of membranes.   Contractions: Onset was less than 1 hour ago.   Frequency: irregular.   Perceived severity is mild.    Fetal activity: Perceived fetal activity is normal.   Last perceived fetal movement was within the past hour.    Prenatal complications: IUGR.   Prenatal Complications - Diabetes: none.      unknown if currently breastfeeding. Maternal Exam:  Uterine Assessment: Contraction strength is mild.  Contraction frequency is irregular.   Abdomen: Patient reports no abdominal tenderness. Surgical scars: low transverse.   Fetal presentation: vertex  Introitus: Normal vulva. Normal vagina.  Ferning test: positive.  Nitrazine test: positive. Amniotic fluid character: not assessed.  Pelvis: questionable for delivery.   Cervix: Cervix evaluated by digital exam.     Physical Exam  Nursing note and vitals reviewed. Constitutional: She is oriented to person, place, and time. She appears well-developed and well-nourished.  Neck: Normal range of motion. Neck supple.  Cardiovascular: Normal rate and regular rhythm.  Respiratory: Effort normal and breath sounds normal.  GI: Soft. Bowel sounds are normal.  Genitourinary:    Vulva, vagina and uterus normal.   Musculoskeletal: Normal range of motion.   Neurological: She is alert and oriented to person, place, and time. She has normal reflexes.  Skin: Skin is warm and dry.  Psychiatric: She has a normal mood and affect.    Prenatal labs: ABO, Rh: O/Positive/-- (10/09 0000) Antibody: Negative (10/09 0000) Rubella: Immune (10/09 0000) RPR: Nonreactive (10/09 0000)  HBsAg: Negative (10/09 0000)  HIV: Non-reactive (10/09 0000)  GBS:     Assessment/Plan: 34 + wks  DIDI twins Malpresentation twin B SROM Rpt csection . Consent done.   Alejandra Barr J 02/10/2019, 9:55 AM

## 2019-02-10 NOTE — Anesthesia Postprocedure Evaluation (Signed)
Anesthesia Post Note  Patient: AINSLEE SOU  Procedure(s) Performed: Repeat CESAREAN SECTION MULTI-GESTATIONAL (N/A )     Patient location during evaluation: PACU Anesthesia Type: Spinal Level of consciousness: oriented and awake and alert Pain management: pain level controlled Vital Signs Assessment: post-procedure vital signs reviewed and stable Respiratory status: spontaneous breathing, respiratory function stable and nonlabored ventilation Cardiovascular status: blood pressure returned to baseline and stable Postop Assessment: no headache, no backache, no apparent nausea or vomiting and spinal receding Anesthetic complications: no    Last Vitals:  Vitals:   02/10/19 1230 02/10/19 1240  BP: 107/70   Pulse: 66   Resp: 11   Temp:  36.5 C  SpO2: 100%     Last Pain:  Vitals:   02/10/19 1240  TempSrc: Oral  PainSc:    Pain Goal: Patients Stated Pain Goal: 0 (02/10/19 1000)  LLE Motor Response: Purposeful movement (02/10/19 1230)   RLE Motor Response: Purposeful movement (02/10/19 1230)         Lidia Collum

## 2019-02-10 NOTE — Anesthesia Procedure Notes (Signed)
Spinal  Patient location during procedure: OR Staffing Anesthesiologist: Kaliyan Osbourn E, MD Performed: anesthesiologist  Preanesthetic Checklist Completed: patient identified, surgical consent, pre-op evaluation, timeout performed, IV checked, risks and benefits discussed and monitors and equipment checked Spinal Block Patient position: sitting Prep: site prepped and draped and DuraPrep Patient monitoring: continuous pulse ox, blood pressure and heart rate Approach: midline Location: L3-4 Injection technique: single-shot Needle Needle type: Pencan  Needle gauge: 24 G Needle length: 9 cm Additional Notes Functioning IV was confirmed and monitors were applied. Sterile prep and drape, including hand hygiene and sterile gloves were used. The patient was positioned and the spine was prepped. The skin was anesthetized with lidocaine.  Free flow of clear CSF was obtained prior to injecting local anesthetic into the CSF. The needle was carefully withdrawn. The patient tolerated the procedure well.      

## 2019-02-11 LAB — ABO/RH: ABO/RH(D): O POS

## 2019-02-11 LAB — COMPREHENSIVE METABOLIC PANEL
ALT: 7 U/L (ref 0–44)
AST: 16 U/L (ref 15–41)
Albumin: 1.9 g/dL — ABNORMAL LOW (ref 3.5–5.0)
Alkaline Phosphatase: 129 U/L — ABNORMAL HIGH (ref 38–126)
Anion gap: 9 (ref 5–15)
BUN: 8 mg/dL (ref 6–20)
CO2: 19 mmol/L — ABNORMAL LOW (ref 22–32)
Calcium: 8.5 mg/dL — ABNORMAL LOW (ref 8.9–10.3)
Chloride: 110 mmol/L (ref 98–111)
Creatinine, Ser: 0.78 mg/dL (ref 0.44–1.00)
GFR calc Af Amer: >60 mL/min (ref >60)
GFR calc non Af Amer: >60 mL/min (ref >60)
Glucose, Bld: 79 mg/dL (ref 70–99)
Potassium: 4.1 mmol/L (ref 3.5–5.1)
Sodium: 138 mmol/L (ref 135–145)
Total Bilirubin: 0.3 mg/dL (ref 0.3–1.2)
Total Protein: 4.3 g/dL — ABNORMAL LOW (ref 6.5–8.1)

## 2019-02-11 LAB — CBC
HCT: 28.4 % — ABNORMAL LOW (ref 36.0–46.0)
Hemoglobin: 9.6 g/dL — ABNORMAL LOW (ref 12.0–15.0)
MCH: 31 pg (ref 26.0–34.0)
MCHC: 33.8 g/dL (ref 30.0–36.0)
MCV: 91.6 fL (ref 80.0–100.0)
Platelets: 134 10*3/uL — ABNORMAL LOW (ref 150–400)
RBC: 3.1 MIL/uL — ABNORMAL LOW (ref 3.87–5.11)
RDW: 13.8 % (ref 11.5–15.5)
WBC: 11 10*3/uL — ABNORMAL HIGH (ref 4.0–10.5)
nRBC: 0 % (ref 0.0–0.2)

## 2019-02-11 LAB — RPR: RPR Ser Ql: NONREACTIVE

## 2019-02-11 MED ORDER — MAGNESIUM OXIDE 400 (241.3 MG) MG PO TABS
400.0000 mg | ORAL_TABLET | Freq: Every day | ORAL | Status: DC
  Administered 2019-02-11 – 2019-02-13 (×3): 400 mg via ORAL
  Filled 2019-02-11 (×3): qty 1

## 2019-02-11 MED ORDER — SODIUM CHLORIDE 0.9 % IV SOLN
510.0000 mg | Freq: Once | INTRAVENOUS | Status: AC
  Administered 2019-02-11: 510 mg via INTRAVENOUS
  Filled 2019-02-11: qty 17

## 2019-02-11 NOTE — Progress Notes (Signed)
POSTOPERATIVE DAY # 1 S/P CS - twins with malpresentation  S:         Reports feeling ok with some soreness but otherwise feels well             States Dr Ronita Hipps wanted dose IV iron in patient (scheduled outpatient tx on 29th)             Tolerating po intake / no nausea / no vomiting / no flatus / no BM             Bleeding is light             Pain controlled with Motrin this am with Dilaudid PRN             Up ad lib / ambulatory / voiding QS  Newborns Bottle and Breast   O:  VS: BP 121/69 (BP Location: Right Arm)   Pulse 84   Temp 98.1 F (36.7 C) (Oral)   Resp 18   SpO2 99%   Breastfeeding Unknown    LABS:              Recent Labs    02/10/19 0952 02/11/19 0511  WBC 9.3 11.0*  HGB 12.9 9.6*  PLT 168 134*               Bloodtype: --/--/O POS, O POS Performed at [facility], [place] [address], [place], [place] [address]  [phone])  Rubella: Immune (10/09 0000)                                            I&O: Intake/Output      04/10 0701 - 04/11 0700 04/11 0701 - 04/12 0700   I.V. 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            Obesity  P:        Routine postoperative care              IV Feraheme this PM (IV access removed this am prior to order - will need IV access only for dose)                           -oral iron syrup - add magnesium oxide for constipation prevention                     -  reduce outpatient visit post-op (NB twins and spouse is immunocompromised)             Change dressing today (hx wound infection with overhanging panus)                           -prevention strategies to prevent wound infection reviewed                            - elevate panus to keep surgery site clean and dry             Ambulate PRN / SCD post-op while in bed   Artelia Laroche CNM, MSN, [facility] 02/11/2019, 8:22 AM

## 2019-02-11 NOTE — Progress Notes (Signed)
Saline lock started for IV iron in R hand by Louanne Skye RN

## 2019-02-12 NOTE — Progress Notes (Signed)
POD #2 S/p repeat C/S  S: some burning on right side of the incision tol reg diet BF . Had problem with BRF last child  O: BP 126/75 (BP Location: Left Arm)   Pulse 84   Temp 98.1 F (36.7 C) (Oral)   Resp 16   SpO2 98%   Breastfeeding Unknown  Breast soft nontender abd obese soft  uterus 1 FB above umb surgical tender Primary dressing d/c/i Pad mod lochis( brown)  extr +1 edema  CBC Latest Ref Rng & Units 02/11/2019 02/10/2019 02/01/2019  WBC 4.0 - 10.5 K/uL 11.0(H) 9.3 11.6(H)  Hemoglobin 12.0 - 15.0 g/dL 9.6(L) 12.9 12.6  Hematocrit 36.0 - 46.0 % 28.4(L) 38.6 39.0  Platelets 150 - 400 K/uL 134(L) 168 176  IMP: POD #2 s/p rpt C/S Anemia. IV iron P) cont inpt care. Anticipate d/c home in am

## 2019-02-13 MED ORDER — HYDROMORPHONE HCL 2 MG PO TABS: 2.0000 mg | ORAL_TABLET | ORAL | 0 refills | Status: DC | PRN

## 2019-02-13 MED ORDER — SENNOSIDES-DOCUSATE SODIUM 8.6-50 MG PO TABS: 2.0000 | ORAL_TABLET | ORAL | Status: DC

## 2019-02-13 MED ORDER — MAGNESIUM OXIDE 400 (241.3 MG) MG PO TABS: 400.0000 mg | ORAL_TABLET | Freq: Every day | ORAL | Status: DC

## 2019-02-13 MED ORDER — IBUPROFEN 800 MG PO TABS: 800.0000 mg | ORAL_TABLET | Freq: Four times a day (QID) | ORAL | 0 refills | Status: DC

## 2019-02-13 NOTE — Discharge Summary (Signed)
OB Discharge Summary  Patient Name: Alejandra Barr DOB: 1986/06/07 MRN: [id number]  Date of admission: 02/10/2019 Delivering provider:    Adaeze, Better Daylyn [[id number]]  Evalena, Fujii Caydence [[id number]]  Brien Few   Date of discharge: 02/13/2019  Admitting diagnosis: 35wks ruptured Intrauterine pregnancy: [redacted]w[redacted]d    Secondary diagnosis:Principal Problem:   Postpartum care following cesarean delivery 02/10/2019 Active Problems:   Morbid [place] ([place]   S/P cesarean section - Twins, PROM 35 wks   Dichorionic diamniotic twin pregnancy   Breech birth  Additional problems:history of gastric bypass      Discharge diagnosis:  Patient Active Problem List   Diagnosis Date Noted  . Dichorionic diamniotic twin pregnancy 02/10/2019  . Postpartum care following cesarean delivery 02/10/2019 02/10/2019  . Breech birth 02/10/2019  . S/P cesarean section - Twins, PROM 35 wks 05/26/2017  . Migraine headache with aura 06/05/2014  . Vitamin D deficiency 08/02/2012  . Iron deficiency anemia 10/02/2010  . [place]rial [place] ([place] 08/26/2009  . Morbid obesity ([place] 01/09/2008  . Asthma, well controlled 01/09/2008                                                                Post partum procedures:intravenous Feraheme  Pain control:    BKaterina, Zurn[[[id number]] Spinal   BLorrin, BodnerTiffany [[[id number]] Spinal Complications: None   Hospital course:  Sceduled C/S   33y.o. yo G2P1103 at 3109w2das admitted to the hospital 02/10/2019 for scheduled cesarean section with the following indication:Prior Uterine Surgery and twins.  Membrane Rupture Time/Date:    BaThais, Silberstein0[[id number]]8:00 AM   BaKalayla, Shaddeniffany [[id number]]8:00 AM ,   BaNikitha, Mode0[[id number]]02/10/2019   BaCharlottie, Peragine0[[id number]]02/10/2019   Patient delivered a Viable infant.   BaOliveah, Zwackiffany [[id number]]02/10/2019   BaJoury, Allcorn[[id number]]02/10/2019  Details of operation can be found in separate operative note.  Pateint had an uncomplicated postpartum course.  She is ambulating, tolerating a regular diet, passing flatus, and urinating well. Patient is discharged home in stable condition on  02/13/19         Physical exam  Vitals:   02/12/19 0643 02/12/19 1500 02/12/19 2151 02/13/19 0547  BP: 126/75 (!) 90/57 108/60 112/68  Pulse: 84 91 86 86  Resp: _0 Temp: 98.1 F (36.7 C) 98.5 F (36.9 C) 99 F (37.2 C) 98.5 F (36.9 C)  TempSrc: Oral Oral Oral Oral  SpO2:  99%  98%   General: alert, cooperative and no distress Lochia: appropriate Uterine Fundus: firm Incision: Dressing is clean, dry, and intact DVT Evaluation: No cords or calf tenderness. No significant calf/ankle edema. Labs: Lab Results  Component Value Date   WBC 11.0 (H) 02/11/2019   HGB 9.6 (L) 02/11/2019   HCT 28.4 (L) 02/11/2019   MCV 91.6 02/11/2019   PLT 134 (L) 02/11/2019   CMP Latest Ref Rng & Units 02/11/2019  Glucose 70 - 99 mg/dL 79  BUN 6 - 20 mg/dL 8  Creatinine 0.44 - 1.00 mg/dL 0.78  Sodium 135 - 145 mmol/L 138  Potassium 3.5 - 5.1 mmol/L 4.1  Chloride 98 -  111 mmol/L 110  CO2 22 - 32 mmol/L 19(L)  Calcium 8.9 - 10.3 mg/dL 8.5(L)  Total Protein 6.5 - 8.1 g/dL 4.3(L)  Total Bilirubin 0.3 - 1.2 mg/dL 0.3  Alkaline Phos 38 - 126 U/L 129(H)  AST 15 - 41 U/L 16  ALT 0 - 44 U/L 7    Discharge instruction: per After Visit Summary and "Baby and Me Booklet".  After Visit Meds:  Allergies as of 02/13/2019      Reactions   Ciprofloxacin Anaphylaxis   Morphine And Related Anaphylaxis   Penicillins Anaphylaxis   Has patient had a PCN reaction causing immediate rash, facial/tongue/throat swelling, SOB or lightheadedness with hypotension: No Has patient had a PCN reaction causing severe rash involving mucus membranes or skin necrosis: No Has patient had a PCN reaction that required hospitalization: No Has patient  had a PCN reaction occurring within the last 10 years: No If all of the above answers are "NO", then may proceed with Cephalosporin use.      Medication List    STOP taking these medications   doxylamine (Sleep) 25 MG tablet Commonly known as:  UNISOM   progesterone 200 MG Supp     TAKE these medications   Cyanocobalamin 1000 MCG/ML Kit Inject 1,000 mcg into the muscle every Tuesday.   ferrous sulfate 300 (60 Fe) MG/5ML syrup Take 5 mLs (300 mg total) by mouth 2 (two) times daily with a meal. Mix in 4oz of orange juice   folic acid 161 MCG tablet Commonly known as:  FOLVITE Take 800 mcg by mouth daily.   HYDROmorphone 2 MG tablet Commonly known as:  DILAUDID Take 1 tablet (2 mg total) by mouth every 4 (four) hours as needed for severe pain.   ibuprofen 800 MG tablet Commonly known as:  ADVIL,MOTRIN Take 1 tablet (800 mg total) by mouth every 6 (six) hours.   levothyroxine 50 MCG tablet Commonly known as:  SYNTHROID, LEVOTHROID Take 50-100 mcg by mouth See admin instructions. Take 1 tablet (50 mcg) by mouth on Mondays, Wednesdays, Thursdays, Fridays, & Sundays in the mornings before breakfast. Take 2 tablets (100 mcg) by mouth on Tuesdays & Saturdays in the morning before breakfast.   magnesium oxide 400 (241.3 Mg) MG tablet Commonly known as:  MAG-OX Take 1 tablet (400 mg total) by mouth daily.   metoprolol succinate 25 MG 24 hr tablet Commonly known as:  TOPROL-XL TAKE ONE AND ONE-HALF TABLETS BY MOUTH DAILY What changed:    how much to take  when to take this  additional instructions   nystatin powder Commonly known as:  MYCOSTATIN/NYSTOP Apply topically to rash twice daily  for 1 week , then as needed What changed:    how much to take  how to take this  when to take this  reasons to take this  additional instructions   PRENATAL PO Take 1 tablet by mouth daily.   senna-docusate 8.6-50 MG tablet Commonly known as:  Senokot-S Take 2 tablets by  mouth daily. Start taking on:  February 14, 2019       Diet: routine diet  Activity: Advance as tolerated. Pelvic rest for 6 weeks.   Postpartum contraception: Not Discussed  Newborn Data:   Velina, Drollinger [[id number]]  Live born female  Birth Weight: 5 lb 5.2 oz (2415 g) APGAR: 19, 9  Newborn Delivery   Birth date/time:  02/10/2019 10:24:00 Delivery type:  C-Section, Low Transverse Trial of labor:  No C-section categorization:  Repeat  Marcellina, Jonsson [[id number]]  Live born female  Birth Weight: 5 lb 7.8 oz (2490 g) APGAR: 7, 6  Newborn Delivery   Birth date/time:  02/10/2019 10:24:00 Delivery type:  C-Section, Low Transverse Trial of labor:  No C-section categorization:  Repeat    Baby Feeding: Bottle Disposition:home with mother   Delivery Report:  Review the Delivery Report for details.    Follow up: Follow-up Information    Brien Few, MD. Schedule an appointment as soon as possible for a visit in 6 week(s).   Specialty:  Obstetrics and Gynecology Contact information: [place] [address] [phone]         Outpatient Feraheme infusion at [facility] on 03/01/2019    Signed: Juliene Pina, CNM, MSN 02/13/2019, 10:07 AM

## 2019-02-13 NOTE — Progress Notes (Signed)
[name] was referred for history of depression.  * Referral screened out by Clinical Social Worker because none of the following criteria appear to apply:  ~ History of anxiety/depression during this pregnancy, or of post-partum depression following prior delivery. ~ Diagnosis of anxiety and/or depression within last 3 years OR * [name]'s symptoms currently being treated with medication and/or therapy.  Please contact the Clinical Social Worker if needs arise, by [facility] request, or if [name] scores greater than 9/yes to question 10 on Edinburgh Postpartum Depression Screen.  Ollen Barges, [place]  [facility] [employer] [phone]

## 2019-02-14 ENCOUNTER — Encounter (HOSPITAL_COMMUNITY)
Admission: RE | Admit: 2019-02-14 | Discharge: 2019-02-14 | Disposition: A | Discharge status: Home / Self Care | Payer: BLUE CROSS/BLUE SHIELD | Source: Ambulatory Visit

## 2019-02-14 DIAGNOSIS — Z00110 Health examination for newborn under 8 days old: Secondary | ICD-10-CM | POA: Diagnosis not present

## 2019-02-14 HISTORY — DX: Nausea with vomiting, unspecified: R11.2

## 2019-02-14 HISTORY — DX: Other specified postprocedural states: R11.2

## 2019-02-14 HISTORY — DX: Other specified postprocedural states: Z98.890

## 2019-02-15 ENCOUNTER — Inpatient Hospital Stay (HOSPITAL_COMMUNITY)
Admission: RE | Admit: 2019-02-15 | Payer: BLUE CROSS/BLUE SHIELD | Source: Home / Self Care | Admitting: Obstetrics and Gynecology

## 2019-02-19 ENCOUNTER — Encounter (HOSPITAL_COMMUNITY): Payer: Self-pay | Admitting: *Deleted

## 2019-02-19 ENCOUNTER — Other Ambulatory Visit: Payer: Self-pay

## 2019-02-19 ENCOUNTER — Inpatient Hospital Stay (HOSPITAL_COMMUNITY): Payer: BLUE CROSS/BLUE SHIELD

## 2019-02-19 ENCOUNTER — Inpatient Hospital Stay (HOSPITAL_COMMUNITY)
Admission: AD | Admit: 2019-02-19 | Discharge: 2019-02-21 | DRG: 776 | Disposition: A | Discharge status: Home / Self Care | Payer: BLUE CROSS/BLUE SHIELD | Attending: Obstetrics and Gynecology | Admitting: Obstetrics and Gynecology

## 2019-02-19 DIAGNOSIS — O9089 Other complications of the puerperium, not elsewhere classified: Secondary | ICD-10-CM | POA: Diagnosis not present

## 2019-02-19 DIAGNOSIS — O9081 Anemia of the puerperium: Secondary | ICD-10-CM | POA: Diagnosis present

## 2019-02-19 DIAGNOSIS — R4020 Unspecified coma: Secondary | ICD-10-CM | POA: Diagnosis not present

## 2019-02-19 DIAGNOSIS — O8612 Endometritis following delivery: Secondary | ICD-10-CM | POA: Diagnosis present

## 2019-02-19 DIAGNOSIS — Z880 Allergy status to penicillin: Secondary | ICD-10-CM

## 2019-02-19 DIAGNOSIS — Z3A35 35 weeks gestation of pregnancy: Secondary | ICD-10-CM | POA: Diagnosis not present

## 2019-02-19 DIAGNOSIS — R55 Syncope and collapse: Secondary | ICD-10-CM | POA: Diagnosis not present

## 2019-02-19 DIAGNOSIS — R000 Tachycardia, unspecified: Secondary | ICD-10-CM | POA: Diagnosis not present

## 2019-02-19 DIAGNOSIS — O902 Hematoma of obstetric wound: Secondary | ICD-10-CM | POA: Diagnosis not present

## 2019-02-19 DIAGNOSIS — E039 Hypothyroidism, unspecified: Secondary | ICD-10-CM | POA: Diagnosis present

## 2019-02-19 DIAGNOSIS — O99285 Endocrine, nutritional and metabolic diseases complicating the puerperium: Secondary | ICD-10-CM | POA: Diagnosis present

## 2019-02-19 DIAGNOSIS — Z4889 Encounter for other specified surgical aftercare: Secondary | ICD-10-CM | POA: Diagnosis not present

## 2019-02-19 DIAGNOSIS — R9389 Abnormal findings on diagnostic imaging of other specified body structures: Secondary | ICD-10-CM | POA: Diagnosis not present

## 2019-02-19 DIAGNOSIS — O99845 Bariatric surgery status complicating the puerperium: Secondary | ICD-10-CM | POA: Diagnosis not present

## 2019-02-19 DIAGNOSIS — R58 Hemorrhage, not elsewhere classified: Secondary | ICD-10-CM | POA: Diagnosis not present

## 2019-02-19 LAB — CBC
HCT: 24.3 % — ABNORMAL LOW (ref 36.0–46.0)
Hemoglobin: 7.7 g/dL — ABNORMAL LOW (ref 12.0–15.0)
MCH: 29.8 pg (ref 26.0–34.0)
MCHC: 31.7 g/dL (ref 30.0–36.0)
MCV: 94.2 fL (ref 80.0–100.0)
Platelets: 264 10*3/uL (ref 150–400)
RBC: 2.58 MIL/uL — ABNORMAL LOW (ref 3.87–5.11)
RDW: 13.6 % (ref 11.5–15.5)
WBC: 12.6 10*3/uL — ABNORMAL HIGH (ref 4.0–10.5)
nRBC: 0 % (ref 0.0–0.2)

## 2019-02-19 MED ORDER — LACTATED RINGERS IV BOLUS
1000.0000 mL | Freq: Once | INTRAVENOUS | Status: AC
  Administered 2019-02-19: 1000 mL via INTRAVENOUS

## 2019-02-19 MED ORDER — METHYLERGONOVINE MALEATE 0.2 MG/ML IJ SOLN
0.2000 mg | Freq: Once | INTRAMUSCULAR | Status: AC
  Administered 2019-02-19: 0.2 mg via INTRAMUSCULAR
  Filled 2019-02-19: qty 1

## 2019-02-19 NOTE — MAU Note (Signed)
Delivered via C/S on April  10, left the hosp on the 14th with normal vag bleeding. On April 17th the bleeding increased to 1 soaked pad every 2hours.  Today the bleeding increased to 3 soaked pads per hour until tonight when she switched to towels and soaked through two towels  Prior to arriving in MAU. Reported fainting when EMS arrived . Also states she has lower pelvic pain that started today.Has a temp of 99.9 upon arrival.

## 2019-02-19 NOTE — MAU Provider Note (Signed)
History     CSN: [id number]  Arrival date and time: 02/19/19 2135   First Provider Initiated Contact with Patient 02/19/19 2144      Chief Complaint  Patient presents with  . Vaginal Bleeding   HPI  Ms.  Alejandra Barr is a 33 y.o. year old G59P1103 female at 22w2dweeks gestation who presents to MAU via EMS reporting heavy vaginal bleeding. She is s/p C/S of Di-Di twins on 02/10/19. She was d/c'd home on 02/14/19. She reports that on 02/17/2019 her vaginal bleeding increased to soaking 1 pad every 2 hrs. Today, the bleeding increased to soaking 3 pads/hr until tonight she switched to towels and has soaked through 2 towels prior to arriving to MAU. Dr. TRonita Hippsis [facility] provider at [facility]. She called [facility] and spoke to Dr. MBenjie Karvonen Dr. MBenjie Karvonencalled in a Rx for Methergine to her pharmacy, but they did not have the medication. She said Dr. MBenjie Karvonencalled in another medication, but she can't remember the name of it. She reports that when her family member went to pick up the medication, she "started feeling really bad and called EMS." She reports she "fainted when EMS arrived" to get her. She also reports lower pelvic pain that started today. Her reported BP by EMS was 71/50. Her temp upon arrival was 99.9. She has an IV indwelling in RT arm upon arrival.   Past Medical History:  Diagnosis Date  . Asthma   . [place]    Status post RFA by Dr. TLovena Lein 2004  . Chronic anxiety   . Chronic depression   . 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 Iron deficiency   . Iron deficiency anemia 10/02/2010   Qualifier: Diagnosis of  By: SMoshe CiproMD, MJoycelyn Schmid   . Low vitamin B12 level 07/10/2013   Overview:  Last Assessment & Plan:  Being treated through hematology  . Newborn product of in vitro fertilization (IVF) pregnancy   . Obesity   . PONV (postoperative nausea and vomiting)     Past Surgical History:  Procedure Laterality Date  . Bilateral eustachian tube placement on 4 different occasions    .  CARDIAC ELECTROPHYSIOLOGY MAPPING AND ABLATION    . CESAREAN SECTION N/A 05/26/2017   Procedure: CESAREAN SECTION;  Surgeon: MLinda Hedges DO;  [facility]  Service: Obstetrics;  Laterality: N/A;  . CESAREAN SECTION MULTI-GESTATIONAL N/A 02/10/2019   Procedure: Repeat CESAREAN SECTION MULTI-GESTATIONAL;  Surgeon: TBrien Few MD;  [facility];  Service: Obstetrics;  Laterality: N/A;  EDD: 03/15/19 Allergy: Penicillin, Morphine, Cipro  . CHOLECYSTECTOMY  2003  . COLONOSCOPY N/A 11/29/2013   Procedure: COLONOSCOPY;  Surgeon: NRogene Houston MD;  [facility];  Service: Endoscopy;  Laterality: N/A;  120-rescheduled to 300 Ann notified pt  . GASTRIC BYPASS  2006  . TONSILLECTOMY  1990    Family History  Problem Relation Age of Onset  . Fibromyalgia Mother        Chronic pelvic pain   . Hypothyroidism Mother   . Obesity Mother   . Heart defect Mother        Supraventricular, tachycardia /tachypalpation   . Arrhythmia Mother   . Breast cancer Mother 443 . Hypertension Maternal Grandmother   . Diabetes Maternal Grandmother   . Hypertension Maternal Grandfather   . Heart disease Paternal Grandmother   . Hypertension Paternal Grandmother   . Heart disease Paternal Grandfather   . Hypertension Paternal  Grandfather     Social History   Tobacco Use  . Smoking status: Former Smoker    Packs/day: 0.50    Types: Cigarettes    Start date: 03/21/2013    Last attempt to quit: 06/02/2015    Years since quitting: 3.7  . Smokeless tobacco: Never Used  Substance Use Topics  . Alcohol use: No    Alcohol/week: 0.0 standard drinks    Comment: sober alcoholic 6 years  . Drug use: No    Allergies:  Allergies  Allergen Reactions  . Ciprofloxacin Anaphylaxis  . Morphine And Related Anaphylaxis  . Penicillins Anaphylaxis    Has patient had a PCN reaction causing immediate rash, facial/tongue/throat swelling, SOB or lightheadedness with hypotension:  No Has patient had a PCN reaction causing severe rash involving mucus membranes or skin necrosis: No Has patient had a PCN reaction that required hospitalization: No Has patient had a PCN reaction occurring within the last 10 years: No If all of the above answers are "NO", then may proceed with Cephalosporin use.    Medications Prior to Admission  Medication Sig Dispense Refill Last Dose  . Cyanocobalamin 1000 MCG/ML KIT Inject 1,000 mcg into the muscle every Tuesday.    Past Week at Unknown time  . ferrous sulfate 300 (60 Fe) MG/5ML syrup Take 5 mLs (300 mg total) by mouth 2 (two) times daily with a meal. Mix in 4oz of orange juice 150 mL 3 02/19/2019 at Unknown time  . folic acid (FOLVITE) 035 MCG tablet Take 800 mcg by mouth daily.   02/19/2019 at Unknown time  . HYDROmorphone (DILAUDID) 2 MG tablet Take 1 tablet (2 mg total) by mouth every 4 (four) hours as needed for severe pain. 30 tablet 0 02/19/2019 at Unknown time  . ibuprofen (ADVIL,MOTRIN) 800 MG tablet Take 1 tablet (800 mg total) by mouth every 6 (six) hours. 30 tablet 0 02/19/2019 at Unknown time  . levothyroxine (SYNTHROID, LEVOTHROID) 50 MCG tablet Take 50-100 mcg by mouth See admin instructions. Take 1 tablet (50 mcg) by mouth on Mondays, Wednesdays, Thursdays, Fridays, & Sundays in the mornings before breakfast. Take 2 tablets (100 mcg) by mouth on Tuesdays & Saturdays in the morning before breakfast.  2 02/19/2019 at Unknown time  . metoprolol succinate (TOPROL-XL) 25 MG 24 hr tablet TAKE ONE AND ONE-HALF TABLETS BY MOUTH DAILY (Patient taking differently: Take 12.5-25 mg by mouth See admin instructions. Take 1 tablet (25 mg) by mouth in the morning & take 0.5 tablet (12.5 mg) by mouth at 3 pm.) 135 tablet 2 02/19/2019 at Unknown time  . nystatin (MYCOSTATIN/NYSTOP) powder Apply topically to rash twice daily  for 1 week , then as needed (Patient taking differently: Apply 1 g topically daily as needed (skin folds/rash). ) 60 g 1 Past  Month at Unknown time  . Prenatal Vit-Fe Fumarate-FA (PRENATAL PO) Take 1 tablet by mouth daily.   02/19/2019 at Unknown time  . senna-docusate (SENOKOT-S) 8.6-50 MG tablet Take 2 tablets by mouth daily.   Past Week at Unknown time  . magnesium oxide (MAG-OX) 400 (241.3 Mg) MG tablet Take 1 tablet (400 mg total) by mouth daily.       Review of Systems  Constitutional: Negative.   HENT: Negative.   Eyes: Negative.   Respiratory: Negative.   Cardiovascular: Negative.   Gastrointestinal: Negative.   Endocrine: Negative.   Genitourinary: Positive for pelvic pain (lots of lower pelvic pain) and vaginal bleeding (soaking lots of pads and towels  prior to arrival).  Musculoskeletal: Negative.   Skin: Positive for wound (C/S).  Allergic/Immunologic: Negative.   Neurological: Positive for syncope (when EMS arrived at house).  Hematological: Negative.   Psychiatric/Behavioral: Negative.    Physical Exam   Patient Vitals for the past 24 hrs:  BP Temp Temp src Pulse Resp  02/19/19 2338 (!) 98/46 99.3 F (37.4 C) Oral 93 16  02/19/19 2237 (!) 94/55 - - 100 -  02/19/19 2202 (!) 108/55 - - (!) 114 -  02/19/19 2137 115/66 99.9 F (37.7 C) Oral (!) 115 16     Physical Exam  Nursing note and vitals reviewed. Constitutional: She is oriented to person, place, and time. She appears well-developed and well-nourished.  HENT:  Head: Normocephalic and atraumatic.  Eyes: Pupils are equal, round, and reactive to light.  Neck: Normal range of motion.  Cardiovascular: Normal rate, regular rhythm, normal heart sounds and intact distal pulses.  Respiratory: Effort normal and breath sounds normal.  GI: Soft. Bowel sounds are normal.  Genitourinary:    Genitourinary Comments: Copious amounts of bleeding noted with speculum exam / copious amounts of large blood clots removed with Ring forceps and large cotton swabs / lower abdomen tender with fundal massage - uterus firm and midline / no increased bleeding  noted with fundal massage / bimanual deferred   Musculoskeletal: Normal range of motion.  Neurological: She is alert and oriented to person, place, and time.  Skin: Skin is warm and dry.  C/S incision C/D/I -- steristrips in place  Psychiatric: She has a normal mood and affect. Her behavior is normal. Judgment and thought content normal.    MAU Course  Procedures  MDM CBC OB Limited U/S LR 1000 ml IV bolus; then 125 ml/hr Methergine 0.2 mg IM -- VB remains moderate with medium sized blood clots  *Consult with Dr. Benjie Karvonen @ 1215 - notified of patient's complaints, assessments, lab & U/S results, recommended tx plan admit to [place] for 23 hr obs - Dr. Benjie Karvonen will enter orders  Results for orders placed or performed during the hospital encounter of 02/19/19 (from the past 24 hour(s))  CBC     Status: Abnormal   Collection Time: 02/19/19 10:30 PM  Result Value Ref Range   WBC 12.6 (H) 4.0 - 10.5 K/uL   RBC 2.58 (L) 3.87 - 5.11 MIL/uL   Hemoglobin 7.7 (L) 12.0 - 15.0 g/dL   HCT 24.3 (L) 36.0 - 46.0 %   MCV 94.2 80.0 - 100.0 fL   MCH 29.8 26.0 - 34.0 pg   MCHC 31.7 30.0 - 36.0 g/dL   RDW 13.6 11.5 - 15.5 %   Platelets 264 150 - 400 K/uL   nRBC 0.0 0.0 - 0.2 %    US Pelvis Limited (transabdominal Only)  Result Date: 02/19/2019 CLINICAL DATA:  Excessive postpartum bleeding EXAM: TRANSABDOMINAL ULTRASOUND OF PELVIS TECHNIQUE: Transabdominal ultrasound examination of the pelvis was performed including evaluation of the uterus, ovaries, adnexal regions, and pelvic cul-de-sac. COMPARISON:  None. FINDINGS: Uterus Measurements: 14.3 x 8.0 x 8.8 cm = volume: 1,007 mL. Enlarged, postpartum appearance. No focal abnormality. Endometrium Thickness: Thickened, 22 mm. Heterogeneous appearance with peripheral vascularity Right ovary Measurements: Not visualized.  No adnexal mass seen. Left ovary Measurements: Not visualized.  No adnexal mass seen. Other findings: Complex fluid collection noted anterior to  the uterus near the C-section scar measuring 10.0 x 4.5 x 15.4 cm, likely hematoma. IMPRESSION: Thickened, heterogeneous endometrium concerning for retained products of conception. Large  complex fluid collection anterior to the uterus near the C-section scar measuring 15.4 x 10.0 x 4.5 cm, likely postoperative hematoma. Electronically Signed   By: Rolm Baptise M.D.   On: 02/19/2019 23:30    Assessment and Plan  1. Retained products of conception, postpartum  2. Excessive postpartum bleeding   3. Delayed postpartum hemorrhage - Admit to OBSU for 23 hr observation - See Dr. Gardiner Coins admission orders & documentation for H&P  Laury Deep, MSN, CNM 02/20/2019, 10:05 PM

## 2019-02-20 ENCOUNTER — Other Ambulatory Visit: Payer: Self-pay

## 2019-02-20 ENCOUNTER — Other Ambulatory Visit: Payer: Self-pay | Admitting: Obstetrics and Gynecology

## 2019-02-20 DIAGNOSIS — Z4889 Encounter for other specified surgical aftercare: Secondary | ICD-10-CM | POA: Diagnosis not present

## 2019-02-20 DIAGNOSIS — O9081 Anemia of the puerperium: Secondary | ICD-10-CM | POA: Diagnosis not present

## 2019-02-20 DIAGNOSIS — O902 Hematoma of obstetric wound: Secondary | ICD-10-CM | POA: Diagnosis not present

## 2019-02-20 DIAGNOSIS — O8612 Endometritis following delivery: Secondary | ICD-10-CM | POA: Diagnosis not present

## 2019-02-20 DIAGNOSIS — O9089 Other complications of the puerperium, not elsewhere classified: Secondary | ICD-10-CM | POA: Diagnosis not present

## 2019-02-20 DIAGNOSIS — Z880 Allergy status to penicillin: Secondary | ICD-10-CM | POA: Diagnosis not present

## 2019-02-20 DIAGNOSIS — E039 Hypothyroidism, unspecified: Secondary | ICD-10-CM | POA: Diagnosis present

## 2019-02-20 DIAGNOSIS — O99285 Endocrine, nutritional and metabolic diseases complicating the puerperium: Secondary | ICD-10-CM | POA: Diagnosis present

## 2019-02-20 DIAGNOSIS — O99845 Bariatric surgery status complicating the puerperium: Secondary | ICD-10-CM | POA: Diagnosis present

## 2019-02-20 LAB — CBC
HCT: 20.1 % — ABNORMAL LOW (ref 36.0–46.0)
HCT: 26 % — ABNORMAL LOW (ref 36.0–46.0)
Hemoglobin: 6.6 g/dL — CL (ref 12.0–15.0)
Hemoglobin: 8.4 g/dL — ABNORMAL LOW (ref 12.0–15.0)
MCH: 31.1 pg (ref 26.0–34.0)
MCH: 29.4 pg (ref 26.0–34.0)
MCHC: 32.8 g/dL (ref 30.0–36.0)
MCHC: 32.3 g/dL (ref 30.0–36.0)
MCV: 94.8 fL (ref 80.0–100.0)
MCV: 90.9 fL (ref 80.0–100.0)
Platelets: 225 10*3/uL (ref 150–400)
Platelets: 234 10*3/uL (ref 150–400)
RBC: 2.12 MIL/uL — ABNORMAL LOW (ref 3.87–5.11)
RBC: 2.86 MIL/uL — ABNORMAL LOW (ref 3.87–5.11)
RDW: 13.7 % (ref 11.5–15.5)
RDW: 14.6 % (ref 11.5–15.5)
WBC: 9.8 10*3/uL (ref 4.0–10.5)
WBC: 9.4 10*3/uL (ref 4.0–10.5)
nRBC: 0 % (ref 0.0–0.2)
nRBC: 0 % (ref 0.0–0.2)

## 2019-02-20 LAB — PREPARE RBC (CROSSMATCH)

## 2019-02-20 LAB — PROTIME-INR
INR: 1.1 (ref 0.8–1.2)
Prothrombin Time: 14.2 seconds (ref 11.4–15.2)

## 2019-02-20 LAB — FIBRINOGEN: Fibrinogen: 385 mg/dL (ref 210–475)

## 2019-02-20 MED ORDER — ACETAMINOPHEN 325 MG PO TABS: 650.0000 mg | ORAL_TABLET | Freq: Four times a day (QID) | ORAL | Status: DC | PRN

## 2019-02-20 MED ORDER — HYDROMORPHONE HCL 2 MG PO TABS
1.0000 mg | ORAL_TABLET | Freq: Four times a day (QID) | ORAL | Status: DC | PRN
  Administered 2019-02-20 (×3): 1 mg via ORAL
  Filled 2019-02-20 (×3): qty 1

## 2019-02-20 MED ORDER — LEVOTHYROXINE SODIUM 50 MCG PO TABS
50.0000 ug | ORAL_TABLET | Freq: Every day | ORAL | Status: DC
  Administered 2019-02-20 – 2019-02-21 (×2): 50 ug via ORAL
  Filled 2019-02-20 (×2): qty 1

## 2019-02-20 MED ORDER — KETOROLAC TROMETHAMINE 30 MG/ML IJ SOLN
30.0000 mg | Freq: Four times a day (QID) | INTRAMUSCULAR | Status: DC | PRN
  Administered 2019-02-20: 30 mg via INTRAVENOUS
  Filled 2019-02-20: qty 1

## 2019-02-20 MED ORDER — GENTAMICIN SULFATE 40 MG/ML IJ SOLN
5.0000 mg/kg | INTRAVENOUS | Status: DC
  Administered 2019-02-20 – 2019-02-21 (×2): 540 mg via INTRAVENOUS
  Filled 2019-02-20 (×3): qty 13.5

## 2019-02-20 MED ORDER — ACETAMINOPHEN 325 MG PO TABS
650.0000 mg | ORAL_TABLET | Freq: Once | ORAL | Status: AC
  Administered 2019-02-20: 650 mg via ORAL
  Filled 2019-02-20: qty 2

## 2019-02-20 MED ORDER — CLINDAMYCIN PHOSPHATE 900 MG/50ML IV SOLN
900.0000 mg | Freq: Three times a day (TID) | INTRAVENOUS | Status: DC
  Administered 2019-02-20 – 2019-02-21 (×4): 900 mg via INTRAVENOUS
  Filled 2019-02-20 (×4): qty 50

## 2019-02-20 MED ORDER — DIPHENHYDRAMINE HCL 25 MG PO CAPS
25.0000 mg | ORAL_CAPSULE | Freq: Once | ORAL | Status: AC
  Administered 2019-02-20: 25 mg via ORAL
  Filled 2019-02-20: qty 1

## 2019-02-20 MED ORDER — TRANEXAMIC ACID 650 MG PO TABS
1300.0000 mg | ORAL_TABLET | Freq: Once | ORAL | Status: AC
  Administered 2019-02-20: 1300 mg via ORAL
  Filled 2019-02-20: qty 2

## 2019-02-20 MED ORDER — SODIUM CHLORIDE 0.9 % IV SOLN
510.0000 mg | Freq: Once | INTRAVENOUS | Status: AC
  Administered 2019-02-20: 510 mg via INTRAVENOUS
  Filled 2019-02-20 (×2): qty 17

## 2019-02-20 MED ORDER — SODIUM CHLORIDE 0.9% FLUSH: 3.0000 mL | INTRAVENOUS | Status: DC | PRN

## 2019-02-20 MED ORDER — ACETAMINOPHEN 650 MG RE SUPP: 650.0000 mg | Freq: Four times a day (QID) | RECTAL | Status: DC | PRN

## 2019-02-20 MED ORDER — PNEUMOCOCCAL VAC POLYVALENT 25 MCG/0.5ML IJ INJ
0.5000 mL | INJECTION | INTRAMUSCULAR | Status: DC
  Filled 2019-02-20: qty 0.5

## 2019-02-20 MED ORDER — DEXTROSE IN LACTATED RINGERS 5 % IV SOLN
INTRAVENOUS | Status: DC
  Administered 2019-02-20 (×2): via INTRAVENOUS

## 2019-02-20 MED ORDER — METHYLERGONOVINE MALEATE 0.2 MG PO TABS
0.2000 mg | ORAL_TABLET | Freq: Three times a day (TID) | ORAL | Status: AC
  Administered 2019-02-20 (×3): 0.2 mg via ORAL
  Filled 2019-02-20 (×3): qty 1

## 2019-02-20 MED ORDER — SODIUM CHLORIDE 0.9% IV SOLUTION: Freq: Once | INTRAVENOUS | Status: DC

## 2019-02-20 NOTE — Progress Notes (Signed)
Patient ID: Alejandra Barr, female   DOB: September 26, 1986, 33 y.o.   MRN: [id number]  Subjective: Feels better, completed 1st unit pRBCs. No dizziness/ SOB. No syncope when ambulated to bathroom. Vaginal bleeding improved a lot.  2 minimally soaked pads overnight. Pain well controlled with Dilaudid  US findings and discussion with Radiologist and Dr Ronita Hipps reviewed with patient.   Objective: Vital signs in last 24 hours: Temp:  [98.1 F (36.7 C)-99.9 F (37.7 C)] 98.7 F (37.1 C) (04/20 1011) Pulse Rate:  [85-115] 100 (04/20 1219) Resp:  [16-18] 16 (04/20 1219) BP: (94-141)/(46-71) 109/60 (04/20 1219) SpO2:  [95 %-100 %] 97 % (04/20 1015) Weight:  [108.9 kg] 108.9 kg (04/20 1100) Weight change:     Intake/Output from previous day: 04/19 0701 - 04/20 0700 In: 25 [P.O.:25] Out: 390 [Blood:390] Intake/Output this shift: Total I/O In: 1120.2 [I.V.:593.2; Blood:363; IV Piggyback:164] Out: 450 [Urine:450]  Physical exam:  A&O x 3, no acute distress. Pleasant, color improved.  HEENT neg Lungs CTA bilat CV RRR, S1S2 normal Abdo soft, non tender, non acute. Uterine tenderness improved. Incision clean, closed but 1 cm superficial opening at right end with slight bloody drainage.  Extr no edema/ no calf tenderness Pelvic deferred since active bleeding resolved   Lab Results: Recent Labs    02/19/19 2230 02/20/19 0450  WBC 12.6* 9.8  HGB 7.7* 6.6*  HCT 24.3* 20.1*  PLT 264 225    Studies/Results: Discussed sono findings with Radiologist on call this morning.  No obvious POCs/ placenta noted in endometrial cavity, increased vascularity in endometrial wall and myometrium. Lower segment clots. The hematoma described on sono is in abdominal wall and no intra-abdominal in uterus/ bladder space. Abdominal wall hematoma subcu and some involving myofascial planes.    Assessment/Plan: HD#1, PPD/post repeat C/s D9 with acute postpartum hemorrhage and symptomatic anemia, no retained POCs  and no D&C needed.  Anterior abdominal wall hematoma Endomyometritis   -2 units pRBCs today. S/p IV Feraheme last night, repeat CBC at 5 pm -Stop IVFluids after blood transfusion -Abdo wall hematoma, pain well controlled with Dilaudid/ Tylenol. IV toradol yesterday. Cant take Ibuprofen PO due to gastric bypass. No other intervention/ drainage etc, f/up in office in 2 wks  -Endomyometritis - Genta/ Clinda 24 -36 hrs and reassess for discharge tomorrow   Findings and plan d/w pt, she voiced understanding    LOS: 0 days   Alejandra Barr 02/20/2019, 12:57 PM

## 2019-02-20 NOTE — Progress Notes (Signed)
Patient ID: Alejandra Barr, female   DOB: Dec 17, 1985, 33 y.o.   MRN: [id number] Spoke with RN Pad changed once since on Ante unit, 35 cc volume by weight VS stable, pain controlled with Dilaudid 1 mg PO Continue to monitor   V.Tamila Gaulin MD

## 2019-02-20 NOTE — H&P (Addendum)
Alejandra Barr is an 33 y.o. female - readmission for postpartum bleeding and hypotension.   Alejandra Barr female presents to [facility]U via EMS reporting heavy vaginal bleeding. S/p repeat C/S of Di-Di twins on 02/10/19 at 63 wks for labor and breech babies, surgery was uncomplicated per report and she was d/c'd home on 02/14/19.  She reports that on 02/17/2019 her vaginal bleeding increased to soaking 1 pad every 2 hrs. Today, the bleeding increased to soaking 3 pads/hr until tonight she switched to towels and has soaked through 2 towels prior to arriving to MAU.  Dr. Ronita Hipps is her OB provider at [facility]. She called [facility] answering service and spoke to me, Rx for Methergine was called but not available at her pharmacy, so Cytotec 826mg was called, her family member went to pick up the medication, but she "started feeling really bad and called EMS." She reports she "fainted when EMS arrived" to get her. She also reports lower pelvic pain that started today. Her reported BP by EMS was 71/50. Her temp upon arrival was 99.9. She has an IV indwelling in RT arm upon arrival.  Medical Hx - completed by Asthma, cardiac ablation, gastric bypass, anemia, hypothyroidism, depression/anxiety    Past Medical History:  Diagnosis Date  . Asthma   [place]    Status post RFA by Dr. TLovena Lein 2004  . Chronic anxiety   . Chronic depression   . 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 Iron deficiency   . Iron deficiency anemia 10/02/2010   Qualifier: Diagnosis of  By: SMoshe CiproMD, MJoycelyn Schmid   . Low vitamin B12 level 07/10/2013   Overview:  Last Assessment & Plan:  Being treated through hematology  . Newborn product of in vitro fertilization (IVF) pregnancy   . Obesity   . PONV (postoperative nausea and vomiting)     Past Surgical History:  Procedure Laterality Date  . Bilateral eustachian tube placement on 4 different occasions    . CARDIAC ELECTROPHYSIOLOGY MAPPING AND ABLATION    . CESAREAN SECTION N/A 05/26/2017    Procedure: CESAREAN SECTION;  Surgeon: MLinda Hedges DO;  [place] [facility]  Service: Obstetrics;  Laterality: N/A;  . CESAREAN SECTION MULTI-GESTATIONAL N/A 02/10/2019   Procedure: Repeat CESAREAN SECTION MULTI-GESTATIONAL;  Surgeon: TBrien Few MD;  [facility];  Service: Obstetrics;  Laterality: N/A;  EDD: 03/15/19 Allergy: Penicillin, Morphine, Cipro  . CHOLECYSTECTOMY  2003  . COLONOSCOPY N/A 11/29/2013   Procedure: COLONOSCOPY;  Surgeon: NRogene Houston MD;  [facility];  Service: Endoscopy;  Laterality: N/A;  120-rescheduled to 300 Ann notified pt  . GASTRIC BYPASS  2006  . TONSILLECTOMY  1990    Family History  Problem Relation Age of Onset  . Fibromyalgia Mother        Chronic pelvic pain   . Hypothyroidism Mother   . Obesity Mother   . Heart defect Mother        Supraventricular, tachycardia /tachypalpation   . Arrhythmia Mother   . Breast cancer Mother 478 . Hypertension Maternal Grandmother   . Diabetes Maternal Grandmother   . Hypertension Maternal Grandfather   . Heart disease Paternal Grandmother   . Hypertension Paternal Grandmother   . Heart disease Paternal Grandfather   . Hypertension Paternal Grandfather     Social History:  reports that she quit smoking about 3 years ago. Her smoking use included cigarettes. She started smoking about 5 years ago. She smoked 0.50  packs per day. She has never used smokeless tobacco. She reports that she does not drink alcohol or use drugs.  Allergies:  Allergies  Allergen Reactions  . Ciprofloxacin Anaphylaxis  . Morphine And Related Anaphylaxis  . Penicillins Anaphylaxis    Has patient had a PCN reaction causing immediate rash, facial/tongue/throat swelling, SOB or lightheadedness with hypotension: No Has patient had a PCN reaction causing severe rash involving mucus membranes or skin necrosis: No Has patient had a PCN reaction that required hospitalization: No Has patient had a PCN  reaction occurring within the last 10 years: No If all of the above answers are "NO", then may proceed with Cephalosporin use.    Medications Prior to Admission  Medication Sig Dispense Refill Last Dose  . Cyanocobalamin 1000 MCG/ML KIT Inject 1,000 mcg into the muscle every Tuesday.    Past Week at Unknown time  . ferrous sulfate 300 (60 Fe) MG/5ML syrup Take 5 mLs (300 mg total) by mouth 2 (two) times daily with a meal. Mix in 4oz of orange juice 150 mL 3 02/19/2019 at Unknown time  . folic acid (FOLVITE) 494 MCG tablet Take 800 mcg by mouth daily.   02/19/2019 at Unknown time  . HYDROmorphone (DILAUDID) 2 MG tablet Take 1 tablet (2 mg total) by mouth every 4 (four) hours as needed for severe pain. 30 tablet 0 02/19/2019 at Unknown time  . ibuprofen (ADVIL,MOTRIN) 800 MG tablet Take 1 tablet (800 mg total) by mouth every 6 (six) hours. 30 tablet 0 02/19/2019 at Unknown time  . levothyroxine (SYNTHROID, LEVOTHROID) 50 MCG tablet Take 50-100 mcg by mouth See admin instructions. Take 1 tablet (50 mcg) by mouth on Mondays, Wednesdays, Thursdays, Fridays, & Sundays in the mornings before breakfast. Take 2 tablets (100 mcg) by mouth on Tuesdays & Saturdays in the morning before breakfast.  2 02/19/2019 at Unknown time  . metoprolol succinate (TOPROL-XL) 25 MG 24 hr tablet TAKE ONE AND ONE-HALF TABLETS BY MOUTH DAILY (Patient taking differently: Take 12.5-25 mg by mouth See admin instructions. Take 1 tablet (25 mg) by mouth in the morning & take 0.5 tablet (12.5 mg) by mouth at 3 pm.) 135 tablet 2 02/19/2019 at Unknown time  . nystatin (MYCOSTATIN/NYSTOP) powder Apply topically to rash twice daily  for 1 week , then as needed (Patient taking differently: Apply 1 g topically daily as needed (skin folds/rash). ) 60 g 1 Past Month at Unknown time  . Prenatal Vit-Fe Fumarate-FA (PRENATAL PO) Take 1 tablet by mouth daily.   02/19/2019 at Unknown time  . senna-docusate (SENOKOT-S) 8.6-50 MG tablet Take 2 tablets by  mouth daily.   Past Week at Unknown time  . magnesium oxide (MAG-OX) 400 (241.3 Mg) MG tablet Take 1 tablet (400 mg total) by mouth daily.       ROS  Blood pressure (!) 98/46, pulse 93, temperature 99.3 F (37.4 C), temperature source Oral, resp. rate 16, unknown if currently breastfeeding. Physical Exam  Patient Vitals for the past 24 hrs:  BP Temp Temp src Pulse Resp  02/19/19 2338 (!) 98/46 99.3 F (37.4 C) Oral 93 16  02/19/19 2237 (!) 94/55 - - 100 -  02/19/19 2202 (!) 108/55 - - (!) 114 -  02/19/19 2137 115/66 99.9 F (37.7 C) Oral (!) 115 16   Physical exam:  A&O x 3, no acute distress. Pleasant HEENT neg Lungs CTA bilat CV RRR, S1S2 normal Abdo soft, non tender, non acute. Incision CDI. No cellulitis. Uterine fundal  tenderness  Extr no edema/ tenderness Pelvic per MAU provider, cervix open, clots from vaginal canal removed.    Results for orders placed or performed during the hospital encounter of 02/19/19 (from the past 24 hour(s))  CBC     Status: Abnormal   Collection Time: 02/19/19 10:30 PM  Result Value Ref Range   WBC 12.6 (H) 4.0 - 10.5 K/uL   RBC 2.58 (L) 3.87 - 5.11 MIL/uL   Hemoglobin 7.7 (L) 12.0 - 15.0 g/dL   HCT 24.3 (L) 36.0 - 46.0 %   MCV 94.2 80.0 - 100.0 fL   MCH 29.8 26.0 - 34.0 pg   MCHC 31.7 30.0 - 36.0 g/dL   RDW 13.6 11.5 - 15.5 %   Platelets 264 150 - 400 K/uL   nRBC 0.0 0.0 - 0.2 %    US Pelvis Limited (transabdominal Only)  Result Date: 02/19/2019 CLINICAL DATA:  Excessive postpartum bleeding EXAM: TRANSABDOMINAL ULTRASOUND OF PELVIS TECHNIQUE: Transabdominal ultrasound examination of the pelvis was performed including evaluation of the uterus, ovaries, adnexal regions, and pelvic cul-de-sac. COMPARISON:  None. FINDINGS: Uterus Measurements: 14.3 x 8.0 x 8.8 cm = volume: 1,007 mL. Enlarged, postpartum appearance. No focal abnormality. Endometrium Thickness: Thickened, 22 mm. Heterogeneous appearance with peripheral vascularity Right ovary  Measurements: Not visualized.  No adnexal mass seen. Left ovary Measurements: Not visualized.  No adnexal mass seen. Other findings: Complex fluid collection noted anterior to the uterus near the C-section scar measuring 10.0 x 4.5 x 15.4 cm, likely hematoma. IMPRESSION: Thickened, heterogeneous endometrium concerning for retained products of conception. Large complex fluid collection anterior to the uterus near the C-section scar measuring 15.4 x 10.0 x 4.5 cm, likely postoperative hematoma. Electronically Signed   By: Rolm Baptise M.D.   On: 02/19/2019 23:30    Assessment/Plan: Postpartum hemorrhage and anemia, POD #10 Repeat C/s for twins  1) PPH'hage -Methergine IM in MAU and repeat q 8hrs PO Methergine 0.2 mg x 3 doses. Add single dose of PO TXA. Repeat CBC, add PT/INR, fibrinogen in AM and sooner if bleeding not better.  Maintain IV access and IVFluids, NPO overnight.  PO Tylenol/ Dilaudid and IV Toradol for pain mngmt (Morphine allergy and Gastric bypass)   2) ?POCs but also anterior uterine wall hematoma, will discuss with radiologist in AM if overnight bleeding not improved for possible sono guided D&C/ Bakri and intervene sooner if bleeding not better overnight reviewed with patient   3) ?Endometritis- uterine tenderness, recent C/s and slightly elevated WBC- start Gentamicin/ Clindamycin (PCN allergy-anaphylaxis) 4) Anemia- IV Iron 5) Hypothyroid- Synthroid 56mg in AM 6) Anxiety/ Depression- stable, no meds   VElveria Royals4/20/2020, 12:42 AM

## 2019-02-20 NOTE — Progress Notes (Signed)
CRITICAL VALUE ALERT  Critical Value:  Hgb = 6.5  Date & Time Notied:  0620  Provider Notified: Dr. Benjie Karvonen  Orders Received/Actions taken: YES

## 2019-02-20 NOTE — Progress Notes (Signed)
Patient ID: Alejandra Barr, female   DOB: 1985/12/23, 33 y.o.   MRN: [id number] [place] with radiologist this morning, images and patient findings reviewed. No obvious POCs noted, note endo-myometrial increased vascularity, possible clots in cervix/ lower segment. Anterior to the uterus 15x10 cm hematoma in not intra-abdomina, it is in the anterior abdominal wall, in subcu/ myofascial layer.   This morning, pt has reduced down vaginal bleeding tremendously, H/H is lower, possible equilibrated and some dilutional with overnight IV fluids for volume expansion. Transfuse 2 units pRBCs and continue Methergine, Gentamicin/ Clindamycin for endomyometritis and if remains stable discharge tomorrow.

## 2019-02-20 NOTE — Plan of Care (Signed)
  Problem: Pain Managment: Goal: General experience of comfort will improve Outcome: Progressing   

## 2019-02-21 DIAGNOSIS — O8612 Endometritis following delivery: Secondary | ICD-10-CM | POA: Diagnosis not present

## 2019-02-21 DIAGNOSIS — O902 Hematoma of obstetric wound: Secondary | ICD-10-CM | POA: Diagnosis not present

## 2019-02-21 DIAGNOSIS — O9089 Other complications of the puerperium, not elsewhere classified: Secondary | ICD-10-CM | POA: Diagnosis not present

## 2019-02-21 DIAGNOSIS — O9081 Anemia of the puerperium: Secondary | ICD-10-CM | POA: Diagnosis not present

## 2019-02-21 LAB — TYPE AND SCREEN
ABO/RH(D): O POS
Antibody Screen: NEGATIVE
Unit division: 0
Unit division: 0

## 2019-02-21 LAB — BPAM RBC
Blood Product Expiration Date: 202005012359
Blood Product Expiration Date: 202005012359
ISSUE DATE / TIME: 202004200954
ISSUE DATE / TIME: 202004201512
Unit Type and Rh: 5100
Unit Type and Rh: 5100

## 2019-02-21 MED ORDER — HYDROMORPHONE HCL 2 MG PO TABS: 2.0000 mg | ORAL_TABLET | ORAL | 0 refills | Status: AC | PRN

## 2019-02-21 NOTE — Progress Notes (Signed)
Patient ID: Alejandra Barr, female   DOB: 10-09-1986, 33 y.o.   MRN: [id number] HD 2 PPH delayed  S: NO complaints. Pain well controlled. Bleeding minimal O: BP (!) 100/58 (BP Location: Right Arm)   Pulse 81   Temp 98.2 F (36.8 C) (Oral)   Resp 17   Ht 5\' 1"  (1.549 m)   Wt 108.9 kg   SpO2 98%   BMI 45.36 kg/m   Lgs: CTA CV: RRR ABD: incision intact, minimal drainage from right aspect of incision, no erythema No CVAT EXT: neg c/c/e Neuro: non focal Skin: intact A: PPH delayed- uterine atony No s/s endometritis Seroma- draining spontaneously P: DC home Incision fu in office Dilaudid PO

## 2019-02-24 DIAGNOSIS — G43711 Chronic migraine without aura, intractable, with status migrainosus: Secondary | ICD-10-CM | POA: Diagnosis not present

## 2019-02-25 NOTE — Discharge Summary (Signed)
[name], Alejandra Barr MEDICAL RECORD WN:[id number] ACCOUNT [id number] DATE OF BIRTH:1985-11-19 FACILITY: [facility] LOCATION: [facility] PHYSICIAN:Camerin Ladouceur J. Ronita Hipps, MD  DISCHARGE SUMMARY  DATE OF DISCHARGE:  02/21/2019  HOSPITAL COURSE:  The patient underwent uncomplicated repeat C-section for twins in active labor on 04/10.  Postoperative course was uncomplicated and was discharged home on postop day #3.  She presented to the emergency room with heavy bleeding on  02/20/2019 and was treated effectively with uterotonics for postpartum hemorrhage.  Due to hemoglobin which dropped to as low as 5, she was given 2 units of packed red blood cells and an iron transfusion.  She remained stable.  Bleeding decreased  markedly with uterotonics.  At the time of her ultrasound, it was also noted that a large hematoma and subcutaneous tissue was noted consistent with a seroma.  She received some IV antibiotics for questionable endometritis and was discharged to home on  hospital day #2.  Discharge teaching was done.  She is to follow up in the office in 1 week for incision check and hemoglobin.  Her postoperative medications include prenatal vitamins and Dilaudid.  DISCHARGE STATUS:  Stable.  LN/NUANCE D:02/25/2019 T:02/25/2019 JOB:[id number]

## 2019-02-28 DIAGNOSIS — O909 Complication of the puerperium, unspecified: Secondary | ICD-10-CM | POA: Diagnosis not present

## 2019-03-01 ENCOUNTER — Ambulatory Visit (HOSPITAL_COMMUNITY): Payer: BLUE CROSS/BLUE SHIELD

## 2019-03-02 ENCOUNTER — Ambulatory Visit (HOSPITAL_COMMUNITY): Payer: BLUE CROSS/BLUE SHIELD

## 2019-03-06 ENCOUNTER — Inpatient Hospital Stay (HOSPITAL_COMMUNITY): Payer: BC Managed Care – PPO | Attending: Hematology

## 2019-03-06 ENCOUNTER — Other Ambulatory Visit: Payer: Self-pay

## 2019-03-06 VITALS — BP 118/68 | HR 74 | Temp 98.6°F | Resp 18

## 2019-03-06 DIAGNOSIS — D508 Other iron deficiency anemias: Secondary | ICD-10-CM | POA: Insufficient documentation

## 2019-03-06 MED ORDER — SODIUM CHLORIDE 0.9 % IV SOLN
510.0000 mg | Freq: Once | INTRAVENOUS | Status: AC
  Administered 2019-03-06: 510 mg via INTRAVENOUS
  Filled 2019-03-06: qty 510

## 2019-03-06 MED ORDER — SODIUM CHLORIDE 0.9 % IV SOLN
Freq: Once | INTRAVENOUS | Status: AC
  Administered 2019-03-06: 14:00:00 via INTRAVENOUS

## 2019-03-06 NOTE — Progress Notes (Signed)
Lucine H Nicolini tolerated Feraheme infusion well without complaints or incident. VSS upon discharge. Pt discharged self ambulatory in satisfactory condition

## 2019-03-06 NOTE — Patient Instructions (Signed)
[place] at [facility]  Discharge Instructions:  Feraheme infusion received today. _______________________________________________________________  Thank you for choosing [place] at [facility] to provide your oncology and hematology care.  To afford each patient quality time with our providers, please arrive at least 15 minutes before your scheduled appointment.  You need to re-schedule your appointment if you arrive 10 or more minutes late.  We strive to give you quality time with our providers, and arriving late affects you and other patients whose appointments are after yours.  Also, if you no show three or more times for appointments you may be dismissed from the clinic.  Again, thank you for choosing [place] at [place] hope is that these requests will allow you access to exceptional care and in a timely manner. _______________________________________________________________  If you have questions after your visit, please contact our office at [phone] between the hours of 8:30 a.m. and 5:00 p.m. Voicemails left after 4:30 p.m. will not be returned until the following business day. _______________________________________________________________  For prescription refill requests, have your pharmacy contact our office. _______________________________________________________________  Recommendations made by the consultant and any test results will be sent to your referring physician. _______________________________________________________________

## 2019-03-07 MED ORDER — DENOSUMAB 120 MG/1.7ML ~~LOC~~ SOLN
SUBCUTANEOUS | Status: AC
  Filled 2019-03-07: qty 1.7

## 2019-03-07 MED ORDER — LEUPROLIDE ACETATE 7.5 MG IM KIT
PACK | INTRAMUSCULAR | Status: AC
  Filled 2019-03-07: qty 7.5

## 2019-03-07 MED ORDER — LANREOTIDE ACETATE 120 MG/0.5ML ~~LOC~~ SOLN
SUBCUTANEOUS | Status: AC
  Filled 2019-03-07: qty 120

## 2019-03-08 MED ORDER — OCTREOTIDE ACETATE 30 MG IM KIT
PACK | INTRAMUSCULAR | Status: AC
  Filled 2019-03-08: qty 1

## 2019-03-14 DIAGNOSIS — Z79899 Other long term (current) drug therapy: Secondary | ICD-10-CM | POA: Diagnosis not present

## 2019-03-14 DIAGNOSIS — E668 Other obesity: Secondary | ICD-10-CM | POA: Diagnosis not present

## 2019-03-14 DIAGNOSIS — G43711 Chronic migraine without aura, intractable, with status migrainosus: Secondary | ICD-10-CM | POA: Diagnosis not present

## 2019-03-24 DIAGNOSIS — Z124 Encounter for screening for malignant neoplasm of cervix: Secondary | ICD-10-CM | POA: Diagnosis not present

## 2019-03-24 DIAGNOSIS — E039 Hypothyroidism, unspecified: Secondary | ICD-10-CM | POA: Diagnosis not present

## 2019-03-24 DIAGNOSIS — Z1151 Encounter for screening for human papillomavirus (HPV): Secondary | ICD-10-CM | POA: Diagnosis not present

## 2019-04-10 ENCOUNTER — Other Ambulatory Visit: Payer: Self-pay

## 2019-04-10 ENCOUNTER — Inpatient Hospital Stay (HOSPITAL_COMMUNITY): Payer: BC Managed Care – PPO | Attending: Hematology

## 2019-04-10 DIAGNOSIS — D509 Iron deficiency anemia, unspecified: Secondary | ICD-10-CM | POA: Diagnosis not present

## 2019-04-10 DIAGNOSIS — D508 Other iron deficiency anemias: Secondary | ICD-10-CM

## 2019-04-10 DIAGNOSIS — E559 Vitamin D deficiency, unspecified: Secondary | ICD-10-CM | POA: Diagnosis not present

## 2019-04-10 DIAGNOSIS — E538 Deficiency of other specified B group vitamins: Secondary | ICD-10-CM | POA: Diagnosis not present

## 2019-04-10 LAB — CBC WITH DIFFERENTIAL/PLATELET
Abs Immature Granulocytes: 0.03 10*3/uL (ref 0.00–0.07)
Basophils Absolute: 0.1 10*3/uL (ref 0.0–0.1)
Basophils Relative: 1 %
Eosinophils Absolute: 1.4 10*3/uL — ABNORMAL HIGH (ref 0.0–0.5)
Eosinophils Relative: 16 %
HCT: 38.9 % (ref 36.0–46.0)
Hemoglobin: 12.3 g/dL (ref 12.0–15.0)
Immature Granulocytes: 0 %
Lymphocytes Relative: 25 %
Lymphs Abs: 2.1 10*3/uL (ref 0.7–4.0)
MCH: 30.4 pg (ref 26.0–34.0)
MCHC: 31.6 g/dL (ref 30.0–36.0)
MCV: 96 fL (ref 80.0–100.0)
Monocytes Absolute: 0.4 10*3/uL (ref 0.1–1.0)
Monocytes Relative: 5 %
Neutro Abs: 4.3 10*3/uL (ref 1.7–7.7)
Neutrophils Relative %: 53 %
Platelets: 268 10*3/uL (ref 150–400)
RBC: 4.05 MIL/uL (ref 3.87–5.11)
RDW: 13.6 % (ref 11.5–15.5)
WBC: 8.3 10*3/uL (ref 4.0–10.5)
nRBC: 0 % (ref 0.0–0.2)

## 2019-04-10 LAB — COMPREHENSIVE METABOLIC PANEL
ALT: 22 U/L (ref 0–44)
AST: 17 U/L (ref 15–41)
Albumin: 3.6 g/dL (ref 3.5–5.0)
Alkaline Phosphatase: 205 U/L — ABNORMAL HIGH (ref 38–126)
Anion gap: 7 (ref 5–15)
BUN: 16 mg/dL (ref 6–20)
CO2: 24 mmol/L (ref 22–32)
Calcium: 8.6 mg/dL — ABNORMAL LOW (ref 8.9–10.3)
Chloride: 108 mmol/L (ref 98–111)
Creatinine, Ser: 0.72 mg/dL (ref 0.44–1.00)
GFR calc Af Amer: >60 mL/min (ref >60)
GFR calc non Af Amer: >60 mL/min (ref >60)
Glucose, Bld: 90 mg/dL (ref 70–99)
Potassium: 4.4 mmol/L (ref 3.5–5.1)
Sodium: 139 mmol/L (ref 135–145)
Total Bilirubin: 0.2 mg/dL — ABNORMAL LOW (ref 0.3–1.2)
Total Protein: 6.5 g/dL (ref 6.5–8.1)

## 2019-04-10 LAB — IRON AND TIBC
Iron: 79 ug/dL (ref 28–170)
Saturation Ratios: 30 % (ref 10.4–31.8)
TIBC: 264 ug/dL (ref 250–450)
UIBC: 185 ug/dL

## 2019-04-10 LAB — FOLATE: Folate: 6.1 ng/mL (ref >5.9)

## 2019-04-10 LAB — FERRITIN: Ferritin: 177 ng/mL (ref 11–307)

## 2019-04-10 LAB — VITAMIN B12: Vitamin B-12: 153 pg/mL — ABNORMAL LOW (ref 180–914)

## 2019-04-10 LAB — PHOSPHORUS: Phosphorus: 3.4 mg/dL (ref 2.5–4.6)

## 2019-04-11 LAB — VITAMIN D 25 HYDROXY (VIT D DEFICIENCY, FRACTURES): Vit D, 25-Hydroxy: 28.2 ng/mL — ABNORMAL LOW (ref 30.0–100.0)

## 2019-04-12 ENCOUNTER — Inpatient Hospital Stay (HOSPITAL_BASED_OUTPATIENT_CLINIC_OR_DEPARTMENT_OTHER): Payer: BC Managed Care – PPO | Admitting: Nurse Practitioner

## 2019-04-12 ENCOUNTER — Other Ambulatory Visit: Payer: Self-pay

## 2019-04-12 DIAGNOSIS — D508 Other iron deficiency anemias: Secondary | ICD-10-CM

## 2019-04-12 DIAGNOSIS — E538 Deficiency of other specified B group vitamins: Secondary | ICD-10-CM

## 2019-04-12 MED ORDER — INSULIN SYRINGES (DISPOSABLE) U-100 1 ML MISC: 6.0000 | 2 refills | Status: DC

## 2019-04-12 MED ORDER — CYANOCOBALAMIN 1000 MCG/ML IJ KIT: 1000.0000 ug | PACK | INTRAMUSCULAR | 3 refills | Status: DC

## 2019-04-12 NOTE — Progress Notes (Signed)
Virtual Visit via Telephone Note  I connected with Alejandra Barr on 04/12/19 at 11:00 AM EDT by telephone and verified that I am speaking with the correct person using two identifiers.   I discussed the limitations, risks, security and privacy concerns of performing an evaluation and management service by telephone and the availability of in person appointments. I also discussed with the patient that there may be a patient responsible charge related to this service. The patient expressed understanding and agreed to proceed.   History of Present Illness: -She is being seen for iron deficiency anemia.  She reports she has been doing better since her last Feraheme infusions.  Her energy has improved.  She denies any bright red bleeding per rectum or melena.  Her babies were born exactly 2 months ago today.  They are healthy and doing well. Denies any nausea, vomiting, or diarrhea. Denies any new pains. Had not noticed any recent bleeding such as epistaxis, hematuria or hematochezia. Denies recent chest pain on exertion, shortness of breath on minimal exertion, pre-syncopal episodes, or palpitations. Denies any numbness or tingling in hands or feet. Denies any recent fevers, infections, or recent hospitalizations. Patient reports appetite at 100% and energy level at 75%.  She is eating well and maintaining her weight at this time.    Observations/Objective: -Physical assessment deferred due to telephone visit. - She seemed alert and oriented and in no distress.  Assessment and Plan: 1.iron deficiency anemia: - It is due to malabsorption from gastric bypass surgery in 2006 and blood loss from menstruation. -She was given 1 infusion of Feraheme on 03/06/2019.  Her fatigue has improved. - She gave birth to her twins exactly 2 months ago today.  They are healthy and doing well. -Labs on 04/10/2019 showed her hemoglobin at 12.3, ferritin 177, percent saturation 30, and platelets 268. - We will see her  back in 3 months with repeat labs.  2.  B12 deficiency: - She was taking B12 injections every 2 weeks at home. -She ran out about a month ago. -Labs on 04/10/2019 showed her vitamin B12 level at 153. -We will call her in a new prescription for vitamin B12 injections monthly. -We will see her back in 3 months with repeat labs.  Follow Up Instructions: - Continue B12 injections monthly. -Follow-up in 3 months with repeat labs.   I discussed the assessment and treatment plan with the patient. The patient was provided an opportunity to ask questions and all were answered. The patient agreed with the plan and demonstrated an understanding of the instructions.   The patient was advised to call back or seek an in-person evaluation if the symptoms worsen or if the condition fails to improve as anticipated.  I provided 20 minutes of non-face-to-face time during this encounter. Video chat was not available for this patient.  Glennie Isle, NP-C

## 2019-04-12 NOTE — Patient Instructions (Signed)
[facility] at [facility] Discharge Instructions     Thank you for choosing  [facility] at [facility] to provide your oncology and hematology care.  To afford each patient quality time with our provider, please arrive at least 15 minutes before your scheduled appointment time.   If you have a lab appointment with the Cancer Center please come in thru the  Main Entrance and check in at the main information desk  You need to re-schedule your appointment should you arrive 10 or more minutes late.  We strive to give you quality time with our providers, and arriving late affects you and other patients whose appointments are after yours.  Also, if you no show three or more times for appointments you may be dismissed from the clinic at the providers discretion.     Again, thank you for choosing [facility].  Our hope is that these requests will decrease the amount of time that you wait before being seen by our physicians.       _____________________________________________________________  Should you have questions after your visit to [facility], please contact our office at [phone] between the hours of 8:00 a.m. and 4:30 p.m.  Voicemails left after 4:00 p.m. will not be returned until the following business day.  For prescription refill requests, have your pharmacy contact our office and allow 72 hours.    Cancer Center Support Programs:   > Cancer Support Group  2nd Tuesday of the month 1pm-2pm, Journey Room    

## 2019-07-07 ENCOUNTER — Inpatient Hospital Stay (HOSPITAL_COMMUNITY): Payer: BC Managed Care – PPO | Attending: Hematology

## 2019-07-07 DIAGNOSIS — Z87891 Personal history of nicotine dependence: Secondary | ICD-10-CM | POA: Insufficient documentation

## 2019-07-07 DIAGNOSIS — Z791 Long term (current) use of non-steroidal anti-inflammatories (NSAID): Secondary | ICD-10-CM | POA: Insufficient documentation

## 2019-07-07 DIAGNOSIS — Z23 Encounter for immunization: Secondary | ICD-10-CM | POA: Insufficient documentation

## 2019-07-07 DIAGNOSIS — J45909 Unspecified asthma, uncomplicated: Secondary | ICD-10-CM | POA: Insufficient documentation

## 2019-07-07 DIAGNOSIS — E669 Obesity, unspecified: Secondary | ICD-10-CM | POA: Insufficient documentation

## 2019-07-07 DIAGNOSIS — E538 Deficiency of other specified B group vitamins: Secondary | ICD-10-CM | POA: Insufficient documentation

## 2019-07-07 DIAGNOSIS — K909 Intestinal malabsorption, unspecified: Secondary | ICD-10-CM | POA: Insufficient documentation

## 2019-07-07 DIAGNOSIS — Z9884 Bariatric surgery status: Secondary | ICD-10-CM | POA: Insufficient documentation

## 2019-07-07 DIAGNOSIS — Z79899 Other long term (current) drug therapy: Secondary | ICD-10-CM | POA: Insufficient documentation

## 2019-07-07 DIAGNOSIS — D508 Other iron deficiency anemias: Secondary | ICD-10-CM | POA: Insufficient documentation

## 2019-07-07 DIAGNOSIS — E559 Vitamin D deficiency, unspecified: Secondary | ICD-10-CM | POA: Insufficient documentation

## 2019-07-13 ENCOUNTER — Inpatient Hospital Stay (HOSPITAL_COMMUNITY): Payer: BC Managed Care – PPO | Attending: Hematology

## 2019-07-13 ENCOUNTER — Other Ambulatory Visit: Payer: Self-pay

## 2019-07-13 DIAGNOSIS — E538 Deficiency of other specified B group vitamins: Secondary | ICD-10-CM | POA: Diagnosis not present

## 2019-07-13 DIAGNOSIS — K912 Postsurgical malabsorption, not elsewhere classified: Secondary | ICD-10-CM | POA: Insufficient documentation

## 2019-07-13 DIAGNOSIS — D508 Other iron deficiency anemias: Secondary | ICD-10-CM | POA: Insufficient documentation

## 2019-07-13 DIAGNOSIS — N920 Excessive and frequent menstruation with regular cycle: Secondary | ICD-10-CM | POA: Insufficient documentation

## 2019-07-13 DIAGNOSIS — Z9884 Bariatric surgery status: Secondary | ICD-10-CM | POA: Insufficient documentation

## 2019-07-13 LAB — COMPREHENSIVE METABOLIC PANEL
ALT: 12 U/L (ref 0–44)
AST: 14 U/L — ABNORMAL LOW (ref 15–41)
Albumin: 3.7 g/dL (ref 3.5–5.0)
Alkaline Phosphatase: 129 U/L — ABNORMAL HIGH (ref 38–126)
Anion gap: 7 (ref 5–15)
BUN: 11 mg/dL (ref 6–20)
CO2: 23 mmol/L (ref 22–32)
Calcium: 8.7 mg/dL — ABNORMAL LOW (ref 8.9–10.3)
Chloride: 106 mmol/L (ref 98–111)
Creatinine, Ser: 0.59 mg/dL (ref 0.44–1.00)
GFR calc Af Amer: >60 mL/min (ref >60)
GFR calc non Af Amer: >60 mL/min (ref >60)
Glucose, Bld: 93 mg/dL (ref 70–99)
Potassium: 4.1 mmol/L (ref 3.5–5.1)
Sodium: 136 mmol/L (ref 135–145)
Total Bilirubin: 0.3 mg/dL (ref 0.3–1.2)
Total Protein: 6.7 g/dL (ref 6.5–8.1)

## 2019-07-13 LAB — CBC WITH DIFFERENTIAL/PLATELET
Abs Immature Granulocytes: 0.04 10*3/uL (ref 0.00–0.07)
Basophils Absolute: 0.1 10*3/uL (ref 0.0–0.1)
Basophils Relative: 1 %
Eosinophils Absolute: 0.3 10*3/uL (ref 0.0–0.5)
Eosinophils Relative: 4 %
HCT: 38.1 % (ref 36.0–46.0)
Hemoglobin: 12.1 g/dL (ref 12.0–15.0)
Immature Granulocytes: 1 %
Lymphocytes Relative: 21 %
Lymphs Abs: 1.6 10*3/uL (ref 0.7–4.0)
MCH: 29.3 pg (ref 26.0–34.0)
MCHC: 31.8 g/dL (ref 30.0–36.0)
MCV: 92.3 fL (ref 80.0–100.0)
Monocytes Absolute: 0.5 10*3/uL (ref 0.1–1.0)
Monocytes Relative: 6 %
Neutro Abs: 5.2 10*3/uL (ref 1.7–7.7)
Neutrophils Relative %: 67 %
Platelets: 264 10*3/uL (ref 150–400)
RBC: 4.13 MIL/uL (ref 3.87–5.11)
RDW: 13.3 % (ref 11.5–15.5)
WBC: 7.8 10*3/uL (ref 4.0–10.5)
nRBC: 0 % (ref 0.0–0.2)

## 2019-07-13 LAB — IRON AND TIBC
Iron: 87 ug/dL (ref 28–170)
Saturation Ratios: 25 % (ref 10.4–31.8)
TIBC: 350 ug/dL (ref 250–450)
UIBC: 263 ug/dL

## 2019-07-13 LAB — FERRITIN: Ferritin: 86 ng/mL (ref 11–307)

## 2019-07-13 LAB — LACTATE DEHYDROGENASE: LDH: 139 U/L (ref 98–192)

## 2019-07-13 LAB — FOLATE: Folate: 5.8 ng/mL — ABNORMAL LOW (ref >5.9)

## 2019-07-13 LAB — VITAMIN B12: Vitamin B-12: 132 pg/mL — ABNORMAL LOW (ref 180–914)

## 2019-07-14 ENCOUNTER — Other Ambulatory Visit: Payer: Self-pay

## 2019-07-14 ENCOUNTER — Inpatient Hospital Stay (HOSPITAL_BASED_OUTPATIENT_CLINIC_OR_DEPARTMENT_OTHER): Payer: BC Managed Care – PPO | Admitting: Nurse Practitioner

## 2019-07-14 DIAGNOSIS — E559 Vitamin D deficiency, unspecified: Secondary | ICD-10-CM

## 2019-07-14 DIAGNOSIS — D508 Other iron deficiency anemias: Secondary | ICD-10-CM | POA: Diagnosis not present

## 2019-07-14 DIAGNOSIS — E538 Deficiency of other specified B group vitamins: Secondary | ICD-10-CM | POA: Diagnosis not present

## 2019-07-14 DIAGNOSIS — G43711 Chronic migraine without aura, intractable, with status migrainosus: Secondary | ICD-10-CM | POA: Diagnosis not present

## 2019-07-14 LAB — VITAMIN D 25 HYDROXY (VIT D DEFICIENCY, FRACTURES): Vit D, 25-Hydroxy: 20.6 ng/mL — ABNORMAL LOW (ref 30.0–100.0)

## 2019-07-14 MED ORDER — FOLIC ACID 800 MCG PO TABS: 800.0000 ug | ORAL_TABLET | Freq: Every day | ORAL | 3 refills | Status: DC

## 2019-07-14 MED ORDER — CYANOCOBALAMIN 1000 MCG/ML IJ KIT: 1000.0000 ug | PACK | INTRAMUSCULAR | 3 refills | Status: DC

## 2019-07-14 MED ORDER — ERGOCALCIFEROL 1.25 MG (50000 UT) PO CAPS: 50000.0000 [IU] | ORAL_CAPSULE | ORAL | 3 refills | Status: DC

## 2019-07-14 NOTE — Assessment & Plan Note (Signed)
1.  Iron deficiency anemia: -It is due to malabsorption from gastric bypass surgery in 2006 and blood loss from menstruation. - We gave her iron while she was pregnant with her twins.  They are now 81 months old and doing great. - She has tried oral iron supplementation in the past and was unable to tolerate or absorb it. - She last had IV iron on 02/20/2019 and 03/06/2019.  She reports her fatigue improved greatly. - Labs on 07/13/2019 showed her hemoglobin 12.1, ferritin 86, percent saturation 25. - She usually receives IV iron if her ferritin drops below 100.  Due to her becoming symptomatic. -We will set her up with 2 doses of IV Feraheme. -We will see her back in 3 months with repeat labs.  2.  Vitamin B12 deficiency: -She is taking B12 injections every month at home. -Labs on 07/14/2019 showed her B12 level at 132.  This is been decreased over the last few visits. -We will start B12 injections every 2 weeks at home. - We called her in a new prescription to her pharmacy. -We will see her back in 3 months with repeat labs.  3.  Folate deficiency: - Labs done on 07/14/2019 showed her folate is 5.8. -We called her in a prescription for folic acid daily. -We will recheck labs in 3 months.  4.  Vitamin D deficiency: -Labs done on 07/14/2019 showed her vitamin D level at 20.6. -We called her in a prescription for vitamin D 50,000 units weekly. -We will recheck labs in 3 months

## 2019-07-14 NOTE — Progress Notes (Signed)
[place], [place] [address]   CLINIC:  Medical Oncology/Hematology  PCP:  Fayrene Helper, MD [address], [address] [place] [address] [phone]   REASON FOR VISIT: Follow-up for iron deficiency anemia  CURRENT THERAPY: Intermittent iron infusions   INTERVAL HISTORY:  Alejandra Barr 33 y.o. female returns for routine follow-up for iron deficiency anemia.  Patient reports she has been feeling more fatigued lately.  She denies any bright red bleeding per rectum or melena. Denies any nausea, vomiting, or diarrhea. Denies any new pains. Had not noticed any recent bleeding such as epistaxis, hematuria or hematochezia. Denies recent chest pain on exertion, shortness of breath on minimal exertion, pre-syncopal episodes, or palpitations. Denies any numbness or tingling in hands or feet. Denies any recent fevers, infections, or recent hospitalizations. Patient reports appetite at 100% and energy level at 50%.  She is eating well maintaining her weight at this time.    REVIEW OF SYSTEMS:  Review of Systems  Constitutional: Positive for fatigue.  All other systems reviewed and are negative.    PAST MEDICAL/SURGICAL HISTORY:  Past Medical History:  Diagnosis Date  . Asthma   . Atrial flutter ([place])    Status post RFA by Dr. Lovena Le in 2004  . Chronic anxiety   . Chronic depression   . 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 Iron deficiency   . Iron deficiency anemia 10/02/2010   Qualifier: Diagnosis of  By: Moshe Cipro MD, Joycelyn Schmid    . Low vitamin B12 level 07/10/2013   Overview:  Last Assessment & Plan:  Being treated through hematology  . Newborn product of in vitro fertilization (IVF) pregnancy   . Obesity   . PONV (postoperative nausea and vomiting)    Past Surgical History:  Procedure Laterality Date  . Bilateral eustachian tube placement on 4 different occasions    . CARDIAC ELECTROPHYSIOLOGY MAPPING AND ABLATION    . CESAREAN SECTION  N/A 05/26/2017   Procedure: CESAREAN SECTION;  Surgeon: Linda Hedges, DO;  Location: [facility];  Service: Obstetrics;  Laterality: N/A;  . CESAREAN SECTION MULTI-GESTATIONAL N/A 02/10/2019   Procedure: Repeat CESAREAN SECTION MULTI-GESTATIONAL;  Surgeon: Brien Few, MD;  Location: [facility];  Service: Obstetrics;  Laterality: N/A;  EDD: 03/15/19 Allergy: Penicillin, Morphine, Cipro  . CHOLECYSTECTOMY  2003  . COLONOSCOPY N/A 11/29/2013   Procedure: COLONOSCOPY;  Surgeon: Rogene Houston, MD;  [facility]: [facility];  Service: Endoscopy;  Laterality: N/A;  120-rescheduled to 300 Ann notified pt  . GASTRIC BYPASS  2006  . TONSILLECTOMY  1990     SOCIAL HISTORY:  Social History   Socioeconomic History  . Marital status: Married    Spouse name: Not on file  . Number of children: Not on file  . Years of education: Not on file  . Highest education level: Not on file  Occupational History  . Occupation: Hydrographic surveyor: [employer]  Social Needs  . Financial resource strain: Not hard at all  . Food insecurity    Worry: Never true    Inability: Never true  . Transportation needs    Medical: No    Non-medical: Not on file  Tobacco Use  . Smoking status: Former Smoker    Packs/day: 0.50    Types: Cigarettes    Start date: 03/21/2013    Quit date: 06/02/2015    Years since quitting: 4.1  . Smokeless tobacco: Never Used  Substance and Sexual Activity  . Alcohol use: No    Alcohol/week: 0.0 standard drinks    Comment: sober alcoholic 6 years  . Drug use: No  . Sexual activity: Not Currently    Birth control/protection: None    Comment: husband had vasectomy  Lifestyle  . Physical activity    Days per week: Not on file    Minutes per session: Not on file  . Stress: Not on file  Relationships  . Social Herbalist on phone: Not on file    Gets together: Not on file    Attends religious service: Not on file    Active member of club or organization: Not on  file    Attends meetings of clubs or organizations: Not on file    Relationship status: Not on file  . Intimate partner violence    Fear of current or ex partner: Not on file    Emotionally abused: Not on file    Physically abused: Not on file    Forced sexual activity: Not on file  Other Topics Concern  . Not on file  Social History Narrative  . Not on file    FAMILY HISTORY:  Family History  Problem Relation Age of Onset  . Fibromyalgia Mother        Chronic pelvic pain   . Hypothyroidism Mother   . Obesity Mother   . Heart defect Mother        Supraventricular, tachycardia /tachypalpation   . Arrhythmia Mother   . Breast cancer Mother 49  . Hypertension Maternal Grandmother   . Diabetes Maternal Grandmother   . Hypertension Maternal Grandfather   . Heart disease Paternal Grandmother   . Hypertension Paternal Grandmother   . Heart disease Paternal Grandfather   . Hypertension Paternal Grandfather     CURRENT MEDICATIONS:  Outpatient Encounter Medications as of 07/14/2019  Medication Sig Note  . Cyanocobalamin 1000 MCG/ML KIT Inject 1,000 mcg into the muscle every 14 (fourteen) days.   . ergocalciferol (VITAMIN D2) 1.25 MG (50000 UT) capsule Take 1 capsule (50,000 Units total) by mouth once a week.   . ferrous sulfate 300 (60 Fe) MG/5ML syrup Take 5 mLs (300 mg total) by mouth 2 (two) times daily with a meal. Mix in 4oz of orange juice   . folic acid (FOLVITE) 620 MCG tablet Take 1 tablet (800 mcg total) by mouth daily.   Marland Kitchen ibuprofen (ADVIL,MOTRIN) 800 MG tablet Take 1 tablet (800 mg total) by mouth every 6 (six) hours.   . Insulin Syringes, Disposable, U-100 1 ML MISC 6 Syringes by Does not apply route every 30 (thirty) days.   Marland Kitchen levothyroxine (SYNTHROID, LEVOTHROID) 50 MCG tablet Take 50-100 mcg by mouth See admin instructions. Take 1 tablet (50 mcg) by mouth on Mondays, Wednesdays, Thursdays, Fridays, & Sundays in the mornings before breakfast. Take 2 tablets (100 mcg)  by mouth on Tuesdays & Saturdays in the morning before breakfast.   . magnesium oxide (MAG-OX) 400 (241.3 Mg) MG tablet Take 1 tablet (400 mg total) by mouth daily.   . metoprolol succinate (TOPROL-XL) 25 MG 24 hr tablet TAKE ONE AND ONE-HALF TABLETS BY MOUTH DAILY (Patient taking differently: Take 12.5-25 mg by mouth See admin instructions. Take 1 tablet (25 mg) by mouth in the morning & take 0.5 tablet (12.5 mg) by mouth at 3 pm.) 02/19/2019: Did not pick up from pharm  . misoprostol (CYTOTEC) 200 MCG tablet PLACE 4 TS IN CHEEK  POUCH AND LEAVE FOR 1 HOUR AND THEN SWALLOW WITH WATER. REPEAT AFTER 8 H IF HEAVY BLEEDING CONTINUES   . Prenatal Vit-Fe Fumarate-FA (PRENATAL PO) Take 1 tablet by mouth daily.   Marland Kitchen senna-docusate (SENOKOT-S) 8.6-50 MG tablet Take 2 tablets by mouth daily.   . [DISCONTINUED] Cyanocobalamin 1000 MCG/ML KIT Inject 1,000 mcg into the muscle every 30 (thirty) days.   . [DISCONTINUED] folic acid (FOLVITE) 048 MCG tablet Take 800 mcg by mouth daily.    No facility-administered encounter medications on file as of 07/14/2019.     ALLERGIES:  Allergies  Allergen Reactions  . Ciprofloxacin Anaphylaxis  . Morphine And Related Anaphylaxis  . Penicillins Anaphylaxis    Has patient had a PCN reaction causing immediate rash, facial/tongue/throat swelling, SOB or lightheadedness with hypotension: No Has patient had a PCN reaction causing severe rash involving mucus membranes or skin necrosis: No Has patient had a PCN reaction that required hospitalization: No Has patient had a PCN reaction occurring within the last 10 years: No If all of the above answers are "NO", then may proceed with Cephalosporin use.     PHYSICAL EXAM:  ECOG Performance status: 1    Vital sign: - Vital signs deferred due to telephone visit.   Physical Exam Physical examination deferred due to telephone visit Patient was alert and oriented and in no distress.  LABORATORY DATA:  I have reviewed the  labs as listed.  CBC    Component Value Date/Time   WBC 7.8 07/13/2019 0858   RBC 4.13 07/13/2019 0858   HGB 12.1 07/13/2019 0858   HCT 38.1 07/13/2019 0858   PLT 264 07/13/2019 0858   MCV 92.3 07/13/2019 0858   MCH 29.3 07/13/2019 0858   MCHC 31.8 07/13/2019 0858   RDW 13.3 07/13/2019 0858   LYMPHSABS 1.6 07/13/2019 0858   MONOABS 0.5 07/13/2019 0858   EOSABS 0.3 07/13/2019 0858   BASOSABS 0.1 07/13/2019 0858   CMP Latest Ref Rng & Units 07/13/2019 04/10/2019 02/11/2019  Glucose 70 - 99 mg/dL 93 90 79  BUN 6 - 20 mg/dL _0 Creatinine 0.44 - 1.00 mg/dL 0.59 0.72 0.78  Sodium 135 - 145 mmol/L 136 139 138  Potassium 3.5 - 5.1 mmol/L 4.1 4.4 4.1  Chloride 98 - 111 mmol/L 106 108 110  CO2 22 - 32 mmol/L 23 24 19(L)  Calcium 8.9 - 10.3 mg/dL 8.7(L) 8.6(L) 8.5(L)  Total Protein 6.5 - 8.1 g/dL 6.7 6.5 4.3(L)  Total Bilirubin 0.3 - 1.2 mg/dL 0.3 0.2(L) 0.3  Alkaline Phos 38 - 126 U/L 129(H) 205(H) 129(H)  AST 15 - 41 U/L 14(L) 17 16  ALT 0 - 44 U/L _1 All questions were answered to patient's stated satisfaction. Encouraged patient to call with any new concerns or questions before his next visit to the cancer center and we can certain see him sooner, if needed.     ASSESSMENT & PLAN:   Iron deficiency anemia 1.  Iron deficiency anemia: -It is due to malabsorption from gastric bypass surgery in 2006 and blood loss from menstruation. - We gave her iron while she was pregnant with her twins.  They are now 40 months old and doing great. - She has tried oral iron supplementation in the past and was unable to tolerate or absorb it. - She last had IV iron on 02/20/2019 and 03/06/2019.  She reports her fatigue improved greatly. - Labs on 07/13/2019 showed her hemoglobin 12.1, ferritin  86, percent saturation 25. - She usually receives IV iron if her ferritin drops below 100.  Due to her becoming symptomatic. -We will set her up with 2 doses of IV Feraheme. -We will see her back in 3  months with repeat labs.  2.  Vitamin B12 deficiency: -She is taking B12 injections every month at home. -Labs on 07/14/2019 showed her B12 level at 132.  This is been decreased over the last few visits. -We will start B12 injections every 2 weeks at home. - We called her in a new prescription to her pharmacy. -We will see her back in 3 months with repeat labs.  3.  Folate deficiency: - Labs done on 07/14/2019 showed her folate is 5.8. -We called her in a prescription for folic acid daily. -We will recheck labs in 3 months.  4.  Vitamin D deficiency: -Labs done on 07/14/2019 showed her vitamin D level at 20.6. -We called her in a prescription for vitamin D 50,000 units weekly. -We will recheck labs in 3 months      Orders placed this encounter:  Orders Placed This Encounter  Procedures  . Lactate dehydrogenase  . CBC with Differential/Platelet  . Comprehensive metabolic panel  . Ferritin  . Iron and TIBC  . Vitamin B12  . VITAMIN D 25 Hydroxy (Vit-D Deficiency, Fractures)  . Folate      Francene Finders, FNP-C [place] [phone]

## 2019-07-14 NOTE — Patient Instructions (Signed)
[facility] at [facility] Discharge Instructions     Thank you for choosing [facility] at [facility] to provide your oncology and hematology care.  To afford each patient quality time with our provider, please arrive at least 15 minutes before your scheduled appointment time.   If you have a lab appointment with the Cancer Center please come in thru the Main Entrance and check in at the main information desk.  You need to re-schedule your appointment should you arrive 10 or more minutes late.  We strive to give you quality time with our providers, and arriving late affects you and other patients whose appointments are after yours.  Also, if you no show three or more times for appointments you may be dismissed from the clinic at the providers discretion.     Again, thank you for choosing [facility].  Our hope is that these requests will decrease the amount of time that you wait before being seen by our physicians.       _____________________________________________________________  Should you have questions after your visit to  Cancer Center, please contact our office at [phone] between the hours of 8:00 a.m. and 4:30 p.m.  Voicemails left after 4:00 p.m. will not be returned until the following business day.  For prescription refill requests, have your pharmacy contact our office and allow 72 hours.    Due to Covid, you will need to wear a mask upon entering the hospital. If you do not have a mask, a mask will be given to you at the Main Entrance upon arrival. For doctor visits, patients may have 1 support person with them. For treatment visits, patients can not have anyone with them due to social distancing guidelines and our immunocompromised population.      

## 2019-07-16 ENCOUNTER — Other Ambulatory Visit: Payer: Self-pay | Admitting: Cardiovascular Disease

## 2019-07-20 ENCOUNTER — Other Ambulatory Visit: Payer: Self-pay

## 2019-07-20 ENCOUNTER — Inpatient Hospital Stay (HOSPITAL_COMMUNITY): Payer: BC Managed Care – PPO

## 2019-07-20 VITALS — BP 110/66 | HR 70 | Temp 96.8°F | Resp 16

## 2019-07-20 DIAGNOSIS — Z87891 Personal history of nicotine dependence: Secondary | ICD-10-CM | POA: Diagnosis not present

## 2019-07-20 DIAGNOSIS — E538 Deficiency of other specified B group vitamins: Secondary | ICD-10-CM | POA: Diagnosis not present

## 2019-07-20 DIAGNOSIS — Z9884 Bariatric surgery status: Secondary | ICD-10-CM | POA: Diagnosis not present

## 2019-07-20 DIAGNOSIS — D508 Other iron deficiency anemias: Secondary | ICD-10-CM

## 2019-07-20 DIAGNOSIS — E669 Obesity, unspecified: Secondary | ICD-10-CM | POA: Diagnosis not present

## 2019-07-20 DIAGNOSIS — E559 Vitamin D deficiency, unspecified: Secondary | ICD-10-CM | POA: Diagnosis not present

## 2019-07-20 DIAGNOSIS — J45909 Unspecified asthma, uncomplicated: Secondary | ICD-10-CM | POA: Diagnosis not present

## 2019-07-20 DIAGNOSIS — Z79899 Other long term (current) drug therapy: Secondary | ICD-10-CM | POA: Diagnosis not present

## 2019-07-20 DIAGNOSIS — Z791 Long term (current) use of non-steroidal anti-inflammatories (NSAID): Secondary | ICD-10-CM | POA: Diagnosis not present

## 2019-07-20 DIAGNOSIS — K909 Intestinal malabsorption, unspecified: Secondary | ICD-10-CM | POA: Diagnosis not present

## 2019-07-20 DIAGNOSIS — Z23 Encounter for immunization: Secondary | ICD-10-CM | POA: Diagnosis not present

## 2019-07-20 MED ORDER — SODIUM CHLORIDE 0.9 % IV SOLN
510.0000 mg | Freq: Once | INTRAVENOUS | Status: AC
  Administered 2019-07-20: 510 mg via INTRAVENOUS
  Filled 2019-07-20: qty 510

## 2019-07-20 MED ORDER — SODIUM CHLORIDE 0.9 % IV SOLN
Freq: Once | INTRAVENOUS | Status: AC
  Administered 2019-07-20: 14:00:00 via INTRAVENOUS

## 2019-07-20 MED ORDER — INFLUENZA VAC SPLIT QUAD 0.5 ML IM SUSY
0.5000 mL | PREFILLED_SYRINGE | Freq: Once | INTRAMUSCULAR | Status: AC
  Administered 2019-07-20: 0.5 mL via INTRAMUSCULAR
  Filled 2019-07-20: qty 0.5

## 2019-07-20 NOTE — Progress Notes (Signed)
Feraheme given per orders. Patient tolerated it well without problems. Vitals stable and discharged home from clinic ambulatory. Follow up as scheduled.  

## 2019-07-27 ENCOUNTER — Inpatient Hospital Stay (HOSPITAL_COMMUNITY): Payer: BC Managed Care – PPO

## 2019-07-27 ENCOUNTER — Other Ambulatory Visit: Payer: Self-pay

## 2019-07-27 VITALS — BP 138/80 | HR 97 | Temp 97.8°F | Resp 16

## 2019-07-27 DIAGNOSIS — E538 Deficiency of other specified B group vitamins: Secondary | ICD-10-CM | POA: Diagnosis not present

## 2019-07-27 DIAGNOSIS — Z23 Encounter for immunization: Secondary | ICD-10-CM | POA: Diagnosis not present

## 2019-07-27 DIAGNOSIS — E669 Obesity, unspecified: Secondary | ICD-10-CM | POA: Diagnosis not present

## 2019-07-27 DIAGNOSIS — E559 Vitamin D deficiency, unspecified: Secondary | ICD-10-CM | POA: Diagnosis not present

## 2019-07-27 DIAGNOSIS — J45909 Unspecified asthma, uncomplicated: Secondary | ICD-10-CM | POA: Diagnosis not present

## 2019-07-27 DIAGNOSIS — Z791 Long term (current) use of non-steroidal anti-inflammatories (NSAID): Secondary | ICD-10-CM | POA: Diagnosis not present

## 2019-07-27 DIAGNOSIS — Z79899 Other long term (current) drug therapy: Secondary | ICD-10-CM | POA: Diagnosis not present

## 2019-07-27 DIAGNOSIS — Z87891 Personal history of nicotine dependence: Secondary | ICD-10-CM | POA: Diagnosis not present

## 2019-07-27 DIAGNOSIS — K909 Intestinal malabsorption, unspecified: Secondary | ICD-10-CM | POA: Diagnosis not present

## 2019-07-27 DIAGNOSIS — D508 Other iron deficiency anemias: Secondary | ICD-10-CM | POA: Diagnosis not present

## 2019-07-27 DIAGNOSIS — Z9884 Bariatric surgery status: Secondary | ICD-10-CM | POA: Diagnosis not present

## 2019-07-27 MED ORDER — SODIUM CHLORIDE 0.9 % IV SOLN
510.0000 mg | Freq: Once | INTRAVENOUS | Status: AC
  Administered 2019-07-27: 510 mg via INTRAVENOUS
  Filled 2019-07-27: qty 510

## 2019-07-27 MED ORDER — SODIUM CHLORIDE 0.9 % IV SOLN
Freq: Once | INTRAVENOUS | Status: AC
  Administered 2019-07-27: 14:00:00 via INTRAVENOUS

## 2019-07-27 NOTE — Progress Notes (Signed)
Alejandra Barr presents today for IV iron infusion. Infusion tolerated without incident or complaint. See MAR for details. VSS prior to and post infusion. Discharged in satisfactory condition with follow up instructions.

## 2019-07-27 NOTE — Patient Instructions (Signed)
[facility] at [facility]  Discharge Instructions:  Today you received a Feraheme infusion. Follow up as scheduled. _______________________________________________________________  Thank you for choosing [facility] at [facility] to provide your oncology and hematology care.  To afford each patient quality time with our providers, please arrive at least 15 minutes before your scheduled appointment.  You need to re-schedule your appointment if you arrive 10 or more minutes late.  We strive to give you quality time with our providers, and arriving late affects you and other patients whose appointments are after yours.  Also, if you no show three or more times for appointments you may be dismissed from the clinic.  Again, thank you for choosing [facility] hope is that these requests will allow you access to exceptional care and in a timely manner. _______________________________________________________________  If you have questions after your visit, please contact our office at [phone] between the hours of 8:30 a.m. and 5:00 p.m. Voicemails left after 4:30 p.m. will not be returned until the following business day. _______________________________________________________________  For prescription refill requests, have your pharmacy contact our office. _______________________________________________________________  Recommendations made by the consultant and any test results will be sent to your referring physician. _______________________________________________________________

## 2019-07-28 MED ORDER — OCTREOTIDE ACETATE 30 MG IM KIT
PACK | INTRAMUSCULAR | Status: AC
  Filled 2019-07-28: qty 1

## 2019-07-28 MED ORDER — CYANOCOBALAMIN 1000 MCG/ML IJ SOLN
INTRAMUSCULAR | Status: AC
  Filled 2019-07-28: qty 1

## 2019-08-28 DIAGNOSIS — Z319 Encounter for procreative management, unspecified: Secondary | ICD-10-CM | POA: Diagnosis not present

## 2019-10-05 ENCOUNTER — Other Ambulatory Visit: Payer: Self-pay

## 2019-10-05 ENCOUNTER — Inpatient Hospital Stay (HOSPITAL_COMMUNITY): Payer: BC Managed Care – PPO | Attending: Hematology

## 2019-10-05 DIAGNOSIS — D509 Iron deficiency anemia, unspecified: Secondary | ICD-10-CM | POA: Insufficient documentation

## 2019-10-05 DIAGNOSIS — E538 Deficiency of other specified B group vitamins: Secondary | ICD-10-CM | POA: Insufficient documentation

## 2019-10-05 DIAGNOSIS — E559 Vitamin D deficiency, unspecified: Secondary | ICD-10-CM | POA: Insufficient documentation

## 2019-10-05 DIAGNOSIS — D508 Other iron deficiency anemias: Secondary | ICD-10-CM

## 2019-10-05 LAB — CBC WITH DIFFERENTIAL/PLATELET
Abs Immature Granulocytes: 0.03 10*3/uL (ref 0.00–0.07)
Basophils Absolute: 0.1 10*3/uL (ref 0.0–0.1)
Basophils Relative: 1 %
Eosinophils Absolute: 0.2 10*3/uL (ref 0.0–0.5)
Eosinophils Relative: 3 %
HCT: 41.4 % (ref 36.0–46.0)
Hemoglobin: 13.3 g/dL (ref 12.0–15.0)
Immature Granulocytes: 0 %
Lymphocytes Relative: 22 %
Lymphs Abs: 1.9 10*3/uL (ref 0.7–4.0)
MCH: 29.9 pg (ref 26.0–34.0)
MCHC: 32.1 g/dL (ref 30.0–36.0)
MCV: 93 fL (ref 80.0–100.0)
Monocytes Absolute: 0.5 10*3/uL (ref 0.1–1.0)
Monocytes Relative: 6 %
Neutro Abs: 5.8 10*3/uL (ref 1.7–7.7)
Neutrophils Relative %: 68 %
Platelets: 275 10*3/uL (ref 150–400)
RBC: 4.45 MIL/uL (ref 3.87–5.11)
RDW: 13.1 % (ref 11.5–15.5)
WBC: 8.6 10*3/uL (ref 4.0–10.5)
nRBC: 0 % (ref 0.0–0.2)

## 2019-10-05 LAB — LACTATE DEHYDROGENASE: LDH: 143 U/L (ref 98–192)

## 2019-10-05 LAB — IRON AND TIBC
Iron: 95 ug/dL (ref 28–170)
Saturation Ratios: 29 % (ref 10.4–31.8)
TIBC: 324 ug/dL (ref 250–450)
UIBC: 229 ug/dL

## 2019-10-05 LAB — COMPREHENSIVE METABOLIC PANEL
ALT: 9 U/L (ref 0–44)
AST: 12 U/L — ABNORMAL LOW (ref 15–41)
Albumin: 3.8 g/dL (ref 3.5–5.0)
Alkaline Phosphatase: 128 U/L — ABNORMAL HIGH (ref 38–126)
Anion gap: 10 (ref 5–15)
BUN: 10 mg/dL (ref 6–20)
CO2: 23 mmol/L (ref 22–32)
Calcium: 8.9 mg/dL (ref 8.9–10.3)
Chloride: 105 mmol/L (ref 98–111)
Creatinine, Ser: 0.63 mg/dL (ref 0.44–1.00)
GFR calc Af Amer: >60 mL/min (ref >60)
GFR calc non Af Amer: >60 mL/min (ref >60)
Glucose, Bld: 85 mg/dL (ref 70–99)
Potassium: 3.9 mmol/L (ref 3.5–5.1)
Sodium: 138 mmol/L (ref 135–145)
Total Bilirubin: 0.4 mg/dL (ref 0.3–1.2)
Total Protein: 6.8 g/dL (ref 6.5–8.1)

## 2019-10-05 LAB — VITAMIN B12: Vitamin B-12: 118 pg/mL — ABNORMAL LOW (ref 180–914)

## 2019-10-05 LAB — FERRITIN: Ferritin: 203 ng/mL (ref 11–307)

## 2019-10-05 LAB — FOLATE: Folate: 5.7 ng/mL — ABNORMAL LOW

## 2019-10-05 LAB — VITAMIN D 25 HYDROXY (VIT D DEFICIENCY, FRACTURES): Vit D, 25-Hydroxy: 19.98 ng/mL — ABNORMAL LOW (ref 30–100)

## 2019-10-12 ENCOUNTER — Inpatient Hospital Stay (HOSPITAL_BASED_OUTPATIENT_CLINIC_OR_DEPARTMENT_OTHER): Payer: BC Managed Care – PPO | Admitting: Nurse Practitioner

## 2019-10-12 DIAGNOSIS — E538 Deficiency of other specified B group vitamins: Secondary | ICD-10-CM

## 2019-10-12 DIAGNOSIS — D508 Other iron deficiency anemias: Secondary | ICD-10-CM | POA: Diagnosis not present

## 2019-10-12 MED ORDER — CYANOCOBALAMIN 1000 MCG/ML IJ KIT: 1000.0000 ug | PACK | INTRAMUSCULAR | 4 refills | Status: DC

## 2019-10-12 MED ORDER — INSULIN SYRINGES (DISPOSABLE) U-100 1 ML MISC: 1.0000 mL | 1 refills | Status: DC

## 2019-10-12 NOTE — Progress Notes (Signed)
[place], [place] [address]   CLINIC:  Medical Oncology/Hematology  PCP:  Fayrene Helper, MD [address] [place] [address] [phone]   REASON FOR VISIT: Follow-up for iron deficiency anemia  CURRENT THERAPY: Intermittent iron infusions   INTERVAL HISTORY:  Alejandra Barr 33 y.o. female was called for a telephone visit for iron deficiency anemia.  She is doing well since her last visit.  She reports the iron infusions helped her fatigue.  She is feeling better.  She reports she is doing the B12 injections every 3 weeks.  She denies any bright red bleeding per rectum or melena.  She denies any easy bruising or bleeding. Denies any nausea, vomiting, or diarrhea. Denies any new pains. Had not noticed any recent bleeding such as epistaxis, hematuria or hematochezia. Denies recent chest pain on exertion, shortness of breath on minimal exertion, pre-syncopal episodes, or palpitations. Denies any numbness or tingling in hands or feet. Denies any recent fevers, infections, or recent hospitalizations. Patient reports appetite at 100% and energy level at 75%.  She is eating well maintaining her weight at this time.     REVIEW OF SYSTEMS:  Review of Systems  All other systems reviewed and are negative.    PAST MEDICAL/SURGICAL HISTORY:  Past Medical History:  Diagnosis Date  . Asthma   . Atrial flutter ([place])    Status post RFA by Dr. Lovena Le in 2004  . Chronic anxiety   . Chronic depression   . 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 Iron deficiency   . Iron deficiency anemia 10/02/2010   Qualifier: Diagnosis of  By: Moshe Cipro MD, Joycelyn Schmid    . Low vitamin B12 level 07/10/2013   Overview:  Last Assessment & Plan:  Being treated through hematology  . Newborn product of in vitro fertilization (IVF) pregnancy   . Obesity   . PONV (postoperative nausea and vomiting)    Past Surgical History:  Procedure Laterality Date  . Bilateral  eustachian tube placement on 4 different occasions    . CARDIAC ELECTROPHYSIOLOGY MAPPING AND ABLATION    . CESAREAN SECTION N/A 05/26/2017   Procedure: CESAREAN SECTION;  Surgeon: Linda Hedges, DO;  Location: [facility];  Service: Obstetrics;  Laterality: N/A;  . CESAREAN SECTION MULTI-GESTATIONAL N/A 02/10/2019   Procedure: Repeat CESAREAN SECTION MULTI-GESTATIONAL;  Surgeon: Brien Few, MD;  Location: [facility];  Service: Obstetrics;  Laterality: N/A;  EDD: 03/15/19 Allergy: Penicillin, Morphine, Cipro  . CHOLECYSTECTOMY  2003  . COLONOSCOPY N/A 11/29/2013   Procedure: COLONOSCOPY;  Surgeon: Rogene Houston, MD;  [facility];  Service: Endoscopy;  Laterality: N/A;  120-rescheduled to [address] notified pt  . GASTRIC BYPASS  2006  . TONSILLECTOMY  1990     SOCIAL HISTORY:  Social History   Socioeconomic History  . Marital status: Married    Spouse name: Not on file  . Number of children: Not on file  . Years of education: Not on file  . Highest education level: Not on file  Occupational History  . Occupation: Nurse     Employer: [employer]  Tobacco Use  . Smoking status: Former Smoker    Packs/day: 0.50    Types: Cigarettes    Start date: 03/21/2013    Quit date: 06/02/2015    Years since quitting: 4.3  . Smokeless tobacco: Never Used  Substance and Sexual Activity  . Alcohol use: No  Alcohol/week: 0.0 standard drinks    Comment: sober alcoholic 6 years  . Drug use: No  . Sexual activity: Not Currently    Birth control/protection: None    Comment: husband had vasectomy  Other Topics Concern  . Not on file  Social History Narrative  . Not on file   Social Determinants of Health   Financial Resource Strain: Low Risk   . Difficulty of Paying Living Expenses: Not hard at all  Food Insecurity: No Food Insecurity  . Worried About Charity fundraiser in the Last Year: Never true  . Ran Out of Food in the Last Year: Never true  Transportation Needs:  Unknown  . Lack of Transportation (Medical): No  . Lack of Transportation (Non-Medical): Not on file  Physical Activity:   . Days of Exercise per Week: Not on file  . Minutes of Exercise per Session: Not on file  Stress:   . Feeling of Stress : Not on file  Social Connections:   . Frequency of Communication with Friends and Family: Not on file  . Frequency of Social Gatherings with Friends and Family: Not on file  . Attends Religious Services: Not on file  . Active Member of Clubs or Organizations: Not on file  . Attends Archivist Meetings: Not on file  . Marital Status: Not on file  Intimate Partner Violence:   . Fear of Current or Ex-Partner: Not on file  . Emotionally Abused: Not on file  . Physically Abused: Not on file  . Sexually Abused: Not on file    FAMILY HISTORY:  Family History  Problem Relation Age of Onset  . Fibromyalgia Mother        Chronic pelvic pain   . Hypothyroidism Mother   . Obesity Mother   . Heart defect Mother        Supraventricular, tachycardia /tachypalpation   . Arrhythmia Mother   . Breast cancer Mother 46  . Hypertension Maternal Grandmother   . Diabetes Maternal Grandmother   . Hypertension Maternal Grandfather   . Heart disease Paternal Grandmother   . Hypertension Paternal Grandmother   . Heart disease Paternal Grandfather   . Hypertension Paternal Grandfather     CURRENT MEDICATIONS:  Outpatient Encounter Medications as of 10/12/2019  Medication Sig  . butalbital-acetaminophen-caffeine (FIORICET) 50-325-40 MG tablet TK 2 TS PO ONCE A DAY PRN  . cyanocobalamin (,VITAMIN B-12,) 1000 MCG/ML injection   . Cyanocobalamin 1000 MCG/ML KIT Inject 1,000 mcg into the muscle every 7 (seven) days.  . ergocalciferol (VITAMIN D2) 1.25 MG (50000 UT) capsule Take 1 capsule (50,000 Units total) by mouth once a week.  . estradiol (ESTRACE) 2 MG tablet Take by mouth.  . ferrous sulfate 300 (60 Fe) MG/5ML syrup Take 5 mLs (300 mg total) by  mouth 2 (two) times daily with a meal. Mix in 4oz of orange juice  . folic acid (FOLVITE) 035 MCG tablet Take 1 tablet (800 mcg total) by mouth daily.  Marland Kitchen ibuprofen (ADVIL,MOTRIN) 800 MG tablet Take 1 tablet (800 mg total) by mouth every 6 (six) hours.  . Insulin Syringes, Disposable, U-100 1 ML MISC 6 Syringes by Does not apply route every 30 (thirty) days.  . Insulin Syringes, Disposable, U-100 1 ML MISC 1 mL by Does not apply route every 7 (seven) days.  Marland Kitchen levothyroxine (SYNTHROID, LEVOTHROID) 50 MCG tablet Take 50-100 mcg by mouth See admin instructions. Take 1 tablet (50 mcg) by mouth on Mondays, Wednesdays, Thursdays, Fridays, &  Sundays in the mornings before breakfast. Take 2 tablets (100 mcg) by mouth on Tuesdays & Saturdays in the morning before breakfast.  . magnesium oxide (MAG-OX) 400 (241.3 Mg) MG tablet Take 1 tablet (400 mg total) by mouth daily.  . metoprolol succinate (TOPROL-XL) 25 MG 24 hr tablet TAKE ONE AND ONE-HALF TABLETS BY MOUTH DAILY  . misoprostol (CYTOTEC) 200 MCG tablet PLACE 4 TS IN CHEEK POUCH AND LEAVE FOR 1 HOUR AND THEN SWALLOW WITH WATER. REPEAT AFTER 8 H IF HEAVY BLEEDING CONTINUES  . Prenatal Vit-Fe Fumarate-FA (PRENATAL PO) Take 1 tablet by mouth daily.  Marland Kitchen senna-docusate (SENOKOT-S) 8.6-50 MG tablet Take 2 tablets by mouth daily.  Marland Kitchen topiramate (TOPAMAX) 100 MG tablet TK 1 T PO BID  . topiramate (TOPAMAX) 50 MG tablet TK 1 T PO AT BEDTIME  . [DISCONTINUED] Cyanocobalamin 1000 MCG/ML KIT Inject 1,000 mcg into the muscle every 14 (fourteen) days.   No facility-administered encounter medications on file as of 10/12/2019.    ALLERGIES:  Allergies  Allergen Reactions  . Ciprofloxacin Anaphylaxis  . Morphine And Related Anaphylaxis  . Penicillins Anaphylaxis    Has patient had a PCN reaction causing immediate rash, facial/tongue/throat swelling, SOB or lightheadedness with hypotension: No Has patient had a PCN reaction causing severe rash involving mucus  membranes or skin necrosis: No Has patient had a PCN reaction that required hospitalization: No Has patient had a PCN reaction occurring within the last 10 years: No If all of the above answers are "NO", then may proceed with Cephalosporin use.     Vital signs: -Deferred due to telephone visit   Physical Exam -Deferred due to telephone visit -Patient was alert and oriented over the phone and in no acute distress.  LABORATORY DATA:  I have reviewed the labs as listed.  CBC    Component Value Date/Time   WBC 8.6 10/05/2019 1105   RBC 4.45 10/05/2019 1105   HGB 13.3 10/05/2019 1105   HCT 41.4 10/05/2019 1105   PLT 275 10/05/2019 1105   MCV 93.0 10/05/2019 1105   MCH 29.9 10/05/2019 1105   MCHC 32.1 10/05/2019 1105   RDW 13.1 10/05/2019 1105   LYMPHSABS 1.9 10/05/2019 1105   MONOABS 0.5 10/05/2019 1105   EOSABS 0.2 10/05/2019 1105   BASOSABS 0.1 10/05/2019 1105   CMP Latest Ref Rng & Units 10/05/2019 07/13/2019 04/10/2019  Glucose 70 - 99 mg/dL 85 93 90  BUN 6 - 20 mg/dL 10 11 16   Creatinine 0.44 - 1.00 mg/dL 0.63 0.59 0.72  Sodium 135 - 145 mmol/L 138 136 139  Potassium 3.5 - 5.1 mmol/L 3.9 4.1 4.4  Chloride 98 - 111 mmol/L 105 106 108  CO2 22 - 32 mmol/L 23 23 24   Calcium 8.9 - 10.3 mg/dL 8.9 8.7(L) 8.6(L)  Total Protein 6.5 - 8.1 g/dL 6.8 6.7 6.5  Total Bilirubin 0.3 - 1.2 mg/dL 0.4 0.3 0.2(L)  Alkaline Phos 38 - 126 U/L 128(H) 129(H) 205(H)  AST 15 - 41 U/L 12(L) 14(L) 17  ALT 0 - 44 U/L 9 12 22    All questions were answered to patient's stated satisfaction. Encouraged patient to call with any new concerns or questions before his next visit to the cancer center and we can certain see him sooner, if needed.       ASSESSMENT & PLAN:   Iron deficiency anemia 1.  Iron deficiency anemia: -It is due to malabsorption from gastric bypass surgery in 2006 and blood loss from menstruation. -  We gave her iron while she was pregnant with her twins.  They are now 66 months old  and doing great. - She has tried oral iron supplementation in the past and was unable to tolerate or absorb it. - She last had IV iron on 07/20/2019 and 07/27/2019.  She reports her fatigue improved greatly. - Labs on 10/05/2019 showed her hemoglobin 13.3, ferritin 203, percent saturation 29. - She usually receives IV iron if her ferritin drops below 100.  Due to her becoming symptomatic. -She does not need any iron at this time. -We will see her back in 3 months with repeat labs.  2.  Vitamin B12 deficiency: -Labs on 11/01/2019 showed her B12 level at 118.  This is been decreased over the last few visits. -We will start B12 injections weekly at home. - We called her in a new prescription to her pharmacy. -We will see her back in 3 months with repeat labs.  3.  Folate deficiency: - Labs done on 10/05/2019 showed her folate is 5.7. -She is still taking her folic acid daily. -We will recheck labs in 3 months.  4.  Vitamin D deficiency: -Labs done on 07/14/2019 showed her vitamin D level at 20.6. -We called her in a prescription for vitamin D 50,000 units weekly. -We will recheck labs in 3 months      Orders placed this encounter:  Orders Placed This Encounter  Procedures  . Lactate dehydrogenase  . CBC with Differential/Platelet  . Comprehensive metabolic panel  . Ferritin  . Iron and TIBC  . Vitamin B12  . Vitamin D 25 hydroxy  . Folate      Francene Finders, FNP-C [place] [phone]

## 2019-10-12 NOTE — Assessment & Plan Note (Signed)
1.  Iron deficiency anemia: -It is due to malabsorption from gastric bypass surgery in 2006 and blood loss from menstruation. - We gave her iron while she was pregnant with her twins.  They are now 84 months old and doing great. - She has tried oral iron supplementation in the past and was unable to tolerate or absorb it. - She last had IV iron on 07/20/2019 and 07/27/2019.  She reports her fatigue improved greatly. - Labs on 10/05/2019 showed her hemoglobin 13.3, ferritin 203, percent saturation 29. - She usually receives IV iron if her ferritin drops below 100.  Due to her becoming symptomatic. -She does not need any iron at this time. -We will see her back in 3 months with repeat labs.  2.  Vitamin B12 deficiency: -Labs on 11/01/2019 showed her B12 level at 118.  This is been decreased over the last few visits. -We will start B12 injections weekly at home. - We called her in a new prescription to her pharmacy. -We will see her back in 3 months with repeat labs.  3.  Folate deficiency: - Labs done on 10/05/2019 showed her folate is 5.7. -She is still taking her folic acid daily. -We will recheck labs in 3 months.  4.  Vitamin D deficiency: -Labs done on 07/14/2019 showed her vitamin D level at 20.6. -We called her in a prescription for vitamin D 50,000 units weekly. -We will recheck labs in 3 months

## 2019-10-19 DIAGNOSIS — G43711 Chronic migraine without aura, intractable, with status migrainosus: Secondary | ICD-10-CM | POA: Diagnosis not present

## 2019-10-31 DIAGNOSIS — Z3169 Encounter for other general counseling and advice on procreation: Secondary | ICD-10-CM | POA: Diagnosis not present

## 2019-11-02 DIAGNOSIS — Z3183 Encounter for assisted reproductive fertility procedure cycle: Secondary | ICD-10-CM | POA: Diagnosis not present

## 2019-11-02 DIAGNOSIS — Z1379 Encounter for other screening for genetic and chromosomal anomalies: Secondary | ICD-10-CM | POA: Diagnosis not present

## 2019-11-10 DIAGNOSIS — G43711 Chronic migraine without aura, intractable, with status migrainosus: Secondary | ICD-10-CM | POA: Diagnosis not present

## 2020-01-04 ENCOUNTER — Other Ambulatory Visit: Payer: Self-pay

## 2020-01-04 ENCOUNTER — Inpatient Hospital Stay (HOSPITAL_COMMUNITY): Payer: BC Managed Care – PPO | Attending: Hematology

## 2020-01-04 DIAGNOSIS — D509 Iron deficiency anemia, unspecified: Secondary | ICD-10-CM | POA: Insufficient documentation

## 2020-01-04 DIAGNOSIS — E538 Deficiency of other specified B group vitamins: Secondary | ICD-10-CM | POA: Diagnosis not present

## 2020-01-04 DIAGNOSIS — E559 Vitamin D deficiency, unspecified: Secondary | ICD-10-CM | POA: Insufficient documentation

## 2020-01-04 DIAGNOSIS — D508 Other iron deficiency anemias: Secondary | ICD-10-CM

## 2020-01-04 LAB — IRON AND TIBC
Iron: 83 ug/dL (ref 28–170)
Saturation Ratios: 26 % (ref 10.4–31.8)
TIBC: 318 ug/dL (ref 250–450)
UIBC: 235 ug/dL

## 2020-01-04 LAB — CBC WITH DIFFERENTIAL/PLATELET
Abs Immature Granulocytes: 0.04 10*3/uL (ref 0.00–0.07)
Basophils Absolute: 0.1 10*3/uL (ref 0.0–0.1)
Basophils Relative: 1 %
Eosinophils Absolute: 0.3 10*3/uL (ref 0.0–0.5)
Eosinophils Relative: 4 %
HCT: 39.8 % (ref 36.0–46.0)
Hemoglobin: 12.9 g/dL (ref 12.0–15.0)
Immature Granulocytes: 1 %
Lymphocytes Relative: 28 %
Lymphs Abs: 1.8 10*3/uL (ref 0.7–4.0)
MCH: 29.9 pg (ref 26.0–34.0)
MCHC: 32.4 g/dL (ref 30.0–36.0)
MCV: 92.3 fL (ref 80.0–100.0)
Monocytes Absolute: 0.4 10*3/uL (ref 0.1–1.0)
Monocytes Relative: 6 %
Neutro Abs: 3.7 10*3/uL (ref 1.7–7.7)
Neutrophils Relative %: 60 %
Platelets: 269 10*3/uL (ref 150–400)
RBC: 4.31 MIL/uL (ref 3.87–5.11)
RDW: 13.2 % (ref 11.5–15.5)
WBC: 6.2 10*3/uL (ref 4.0–10.5)
nRBC: 0 % (ref 0.0–0.2)

## 2020-01-04 LAB — COMPREHENSIVE METABOLIC PANEL
ALT: 162 U/L — ABNORMAL HIGH (ref 0–44)
AST: 28 U/L (ref 15–41)
Albumin: 3.8 g/dL (ref 3.5–5.0)
Alkaline Phosphatase: 157 U/L — ABNORMAL HIGH (ref 38–126)
Anion gap: 8 (ref 5–15)
BUN: 13 mg/dL (ref 6–20)
CO2: 25 mmol/L (ref 22–32)
Calcium: 8.9 mg/dL (ref 8.9–10.3)
Chloride: 104 mmol/L (ref 98–111)
Creatinine, Ser: 0.68 mg/dL (ref 0.44–1.00)
GFR calc Af Amer: >60 mL/min (ref >60)
GFR calc non Af Amer: >60 mL/min (ref >60)
Glucose, Bld: 92 mg/dL (ref 70–99)
Potassium: 4.1 mmol/L (ref 3.5–5.1)
Sodium: 137 mmol/L (ref 135–145)
Total Bilirubin: 0.3 mg/dL (ref 0.3–1.2)
Total Protein: 6.8 g/dL (ref 6.5–8.1)

## 2020-01-04 LAB — VITAMIN B12: Vitamin B-12: 201 pg/mL (ref 180–914)

## 2020-01-04 LAB — FOLATE: Folate: 14.3 ng/mL (ref >5.9)

## 2020-01-04 LAB — LACTATE DEHYDROGENASE: LDH: 141 U/L (ref 98–192)

## 2020-01-04 LAB — VITAMIN D 25 HYDROXY (VIT D DEFICIENCY, FRACTURES): Vit D, 25-Hydroxy: 50.54 ng/mL (ref 30–100)

## 2020-01-04 LAB — FERRITIN: Ferritin: 218 ng/mL (ref 11–307)

## 2020-01-11 ENCOUNTER — Inpatient Hospital Stay (HOSPITAL_BASED_OUTPATIENT_CLINIC_OR_DEPARTMENT_OTHER): Payer: BC Managed Care – PPO | Admitting: Nurse Practitioner

## 2020-01-11 DIAGNOSIS — D508 Other iron deficiency anemias: Secondary | ICD-10-CM | POA: Diagnosis not present

## 2020-01-11 DIAGNOSIS — E538 Deficiency of other specified B group vitamins: Secondary | ICD-10-CM

## 2020-01-11 DIAGNOSIS — E559 Vitamin D deficiency, unspecified: Secondary | ICD-10-CM

## 2020-01-11 MED ORDER — INSULIN SYRINGES (DISPOSABLE) U-100 1 ML MISC: 1.0000 mL | 1 refills | Status: DC

## 2020-01-11 MED ORDER — ERGOCALCIFEROL 1.25 MG (50000 UT) PO CAPS: 50000.0000 [IU] | ORAL_CAPSULE | ORAL | 4 refills | Status: DC

## 2020-01-11 MED ORDER — CYANOCOBALAMIN 1000 MCG/ML IJ KIT: 1000.0000 ug | PACK | INTRAMUSCULAR | 4 refills | Status: DC

## 2020-01-11 MED ORDER — FOLIC ACID 800 MCG PO TABS: 800.0000 ug | ORAL_TABLET | Freq: Every day | ORAL | 3 refills | Status: DC

## 2020-01-11 NOTE — Patient Instructions (Signed)
[facility] Discharge Instructions  Follow up in 3 months with lab s   Thank you for choosing [facility] at [facility] to provide your oncology and hematology care.  To afford each patient quality time with our provider, please arrive at least 15 minutes before your scheduled appointment time.   If you have a lab appointment with the Cancer Center please come in thru the Main Entrance and check in at the main information desk.  You need to re-schedule your appointment should you arrive 10 or more minutes late.  We strive to give you quality time with our providers, and arriving late affects you and other patients whose appointments are after yours.  Also, if you no show three or more times for appointments you may be dismissed from the clinic at the providers discretion.     Again, thank you for choosing [facility].  Our hope is that these requests will decrease the amount of time that you wait before being seen by our physicians.       _____________________________________________________________  Should you have questions after your visit to [facility], please contact our office at [phone] between the hours of 8:00 a.m. and 4:30 p.m.  Voicemails left after 4:00 p.m. will not be returned until the following business day.  For prescription refill requests, have your pharmacy contact our office and allow 72 hours.    Due to Covid, you will need to wear a mask upon entering the hospital. If you do not have a mask, a mask will be given to you at the Main Entrance upon arrival. For doctor visits, patients may have 1 support person with them. For treatment visits, patients can not have anyone with them due to social distancing guidelines and our immunocompromised population.      

## 2020-01-11 NOTE — Assessment & Plan Note (Signed)
1.  Iron deficiency anemia: -It is due to malabsorption from gastric bypass surgery in 2006 and blood loss from menstruation. - We gave her iron while she was pregnant with her twins.  They are now 33 months old and doing great. - She has tried oral iron supplementation in the past and was unable to tolerate or absorb it. - She last had IV iron on 07/20/2019 and 07/27/2019.  She reports her fatigue improved greatly. - She usually receives IV iron if her ferritin drops below 100.  Due to her becoming symptomatic. -Labs done on 01/04/2020 showed hemoglobin 12.9, ferritin 218, percent saturation 26, platelets 269. -She does not need any iron at this time. -We will see her back in 3 months with repeat labs.  2.  Vitamin B12 deficiency: -Labs on 11/01/2019 showed her B12 level at 118.  This is been decreased over the last few visits. -We will start B12 injections weekly at home. -She reports she is also taking 1 mg of oral B12 as well.  We have increased this to 2.5 mg. -Labs done on 01/04/2020 showed her vitamin B12 level has slightly improved to 201 -We will see her back in 3 months with repeat labs.  3.  Folate deficiency: - Labs done on 10/05/2019 showed her folate is 5.7. -She is still taking her folic acid daily. -Labs done on 01/04/2020 showed folate is 14.3 -We will recheck labs in 3 months.  4.  Vitamin D deficiency: -Labs done on 07/14/2019 showed her vitamin D level at 20.6. -We called her in a prescription for vitamin D 50,000 units weekly. -Labs done on 01/04/2020 has shown her vitamin D level is improved to 50.54 -We will recheck labs in 3 months

## 2020-01-11 NOTE — Progress Notes (Signed)
[name], [place] [address]   CLINIC:  Medical Oncology/Hematology  PCP:  Fayrene Helper, MD [address] [place] [address] [phone]   REASON FOR VISIT: Follow-up for iron deficiency anemia  CURRENT THERAPY: Intermittent iron infusions   INTERVAL HISTORY:  Alejandra Barr 34 y.o. female was called for a telephone visit today for iron deficiency anemia.  Patient reports her energy levels are actually doing pretty well.  She denies any bright red bleeding per rectum or melena.  She denies any easy bruising or bleeding.  She is taking her vitamins as prescribed. Denies any nausea, vomiting, or diarrhea. Denies any new pains. Had not noticed any recent bleeding such as epistaxis, hematuria or hematochezia. Denies recent chest pain on exertion, shortness of breath on minimal exertion, pre-syncopal episodes, or palpitations. Denies any numbness or tingling in hands or feet. Denies any recent fevers, infections, or recent hospitalizations. Patient reports appetite at 100% and energy level at 75%.  She is eating well maintain her weight at this time.   REVIEW OF SYSTEMS:  Review of Systems  All other systems reviewed and are negative.    PAST MEDICAL/SURGICAL HISTORY:  Past Medical History:  Diagnosis Date  . Asthma   . Atrial flutter ([place])    Status post RFA by Dr. Lovena Le in 2004  . Chronic anxiety   . Chronic depression   . 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 Iron deficiency   . Iron deficiency anemia 10/02/2010   Qualifier: Diagnosis of  By: Moshe Cipro MD, Joycelyn Schmid    . Low vitamin B12 level 07/10/2013   Overview:  Last Assessment & Plan:  Being treated through hematology  . Newborn product of in vitro fertilization (IVF) pregnancy   . Obesity   . PONV (postoperative nausea and vomiting)    Past Surgical History:  Procedure Laterality Date  . Bilateral eustachian tube placement on 4 different occasions    . CARDIAC  ELECTROPHYSIOLOGY MAPPING AND ABLATION    . CESAREAN SECTION N/A 05/26/2017   Procedure: CESAREAN SECTION;  Surgeon: Linda Hedges, DO;  Location: [facility];  Service: Obstetrics;  Laterality: N/A;  . CESAREAN SECTION MULTI-GESTATIONAL N/A 02/10/2019   Procedure: Repeat CESAREAN SECTION MULTI-GESTATIONAL;  Surgeon: Brien Few, MD;  Location: [facility];  Service: Obstetrics;  Laterality: N/A;  EDD: 03/15/19 Allergy: Penicillin, Morphine, Cipro  . CHOLECYSTECTOMY  2003  . COLONOSCOPY N/A 11/29/2013   Procedure: COLONOSCOPY;  Surgeon: Rogene Houston, MD;  [facility];  Service: Endoscopy;  Laterality: N/A;  120-rescheduled to 300 Ann notified pt  . GASTRIC BYPASS  2006  . TONSILLECTOMY  1990     SOCIAL HISTORY:  Social History   Socioeconomic History  . Marital status: Married    Spouse name: Not on file  . Number of children: Not on file  . Years of education: Not on file  . Highest education level: Not on file  Occupational History  . Occupation: Nurse     Employer: [employer]  Tobacco Use  . Smoking status: Former Smoker    Packs/day: 0.50    Types: Cigarettes    Start date: 03/21/2013    Quit date: 06/02/2015    Years since quitting: 4.6  . Smokeless tobacco: Never Used  Substance and Sexual Activity  . Alcohol use: No    Alcohol/week: 0.0 standard drinks    Comment: sober alcoholic 6 years  . Drug use: No  .  Sexual activity: Not Currently    Birth control/protection: None    Comment: husband had vasectomy  Other Topics Concern  . Not on file  Social History Narrative  . Not on file   Social Determinants of Health   Financial Resource Strain: Low Risk   . Difficulty of Paying Living Expenses: Not hard at all  Food Insecurity: No Food Insecurity  . Worried About Charity fundraiser in the Last Year: Never true  . Ran Out of Food in the Last Year: Never true  Transportation Needs: Unknown  . Lack of Transportation (Medical): No  . Lack of  Transportation (Non-Medical): Not on file  Physical Activity:   . Days of Exercise per Week:   . Minutes of Exercise per Session:   Stress:   . Feeling of Stress :   Social Connections:   . Frequency of Communication with Friends and Family:   . Frequency of Social Gatherings with Friends and Family:   . Attends Religious Services:   . Active Member of Clubs or Organizations:   . Attends Archivist Meetings:   Marland Kitchen Marital Status:   Intimate Partner Violence:   . Fear of Current or Ex-Partner:   . Emotionally Abused:   Marland Kitchen Physically Abused:   . Sexually Abused:     FAMILY HISTORY:  Family History  Problem Relation Age of Onset  . Fibromyalgia Mother        Chronic pelvic pain   . Hypothyroidism Mother   . Obesity Mother   . Heart defect Mother        Supraventricular, tachycardia /tachypalpation   . Arrhythmia Mother   . Breast cancer Mother 45  . Hypertension Maternal Grandmother   . Diabetes Maternal Grandmother   . Hypertension Maternal Grandfather   . Heart disease Paternal Grandmother   . Hypertension Paternal Grandmother   . Heart disease Paternal Grandfather   . Hypertension Paternal Grandfather     CURRENT MEDICATIONS:  Outpatient Encounter Medications as of 01/11/2020  Medication Sig  . butalbital-acetaminophen-caffeine (FIORICET) 50-325-40 MG tablet TK 2 TS PO ONCE A DAY PRN  . cyanocobalamin (,VITAMIN B-12,) 1000 MCG/ML injection   . Cyanocobalamin 1000 MCG/ML KIT Inject 1,000 mcg into the muscle every 7 (seven) days.  . ergocalciferol (VITAMIN D2) 1.25 MG (50000 UT) capsule Take 1 capsule (50,000 Units total) by mouth once a week.  . estradiol (ESTRACE) 2 MG tablet Take by mouth.  . ferrous sulfate 300 (60 Fe) MG/5ML syrup Take 5 mLs (300 mg total) by mouth 2 (two) times daily with a meal. Mix in 4oz of orange juice  . folic acid (FOLVITE) 226 MCG tablet Take 1 tablet (800 mcg total) by mouth daily.  Marland Kitchen ibuprofen (ADVIL,MOTRIN) 800 MG tablet Take 1  tablet (800 mg total) by mouth every 6 (six) hours.  . Insulin Syringes, Disposable, U-100 1 ML MISC 6 Syringes by Does not apply route every 30 (thirty) days.  . Insulin Syringes, Disposable, U-100 1 ML MISC 1 mL by Does not apply route every 7 (seven) days.  Marland Kitchen levothyroxine (SYNTHROID, LEVOTHROID) 50 MCG tablet Take 50-100 mcg by mouth See admin instructions. Take 1 tablet (50 mcg) by mouth on Mondays, Wednesdays, Thursdays, Fridays, & Sundays in the mornings before breakfast. Take 2 tablets (100 mcg) by mouth on Tuesdays & Saturdays in the morning before breakfast.  . magnesium oxide (MAG-OX) 400 (241.3 Mg) MG tablet Take 1 tablet (400 mg total) by mouth daily.  . metoprolol  succinate (TOPROL-XL) 25 MG 24 hr tablet TAKE ONE AND ONE-HALF TABLETS BY MOUTH DAILY  . misoprostol (CYTOTEC) 200 MCG tablet PLACE 4 TS IN CHEEK POUCH AND LEAVE FOR 1 HOUR AND THEN SWALLOW WITH WATER. REPEAT AFTER 8 H IF HEAVY BLEEDING CONTINUES  . Prenatal Vit-Fe Fumarate-FA (PRENATAL PO) Take 1 tablet by mouth daily.  Marland Kitchen senna-docusate (SENOKOT-S) 8.6-50 MG tablet Take 2 tablets by mouth daily.  Marland Kitchen topiramate (TOPAMAX) 100 MG tablet TK 1 T PO BID  . topiramate (TOPAMAX) 50 MG tablet TK 1 T PO AT BEDTIME  . [DISCONTINUED] Cyanocobalamin 1000 MCG/ML KIT Inject 1,000 mcg into the muscle every 7 (seven) days.  . [DISCONTINUED] ergocalciferol (VITAMIN D2) 1.25 MG (50000 UT) capsule Take 1 capsule (50,000 Units total) by mouth once a week.   No facility-administered encounter medications on file as of 01/11/2020.    ALLERGIES:  Allergies  Allergen Reactions  . Ciprofloxacin Anaphylaxis  . Morphine And Related Anaphylaxis  . Penicillins Anaphylaxis    Has patient had a PCN reaction causing immediate rash, facial/tongue/throat swelling, SOB or lightheadedness with hypotension: No Has patient had a PCN reaction causing severe rash involving mucus membranes or skin necrosis: No Has patient had a PCN reaction that required  hospitalization: No Has patient had a PCN reaction occurring within the last 10 years: No If all of the above answers are "NO", then may proceed with Cephalosporin use.     Vital signs: -Deferred due to telephone visit  Physical Exam -Deferred due to telephone visit -Patient was alert and oriented over the phone and in no acute distress   LABORATORY DATA:  I have reviewed the labs as listed.  CBC    Component Value Date/Time   WBC 6.2 01/04/2020 1115   RBC 4.31 01/04/2020 1115   HGB 12.9 01/04/2020 1115   HCT 39.8 01/04/2020 1115   PLT 269 01/04/2020 1115   MCV 92.3 01/04/2020 1115   MCH 29.9 01/04/2020 1115   MCHC 32.4 01/04/2020 1115   RDW 13.2 01/04/2020 1115   LYMPHSABS 1.8 01/04/2020 1115   MONOABS 0.4 01/04/2020 1115   EOSABS 0.3 01/04/2020 1115   BASOSABS 0.1 01/04/2020 1115   CMP Latest Ref Rng & Units 01/04/2020 10/05/2019 07/13/2019  Glucose 70 - 99 mg/dL 92 85 93  BUN 6 - 20 mg/dL '13 10 11  '$ Creatinine 0.44 - 1.00 mg/dL 0.68 0.63 0.59  Sodium 135 - 145 mmol/L 137 138 136  Potassium 3.5 - 5.1 mmol/L 4.1 3.9 4.1  Chloride 98 - 111 mmol/L 104 105 106  CO2 22 - 32 mmol/L '25 23 23  '$ Calcium 8.9 - 10.3 mg/dL 8.9 8.9 8.7(L)  Total Protein 6.5 - 8.1 g/dL 6.8 6.8 6.7  Total Bilirubin 0.3 - 1.2 mg/dL 0.3 0.4 0.3  Alkaline Phos 38 - 126 U/L 157(H) 128(H) 129(H)  AST 15 - 41 U/L 28 12(L) 14(L)  ALT 0 - 44 U/L 162(H) 9 12    All questions were answered to patient's stated satisfaction. Encouraged patient to call with any new concerns or questions before his next visit to the cancer center and we can certain see him sooner, if needed.      ASSESSMENT & PLAN:   Iron deficiency anemia 1.  Iron deficiency anemia: -It is due to malabsorption from gastric bypass surgery in 2006 and blood loss from menstruation. - We gave her iron while she was pregnant with her twins.  They are now 62 months old and doing great. -  She has tried oral iron supplementation in the past and was  unable to tolerate or absorb it. - She last had IV iron on 07/20/2019 and 07/27/2019.  She reports her fatigue improved greatly. - She usually receives IV iron if her ferritin drops below 100.  Due to her becoming symptomatic. -Labs done on 01/04/2020 showed hemoglobin 12.9, ferritin 218, percent saturation 26, platelets 269. -She does not need any iron at this time. -We will see her back in 3 months with repeat labs.  2.  Vitamin B12 deficiency: -Labs on 11/01/2019 showed her B12 level at 118.  This is been decreased over the last few visits. -We will start B12 injections weekly at home. -She reports she is also taking 1 mg of oral B12 as well.  We have increased this to 2.5 mg. -Labs done on 01/04/2020 showed her vitamin B12 level has slightly improved to 201 -We will see her back in 3 months with repeat labs.  3.  Folate deficiency: - Labs done on 10/05/2019 showed her folate is 5.7. -She is still taking her folic acid daily. -Labs done on 01/04/2020 showed folate is 14.3 -We will recheck labs in 3 months.  4.  Vitamin D deficiency: -Labs done on 07/14/2019 showed her vitamin D level at 20.6. -We called her in a prescription for vitamin D 50,000 units weekly. -Labs done on 01/04/2020 has shown her vitamin D level is improved to 50.54 -We will recheck labs in 3 months      Orders placed this encounter:  Orders Placed This Encounter  Procedures  . Lactate dehydrogenase  . CBC with Differential/Platelet  . Comprehensive metabolic panel  . Ferritin  . Iron and TIBC  . Vitamin B12  . VITAMIN D 25 Hydroxy (Vit-D Deficiency, Fractures)  . Folate     I provided 23 minutes of non face-to-face telephone visit time during this encounter, and > 50% was spent counseling as documented under my assessment & plan.   Francene Finders, FNP-C [facility] [phone]

## 2020-01-20 ENCOUNTER — Encounter: Payer: Self-pay | Admitting: Family Medicine

## 2020-01-24 NOTE — Telephone Encounter (Signed)
Pt referral sent to [facility] per pt let her know if she hasnt heard from them in 2-3 days to make appointment to call back and I will reach out to them

## 2020-01-31 DIAGNOSIS — G43711 Chronic migraine without aura, intractable, with status migrainosus: Secondary | ICD-10-CM | POA: Diagnosis not present

## 2020-02-02 ENCOUNTER — Other Ambulatory Visit (HOSPITAL_COMMUNITY)
Admission: RE | Admit: 2020-02-02 | Discharge: 2020-02-02 | Disposition: A | Discharge status: Home / Self Care | Payer: BC Managed Care – PPO | Source: Ambulatory Visit | Attending: Gynecology | Admitting: Gynecology

## 2020-02-02 ENCOUNTER — Other Ambulatory Visit: Payer: Self-pay | Admitting: Gynecology

## 2020-02-02 DIAGNOSIS — Z3141 Encounter for fertility testing: Secondary | ICD-10-CM | POA: Insufficient documentation

## 2020-02-02 DIAGNOSIS — G43701 Chronic migraine without aura, not intractable, with status migrainosus: Secondary | ICD-10-CM | POA: Diagnosis not present

## 2020-02-02 DIAGNOSIS — R69 Illness, unspecified: Secondary | ICD-10-CM | POA: Diagnosis not present

## 2020-02-02 LAB — TSH: TSH: 1.544 u[IU]/mL (ref 0.350–4.500)

## 2020-02-02 LAB — VITAMIN D 25 HYDROXY (VIT D DEFICIENCY, FRACTURES): Vit D, 25-Hydroxy: 41.2 ng/mL (ref 30–100)

## 2020-02-02 LAB — HEPATITIS B SURFACE ANTIGEN: Hepatitis B Surface Ag: NONREACTIVE

## 2020-02-02 LAB — HEPATITIS C ANTIBODY: HCV Ab: NONREACTIVE

## 2020-02-02 LAB — ABO/RH: ABO/RH(D): O POS

## 2020-02-03 LAB — RPR: RPR Ser Ql: NONREACTIVE

## 2020-02-03 LAB — RUBEOLA ANTIBODY IGG: Rubeola IgG: 23.4 AU/mL (ref >16.4)

## 2020-02-06 LAB — MOLECULAR ANCILLARY ONLY
Chlamydia: NEGATIVE
Comment: NEGATIVE
Comment: NORMAL
Neisseria Gonorrhea: NEGATIVE

## 2020-02-07 LAB — HIV-1/2 AB - DIFFERENTIATION
HIV 1 Ab: NEGATIVE
HIV 2 Ab: NEGATIVE
Note: NEGATIVE

## 2020-02-07 LAB — RNA QUALITATIVE: HIV 1 RNA Qualitative: 1

## 2020-02-14 DIAGNOSIS — Z3141 Encounter for fertility testing: Secondary | ICD-10-CM | POA: Diagnosis not present

## 2020-02-15 ENCOUNTER — Encounter: Payer: Self-pay | Admitting: Orthopaedic Surgery

## 2020-02-15 ENCOUNTER — Other Ambulatory Visit: Payer: Self-pay

## 2020-02-15 ENCOUNTER — Ambulatory Visit (INDEPENDENT_AMBULATORY_CARE_PROVIDER_SITE_OTHER): Payer: BC Managed Care – PPO | Admitting: Orthopaedic Surgery

## 2020-02-15 DIAGNOSIS — M545 Low back pain, unspecified: Secondary | ICD-10-CM | POA: Insufficient documentation

## 2020-02-15 NOTE — Progress Notes (Addendum)
Office Visit Note   Patient: Alejandra Barr           Date of Birth: [birth year]           MRN: [id number] Visit Date: 02/15/2020              Requested by: Fayrene Helper, [address], [place],  [place] [address] PCP: Fayrene Helper, MD   Assessment & Plan: Visit Diagnoses:  1. Low back pain without sciatica, unspecified back pain laterality, unspecified chronicity     Plan: We reviewed with her past history of some disc protrusions and thoracic spine as well as cervical spine is likely she has some disc protrusions in the lumbar spine which is responsible for symptoms we discussed physical therapy.  Currently she does not have any radiculopathy and is not having severe pain.  She understands she need conservative treatment for MRI scan could be obtained and at present I recommend she resume a gradual walking program like she was doing before she is placed at bedrest with her twins.  She has a twin stroller if she can find a flat area to walk she can push her twins.  She lives in [place] and closest therapy would be here in [place] if she decides she would like to proceed with therapy she can call and let us know we will set this up.  I can check her back again in 4 to 5 weeks as she still having problems.  Follow-Up Instructions: No follow-ups on file.   Orders:  No orders of the defined types were placed in this encounter.  No orders of the defined types were placed in this encounter.     Procedures: No procedures performed   Clinical Data: No additional findings.   Subjective: Chief Complaint  Patient presents with  . Neck - Pain  . Middle Back - Pain  . Lower Back - Pain    HPI 34 year old female post gastric bypass.  She used to weigh one 300 pounds weight now stable at one 95-205 lbs. she has had problems with some neck pain thoracic pain and also some low back pain.  Some of the pain radiated into her left anterior groin.  She is used heat  stimulator.  Sometimes it improves other times it tends to wax and wane.  Past history of two thousand fifteen MRI scan thoracic spine which showed some eccentric central narrowing at T9-T10 paracentral protrusion.  Some protrusions at T8-9 and T10-11 small protrusion at T3-4 and T4-5.  Cervical MRI also in two thousand fifteen showed some spurring at C3-4 shallow left paracentral protrusion at C7-T1 without compression.  Patient denies any associated bowel bladder symptoms she cannot take anti-inflammatories due to her gastric bypass.  She is to be active with walking a lot but was placed on bedrest prior to the birth of her twins who are doing well.  She also has a two and half-year-old child.  Review of Systems previous history of morbid obesity with gastric bypass.  C-section twins, asthma, postpartum bleeding.  Disc protrusions and degeneration as listed above.  Past history of alcoholism migraines.  Previous cardiac ablation.  Gastric bypass two thousand six.  C-section x2.  Otherwise noncontributory to HPI.   Objective: Vital Signs: BP 109/66   Pulse 100   Ht 5\' 1"  (1.549 m)   Wt 205 lb (93 kg)   BMI 38.73 kg/m   Physical Exam Constitutional:  Appearance: She is well-developed.  HENT:     Head: Normocephalic.     Right Ear: External ear normal.     Left Ear: External ear normal.  Eyes:     Pupils: Pupils are equal, round, and reactive to light.  Neck:     Thyroid: No thyromegaly.     Trachea: No tracheal deviation.  Cardiovascular:     Rate and Rhythm: Normal rate.  Pulmonary:     Effort: Pulmonary effort is normal.  Abdominal:     Palpations: Abdomen is soft.  Skin:    General: Skin is warm and dry.  Neurological:     Mental Status: She is alert and oriented to person, place, and time.  Psychiatric:        Behavior: Behavior normal.     Ortho Exam patient has negative straight leg raising 90 degrees.  Internal rotation right hip limited only 15 degrees but no pain.   External rotation 40 degrees.  Left hip as internal/external rotation 30 degrees without pain no hip flexion contracture knee reaches full extension.  Knee and ankle jerk is 2+ and symmetrical.  She is able to heel and toe walk.  Anterior tib peroneals gastrocsoleus, EHL are all strong and take good resistance.  No rash over exposed skin no lower extremity atrophy. Specialty Comments:  No specialty comments available.  Imaging: No results found.   PMFS History: Patient Active Problem List   Diagnosis Date Noted  . Low back pain 02/15/2020  . Retained products of conception, postpartum 02/20/2019  . Excessive postpartum bleeding 02/20/2019  . Postpartum hemorrhage, delayed (> 24 hrs), delivered w postpartum cond 02/20/2019  . Dichorionic diamniotic twin pregnancy 02/10/2019  . Postpartum care following cesarean delivery 02/10/2019 02/10/2019  . Breech birth 02/10/2019  . S/P cesarean section - Twins, PROM 35 wks 05/26/2017  . Migraine headache with aura 06/05/2014  . Vitamin D deficiency 08/02/2012  . Iron deficiency anemia 10/02/2010  . Atrial flutter ([place]) 08/26/2009  . Morbid obesity ([place]) 01/09/2008  . Asthma, well controlled 01/09/2008   Past Medical History:  Diagnosis Date  . Asthma   . Atrial flutter ([place])    Status post RFA by Dr. Lovena Le in 2004  . Chronic anxiety   . Chronic depression   . 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 Iron deficiency   . Iron deficiency anemia 10/02/2010   Qualifier: Diagnosis of  By: Moshe Cipro MD, Joycelyn Schmid    . Low vitamin B12 level 07/10/2013   Overview:  Last Assessment & Plan:  Being treated through hematology  . Newborn product of in vitro fertilization (IVF) pregnancy   . Obesity   . PONV (postoperative nausea and vomiting)     Family History  Problem Relation Age of Onset  . Fibromyalgia Mother        Chronic pelvic pain   . Hypothyroidism Mother   . Obesity Mother   . Heart defect Mother        Supraventricular, tachycardia  /tachypalpation   . Arrhythmia Mother   . Breast cancer Mother 39  . Hypertension Maternal Grandmother   . Diabetes Maternal Grandmother   . Hypertension Maternal Grandfather   . Heart disease Paternal Grandmother   . Hypertension Paternal Grandmother   . Heart disease Paternal Grandfather   . Hypertension Paternal Grandfather     Past Surgical History:  Procedure Laterality Date  . Bilateral eustachian tube placement on 4 different occasions    . CARDIAC ELECTROPHYSIOLOGY MAPPING AND  ABLATION    . CESAREAN SECTION N/A 05/26/2017   Procedure: CESAREAN SECTION;  Surgeon: Linda Hedges, DO;  Location: [facility];  Service: Obstetrics;  Laterality: N/A;  . CESAREAN SECTION MULTI-GESTATIONAL N/A 02/10/2019   Procedure: Repeat CESAREAN SECTION MULTI-GESTATIONAL;  Surgeon: Brien Few, MD;  Location: [facility];  Service: Obstetrics;  Laterality: N/A;  EDD: 03/15/19 Allergy: Penicillin, Morphine, Cipro  . CHOLECYSTECTOMY  2003  . COLONOSCOPY N/A 11/29/2013   Procedure: COLONOSCOPY;  Surgeon: Rogene Houston, MD;  [facility];  Service: Endoscopy;  Laterality: N/A;  120-rescheduled to 300 Ann notified pt  . GASTRIC BYPASS  2006  . TONSILLECTOMY  1990   Social History   Occupational History  . Occupation: Nurse     Employer: [employer]  Tobacco Use  . Smoking status: Former Smoker    Packs/day: 0.50    Types: Cigarettes    Start date: 03/21/2013    Quit date: 06/02/2015    Years since quitting: 4.7  . Smokeless tobacco: Never Used  Substance and Sexual Activity  . Alcohol use: No    Alcohol/week: 0.0 standard drinks    Comment: sober alcoholic 6 years  . Drug use: No  . Sexual activity: Not Currently    Birth control/protection: None    Comment: husband had vasectomy

## 2020-03-06 ENCOUNTER — Other Ambulatory Visit (HOSPITAL_COMMUNITY): Payer: Self-pay | Admitting: Neurology

## 2020-03-06 DIAGNOSIS — M542 Cervicalgia: Secondary | ICD-10-CM | POA: Diagnosis not present

## 2020-03-06 DIAGNOSIS — M545 Low back pain, unspecified: Secondary | ICD-10-CM

## 2020-03-06 DIAGNOSIS — G43701 Chronic migraine without aura, not intractable, with status migrainosus: Secondary | ICD-10-CM | POA: Diagnosis not present

## 2020-03-06 DIAGNOSIS — E538 Deficiency of other specified B group vitamins: Secondary | ICD-10-CM | POA: Diagnosis not present

## 2020-03-08 ENCOUNTER — Ambulatory Visit (HOSPITAL_COMMUNITY)
Admission: RE | Admit: 2020-03-08 | Discharge: 2020-03-08 | Disposition: A | Discharge status: Home / Self Care | Payer: BC Managed Care – PPO | Source: Ambulatory Visit | Attending: Neurology | Admitting: Neurology

## 2020-03-08 ENCOUNTER — Other Ambulatory Visit: Payer: Self-pay

## 2020-03-08 DIAGNOSIS — M545 Low back pain, unspecified: Secondary | ICD-10-CM

## 2020-03-08 DIAGNOSIS — M542 Cervicalgia: Secondary | ICD-10-CM | POA: Diagnosis not present

## 2020-03-14 DIAGNOSIS — Z3141 Encounter for fertility testing: Secondary | ICD-10-CM | POA: Diagnosis not present

## 2020-03-19 DIAGNOSIS — Z3141 Encounter for fertility testing: Secondary | ICD-10-CM | POA: Diagnosis not present

## 2020-03-22 DIAGNOSIS — Z3141 Encounter for fertility testing: Secondary | ICD-10-CM | POA: Diagnosis not present

## 2020-03-25 DIAGNOSIS — N979 Female infertility, unspecified: Secondary | ICD-10-CM | POA: Diagnosis not present

## 2020-03-25 DIAGNOSIS — Z3183 Encounter for assisted reproductive fertility procedure cycle: Secondary | ICD-10-CM | POA: Diagnosis not present

## 2020-03-25 DIAGNOSIS — Z1379 Encounter for other screening for genetic and chromosomal anomalies: Secondary | ICD-10-CM | POA: Diagnosis not present

## 2020-04-10 ENCOUNTER — Inpatient Hospital Stay (HOSPITAL_COMMUNITY): Payer: BC Managed Care – PPO | Attending: Hematology

## 2020-04-10 ENCOUNTER — Other Ambulatory Visit: Payer: Self-pay

## 2020-04-10 DIAGNOSIS — E538 Deficiency of other specified B group vitamins: Secondary | ICD-10-CM | POA: Diagnosis not present

## 2020-04-10 DIAGNOSIS — D508 Other iron deficiency anemias: Secondary | ICD-10-CM

## 2020-04-10 DIAGNOSIS — N920 Excessive and frequent menstruation with regular cycle: Secondary | ICD-10-CM | POA: Insufficient documentation

## 2020-04-10 DIAGNOSIS — K912 Postsurgical malabsorption, not elsewhere classified: Secondary | ICD-10-CM | POA: Diagnosis not present

## 2020-04-10 DIAGNOSIS — Z9884 Bariatric surgery status: Secondary | ICD-10-CM | POA: Diagnosis not present

## 2020-04-10 DIAGNOSIS — E559 Vitamin D deficiency, unspecified: Secondary | ICD-10-CM | POA: Diagnosis not present

## 2020-04-10 LAB — CBC WITH DIFFERENTIAL/PLATELET
Abs Immature Granulocytes: 0.05 10*3/uL (ref 0.00–0.07)
Basophils Absolute: 0.1 10*3/uL (ref 0.0–0.1)
Basophils Relative: 1 %
Eosinophils Absolute: 0.3 10*3/uL (ref 0.0–0.5)
Eosinophils Relative: 4 %
HCT: 38.8 % (ref 36.0–46.0)
Hemoglobin: 12.7 g/dL (ref 12.0–15.0)
Immature Granulocytes: 1 %
Lymphocytes Relative: 24 %
Lymphs Abs: 1.7 10*3/uL (ref 0.7–4.0)
MCH: 30.2 pg (ref 26.0–34.0)
MCHC: 32.7 g/dL (ref 30.0–36.0)
MCV: 92.2 fL (ref 80.0–100.0)
Monocytes Absolute: 0.3 10*3/uL (ref 0.1–1.0)
Monocytes Relative: 5 %
Neutro Abs: 4.6 10*3/uL (ref 1.7–7.7)
Neutrophils Relative %: 65 %
Platelets: 309 10*3/uL (ref 150–400)
RBC: 4.21 MIL/uL (ref 3.87–5.11)
RDW: 13.4 % (ref 11.5–15.5)
WBC: 7.1 10*3/uL (ref 4.0–10.5)
nRBC: 0 % (ref 0.0–0.2)

## 2020-04-10 LAB — COMPREHENSIVE METABOLIC PANEL
ALT: 9 U/L (ref 0–44)
AST: 11 U/L — ABNORMAL LOW (ref 15–41)
Albumin: 3.7 g/dL (ref 3.5–5.0)
Alkaline Phosphatase: 109 U/L (ref 38–126)
Anion gap: 11 (ref 5–15)
BUN: 13 mg/dL (ref 6–20)
CO2: 25 mmol/L (ref 22–32)
Calcium: 8.9 mg/dL (ref 8.9–10.3)
Chloride: 100 mmol/L (ref 98–111)
Creatinine, Ser: 0.61 mg/dL (ref 0.44–1.00)
GFR calc Af Amer: >60 mL/min (ref >60)
GFR calc non Af Amer: >60 mL/min (ref >60)
Glucose, Bld: 99 mg/dL (ref 70–99)
Potassium: 4.4 mmol/L (ref 3.5–5.1)
Sodium: 136 mmol/L (ref 135–145)
Total Bilirubin: 0.2 mg/dL — ABNORMAL LOW (ref 0.3–1.2)
Total Protein: 6.9 g/dL (ref 6.5–8.1)

## 2020-04-10 LAB — IRON AND TIBC
Iron: 107 ug/dL (ref 28–170)
Saturation Ratios: 26 % (ref 10.4–31.8)
TIBC: 412 ug/dL (ref 250–450)
UIBC: 305 ug/dL

## 2020-04-10 LAB — VITAMIN D 25 HYDROXY (VIT D DEFICIENCY, FRACTURES): Vit D, 25-Hydroxy: 31.6 ng/mL (ref 30–100)

## 2020-04-10 LAB — FOLATE: Folate: 16.7 ng/mL (ref >5.9)

## 2020-04-10 LAB — VITAMIN B12: Vitamin B-12: 146 pg/mL — ABNORMAL LOW (ref 180–914)

## 2020-04-10 LAB — FERRITIN: Ferritin: 82 ng/mL (ref 11–307)

## 2020-04-10 LAB — LACTATE DEHYDROGENASE: LDH: 130 U/L (ref 98–192)

## 2020-04-11 ENCOUNTER — Other Ambulatory Visit (HOSPITAL_COMMUNITY): Payer: Self-pay | Admitting: Neurology

## 2020-04-11 ENCOUNTER — Other Ambulatory Visit (HOSPITAL_COMMUNITY): Payer: BC Managed Care – PPO

## 2020-04-11 DIAGNOSIS — G629 Polyneuropathy, unspecified: Secondary | ICD-10-CM | POA: Diagnosis not present

## 2020-04-11 DIAGNOSIS — M25552 Pain in left hip: Secondary | ICD-10-CM

## 2020-04-11 DIAGNOSIS — M5416 Radiculopathy, lumbar region: Secondary | ICD-10-CM | POA: Diagnosis not present

## 2020-04-15 ENCOUNTER — Ambulatory Visit (HOSPITAL_COMMUNITY)
Admission: RE | Admit: 2020-04-15 | Discharge: 2020-04-15 | Disposition: A | Discharge status: Home / Self Care | Payer: BC Managed Care – PPO | Source: Ambulatory Visit | Attending: Neurology | Admitting: Neurology

## 2020-04-15 ENCOUNTER — Other Ambulatory Visit: Payer: Self-pay

## 2020-04-15 DIAGNOSIS — Z3141 Encounter for fertility testing: Secondary | ICD-10-CM | POA: Diagnosis not present

## 2020-04-15 DIAGNOSIS — M25552 Pain in left hip: Secondary | ICD-10-CM | POA: Diagnosis not present

## 2020-04-18 ENCOUNTER — Inpatient Hospital Stay (HOSPITAL_BASED_OUTPATIENT_CLINIC_OR_DEPARTMENT_OTHER): Payer: BC Managed Care – PPO | Admitting: Nurse Practitioner

## 2020-04-18 ENCOUNTER — Other Ambulatory Visit: Payer: Self-pay

## 2020-04-18 ENCOUNTER — Other Ambulatory Visit (HOSPITAL_COMMUNITY): Payer: Self-pay | Admitting: Nurse Practitioner

## 2020-04-18 DIAGNOSIS — D508 Other iron deficiency anemias: Secondary | ICD-10-CM | POA: Diagnosis not present

## 2020-04-18 NOTE — Progress Notes (Signed)
[facility]:    Alejandra Helper, MD [address] [place] [address]   DIAGNOSIS: Iron deficiency anemia  CURRENT THERAPY: Intermittent iron infusions  INTERVAL HISTORY: Alejandra Barr 34 y.o. female was called for telephone visit for iron deficiency anemia.  Patient reports she is feeling a little bit more fatigue since her last visit.  She denies any bright red bleeding per rectum or melena.  She denies any easy bruising or bleeding. Denies any nausea, vomiting, or diarrhea. Denies any new pains. Had not noticed any recent bleeding such as epistaxis, hematuria or hematochezia. Denies recent chest pain on exertion, shortness of breath on minimal exertion, pre-syncopal episodes, or palpitations. Denies any numbness or tingling in hands or feet. Denies any recent fevers, infections, or recent hospitalizations. Patient reports appetite at 100% and energy level at 50%.  She is eating well maintain her weight at this time.    Patient Active Problem List   Diagnosis Date Noted  . Low back pain 02/15/2020  . Retained products of conception, postpartum 02/20/2019  . Excessive postpartum bleeding 02/20/2019  . Postpartum hemorrhage, delayed (> 24 hrs), delivered w postpartum cond 02/20/2019  . Dichorionic diamniotic twin pregnancy 02/10/2019  . Postpartum care following cesarean delivery 02/10/2019 02/10/2019  . Breech birth 02/10/2019  . S/P cesarean section - Twins, PROM 35 wks 05/26/2017  . Migraine headache with aura 06/05/2014  . Vitamin D deficiency 08/02/2012  . Iron deficiency anemia 10/02/2010  . Atrial flutter ([place]) 08/26/2009  . Morbid obesity ([place]) 01/09/2008  . Asthma, well controlled 01/09/2008    is allergic to ciprofloxacin, morphine and related, and penicillins.  MEDICAL HISTORY: Past Medical History:  Diagnosis Date  . Asthma   . Atrial flutter ([place])    Status post RFA by Dr. Lovena Le in 2004  . Chronic anxiety   .  Chronic depression   . 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 Iron deficiency   . Iron deficiency anemia 10/02/2010   Qualifier: Diagnosis of  By: Moshe Cipro MD, Joycelyn Schmid    . Low vitamin B12 level 07/10/2013   Overview:  Last Assessment & Plan:  Being treated through hematology  . Newborn product of in vitro fertilization (IVF) pregnancy   . Obesity   . PONV (postoperative nausea and vomiting)     SURGICAL HISTORY: Past Surgical History:  Procedure Laterality Date  . Bilateral eustachian tube placement on 4 different occasions    . CARDIAC ELECTROPHYSIOLOGY MAPPING AND ABLATION    . CESAREAN SECTION N/A 05/26/2017   Procedure: CESAREAN SECTION;  Surgeon: Linda Hedges, DO;  Location: [facility];  Service: Obstetrics;  Laterality: N/A;  . CESAREAN SECTION MULTI-GESTATIONAL N/A 02/10/2019   Procedure: Repeat CESAREAN SECTION MULTI-GESTATIONAL;  Surgeon: Brien Few, MD;  Location: [facility];  Service: Obstetrics;  Laterality: N/A;  EDD: 03/15/19 Allergy: Penicillin, Morphine, Cipro  . CHOLECYSTECTOMY  2003  . COLONOSCOPY N/A 11/29/2013   Procedure: COLONOSCOPY;  Surgeon: Rogene Houston, MD;  [facility]: [facility];  Service: Endoscopy;  Laterality: N/A;  120-rescheduled to [address] notified pt  . GASTRIC BYPASS  2006  . TONSILLECTOMY  1990    SOCIAL HISTORY: Social History   Socioeconomic History  . Marital status: Married    Spouse name: Not on file  . Number of children: Not on file  . Years of education: Not on file  . Highest education level: Not on file  Occupational History  .  Occupation: Nurse     Employer: [employer]  Tobacco Use  . Smoking status: Former Smoker    Packs/day: 0.50    Types: Cigarettes    Start date: 03/21/2013    Quit date: 06/02/2015    Years since quitting: 4.8  . Smokeless tobacco: Never Used  Vaping Use  . Vaping Use: Never used  Substance and Sexual Activity  . Alcohol use: No    Alcohol/week: 0.0 standard drinks    Comment: sober alcoholic 6  years  . Drug use: No  . Sexual activity: Not Currently    Birth control/protection: None    Comment: husband had vasectomy  Other Topics Concern  . Not on file  Social History Narrative  . Not on file   Social Determinants of Health   Financial Resource Strain:   . Difficulty of Paying Living Expenses:   Food Insecurity:   . Worried About Charity fundraiser in the Last Year:   . Arboriculturist in the Last Year:   Transportation Needs:   . Film/video editor (Medical):   Marland Kitchen Lack of Transportation (Non-Medical):   Physical Activity:   . Days of Exercise per Week:   . Minutes of Exercise per Session:   Stress:   . Feeling of Stress :   Social Connections:   . Frequency of Communication with Friends and Family:   . Frequency of Social Gatherings with Friends and Family:   . Attends Religious Services:   . Active Member of Clubs or Organizations:   . Attends Archivist Meetings:   Marland Kitchen Marital Status:   Intimate Partner Violence:   . Fear of Current or Ex-Partner:   . Emotionally Abused:   Marland Kitchen Physically Abused:   . Sexually Abused:     FAMILY HISTORY: Family History  Problem Relation Age of Onset  . Fibromyalgia Mother        Chronic pelvic pain   . Hypothyroidism Mother   . Obesity Mother   . Heart defect Mother        Supraventricular, tachycardia /tachypalpation   . Arrhythmia Mother   . Breast cancer Mother 36  . Hypertension Maternal Grandmother   . Diabetes Maternal Grandmother   . Hypertension Maternal Grandfather   . Heart disease Paternal Grandmother   . Hypertension Paternal Grandmother   . Heart disease Paternal Grandfather   . Hypertension Paternal Grandfather     Review of Systems  Constitutional: Positive for fatigue.  All other systems reviewed and are negative.   Vital signs: -Deferred due to telephone visit   Physical Exam -Deferred due to telephone visit -Patient was alert and oriented over the phone and in no acute  distress   LABORATORY DATA:  CBC    Component Value Date/Time   WBC 7.1 04/10/2020 0836   RBC 4.21 04/10/2020 0836   HGB 12.7 04/10/2020 0836   HCT 38.8 04/10/2020 0836   PLT 309 04/10/2020 0836   MCV 92.2 04/10/2020 0836   MCH 30.2 04/10/2020 0836   MCHC 32.7 04/10/2020 0836   RDW 13.4 04/10/2020 0836   LYMPHSABS 1.7 04/10/2020 0836   MONOABS 0.3 04/10/2020 0836   EOSABS 0.3 04/10/2020 0836   BASOSABS 0.1 04/10/2020 0836    CMP     Component Value Date/Time   NA 136 04/10/2020 0836   K 4.4 04/10/2020 0836   CL 100 04/10/2020 0836   CO2 25 04/10/2020 0836   GLUCOSE 99 04/10/2020 0836   BUN  13 04/10/2020 0836   CREATININE 0.61 04/10/2020 0836   CREATININE 0.83 06/05/2014 1044   CALCIUM 8.9 04/10/2020 0836   PROT 6.9 04/10/2020 0836   ALBUMIN 3.7 04/10/2020 0836   AST 11 (L) 04/10/2020 0836   ALT 9 04/10/2020 0836   ALKPHOS 109 04/10/2020 0836   BILITOT 0.2 (L) 04/10/2020 0836   GFRNONAA >60 04/10/2020 0836   GFRNONAA >89 07/06/2013 1520   GFRAA >60 04/10/2020 0836   GFRAA >89 07/06/2013 1520    All questions were answered to patient's stated satisfaction. Encouraged patient to call with any new concerns or questions before his next visit to the cancer center and we can certain see him sooner, if needed.     ASSESSMENT and THERAPY PLAN:   Iron deficiency anemia 1.  Iron deficiency anemia: -It is due to malabsorption from gastric bypass surgery in 2006 and blood loss from menstruation. - We gave her iron while she was pregnant with her twins.   - She has tried oral iron supplementation in the past and was unable to tolerate or absorb it. - She last had IV iron on 07/20/2019 and 07/27/2019.  She reports her fatigue improved greatly. - She usually receives IV iron if her ferritin drops below 100.  Due to her becoming symptomatic. -Labs done on 04/10/2020 showed hemoglobin 12.7, ferritin 82, percent saturation 26 -We will set her up with 2 infusions of IV iron at  this time. -We will see her back in 3 months with repeat labs.  2.  Vitamin B12 deficiency: -Labs on 11/01/2019 showed her B12 level at 118.  This is been decreased over the last few visits. -We will start B12 injections weekly at home. -She reports she is also taking 1 mg of oral B12 as well.  We have increased this to 2.5 mg. -Labs done on 04/10/2020 showed her vitamin B12 level is 146 -We will see her back in 3 months with repeat labs.  3.  Folate deficiency: - Labs done on 10/05/2019 showed her folate is 5.7. -She is still taking her folic acid daily. -Labs done on 04/10/2020 showed folate is 16.7 -We will recheck labs in 3 months.  4.  Vitamin D deficiency: -Labs done on 07/14/2019 showed her vitamin D level at 20.6. -We called her in a prescription for vitamin D 50,000 units weekly. -Labs done on 04/10/2020 showed her vitamin D level is 31.60 -We will recheck labs in 3 months   Orders Placed This Encounter  Procedures  . Lactate dehydrogenase    Standing Status:   Future    Standing Expiration Date:   04/18/2021  . CBC with Differential/Platelet    Standing Status:   Future    Standing Expiration Date:   04/18/2021  . Comprehensive metabolic panel    Standing Status:   Future    Standing Expiration Date:   04/18/2021  . Ferritin    Standing Status:   Future    Standing Expiration Date:   04/18/2021  . Iron and TIBC    Standing Status:   Future    Standing Expiration Date:   04/18/2021  . Vitamin B12    Standing Status:   Future    Standing Expiration Date:   04/18/2021  . VITAMIN D 25 Hydroxy (Vit-D Deficiency, Fractures)    Standing Status:   Future    Standing Expiration Date:   04/18/2021  . Folate    Standing Status:   Future    Standing Expiration Date:  04/18/2021    All questions were answered. The patient knows to call the clinic with any problems, questions or concerns. We can certainly see the patient much sooner if necessary. This note was electronically  signed.  I provided 29 minutes of non face-to-face telephone visit time during this encounter, and > 50% was spent counseling as documented under my assessment & plan.   Glennie Isle, NP-C 04/18/2020

## 2020-04-18 NOTE — Assessment & Plan Note (Signed)
1.  Iron deficiency anemia: -It is due to malabsorption from gastric bypass surgery in 2006 and blood loss from menstruation. - We gave her iron while she was pregnant with her twins.   - She has tried oral iron supplementation in the past and was unable to tolerate or absorb it. - She last had IV iron on 07/20/2019 and 07/27/2019.  She reports her fatigue improved greatly. - She usually receives IV iron if her ferritin drops below 100.  Due to her becoming symptomatic. -Labs done on 04/10/2020 showed hemoglobin 12.7, ferritin 82, percent saturation 26 -We will set her up with 2 infusions of IV iron at this time. -We will see her back in 3 months with repeat labs.  2.  Vitamin B12 deficiency: -Labs on 11/01/2019 showed her B12 level at 118.  This is been decreased over the last few visits. -We will start B12 injections weekly at home. -She reports she is also taking 1 mg of oral B12 as well.  We have increased this to 2.5 mg. -Labs done on 04/10/2020 showed her vitamin B12 level is 146 -We will see her back in 3 months with repeat labs.  3.  Folate deficiency: - Labs done on 10/05/2019 showed her folate is 5.7. -She is still taking her folic acid daily. -Labs done on 04/10/2020 showed folate is 16.7 -We will recheck labs in 3 months.  4.  Vitamin D deficiency: -Labs done on 07/14/2019 showed her vitamin D level at 20.6. -We called her in a prescription for vitamin D 50,000 units weekly. -Labs done on 04/10/2020 showed her vitamin D level is 31.60 -We will recheck labs in 3 months

## 2020-04-26 ENCOUNTER — Other Ambulatory Visit: Payer: Self-pay

## 2020-04-26 ENCOUNTER — Inpatient Hospital Stay (HOSPITAL_COMMUNITY): Payer: BC Managed Care – PPO

## 2020-04-26 VITALS — BP 100/55 | HR 81 | Temp 97.5°F | Resp 18

## 2020-04-26 DIAGNOSIS — G43701 Chronic migraine without aura, not intractable, with status migrainosus: Secondary | ICD-10-CM | POA: Diagnosis not present

## 2020-04-26 DIAGNOSIS — D508 Other iron deficiency anemias: Secondary | ICD-10-CM | POA: Diagnosis not present

## 2020-04-26 DIAGNOSIS — E538 Deficiency of other specified B group vitamins: Secondary | ICD-10-CM | POA: Diagnosis not present

## 2020-04-26 DIAGNOSIS — K912 Postsurgical malabsorption, not elsewhere classified: Secondary | ICD-10-CM | POA: Diagnosis not present

## 2020-04-26 DIAGNOSIS — E559 Vitamin D deficiency, unspecified: Secondary | ICD-10-CM | POA: Diagnosis not present

## 2020-04-26 DIAGNOSIS — Z9884 Bariatric surgery status: Secondary | ICD-10-CM | POA: Diagnosis not present

## 2020-04-26 DIAGNOSIS — N920 Excessive and frequent menstruation with regular cycle: Secondary | ICD-10-CM | POA: Diagnosis not present

## 2020-04-26 MED ORDER — SODIUM CHLORIDE 0.9 % IV SOLN: INTRAVENOUS | Status: DC

## 2020-04-26 MED ORDER — FULVESTRANT 250 MG/5ML IM SOLN
INTRAMUSCULAR | Status: AC
  Filled 2020-04-26: qty 10

## 2020-04-26 MED ORDER — SODIUM CHLORIDE 0.9 % IV SOLN
510.0000 mg | Freq: Once | INTRAVENOUS | Status: AC
  Administered 2020-04-26: 510 mg via INTRAVENOUS
  Filled 2020-04-26: qty 17

## 2020-04-26 NOTE — Progress Notes (Signed)
Iron infusion given per orders. Patient tolerated it well without problems. Vitals stable and discharged home from clinic ambulatory. Follow up as scheduled.  

## 2020-04-26 NOTE — Patient Instructions (Signed)
[facility]  Discharge Instructions:   _______________________________________________________________  Thank you for choosing [facility] at [facility] to provide your oncology and hematology care.  To afford each patient quality time with our providers, please arrive at least 15 minutes before your scheduled appointment.  You need to re-schedule your appointment if you arrive 10 or more minutes late.  We strive to give you quality time with our providers, and arriving late affects you and other patients whose appointments are after yours.  Also, if you no show three or more times for appointments you may be dismissed from the clinic.  Again, thank you for choosing [facility] at [facility]. Our hope is that these requests will allow you access to exceptional care and in a timely manner. _______________________________________________________________  If you have questions after your visit, please contact our office at [phone] between the hours of 8:30 a.m. and 5:00 p.m. Voicemails left after 4:30 p.m. will not be returned until the following business day. _______________________________________________________________  For prescription refill requests, have your pharmacy contact our office. _______________________________________________________________  Recommendations made by the consultant and any test results will be sent to your referring physician. _______________________________________________________________ 

## 2020-05-03 ENCOUNTER — Inpatient Hospital Stay (HOSPITAL_COMMUNITY): Payer: BC Managed Care – PPO | Attending: Nurse Practitioner

## 2020-05-03 ENCOUNTER — Encounter (HOSPITAL_COMMUNITY): Payer: Self-pay

## 2020-05-03 VITALS — BP 91/66 | HR 78 | Temp 97.5°F | Resp 18

## 2020-05-03 DIAGNOSIS — D508 Other iron deficiency anemias: Secondary | ICD-10-CM

## 2020-05-03 MED ORDER — SODIUM CHLORIDE 0.9 % IV SOLN
510.0000 mg | Freq: Once | INTRAVENOUS | Status: AC
  Administered 2020-05-03: 510 mg via INTRAVENOUS
  Filled 2020-05-03: qty 510

## 2020-05-03 MED ORDER — SODIUM CHLORIDE 0.9 % IV SOLN: Freq: Once | INTRAVENOUS | Status: AC

## 2020-05-03 NOTE — Progress Notes (Signed)
Iron infusion given per orders. Patient tolerated it well without problems. Vitals stable and discharged home from clinic ambulatory. Follow up as scheduled.  

## 2020-05-03 NOTE — Patient Instructions (Signed)
[facility]  Discharge Instructions:   _______________________________________________________________  Thank you for choosing [facility] at [facility] to provide your oncology and hematology care.  To afford each patient quality time with our providers, please arrive at least 15 minutes before your scheduled appointment.  You need to re-schedule your appointment if you arrive 10 or more minutes late.  We strive to give you quality time with our providers, and arriving late affects you and other patients whose appointments are after yours.  Also, if you no show three or more times for appointments you may be dismissed from the clinic.  Again, thank you for choosing [facility] at [facility]. Our hope is that these requests will allow you access to exceptional care and in a timely manner. _______________________________________________________________  If you have questions after your visit, please contact our office at [phone] between the hours of 8:30 a.m. and 5:00 p.m. Voicemails left after 4:30 p.m. will not be returned until the following business day. _______________________________________________________________  For prescription refill requests, have your pharmacy contact our office. _______________________________________________________________  Recommendations made by the consultant and any test results will be sent to your referring physician. _______________________________________________________________ 

## 2020-05-08 DIAGNOSIS — Z3141 Encounter for fertility testing: Secondary | ICD-10-CM | POA: Diagnosis not present

## 2020-05-14 DIAGNOSIS — Z3183 Encounter for assisted reproductive fertility procedure cycle: Secondary | ICD-10-CM | POA: Diagnosis not present

## 2020-05-22 DIAGNOSIS — Z3200 Encounter for pregnancy test, result unknown: Secondary | ICD-10-CM | POA: Diagnosis not present

## 2020-06-12 DIAGNOSIS — Z3141 Encounter for fertility testing: Secondary | ICD-10-CM | POA: Diagnosis not present

## 2020-06-19 DIAGNOSIS — N979 Female infertility, unspecified: Secondary | ICD-10-CM | POA: Diagnosis not present

## 2020-06-27 DIAGNOSIS — Z3200 Encounter for pregnancy test, result unknown: Secondary | ICD-10-CM | POA: Diagnosis not present

## 2020-07-01 DIAGNOSIS — Z3200 Encounter for pregnancy test, result unknown: Secondary | ICD-10-CM | POA: Diagnosis not present

## 2020-07-05 DIAGNOSIS — Z3200 Encounter for pregnancy test, result unknown: Secondary | ICD-10-CM | POA: Diagnosis not present

## 2020-07-10 DIAGNOSIS — Z3200 Encounter for pregnancy test, result unknown: Secondary | ICD-10-CM | POA: Diagnosis not present

## 2020-07-12 ENCOUNTER — Inpatient Hospital Stay (HOSPITAL_COMMUNITY): Payer: BC Managed Care – PPO

## 2020-07-15 ENCOUNTER — Other Ambulatory Visit: Payer: Self-pay

## 2020-07-15 ENCOUNTER — Inpatient Hospital Stay (HOSPITAL_COMMUNITY): Payer: BC Managed Care – PPO | Attending: Hematology

## 2020-07-15 DIAGNOSIS — D508 Other iron deficiency anemias: Secondary | ICD-10-CM

## 2020-07-15 DIAGNOSIS — E559 Vitamin D deficiency, unspecified: Secondary | ICD-10-CM | POA: Insufficient documentation

## 2020-07-15 DIAGNOSIS — O99011 Anemia complicating pregnancy, first trimester: Secondary | ICD-10-CM | POA: Diagnosis not present

## 2020-07-15 DIAGNOSIS — E538 Deficiency of other specified B group vitamins: Secondary | ICD-10-CM | POA: Insufficient documentation

## 2020-07-15 DIAGNOSIS — D509 Iron deficiency anemia, unspecified: Secondary | ICD-10-CM | POA: Diagnosis not present

## 2020-07-15 LAB — COMPREHENSIVE METABOLIC PANEL
ALT: 10 U/L (ref 0–44)
AST: 13 U/L — ABNORMAL LOW (ref 15–41)
Albumin: 3.5 g/dL (ref 3.5–5.0)
Alkaline Phosphatase: 85 U/L (ref 38–126)
Anion gap: 9 (ref 5–15)
BUN: 13 mg/dL (ref 6–20)
CO2: 22 mmol/L (ref 22–32)
Calcium: 9.1 mg/dL (ref 8.9–10.3)
Chloride: 106 mmol/L (ref 98–111)
Creatinine, Ser: 0.61 mg/dL (ref 0.44–1.00)
GFR calc Af Amer: >60 mL/min (ref >60)
GFR calc non Af Amer: >60 mL/min (ref >60)
Glucose, Bld: 78 mg/dL (ref 70–99)
Potassium: 3.8 mmol/L (ref 3.5–5.1)
Sodium: 137 mmol/L (ref 135–145)
Total Bilirubin: 0.5 mg/dL (ref 0.3–1.2)
Total Protein: 6.5 g/dL (ref 6.5–8.1)

## 2020-07-15 LAB — IRON AND TIBC
Iron: 130 ug/dL (ref 28–170)
Saturation Ratios: 39 % — ABNORMAL HIGH (ref 10.4–31.8)
TIBC: 337 ug/dL (ref 250–450)
UIBC: 207 ug/dL

## 2020-07-15 LAB — VITAMIN B12: Vitamin B-12: 378 pg/mL (ref 180–914)

## 2020-07-15 LAB — CBC WITH DIFFERENTIAL/PLATELET
Abs Immature Granulocytes: 0.07 10*3/uL (ref 0.00–0.07)
Basophils Absolute: 0.1 10*3/uL (ref 0.0–0.1)
Basophils Relative: 1 %
Eosinophils Absolute: 0.5 10*3/uL (ref 0.0–0.5)
Eosinophils Relative: 6 %
HCT: 39.4 % (ref 36.0–46.0)
Hemoglobin: 12.5 g/dL (ref 12.0–15.0)
Immature Granulocytes: 1 %
Lymphocytes Relative: 21 %
Lymphs Abs: 1.9 10*3/uL (ref 0.7–4.0)
MCH: 29.7 pg (ref 26.0–34.0)
MCHC: 31.7 g/dL (ref 30.0–36.0)
MCV: 93.6 fL (ref 80.0–100.0)
Monocytes Absolute: 0.5 10*3/uL (ref 0.1–1.0)
Monocytes Relative: 6 %
Neutro Abs: 5.9 10*3/uL (ref 1.7–7.7)
Neutrophils Relative %: 65 %
Platelets: 287 10*3/uL (ref 150–400)
RBC: 4.21 MIL/uL (ref 3.87–5.11)
RDW: 13.5 % (ref 11.5–15.5)
WBC: 9 10*3/uL (ref 4.0–10.5)
nRBC: 0 % (ref 0.0–0.2)

## 2020-07-15 LAB — FERRITIN: Ferritin: 325 ng/mL — ABNORMAL HIGH (ref 11–307)

## 2020-07-15 LAB — LACTATE DEHYDROGENASE: LDH: 119 U/L (ref 98–192)

## 2020-07-15 LAB — VITAMIN D 25 HYDROXY (VIT D DEFICIENCY, FRACTURES): Vit D, 25-Hydroxy: 30.2 ng/mL (ref 30–100)

## 2020-07-15 LAB — FOLATE: Folate: 74.6 ng/mL (ref >5.9)

## 2020-07-17 ENCOUNTER — Inpatient Hospital Stay (HOSPITAL_BASED_OUTPATIENT_CLINIC_OR_DEPARTMENT_OTHER): Payer: BC Managed Care – PPO | Admitting: Nurse Practitioner

## 2020-07-17 DIAGNOSIS — D508 Other iron deficiency anemias: Secondary | ICD-10-CM | POA: Diagnosis not present

## 2020-07-17 NOTE — Assessment & Plan Note (Signed)
1.  Iron deficiency anemia: -It is due to malabsorption from gastric bypass surgery in 2006 and blood loss from menstruation. - We gave her iron while she was pregnant with her twins.   - She has tried oral iron supplementation in the past and was unable to tolerate or absorb it. - She last had IV iron on 04/26/2020 and 05/03/2020.  She reports her fatigue improved greatly. - She usually receives IV iron if her ferritin drops below 100.  Due to her becoming symptomatic. -Labs done on 07/15/2020 showed hemoglobin 12.5, ferritin 325, percent saturation 39. -We will hold off on iron infusions at this time. -We will start following her closely as she is [redacted] weeks pregnant. -We will see her back in 2 months with repeat labs.  2.  Vitamin B12 deficiency: -Labs on 11/01/2019 showed her B12 level at 118.  This is been decreased over the last few visits. -We will start B12 injections weekly at home. -She reports she is also taking 1 mg of oral B12 as well.  We have increased this to 2.5 mg. -Labs done on 07/15/2020 showed her vitamin B12 level is 378 -We will see her back in 2 months with repeat labs.  3.  Folate deficiency: - Labs done on 10/05/2019 showed her folate is 5.7. -She is still taking her folic acid daily. -Labs done on 07/15/2020 showed folate is 74.6 -We will recheck labs in 2 months.  4.  Vitamin D deficiency: -Labs done on 07/14/2019 showed her vitamin D level at 20.6. -We called her in a prescription for vitamin D 50,000 units weekly. -Labs done on 07/15/2020 showed her vitamin D level is 30.20 -We will recheck labs in 2 months

## 2020-07-17 NOTE — Progress Notes (Signed)
[facility] Follow up:    Alejandra Helper, MD [address], [address] [place] [address]   DIAGNOSIS: Iron deficiency anemia  CURRENT THERAPY: Intermittent iron infusions  INTERVAL HISTORY: Alejandra Barr 34 y.o. female was called for telephone visit to follow-up for iron deficiency anemia.  Patient reports she is now [redacted] weeks pregnant.  She reports she has more issues with her iron and B12 while she is pregnant.  We will start following her closely.  She denies any bright red bleeding per rectum or melena.  She denies easy bruising or bleeding.  She reports her energy levels have improved some with her iron infusions. Denies any nausea, vomiting, or diarrhea. Denies any new pains. Had not noticed any recent bleeding such as epistaxis, hematuria or hematochezia. Denies recent chest pain on exertion, shortness of breath on minimal exertion, pre-syncopal episodes, or palpitations. Denies any numbness or tingling in hands or feet. Denies any recent fevers, infections, or recent hospitalizations. Patient reports appetite at 100% and energy level at 75%.  She is eating well maintain her weight this time.    Patient Active Problem List   Diagnosis Date Noted  . Low back pain 02/15/2020  . Retained products of conception, postpartum 02/20/2019  . Excessive postpartum bleeding 02/20/2019  . Postpartum hemorrhage, delayed (> 24 hrs), delivered w postpartum cond 02/20/2019  . Dichorionic diamniotic twin pregnancy 02/10/2019  . Postpartum care following cesarean delivery 02/10/2019 02/10/2019  . Breech birth 02/10/2019  . S/P cesarean section - Twins, PROM 35 wks 05/26/2017  . Migraine headache with aura 06/05/2014  . Vitamin D deficiency 08/02/2012  . Iron deficiency anemia 10/02/2010  . Atrial flutter ([place]) 08/26/2009  . Morbid obesity ([place]) 01/09/2008  . Asthma, well controlled 01/09/2008    is allergic to ciprofloxacin, morphine and related, and  penicillins.  MEDICAL HISTORY: Past Medical History:  Diagnosis Date  . Asthma   . Atrial flutter ([place])    Status post RFA by Dr. Lovena Le in 2004  . Chronic anxiety   . Chronic depression   . 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 Iron deficiency   . Iron deficiency anemia 10/02/2010   Qualifier: Diagnosis of  By: Moshe Cipro MD, Joycelyn Schmid    . Low vitamin B12 level 07/10/2013   Overview:  Last Assessment & Plan:  Being treated through hematology  . Newborn product of in vitro fertilization (IVF) pregnancy   . Obesity   . PONV (postoperative nausea and vomiting)     SURGICAL HISTORY: Past Surgical History:  Procedure Laterality Date  . Bilateral eustachian tube placement on 4 different occasions    . CARDIAC ELECTROPHYSIOLOGY MAPPING AND ABLATION    . CESAREAN SECTION N/A 05/26/2017   Procedure: CESAREAN SECTION;  Surgeon: Linda Hedges, DO;  Location: [facility];  Service: Obstetrics;  Laterality: N/A;  . CESAREAN SECTION MULTI-GESTATIONAL N/A 02/10/2019   Procedure: Repeat CESAREAN SECTION MULTI-GESTATIONAL;  Surgeon: Brien Few, MD;  Location: [facility];  Service: Obstetrics;  Laterality: N/A;  EDD: 03/15/19 Allergy: Penicillin, Morphine, Cipro  . CHOLECYSTECTOMY  2003  . COLONOSCOPY N/A 11/29/2013   Procedure: COLONOSCOPY;  Surgeon: Rogene Houston, MD;  [facility];  Service: Endoscopy;  Laterality: N/A;  120-rescheduled to [address] notified pt  . GASTRIC BYPASS  2006  . TONSILLECTOMY  1990    SOCIAL HISTORY: Social History   Socioeconomic History  . Marital status: Married    Spouse name: Not  on file  . Number of children: Not on file  . Years of education: Not on file  . Highest education level: Not on file  Occupational History  . Occupation: Nurse     Employer: [employer]  Tobacco Use  . Smoking status: Former Smoker    Packs/day: 0.50    Types: Cigarettes    Start date: 03/21/2013    Quit date: 06/02/2015    Years since quitting: 5.1  . Smokeless  tobacco: Never Used  Vaping Use  . Vaping Use: Never used  Substance and Sexual Activity  . Alcohol use: No    Alcohol/week: 0.0 standard drinks    Comment: sober alcoholic 6 years  . Drug use: No  . Sexual activity: Not Currently    Birth control/protection: None    Comment: husband had vasectomy  Other Topics Concern  . Not on file  Social History Narrative  . Not on file   Social Determinants of Health   Financial Resource Strain:   . Difficulty of Paying Living Expenses: Not on file  Food Insecurity:   . Worried About Charity fundraiser in the Last Year: Not on file  . Ran Out of Food in the Last Year: Not on file  Transportation Needs:   . Lack of Transportation (Medical): Not on file  . Lack of Transportation (Non-Medical): Not on file  Physical Activity:   . Days of Exercise per Week: Not on file  . Minutes of Exercise per Session: Not on file  Stress:   . Feeling of Stress : Not on file  Social Connections:   . Frequency of Communication with Friends and Family: Not on file  . Frequency of Social Gatherings with Friends and Family: Not on file  . Attends Religious Services: Not on file  . Active Member of Clubs or Organizations: Not on file  . Attends Archivist Meetings: Not on file  . Marital Status: Not on file  Intimate Partner Violence:   . Fear of Current or Ex-Partner: Not on file  . Emotionally Abused: Not on file  . Physically Abused: Not on file  . Sexually Abused: Not on file    FAMILY HISTORY: Family History  Problem Relation Age of Onset  . Fibromyalgia Mother        Chronic pelvic pain   . Hypothyroidism Mother   . Obesity Mother   . Heart defect Mother        Supraventricular, tachycardia /tachypalpation   . Arrhythmia Mother   . Breast cancer Mother 54  . Hypertension Maternal Grandmother   . Diabetes Maternal Grandmother   . Hypertension Maternal Grandfather   . Heart disease Paternal Grandmother   . Hypertension Paternal  Grandmother   . Heart disease Paternal Grandfather   . Hypertension Paternal Grandfather     Review of Systems  All other systems reviewed and are negative.   Vital signs: -Deferred due to telephone visit  Physical Exam -Deferred due to telephone visit -Patient was alert and oriented the phone in no acute distress   LABORATORY DATA:  CBC    Component Value Date/Time   WBC 9.0 07/15/2020 0919   RBC 4.21 07/15/2020 0919   HGB 12.5 07/15/2020 0919   HCT 39.4 07/15/2020 0919   PLT 287 07/15/2020 0919   MCV 93.6 07/15/2020 0919   MCH 29.7 07/15/2020 0919   MCHC 31.7 07/15/2020 0919   RDW 13.5 07/15/2020 0919   LYMPHSABS 1.9 07/15/2020 0919   MONOABS  0.5 07/15/2020 0919   EOSABS 0.5 07/15/2020 0919   BASOSABS 0.1 07/15/2020 0919    CMP     Component Value Date/Time   NA 137 07/15/2020 0919   K 3.8 07/15/2020 0919   CL 106 07/15/2020 0919   CO2 22 07/15/2020 0919   GLUCOSE 78 07/15/2020 0919   BUN 13 07/15/2020 0919   CREATININE 0.61 07/15/2020 0919   CREATININE 0.83 06/05/2014 1044   CALCIUM 9.1 07/15/2020 0919   PROT 6.5 07/15/2020 0919   ALBUMIN 3.5 07/15/2020 0919   AST 13 (L) 07/15/2020 0919   ALT 10 07/15/2020 0919   ALKPHOS 85 07/15/2020 0919   BILITOT 0.5 07/15/2020 0919   GFRNONAA >60 07/15/2020 0919   GFRNONAA >89 07/06/2013 1520   GFRAA >60 07/15/2020 0919   GFRAA >89 07/06/2013 1520    All questions were answered to patient's stated satisfaction. Encouraged patient to call with any new concerns or questions before his next visit to the cancer center and we can certain see him sooner, if needed.     ASSESSMENT and THERAPY PLAN:   Iron deficiency anemia 1.  Iron deficiency anemia: -It is due to malabsorption from gastric bypass surgery in 2006 and blood loss from menstruation. - We gave her iron while she was pregnant with her twins.   - She has tried oral iron supplementation in the past and was unable to tolerate or absorb it. - She last had  IV iron on 04/26/2020 and 05/03/2020.  She reports her fatigue improved greatly. - She usually receives IV iron if her ferritin drops below 100.  Due to her becoming symptomatic. -Labs done on 07/15/2020 showed hemoglobin 12.5, ferritin 325, percent saturation 39. -We will hold off on iron infusions at this time. -We will start following her closely as she is [redacted] weeks pregnant. -We will see her back in 2 months with repeat labs.  2.  Vitamin B12 deficiency: -Labs on 11/01/2019 showed her B12 level at 118.  This is been decreased over the last few visits. -We will start B12 injections weekly at home. -She reports she is also taking 1 mg of oral B12 as well.  We have increased this to 2.5 mg. -Labs done on 07/15/2020 showed her vitamin B12 level is 378 -We will see her back in 2 months with repeat labs.  3.  Folate deficiency: - Labs done on 10/05/2019 showed her folate is 5.7. -She is still taking her folic acid daily. -Labs done on 07/15/2020 showed folate is 74.6 -We will recheck labs in 2 months.  4.  Vitamin D deficiency: -Labs done on 07/14/2019 showed her vitamin D level at 20.6. -We called her in a prescription for vitamin D 50,000 units weekly. -Labs done on 07/15/2020 showed her vitamin D level is 30.20 -We will recheck labs in 2 months   Orders Placed This Encounter  Procedures  . CBC with Differential/Platelet    Standing Status:   Future    Standing Expiration Date:   07/17/2021  . Comprehensive metabolic panel    Standing Status:   Future    Standing Expiration Date:   07/17/2021  . Ferritin    Standing Status:   Future    Standing Expiration Date:   07/17/2021  . Iron and TIBC    Standing Status:   Future    Standing Expiration Date:   07/17/2021  . Lactate dehydrogenase    Standing Status:   Future    Standing Expiration Date:  07/17/2021  . Vitamin B12    Standing Status:   Future    Standing Expiration Date:   07/17/2021  . VITAMIN D 25 Hydroxy (Vit-D Deficiency,  Fractures)    Standing Status:   Future    Standing Expiration Date:   07/17/2021  . Folate    Standing Status:   Future    Standing Expiration Date:   07/17/2021    All questions were answered. The patient knows to call the clinic with any problems, questions or concerns. We can certainly see the patient much sooner if necessary. This note was electronically signed.  I provided 28 minutes of non face-to-face telephone visit time during this encounter, and > 50% was spent counseling as documented under my assessment & plan.   Glennie Isle, NP-C 07/17/2020

## 2020-07-19 ENCOUNTER — Telehealth (HOSPITAL_COMMUNITY): Payer: BC Managed Care – PPO | Admitting: Nurse Practitioner

## 2020-07-19 DIAGNOSIS — O09811 Supervision of pregnancy resulting from assisted reproductive technology, first trimester: Secondary | ICD-10-CM | POA: Diagnosis not present

## 2020-07-19 DIAGNOSIS — Z3A00 Weeks of gestation of pregnancy not specified: Secondary | ICD-10-CM | POA: Diagnosis not present

## 2020-07-26 DIAGNOSIS — G43701 Chronic migraine without aura, not intractable, with status migrainosus: Secondary | ICD-10-CM | POA: Diagnosis not present

## 2020-08-06 DIAGNOSIS — Z3A09 9 weeks gestation of pregnancy: Secondary | ICD-10-CM | POA: Diagnosis not present

## 2020-08-06 DIAGNOSIS — E039 Hypothyroidism, unspecified: Secondary | ICD-10-CM | POA: Diagnosis not present

## 2020-08-06 DIAGNOSIS — Z3689 Encounter for other specified antenatal screening: Secondary | ICD-10-CM | POA: Diagnosis not present

## 2020-08-06 DIAGNOSIS — O09511 Supervision of elderly primigravida, first trimester: Secondary | ICD-10-CM | POA: Diagnosis not present

## 2020-08-06 LAB — OB RESULTS CONSOLE HEPATITIS B SURFACE ANTIGEN: Hepatitis B Surface Ag: NEGATIVE

## 2020-08-06 LAB — OB RESULTS CONSOLE RPR: RPR: NONREACTIVE

## 2020-08-06 LAB — OB RESULTS CONSOLE RUBELLA ANTIBODY, IGM: Rubella: IMMUNE

## 2020-08-06 LAB — OB RESULTS CONSOLE HIV ANTIBODY (ROUTINE TESTING): HIV: NONREACTIVE

## 2020-08-26 DIAGNOSIS — Z3A12 12 weeks gestation of pregnancy: Secondary | ICD-10-CM | POA: Diagnosis not present

## 2020-08-26 DIAGNOSIS — O09511 Supervision of elderly primigravida, first trimester: Secondary | ICD-10-CM | POA: Diagnosis not present

## 2020-09-16 ENCOUNTER — Other Ambulatory Visit (HOSPITAL_COMMUNITY): Payer: Self-pay | Admitting: Surgery

## 2020-09-16 DIAGNOSIS — D508 Other iron deficiency anemias: Secondary | ICD-10-CM

## 2020-09-17 ENCOUNTER — Other Ambulatory Visit: Payer: Self-pay

## 2020-09-17 ENCOUNTER — Inpatient Hospital Stay (HOSPITAL_COMMUNITY): Payer: BC Managed Care – PPO | Attending: Hematology

## 2020-09-17 DIAGNOSIS — E538 Deficiency of other specified B group vitamins: Secondary | ICD-10-CM | POA: Diagnosis not present

## 2020-09-17 DIAGNOSIS — O99012 Anemia complicating pregnancy, second trimester: Secondary | ICD-10-CM | POA: Insufficient documentation

## 2020-09-17 DIAGNOSIS — E559 Vitamin D deficiency, unspecified: Secondary | ICD-10-CM | POA: Insufficient documentation

## 2020-09-17 DIAGNOSIS — D508 Other iron deficiency anemias: Secondary | ICD-10-CM

## 2020-09-17 DIAGNOSIS — D509 Iron deficiency anemia, unspecified: Secondary | ICD-10-CM | POA: Diagnosis not present

## 2020-09-17 LAB — COMPREHENSIVE METABOLIC PANEL
ALT: 9 U/L (ref 0–44)
AST: 12 U/L — ABNORMAL LOW (ref 15–41)
Albumin: 3.3 g/dL — ABNORMAL LOW (ref 3.5–5.0)
Alkaline Phosphatase: 76 U/L (ref 38–126)
Anion gap: 8 (ref 5–15)
BUN: 12 mg/dL (ref 6–20)
CO2: 22 mmol/L (ref 22–32)
Calcium: 8.9 mg/dL (ref 8.9–10.3)
Chloride: 105 mmol/L (ref 98–111)
Creatinine, Ser: 0.61 mg/dL (ref 0.44–1.00)
GFR, Estimated: >60 mL/min (ref >60)
Glucose, Bld: 86 mg/dL (ref 70–99)
Potassium: 3.8 mmol/L (ref 3.5–5.1)
Sodium: 135 mmol/L (ref 135–145)
Total Bilirubin: 0.4 mg/dL (ref 0.3–1.2)
Total Protein: 6.4 g/dL — ABNORMAL LOW (ref 6.5–8.1)

## 2020-09-17 LAB — LACTATE DEHYDROGENASE: LDH: 115 U/L (ref 98–192)

## 2020-09-17 LAB — IRON AND TIBC
Iron: 97 ug/dL (ref 28–170)
Saturation Ratios: 26 % (ref 10.4–31.8)
TIBC: 372 ug/dL (ref 250–450)
UIBC: 275 ug/dL

## 2020-09-17 LAB — CBC WITH DIFFERENTIAL/PLATELET
Abs Immature Granulocytes: 0.09 10*3/uL — ABNORMAL HIGH (ref 0.00–0.07)
Basophils Absolute: 0.1 10*3/uL (ref 0.0–0.1)
Basophils Relative: 1 %
Eosinophils Absolute: 0.3 10*3/uL (ref 0.0–0.5)
Eosinophils Relative: 3 %
HCT: 40 % (ref 36.0–46.0)
Hemoglobin: 12.9 g/dL (ref 12.0–15.0)
Immature Granulocytes: 1 %
Lymphocytes Relative: 15 %
Lymphs Abs: 1.4 10*3/uL (ref 0.7–4.0)
MCH: 30.6 pg (ref 26.0–34.0)
MCHC: 32.3 g/dL (ref 30.0–36.0)
MCV: 95 fL (ref 80.0–100.0)
Monocytes Absolute: 0.4 10*3/uL (ref 0.1–1.0)
Monocytes Relative: 4 %
Neutro Abs: 7.5 10*3/uL (ref 1.7–7.7)
Neutrophils Relative %: 76 %
Platelets: 237 10*3/uL (ref 150–400)
RBC: 4.21 MIL/uL (ref 3.87–5.11)
RDW: 12.8 % (ref 11.5–15.5)
WBC: 9.8 10*3/uL (ref 4.0–10.5)
nRBC: 0 % (ref 0.0–0.2)

## 2020-09-17 LAB — FERRITIN: Ferritin: 201 ng/mL (ref 11–307)

## 2020-09-17 LAB — VITAMIN B12: Vitamin B-12: 132 pg/mL — ABNORMAL LOW (ref 180–914)

## 2020-09-17 LAB — FOLATE: Folate: 19.7 ng/mL (ref >5.9)

## 2020-09-19 LAB — VITAMIN D 25 HYDROXY (VIT D DEFICIENCY, FRACTURES): Vit D, 25-Hydroxy: 27.9 ng/mL — ABNORMAL LOW (ref 30–100)

## 2020-09-23 DIAGNOSIS — Z361 Encounter for antenatal screening for raised alphafetoprotein level: Secondary | ICD-10-CM | POA: Diagnosis not present

## 2020-09-23 DIAGNOSIS — E039 Hypothyroidism, unspecified: Secondary | ICD-10-CM | POA: Diagnosis not present

## 2020-09-23 DIAGNOSIS — Z3A16 16 weeks gestation of pregnancy: Secondary | ICD-10-CM | POA: Diagnosis not present

## 2020-09-23 DIAGNOSIS — O09512 Supervision of elderly primigravida, second trimester: Secondary | ICD-10-CM | POA: Diagnosis not present

## 2020-09-24 ENCOUNTER — Encounter (HOSPITAL_COMMUNITY): Payer: Self-pay | Admitting: Hematology

## 2020-09-24 ENCOUNTER — Inpatient Hospital Stay (HOSPITAL_BASED_OUTPATIENT_CLINIC_OR_DEPARTMENT_OTHER): Payer: BC Managed Care – PPO | Admitting: Hematology

## 2020-09-24 ENCOUNTER — Other Ambulatory Visit: Payer: Self-pay

## 2020-09-24 DIAGNOSIS — E538 Deficiency of other specified B group vitamins: Secondary | ICD-10-CM

## 2020-09-24 DIAGNOSIS — D508 Other iron deficiency anemias: Secondary | ICD-10-CM | POA: Diagnosis not present

## 2020-09-24 MED ORDER — CYANOCOBALAMIN 1000 MCG/ML IJ KIT: 1000.0000 ug | PACK | INTRAMUSCULAR | 4 refills | Status: DC

## 2020-09-24 MED ORDER — INSULIN SYRINGES (DISPOSABLE) U-100 1 ML MISC: 6.0000 | 3 refills | Status: DC

## 2020-09-24 MED ORDER — BD HYPODERMIC NEEDLE 22G X 1-1/2" MISC
3 refills | Status: DC
Start: 2020-09-24 — End: 2020-10-24

## 2020-09-24 NOTE — Progress Notes (Signed)
Virtual Visit via Telephone Note  I connected with Alejandra Barr on 09/24/20 at  4:00 PM EST by telephone and verified that I am speaking with the correct person using two identifiers.  Location: Patient: At home Provider: In the office   I discussed the limitations, risks, security and privacy concerns of performing an evaluation and management service by telephone and the availability of in person appointments. I also discussed with the patient that there may be a patient responsible charge related to this service. The patient expressed understanding and agreed to proceed.   History of Present Illness: She is followed in our clinic for iron deficiency anemia, I68 deficiency, folic acid and vitamin D deficiencies. Her last iron infusion was in July of this year.   Observations/Objective: She reports that she is [redacted] weeks pregnant at this time. She is doing well with her pregnancy. She is staying with cultures every 2 weeks. She is taking a multivitamin tablet.  Assessment and Plan:  1. B12 deficiency: -We reviewed B12 levels which were low at 132. -She is taking B12 1 mg every other week. -We will increase B12 to 1 mg every week. -Repeat levels in 10 weeks.  2. Iron deficiency: -Last parenteral iron therapy was in July 2021. -Labs show ferritin 201, percent saturation 26 and hemoglobin 12.9. -Continue oral iron supplements.  3. Folate deficiency: -Folic acid level was normal. Continue folic acid supplements.   Follow Up Instructions: RTC 10 weeks with labs.   I discussed the assessment and treatment plan with the patient. The patient was provided an opportunity to ask questions and all were answered. The patient agreed with the plan and demonstrated an understanding of the instructions.   The patient was advised to call back or seek an in-person evaluation if the symptoms worsen or if the condition fails to improve as anticipated.  I provided 11 minutes of non-face-to-face  time during this encounter.   Derek Jack, MD

## 2020-10-08 DIAGNOSIS — Z3A18 18 weeks gestation of pregnancy: Secondary | ICD-10-CM | POA: Diagnosis not present

## 2020-10-08 DIAGNOSIS — O09819 Supervision of pregnancy resulting from assisted reproductive technology, unspecified trimester: Secondary | ICD-10-CM | POA: Diagnosis not present

## 2020-10-14 DIAGNOSIS — G43701 Chronic migraine without aura, not intractable, with status migrainosus: Secondary | ICD-10-CM | POA: Diagnosis not present

## 2020-10-14 DIAGNOSIS — E538 Deficiency of other specified B group vitamins: Secondary | ICD-10-CM | POA: Diagnosis not present

## 2020-10-14 DIAGNOSIS — G629 Polyneuropathy, unspecified: Secondary | ICD-10-CM | POA: Diagnosis not present

## 2020-10-23 DIAGNOSIS — O99282 Endocrine, nutritional and metabolic diseases complicating pregnancy, second trimester: Secondary | ICD-10-CM | POA: Diagnosis not present

## 2020-10-23 DIAGNOSIS — Z3A20 20 weeks gestation of pregnancy: Secondary | ICD-10-CM | POA: Diagnosis not present

## 2020-10-23 DIAGNOSIS — O09819 Supervision of pregnancy resulting from assisted reproductive technology, unspecified trimester: Secondary | ICD-10-CM | POA: Diagnosis not present

## 2020-10-23 NOTE — Progress Notes (Signed)
Cardiology Office Note  Date: 10/23/2020   ID: Alejandra Barr, Alejandra Barr [birth year], MRN [id number]  PCP:  Fayrene Helper, MD  Cardiologist:  No primary care provider on file. Electrophysiologist:  None   Chief Complaint: Follow-up palpitations, history of atrial flutter, history of RF ablation.  History of Present Illness: Alejandra Barr is a 34 y.o. female with a history of palpitations, atrial flutter, iron deficiency anemia, obesity, asthma, chronic anxiety and depression.  Vitamin B12 deficiency.  Last encounter with Dr. Bronson Ing 12/08/2018.  Previous event monitor demonstrated average heart rate of 99.  Sinus rhythm predominantly seen with isolated PACs and rare couplets.  Occasional PVCs 2.6% and rare couplets.  Ventricular bigeminy and trigeminy were seen.  At that visit she was pregnant with twins.  There was discussion of adding extra 12.5 mg of Toprol-XL every evening after she delivered.  For the time being she was to continue Toprol-XL 37.5 mg daily.   Patient was last seen by Dr. Bronson Ing February 2020 for history of atrial flutter with RF ablation/palpitations.  States she does continue to have palpitations which are intermittent.  She is [redacted] weeks gestation pregnancy.  Otherwise she denies any anginal or exertional symptoms, orthostatic symptoms, CVA or TIA-like symptoms.  Bleeding issues, claudication, DVT or PE-like symptoms, lower extremity edema.  Past Medical History:  Diagnosis Date  . Asthma   . Atrial flutter ([place])    Status post RFA by Dr. Lovena Le in 2004  . Chronic anxiety   . Chronic depression   . 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 Iron deficiency   . Iron deficiency anemia 10/02/2010   Qualifier: Diagnosis of  By: Moshe Cipro MD, Joycelyn Schmid    . Low vitamin B12 level 07/10/2013   Overview:  Last Assessment & Plan:  Being treated through hematology  . Newborn product of in vitro fertilization (IVF) pregnancy   . Obesity   . PONV (postoperative nausea and  vomiting)     Past Surgical History:  Procedure Laterality Date  . Bilateral eustachian tube placement on 4 different occasions    . CARDIAC ELECTROPHYSIOLOGY MAPPING AND ABLATION    . CESAREAN SECTION N/A 05/26/2017   Procedure: CESAREAN SECTION;  Surgeon: Linda Hedges, DO;  Location: [facility];  Service: Obstetrics;  Laterality: N/A;  . CESAREAN SECTION MULTI-GESTATIONAL N/A 02/10/2019   Procedure: Repeat CESAREAN SECTION MULTI-GESTATIONAL;  Surgeon: Brien Few, MD;  Location[facility];  Service: Obstetrics;  Laterality: N/A;  EDD: 03/15/19 Allergy: Penicillin, Morphine, Cipro  . CHOLECYSTECTOMY  2003  . COLONOSCOPY N/A 11/29/2013   Procedure: COLONOSCOPY;  Surgeon: Rogene Houston, MD;  [facility];  Service: Endoscopy;  Laterality: N/A;  120-rescheduled to 300 Ann notified pt  . GASTRIC BYPASS  2006  . TONSILLECTOMY  1990    Current Outpatient Medications  Medication Sig Dispense Refill  . BD DISP NEEDLES 22G X 1-1/2" MISC 1 ONCE DAILY    . butalbital-acetaminophen-caffeine (FIORICET) 50-325-40 MG tablet TK 2 TS PO ONCE A DAY PRN    . cyanocobalamin (,VITAMIN B-12,) 1000 MCG/ML injection     . Cyanocobalamin 1000 MCG/ML KIT Inject 1,000 mcg into the muscle every 7 (seven) days. 1 kit 4  . DOTTI 0.1 MG/24HR patch 1 patch every 3 (three) days.    . ergocalciferol (VITAMIN D2) 1.25 MG (50000 UT) capsule Take 1 capsule (50,000 Units total) by mouth once a week. 16 capsule 4  . estradiol (ESTRACE) 2 MG tablet Take  by mouth.    . estradiol (ESTRACE) 2 MG tablet Take by mouth.    . estradiol (VIVELLE-DOT) 0.1 MG/24HR patch Place patch on abdomen or upper buttocks and change every 3 days.    . ferrous sulfate 300 (60 Fe) MG/5ML syrup Take 5 mLs (300 mg total) by mouth 2 (two) times daily with a meal. Mix in 4oz of orange juice (Patient not taking: Reported on 8/50/2774) 128 mL 3  . folic acid (FOLVITE) 786 MCG tablet Take 1 tablet (800 mcg total) by mouth daily. 90  tablet 3  . Insulin Syringes, Disposable, U-100 1 ML MISC 1 mL by Does not apply route every 7 (seven) days. 60 each 1  . Insulin Syringes, Disposable, U-100 1 ML MISC 6 Syringes by Does not apply route every 30 (thirty) days. 12 each 3  . levothyroxine (SYNTHROID) 25 MCG tablet Take by mouth.    . methylPREDNISolone (MEDROL) 8 MG tablet Take 8 mg by mouth 2 (two) times daily.    . metoprolol succinate (TOPROL-XL) 25 MG 24 hr tablet TAKE ONE AND ONE-HALF TABLETS BY MOUTH DAILY 135 tablet 2  . NEEDLE, DISP, 22 G (B-D HYPODERMIC NEEDLE 22GX1.5") 22G X 1-1/2" MISC 1 each by Misc.(Non-Drug; Combo Route) route daily. 12 each 3  . Prenatal Vit-Fe Fumarate-FA (PRENATAL PO) Take 1 tablet by mouth daily.    . progesterone 50 MG/ML injection Inject 50 mg into the muscle daily.    Marland Kitchen senna-docusate (SENOKOT-S) 8.6-50 MG tablet Take 2 tablets by mouth daily. (Patient not taking: Reported on 02/15/2020)    . topiramate (TOPAMAX) 100 MG tablet TK 1 T PO BID     No current facility-administered medications for this visit.   Allergies:  Ciprofloxacin, Morphine and related, and Penicillins   Social History: The patient  reports that she quit smoking about 5 years ago. Her smoking use included cigarettes. She started smoking about 7 years ago. She smoked 0.50 packs per day. She has never used smokeless tobacco. She reports that she does not drink alcohol and does not use drugs.   Family History: The patient's family history includes Arrhythmia in her mother; Breast cancer (age of onset: 39) in her mother; Diabetes in her maternal grandmother; Fibromyalgia in her mother; Heart defect in her mother; Heart disease in her paternal grandfather and paternal grandmother; Hypertension in her maternal grandfather, maternal grandmother, paternal grandfather, and paternal grandmother; Hypothyroidism in her mother; Obesity in her mother.   ROS:  Please see the history of present illness. Otherwise, complete review of systems is  positive for none.  All other systems are reviewed and negative.   Physical Exam: VS:  There were no vitals taken for this visit., BMI There is no height or weight on file to calculate BMI.  Wt Readings from Last 3 Encounters:  02/15/20 205 lb (93 kg)  02/20/19 240 lb 1.3 oz (108.9 kg)  02/06/19 240 lb (108.9 kg)    General: Patient appears comfortable at rest. Neck: Supple, no elevated JVP or carotid bruits, no thyromegaly. Lungs: Clear to auscultation, nonlabored breathing at rest. Cardiac: Regular rate and rhythm, no S3 or significant systolic murmur, no pericardial rub. Extremities: No pitting edema, distal pulses 2+. Skin: Warm and dry. Musculoskeletal: No kyphosis. Neuropsychiatric: Alert and oriented x3, affect grossly appropriate.  ECG:  An ECG dated 10/24/2020 was personally reviewed today and demonstrated:  Sinus rhythm rate of 94.  Recent Labwork: 02/02/2020: TSH 1.544 09/17/2020: ALT 9; AST 12; BUN 12; Creatinine, Ser  0.61; Hemoglobin 12.9; Platelets 237; Potassium 3.8; Sodium 135     Component Value Date/Time   CHOL 172 05/10/2018 0805   TRIG 94 05/10/2018 0805   HDL 63 05/10/2018 0805   CHOLHDL 2.7 05/10/2018 0805   VLDL 26 09/18/2015 0806   LDLCALC 90 05/10/2018 0805    Other Studies Reviewed Today:   Assessment and Plan:  1. Palpitations   2. History of atrial flutter    1. Palpitations Still complaining of intermittent palpitations in spite of being on 37.5 mg of Toprol-XL.  Increase Toprol-XL to 50 mg daily.  2. History of atrial flutter EKG today shows normal sinus rhythm  Medication Adjustments/Labs and Tests Ordered: Current medicines are reviewed at length with the patient today.  Concerns regarding medicines are outlined above.   Disposition: Follow-up with Dr. Harl Bowie or APP 1 year  Signed, Levell July, NP 10/23/2020 1:02 PM    [facility] [place], [place] [place], [place] [address] Phone: [phone]; Fax:  [phone]

## 2020-10-24 ENCOUNTER — Ambulatory Visit (INDEPENDENT_AMBULATORY_CARE_PROVIDER_SITE_OTHER): Payer: BC Managed Care – PPO | Admitting: Family Medicine

## 2020-10-24 ENCOUNTER — Encounter: Payer: Self-pay | Admitting: Family Medicine

## 2020-10-24 VITALS — BP 110/70 | HR 84 | Ht 61.0 in | Wt 253.6 lb

## 2020-10-24 DIAGNOSIS — R002 Palpitations: Secondary | ICD-10-CM

## 2020-10-24 DIAGNOSIS — Z8679 Personal history of other diseases of the circulatory system: Secondary | ICD-10-CM

## 2020-10-24 MED ORDER — METOPROLOL SUCCINATE ER 25 MG PO TB24
50.0000 mg | ORAL_TABLET | Freq: Every day | ORAL | 2 refills | Status: DC
Start: 2020-10-24 — End: 2021-06-02

## 2020-10-24 NOTE — Patient Instructions (Addendum)
Medication Instructions:   Your physician has recommended you make the following change in your medication:   Increase metoprolol succinate to 50 mg daily'  Continue other medications the same  Labwork:  None  Testing/Procedures:  None  Follow-Up:  Your physician recommends that you schedule a follow-up appointment in: 1 year. You will receive a reminder letter in the mail in about 10 months reminding you to call and schedule your appointment. If you don't receive this letter, please contact our office.  Any Other Special Instructions Will Be Listed Below (If Applicable).  If you need a refill on your cardiac medications before your next appointment, please call your pharmacy.

## 2020-10-30 DIAGNOSIS — Z3A21 21 weeks gestation of pregnancy: Secondary | ICD-10-CM | POA: Diagnosis not present

## 2020-10-30 DIAGNOSIS — O99283 Endocrine, nutritional and metabolic diseases complicating pregnancy, third trimester: Secondary | ICD-10-CM | POA: Diagnosis not present

## 2020-10-30 DIAGNOSIS — I471 Supraventricular tachycardia: Secondary | ICD-10-CM | POA: Diagnosis not present

## 2020-10-30 DIAGNOSIS — Z362 Encounter for other antenatal screening follow-up: Secondary | ICD-10-CM | POA: Diagnosis not present

## 2020-10-30 DIAGNOSIS — Z3189 Encounter for other procreative management: Secondary | ICD-10-CM | POA: Diagnosis not present

## 2020-10-30 DIAGNOSIS — O09812 Supervision of pregnancy resulting from assisted reproductive technology, second trimester: Secondary | ICD-10-CM | POA: Diagnosis not present

## 2020-10-30 DIAGNOSIS — O99412 Diseases of the circulatory system complicating pregnancy, second trimester: Secondary | ICD-10-CM | POA: Diagnosis not present

## 2020-11-05 DIAGNOSIS — O99282 Endocrine, nutritional and metabolic diseases complicating pregnancy, second trimester: Secondary | ICD-10-CM | POA: Diagnosis not present

## 2020-11-05 DIAGNOSIS — I471 Supraventricular tachycardia: Secondary | ICD-10-CM | POA: Diagnosis not present

## 2020-11-05 DIAGNOSIS — Z3A22 22 weeks gestation of pregnancy: Secondary | ICD-10-CM | POA: Diagnosis not present

## 2020-11-05 DIAGNOSIS — O99412 Diseases of the circulatory system complicating pregnancy, second trimester: Secondary | ICD-10-CM | POA: Diagnosis not present

## 2020-11-05 DIAGNOSIS — O26879 Cervical shortening, unspecified trimester: Secondary | ICD-10-CM | POA: Diagnosis not present

## 2020-11-12 DIAGNOSIS — Z3A23 23 weeks gestation of pregnancy: Secondary | ICD-10-CM | POA: Diagnosis not present

## 2020-11-12 DIAGNOSIS — O26872 Cervical shortening, second trimester: Secondary | ICD-10-CM | POA: Diagnosis not present

## 2020-11-26 DIAGNOSIS — G43711 Chronic migraine without aura, intractable, with status migrainosus: Secondary | ICD-10-CM | POA: Diagnosis not present

## 2020-11-26 DIAGNOSIS — G43719 Chronic migraine without aura, intractable, without status migrainosus: Secondary | ICD-10-CM | POA: Diagnosis not present

## 2020-11-26 DIAGNOSIS — O99282 Endocrine, nutritional and metabolic diseases complicating pregnancy, second trimester: Secondary | ICD-10-CM | POA: Diagnosis not present

## 2020-11-26 DIAGNOSIS — I471 Supraventricular tachycardia: Secondary | ICD-10-CM | POA: Diagnosis not present

## 2020-11-26 DIAGNOSIS — O99412 Diseases of the circulatory system complicating pregnancy, second trimester: Secondary | ICD-10-CM | POA: Diagnosis not present

## 2020-11-26 DIAGNOSIS — Z3A25 25 weeks gestation of pregnancy: Secondary | ICD-10-CM | POA: Diagnosis not present

## 2020-11-27 DIAGNOSIS — O99412 Diseases of the circulatory system complicating pregnancy, second trimester: Secondary | ICD-10-CM | POA: Diagnosis not present

## 2020-11-27 DIAGNOSIS — I471 Supraventricular tachycardia: Secondary | ICD-10-CM | POA: Diagnosis not present

## 2020-11-27 DIAGNOSIS — O99282 Endocrine, nutritional and metabolic diseases complicating pregnancy, second trimester: Secondary | ICD-10-CM | POA: Diagnosis not present

## 2020-11-27 DIAGNOSIS — Z3A25 25 weeks gestation of pregnancy: Secondary | ICD-10-CM | POA: Diagnosis not present

## 2020-11-29 DIAGNOSIS — G43701 Chronic migraine without aura, not intractable, with status migrainosus: Secondary | ICD-10-CM | POA: Diagnosis not present

## 2020-12-04 ENCOUNTER — Inpatient Hospital Stay (HOSPITAL_COMMUNITY): Payer: BC Managed Care – PPO | Attending: Hematology

## 2020-12-04 ENCOUNTER — Other Ambulatory Visit: Payer: Self-pay

## 2020-12-04 DIAGNOSIS — D508 Other iron deficiency anemias: Secondary | ICD-10-CM

## 2020-12-04 DIAGNOSIS — D509 Iron deficiency anemia, unspecified: Secondary | ICD-10-CM | POA: Insufficient documentation

## 2020-12-04 DIAGNOSIS — O99013 Anemia complicating pregnancy, third trimester: Secondary | ICD-10-CM | POA: Insufficient documentation

## 2020-12-04 DIAGNOSIS — E559 Vitamin D deficiency, unspecified: Secondary | ICD-10-CM | POA: Insufficient documentation

## 2020-12-04 DIAGNOSIS — E538 Deficiency of other specified B group vitamins: Secondary | ICD-10-CM | POA: Diagnosis not present

## 2020-12-04 LAB — CBC WITH DIFFERENTIAL/PLATELET
Abs Immature Granulocytes: 0.13 10*3/uL — ABNORMAL HIGH (ref 0.00–0.07)
Basophils Absolute: 0.1 10*3/uL (ref 0.0–0.1)
Basophils Relative: 1 %
Eosinophils Absolute: 0.2 10*3/uL (ref 0.0–0.5)
Eosinophils Relative: 2 %
HCT: 35.1 % — ABNORMAL LOW (ref 36.0–46.0)
Hemoglobin: 11.7 g/dL — ABNORMAL LOW (ref 12.0–15.0)
Immature Granulocytes: 1 %
Lymphocytes Relative: 14 %
Lymphs Abs: 1.5 10*3/uL (ref 0.7–4.0)
MCH: 31 pg (ref 26.0–34.0)
MCHC: 33.3 g/dL (ref 30.0–36.0)
MCV: 92.9 fL (ref 80.0–100.0)
Monocytes Absolute: 0.5 10*3/uL (ref 0.1–1.0)
Monocytes Relative: 5 %
Neutro Abs: 7.9 10*3/uL — ABNORMAL HIGH (ref 1.7–7.7)
Neutrophils Relative %: 77 %
Platelets: 211 10*3/uL (ref 150–400)
RBC: 3.78 MIL/uL — ABNORMAL LOW (ref 3.87–5.11)
RDW: 13.9 % (ref 11.5–15.5)
WBC: 10.2 10*3/uL (ref 4.0–10.5)
nRBC: 0 % (ref 0.0–0.2)

## 2020-12-04 LAB — IRON AND TIBC
Iron: 93 ug/dL (ref 28–170)
Saturation Ratios: 23 % (ref 10.4–31.8)
TIBC: 403 ug/dL (ref 250–450)
UIBC: 310 ug/dL

## 2020-12-04 LAB — COMPREHENSIVE METABOLIC PANEL
ALT: 6 U/L (ref 0–44)
AST: 10 U/L — ABNORMAL LOW (ref 15–41)
Albumin: 2.8 g/dL — ABNORMAL LOW (ref 3.5–5.0)
Alkaline Phosphatase: 91 U/L (ref 38–126)
Anion gap: 6 (ref 5–15)
BUN: 7 mg/dL (ref 6–20)
CO2: 21 mmol/L — ABNORMAL LOW (ref 22–32)
Calcium: 8.4 mg/dL — ABNORMAL LOW (ref 8.9–10.3)
Chloride: 107 mmol/L (ref 98–111)
Creatinine, Ser: 0.55 mg/dL (ref 0.44–1.00)
GFR, Estimated: >60 mL/min (ref >60)
Glucose, Bld: 89 mg/dL (ref 70–99)
Potassium: 4 mmol/L (ref 3.5–5.1)
Sodium: 134 mmol/L — ABNORMAL LOW (ref 135–145)
Total Bilirubin: 0.3 mg/dL (ref 0.3–1.2)
Total Protein: 6.1 g/dL — ABNORMAL LOW (ref 6.5–8.1)

## 2020-12-04 LAB — VITAMIN B12: Vitamin B-12: 166 pg/mL — ABNORMAL LOW (ref 180–914)

## 2020-12-04 LAB — FERRITIN: Ferritin: 76 ng/mL (ref 11–307)

## 2020-12-05 DIAGNOSIS — I471 Supraventricular tachycardia: Secondary | ICD-10-CM | POA: Diagnosis not present

## 2020-12-05 DIAGNOSIS — O99282 Endocrine, nutritional and metabolic diseases complicating pregnancy, second trimester: Secondary | ICD-10-CM | POA: Diagnosis not present

## 2020-12-05 DIAGNOSIS — Z3A26 26 weeks gestation of pregnancy: Secondary | ICD-10-CM | POA: Diagnosis not present

## 2020-12-05 DIAGNOSIS — O99412 Diseases of the circulatory system complicating pregnancy, second trimester: Secondary | ICD-10-CM | POA: Diagnosis not present

## 2020-12-11 ENCOUNTER — Inpatient Hospital Stay (HOSPITAL_BASED_OUTPATIENT_CLINIC_OR_DEPARTMENT_OTHER): Payer: BC Managed Care – PPO | Admitting: Hematology

## 2020-12-11 ENCOUNTER — Other Ambulatory Visit: Payer: Self-pay

## 2020-12-11 VITALS — BP 91/49 | HR 88 | Temp 98.9°F | Resp 18 | Wt 262.0 lb

## 2020-12-11 DIAGNOSIS — D508 Other iron deficiency anemias: Secondary | ICD-10-CM | POA: Diagnosis not present

## 2020-12-11 DIAGNOSIS — D509 Iron deficiency anemia, unspecified: Secondary | ICD-10-CM | POA: Diagnosis not present

## 2020-12-11 DIAGNOSIS — O99013 Anemia complicating pregnancy, third trimester: Secondary | ICD-10-CM | POA: Diagnosis not present

## 2020-12-11 DIAGNOSIS — E559 Vitamin D deficiency, unspecified: Secondary | ICD-10-CM

## 2020-12-11 DIAGNOSIS — E538 Deficiency of other specified B group vitamins: Secondary | ICD-10-CM

## 2020-12-11 MED ORDER — CYANOCOBALAMIN 1000 MCG/ML IJ KIT: 1000.0000 ug | PACK | INTRAMUSCULAR | 4 refills | Status: DC

## 2020-12-11 MED ORDER — VITAMIN D (ERGOCALCIFEROL) 1.25 MG (50000 UNIT) PO CAPS: 50000.0000 [IU] | ORAL_CAPSULE | ORAL | 4 refills | Status: DC

## 2020-12-11 NOTE — Patient Instructions (Signed)
[place] at [facility] Discharge Instructions  You were seen today by Dr. Delton Coombes. He went over your recent results. You will be scheduled for 1 Feraheme infusion. Dr. Delton Coombes will call you in 2 months for labs and follow up.   Thank you for choosing [place] at [facility] to provide your oncology and hematology care.  To afford each patient quality time with our provider, please arrive at least 15 minutes before your scheduled appointment time.   If you have a lab appointment with the [facility] please come in thru the Main Entrance and check in at the main information desk  You need to re-schedule your appointment should you arrive 10 or more minutes late.  We strive to give you quality time with our providers, and arriving late affects you and other patients whose appointments are after yours.  Also, if you no show three or more times for appointments you may be dismissed from the clinic at the providers discretion.     Again, thank you for choosing [facility].  Our hope is that these requests will decrease the amount of time that you wait before being seen by our physicians.       _____________________________________________________________  Should you have questions after your visit to [facility], please contact our office at [phone] between the hours of 8:00 a.m. and 4:30 p.m.  Voicemails left after 4:00 p.m. will not be returned until the following business day.  For prescription refill requests, have your pharmacy contact our office and allow 72 hours.    Cancer Center Support Programs:   > Cancer Support Group  2nd Tuesday of the month 1pm-2pm, Journey Room

## 2020-12-11 NOTE — Progress Notes (Signed)
Alejandra Barr, Alejandra Barr   CLINIC:  Medical Oncology/Hematology  PCP:  Fayrene Helper, MD [address] / [place] [address]  [phone]  REASON FOR VISIT:  Follow-up for IDA and vitamin B12 & D deficiency  PRIOR THERAPY: None  CURRENT THERAPY: Intermittent Feraheme last on 05/03/2020; ferrous sulfate liquid BID  INTERVAL HISTORY:  Alejandra Barr, a 35 y.o. female, returns for routine follow-up for her IDA and vitamin B12 and D deficiency. Alejandra Barr was last contacted via telephone on 09/24/2020.  Today Alejandra Barr reports feeling okay. Alejandra Barr is currently pregnant and will be [redacted] weeks pregnant on 02/11. Alejandra Barr is taking vitamin B12 1 mg injection and iron liquid BID. Alejandra Barr will get constipated after taking the iron for a week, which is manageable. Her energy level is depleted most days as Alejandra Barr has 3 toddlers to take care of at home.   REVIEW OF SYSTEMS:  Review of Systems  Constitutional: Positive for fatigue (depleted). Negative for appetite change.  Gastrointestinal: Positive for constipation (on iron).  All other systems reviewed and are negative.   PAST MEDICAL/SURGICAL HISTORY:  Past Medical History:  Diagnosis Date  . Asthma   . Atrial flutter ([place])    Status post RFA by Dr. Lovena Le in 2004  . Chronic anxiety   . Chronic depression   . 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 Iron deficiency   . Iron deficiency anemia 10/02/2010   Qualifier: Diagnosis of  By: Moshe Cipro MD, Joycelyn Schmid    . Low vitamin B12 level 07/10/2013   Overview:  Last Assessment & Plan:  Being treated through hematology  . Newborn product of in vitro fertilization (IVF) pregnancy   . Obesity   . PONV (postoperative nausea and vomiting)    Past Surgical History:  Procedure Laterality Date  . Bilateral eustachian tube placement on 4 different occasions    . CARDIAC ELECTROPHYSIOLOGY MAPPING AND ABLATION    . CESAREAN SECTION N/A 05/26/2017   Procedure:  CESAREAN SECTION;  Surgeon: Linda Hedges, DO;  Location: [facility];  Service: Obstetrics;  Laterality: N/A;  . CESAREAN SECTION MULTI-GESTATIONAL N/A 02/10/2019   Procedure: Repeat CESAREAN SECTION MULTI-GESTATIONAL;  Surgeon: Brien Few, MD;  Location: [facility];  Service: Obstetrics;  Laterality: N/A;  EDD: 03/15/19 Allergy: Penicillin, Morphine, Cipro  . CHOLECYSTECTOMY  2003  . COLONOSCOPY N/A 11/29/2013   Procedure: COLONOSCOPY;  Surgeon: Rogene Houston, MD;  [facility]: [facility];  Service: Endoscopy;  Laterality: N/A;  120-rescheduled to [address] notified pt  . GASTRIC BYPASS  2006  . TONSILLECTOMY  1990    SOCIAL HISTORY:  Social History   Socioeconomic History  . Marital status: Married    Spouse name: Not on file  . Number of children: Not on file  . Years of education: Not on file  . Highest education level: Not on file  Occupational History  . Occupation: Nurse     Employer: [employer]  Tobacco Use  . Smoking status: Former Smoker    Packs/day: 0.50    Types: Cigarettes    Start date: 03/21/2013    Quit date: 06/02/2015    Years since quitting: 5.5  . Smokeless tobacco: Never Used  Vaping Use  . Vaping Use: Never used  Substance and Sexual Activity  . Alcohol use: No    Alcohol/week: 0.0 standard drinks    Comment: sober alcoholic 6 years  . Drug use: No  .  Sexual activity: Not Currently    Birth control/protection: None    Comment: husband had vasectomy  Other Topics Concern  . Not on file  Social History Narrative  . Not on file   Social Determinants of Health   Financial Resource Strain: Not on file  Food Insecurity: Not on file  Transportation Needs: Not on file  Physical Activity: Not on file  Stress: Not on file  Social Connections: Not on file  Intimate Partner Violence: Not on file    FAMILY HISTORY:  Family History  Problem Relation Age of Onset  . Fibromyalgia Mother        Chronic pelvic pain   . Hypothyroidism Mother   .  Obesity Mother   . Heart defect Mother        Supraventricular, tachycardia /tachypalpation   . Arrhythmia Mother   . Breast cancer Mother 43  . Hypertension Maternal Grandmother   . Diabetes Maternal Grandmother   . Hypertension Maternal Grandfather   . Heart disease Paternal Grandmother   . Hypertension Paternal Grandmother   . Heart disease Paternal Grandfather   . Hypertension Paternal Grandfather     CURRENT MEDICATIONS:  Current Outpatient Medications  Medication Sig Dispense Refill  . cyanocobalamin (,VITAMIN B-12,) 1000 MCG/ML injection     . ferrous sulfate 300 (60 Fe) MG/5ML syrup Take 5 mLs (300 mg total) by mouth 2 (two) times daily with a meal. Mix in 4oz of orange juice 916 mL 3  . folic acid (FOLVITE) 945 MCG tablet Take 1 tablet (800 mcg total) by mouth daily. 90 tablet 3  . levothyroxine (SYNTHROID) 75 MCG tablet Take 75 mcg by mouth daily.    . metoprolol succinate (TOPROL-XL) 25 MG 24 hr tablet Take 2 tablets (50 mg total) by mouth daily. 180 tablet 2  . Prenatal Vit-Fe Fumarate-FA (PRENATAL PO) Take 1 tablet by mouth daily.    . progesterone (PROMETRIUM) 200 MG capsule Take 200 mg by mouth at bedtime.    . senna-docusate (SENOKOT-S) 8.6-50 MG tablet Take 2 tablets by mouth daily.    . Vitamin D, Ergocalciferol, (DRISDOL) 1.25 MG (50000 UNIT) CAPS capsule Take 1 capsule (50,000 Units total) by mouth once a week. 5 capsule 4  . Cyanocobalamin 1000 MCG/ML KIT Inject 1,000 mcg into the muscle every 7 (seven) days. 1 kit 4   No current facility-administered medications for this visit.    ALLERGIES:  Allergies  Allergen Reactions  . Ciprofloxacin Anaphylaxis  . Morphine And Related Anaphylaxis  . Penicillins Anaphylaxis    Has patient had a PCN reaction causing immediate rash, facial/tongue/throat swelling, SOB or lightheadedness with hypotension: No Has patient had a PCN reaction causing severe rash involving mucus membranes or skin necrosis: No Has patient had a  PCN reaction that required hospitalization: No Has patient had a PCN reaction occurring within the last 10 years: No If all of the above answers are "NO", then may proceed with Cephalosporin use.    PHYSICAL EXAM:  Performance status (ECOG): 1 - Symptomatic but completely ambulatory  Vitals:   12/11/20 1423  BP: (!) 91/49  Pulse: 88  Resp: 18  Temp: 98.9 F (37.2 C)  SpO2: 99%   Wt Readings from Last 3 Encounters:  12/11/20 262 lb (118.8 kg)  10/24/20 253 lb 9.6 oz (115 kg)  02/15/20 205 lb (93 kg)   Physical Exam Vitals reviewed.  Constitutional:      Appearance: Normal appearance.  Neurological:     General: No  focal deficit present.     Mental Status: Alejandra Barr is alert and oriented to person, place, and time.  Psychiatric:        Mood and Affect: Mood normal.        Behavior: Behavior normal.     LABORATORY DATA:  I have reviewed the labs as listed.  CBC Latest Ref Rng & Units 12/04/2020 09/17/2020 07/15/2020  WBC 4.0 - 10.5 K/uL 10.2 9.8 9.0  Hemoglobin 12.0 - 15.0 g/dL 11.7(L) 12.9 12.5  Hematocrit 36.0 - 46.0 % 35.1(L) 40.0 39.4  Platelets 150 - 400 K/uL 211 237 287   CMP Latest Ref Rng & Units 12/04/2020 09/17/2020 07/15/2020  Glucose 70 - 99 mg/dL 89 86 78  BUN 6 - 20 mg/dL _0 Creatinine 0.44 - 1.00 mg/dL 0.55 0.61 0.61  Sodium 135 - 145 mmol/L 134(L) 135 137  Potassium 3.5 - 5.1 mmol/L 4.0 3.8 3.8  Chloride 98 - 111 mmol/L 107 105 106  CO2 22 - 32 mmol/L 21(L) 22 22  Calcium 8.9 - 10.3 mg/dL 8.4(L) 8.9 9.1  Total Protein 6.5 - 8.1 g/dL 6.1(L) 6.4(L) 6.5  Total Bilirubin 0.3 - 1.2 mg/dL 0.3 0.4 0.5  Alkaline Phos 38 - 126 U/L 91 76 85  AST 15 - 41 U/L 10(L) 12(L) 13(L)  ALT 0 - 44 U/L _1 Component Value Date/Time   RBC 3.78 (L) 12/04/2020 0909   MCV 92.9 12/04/2020 0909   MCH 31.0 12/04/2020 0909   MCHC 33.3 12/04/2020 0909   RDW 13.9 12/04/2020 0909   LYMPHSABS 1.5 12/04/2020 0909   MONOABS 0.5 12/04/2020 0909   EOSABS 0.2 12/04/2020  0909   BASOSABS 0.1 12/04/2020 0909    DIAGNOSTIC IMAGING:  I have independently reviewed the scans and discussed with the patient. No results found.   ASSESSMENT:  1. B12 deficiency: -Alejandra Barr is currently on B12 injections.  2. Iron deficiency: -Last parenteral iron therapy was in July 2021.   PLAN:  1. B12 deficiency: -Continue B12 injection weekly. -B12 is still in the low normal levels.  2. Iron deficiency: -Alejandra Barr is [redacted] weeks pregnant. -Alejandra Barr is taking iron tablet daily. -Reviewed labs from 12/04/2020.  Ferritin is down to 76.  Hemoglobin 11.7. -Alejandra Barr complains of feeling tired.  Recommend 1 infusion of Feraheme. -Repeat labs in 2 months with phone visit.  3. Folate deficiency: -Continue folic acid supplements.   Orders placed this encounter:  No orders of the defined types were placed in this encounter.    Derek Jack, MD [facility] [phone]   I, Milinda Antis, am acting as a scribe for Dr. Sanda Linger.  I, Derek Jack MD, have reviewed the above documentation for accuracy and completeness, and I agree with the above.

## 2020-12-12 DIAGNOSIS — O09812 Supervision of pregnancy resulting from assisted reproductive technology, second trimester: Secondary | ICD-10-CM | POA: Diagnosis not present

## 2020-12-12 DIAGNOSIS — Z3A27 27 weeks gestation of pregnancy: Secondary | ICD-10-CM | POA: Diagnosis not present

## 2020-12-12 NOTE — Progress Notes (Signed)
Intravenous Iron Formulation Change  Alejandra Barr has insurance that requires a change in intravenous iron product from Feraheme to Venofer. Orders have been updated to reflect this change and scheduling message sent to adjust infusion appointments. Dr Delton Coombes notified and agrees with the plan.  Allergies:  Allergies  Allergen Reactions  . Ciprofloxacin Anaphylaxis  . Morphine And Related Anaphylaxis  . Penicillins Anaphylaxis    Has patient had a PCN reaction causing immediate rash, facial/tongue/throat swelling, SOB or lightheadedness with hypotension: No Has patient had a PCN reaction causing severe rash involving mucus membranes or skin necrosis: No Has patient had a PCN reaction that required hospitalization: No Has patient had a PCN reaction occurring within the last 10 years: No If all of the above answers are "NO", then may proceed with Cephalosporin use.    The plan for iron therapy is as follows: Venofer 300 mg IVPB weekly x 2 doses.  Elsie Lincoln, PharmD 12/12/2020

## 2020-12-13 ENCOUNTER — Inpatient Hospital Stay (HOSPITAL_COMMUNITY): Payer: BC Managed Care – PPO

## 2020-12-13 ENCOUNTER — Other Ambulatory Visit: Payer: Self-pay

## 2020-12-13 ENCOUNTER — Encounter (HOSPITAL_COMMUNITY): Payer: Self-pay

## 2020-12-13 VITALS — BP 111/50 | HR 74 | Temp 97.0°F | Resp 18

## 2020-12-13 DIAGNOSIS — D509 Iron deficiency anemia, unspecified: Secondary | ICD-10-CM | POA: Diagnosis not present

## 2020-12-13 DIAGNOSIS — O99013 Anemia complicating pregnancy, third trimester: Secondary | ICD-10-CM | POA: Diagnosis not present

## 2020-12-13 DIAGNOSIS — E559 Vitamin D deficiency, unspecified: Secondary | ICD-10-CM | POA: Diagnosis not present

## 2020-12-13 DIAGNOSIS — E538 Deficiency of other specified B group vitamins: Secondary | ICD-10-CM | POA: Diagnosis not present

## 2020-12-13 DIAGNOSIS — D508 Other iron deficiency anemias: Secondary | ICD-10-CM

## 2020-12-13 MED ORDER — ACETAMINOPHEN 325 MG PO TABS
650.0000 mg | ORAL_TABLET | Freq: Once | ORAL | Status: AC
  Administered 2020-12-13: 650 mg via ORAL

## 2020-12-13 MED ORDER — SODIUM CHLORIDE 0.9 % IV SOLN
300.0000 mg | Freq: Once | INTRAVENOUS | Status: AC
  Administered 2020-12-13: 300 mg via INTRAVENOUS
  Filled 2020-12-13: qty 15

## 2020-12-13 MED ORDER — PEGFILGRASTIM-CBQV 6 MG/0.6ML ~~LOC~~ SOSY
PREFILLED_SYRINGE | SUBCUTANEOUS | Status: AC
  Filled 2020-12-13: qty 0.6

## 2020-12-13 MED ORDER — SODIUM CHLORIDE 0.9 % IV SOLN: Freq: Once | INTRAVENOUS | Status: AC

## 2020-12-13 MED ORDER — FAMOTIDINE 20 MG PO TABS
ORAL_TABLET | ORAL | Status: AC
  Filled 2020-12-13: qty 1

## 2020-12-13 MED ORDER — ACETAMINOPHEN 325 MG PO TABS
ORAL_TABLET | ORAL | Status: AC
  Filled 2020-12-13: qty 2

## 2020-12-13 MED ORDER — LORATADINE 10 MG PO TABS
10.0000 mg | ORAL_TABLET | Freq: Once | ORAL | Status: AC
  Administered 2020-12-13: 10 mg via ORAL

## 2020-12-13 MED ORDER — LORATADINE 10 MG PO TABS
ORAL_TABLET | ORAL | Status: AC
  Filled 2020-12-13: qty 1

## 2020-12-13 MED ORDER — FAMOTIDINE 20 MG PO TABS
20.0000 mg | ORAL_TABLET | Freq: Once | ORAL | Status: AC
  Administered 2020-12-13: 20 mg via ORAL

## 2020-12-13 NOTE — Patient Instructions (Signed)
[facility] at [facility]  Discharge Instructions:   _______________________________________________________________  Thank you for choosing [facility] at [facility] to provide your oncology and hematology care.  To afford each patient quality time with our providers, please arrive at least 15 minutes before your scheduled appointment.  You need to re-schedule your appointment if you arrive 10 or more minutes late.  We strive to give you quality time with our providers, and arriving late affects you and other patients whose appointments are after yours.  Also, if you no show three or more times for appointments you may be dismissed from the clinic.  Again, thank you for choosing [facility] at [facility]. Our hope is that these requests will allow you access to exceptional care and in a timely manner. _______________________________________________________________  If you have questions after your visit, please contact our office at [phone] between the hours of 8:30 a.m. and 5:00 p.m. Voicemails left after 4:30 p.m. will not be returned until the following business day. _______________________________________________________________  For prescription refill requests, have your pharmacy contact our office. _______________________________________________________________  Recommendations made by the consultant and any test results will be sent to your referring physician. _______________________________________________________________ 

## 2020-12-13 NOTE — Progress Notes (Signed)
Patient presents today for Venofer infusion. Vital signs stable. Patient has no complaints of any pain today. Patient denies any changes since her last visit.   Treatment given today per MD orders. Tolerated infusion without adverse affects. Vital signs stable. No complaints at this time. Discharged from clinic ambulatory in stable condition. Alert and oriented x 3. F/U with [facility] as scheduled.

## 2020-12-20 ENCOUNTER — Inpatient Hospital Stay (HOSPITAL_COMMUNITY): Payer: BC Managed Care – PPO

## 2020-12-20 ENCOUNTER — Encounter (HOSPITAL_COMMUNITY): Payer: Self-pay

## 2020-12-20 ENCOUNTER — Other Ambulatory Visit: Payer: Self-pay

## 2020-12-20 VITALS — BP 113/59 | HR 84 | Temp 97.1°F | Resp 18 | Wt 258.8 lb

## 2020-12-20 DIAGNOSIS — O24414 Gestational diabetes mellitus in pregnancy, insulin controlled: Secondary | ICD-10-CM | POA: Diagnosis not present

## 2020-12-20 DIAGNOSIS — O99413 Diseases of the circulatory system complicating pregnancy, third trimester: Secondary | ICD-10-CM | POA: Diagnosis not present

## 2020-12-20 DIAGNOSIS — I471 Supraventricular tachycardia: Secondary | ICD-10-CM | POA: Diagnosis not present

## 2020-12-20 DIAGNOSIS — Z3689 Encounter for other specified antenatal screening: Secondary | ICD-10-CM | POA: Diagnosis not present

## 2020-12-20 DIAGNOSIS — D508 Other iron deficiency anemias: Secondary | ICD-10-CM

## 2020-12-20 DIAGNOSIS — Z3A29 29 weeks gestation of pregnancy: Secondary | ICD-10-CM | POA: Diagnosis not present

## 2020-12-20 DIAGNOSIS — E538 Deficiency of other specified B group vitamins: Secondary | ICD-10-CM | POA: Diagnosis not present

## 2020-12-20 DIAGNOSIS — E559 Vitamin D deficiency, unspecified: Secondary | ICD-10-CM | POA: Diagnosis not present

## 2020-12-20 DIAGNOSIS — O99283 Endocrine, nutritional and metabolic diseases complicating pregnancy, third trimester: Secondary | ICD-10-CM | POA: Diagnosis not present

## 2020-12-20 DIAGNOSIS — O99013 Anemia complicating pregnancy, third trimester: Secondary | ICD-10-CM | POA: Diagnosis not present

## 2020-12-20 DIAGNOSIS — O24419 Gestational diabetes mellitus in pregnancy, unspecified control: Secondary | ICD-10-CM | POA: Diagnosis not present

## 2020-12-20 DIAGNOSIS — D509 Iron deficiency anemia, unspecified: Secondary | ICD-10-CM | POA: Diagnosis not present

## 2020-12-20 MED ORDER — ACETAMINOPHEN 325 MG PO TABS
650.0000 mg | ORAL_TABLET | Freq: Once | ORAL | Status: AC
  Administered 2020-12-20: 650 mg via ORAL

## 2020-12-20 MED ORDER — SODIUM CHLORIDE 0.9 % IV SOLN
300.0000 mg | Freq: Once | INTRAVENOUS | Status: AC
  Administered 2020-12-20: 300 mg via INTRAVENOUS
  Filled 2020-12-20: qty 15

## 2020-12-20 MED ORDER — SODIUM CHLORIDE 0.9 % IV SOLN: Freq: Once | INTRAVENOUS | Status: AC

## 2020-12-20 MED ORDER — LORATADINE 10 MG PO TABS
10.0000 mg | ORAL_TABLET | Freq: Once | ORAL | Status: AC
  Administered 2020-12-20: 10 mg via ORAL

## 2020-12-20 MED ORDER — FAMOTIDINE 20 MG PO TABS
20.0000 mg | ORAL_TABLET | Freq: Once | ORAL | Status: AC
  Administered 2020-12-20: 20 mg via ORAL

## 2020-12-20 NOTE — Progress Notes (Signed)
Venofer given today per MD orders. Tolerated infusion without adverse affects. Vital signs stable. No complaints at this time. Discharged from clinic ambulatory in stable condition. Alert and oriented x 3. F/U with [facility] as scheduled.  

## 2020-12-20 NOTE — Patient Instructions (Signed)
[facility] at [facility]  Discharge Instructions:   _______________________________________________________________  Thank you for choosing [facility] at [facility] to provide your oncology and hematology care.  To afford each patient quality time with our providers, please arrive at least 15 minutes before your scheduled appointment.  You need to re-schedule your appointment if you arrive 10 or more minutes late.  We strive to give you quality time with our providers, and arriving late affects you and other patients whose appointments are after yours.  Also, if you no show three or more times for appointments you may be dismissed from the clinic.  Again, thank you for choosing [facility] at [facility]. Our hope is that these requests will allow you access to exceptional care and in a timely manner. _______________________________________________________________  If you have questions after your visit, please contact our office at [phone] between the hours of 8:30 a.m. and 5:00 p.m. Voicemails left after 4:30 p.m. will not be returned until the following business day. _______________________________________________________________  For prescription refill requests, have your pharmacy contact our office. _______________________________________________________________  Recommendations made by the consultant and any test results will be sent to your referring physician. _______________________________________________________________ 

## 2020-12-26 DIAGNOSIS — O09813 Supervision of pregnancy resulting from assisted reproductive technology, third trimester: Secondary | ICD-10-CM | POA: Diagnosis not present

## 2020-12-26 DIAGNOSIS — Z3A29 29 weeks gestation of pregnancy: Secondary | ICD-10-CM | POA: Diagnosis not present

## 2020-12-31 DIAGNOSIS — O24414 Gestational diabetes mellitus in pregnancy, insulin controlled: Secondary | ICD-10-CM | POA: Diagnosis not present

## 2020-12-31 DIAGNOSIS — O09813 Supervision of pregnancy resulting from assisted reproductive technology, third trimester: Secondary | ICD-10-CM | POA: Diagnosis not present

## 2020-12-31 DIAGNOSIS — O99413 Diseases of the circulatory system complicating pregnancy, third trimester: Secondary | ICD-10-CM | POA: Diagnosis not present

## 2020-12-31 DIAGNOSIS — Z3A30 30 weeks gestation of pregnancy: Secondary | ICD-10-CM | POA: Diagnosis not present

## 2020-12-31 DIAGNOSIS — O24419 Gestational diabetes mellitus in pregnancy, unspecified control: Secondary | ICD-10-CM | POA: Diagnosis not present

## 2020-12-31 DIAGNOSIS — G43719 Chronic migraine without aura, intractable, without status migrainosus: Secondary | ICD-10-CM | POA: Diagnosis not present

## 2020-12-31 DIAGNOSIS — O99283 Endocrine, nutritional and metabolic diseases complicating pregnancy, third trimester: Secondary | ICD-10-CM | POA: Diagnosis not present

## 2020-12-31 DIAGNOSIS — G43711 Chronic migraine without aura, intractable, with status migrainosus: Secondary | ICD-10-CM | POA: Diagnosis not present

## 2020-12-31 DIAGNOSIS — I471 Supraventricular tachycardia: Secondary | ICD-10-CM | POA: Diagnosis not present

## 2021-01-01 DIAGNOSIS — Z3A30 30 weeks gestation of pregnancy: Secondary | ICD-10-CM | POA: Diagnosis not present

## 2021-01-01 DIAGNOSIS — I471 Supraventricular tachycardia: Secondary | ICD-10-CM | POA: Diagnosis not present

## 2021-01-01 DIAGNOSIS — O24419 Gestational diabetes mellitus in pregnancy, unspecified control: Secondary | ICD-10-CM | POA: Diagnosis not present

## 2021-01-01 DIAGNOSIS — O24414 Gestational diabetes mellitus in pregnancy, insulin controlled: Secondary | ICD-10-CM | POA: Diagnosis not present

## 2021-01-01 DIAGNOSIS — O99283 Endocrine, nutritional and metabolic diseases complicating pregnancy, third trimester: Secondary | ICD-10-CM | POA: Diagnosis not present

## 2021-01-01 DIAGNOSIS — O09813 Supervision of pregnancy resulting from assisted reproductive technology, third trimester: Secondary | ICD-10-CM | POA: Diagnosis not present

## 2021-01-01 DIAGNOSIS — O99413 Diseases of the circulatory system complicating pregnancy, third trimester: Secondary | ICD-10-CM | POA: Diagnosis not present

## 2021-01-06 ENCOUNTER — Other Ambulatory Visit (HOSPITAL_COMMUNITY): Payer: Self-pay | Admitting: Hematology

## 2021-01-08 DIAGNOSIS — O99413 Diseases of the circulatory system complicating pregnancy, third trimester: Secondary | ICD-10-CM | POA: Diagnosis not present

## 2021-01-08 DIAGNOSIS — I471 Supraventricular tachycardia: Secondary | ICD-10-CM | POA: Diagnosis not present

## 2021-01-08 DIAGNOSIS — O99283 Endocrine, nutritional and metabolic diseases complicating pregnancy, third trimester: Secondary | ICD-10-CM | POA: Diagnosis not present

## 2021-01-08 DIAGNOSIS — O24414 Gestational diabetes mellitus in pregnancy, insulin controlled: Secondary | ICD-10-CM | POA: Diagnosis not present

## 2021-01-08 DIAGNOSIS — Z23 Encounter for immunization: Secondary | ICD-10-CM | POA: Diagnosis not present

## 2021-01-29 DIAGNOSIS — O24419 Gestational diabetes mellitus in pregnancy, unspecified control: Secondary | ICD-10-CM | POA: Diagnosis not present

## 2021-01-29 DIAGNOSIS — O09813 Supervision of pregnancy resulting from assisted reproductive technology, third trimester: Secondary | ICD-10-CM | POA: Diagnosis not present

## 2021-01-29 DIAGNOSIS — I471 Supraventricular tachycardia: Secondary | ICD-10-CM | POA: Diagnosis not present

## 2021-01-29 DIAGNOSIS — O24414 Gestational diabetes mellitus in pregnancy, insulin controlled: Secondary | ICD-10-CM | POA: Diagnosis not present

## 2021-01-29 DIAGNOSIS — O99413 Diseases of the circulatory system complicating pregnancy, third trimester: Secondary | ICD-10-CM | POA: Diagnosis not present

## 2021-01-29 DIAGNOSIS — O99283 Endocrine, nutritional and metabolic diseases complicating pregnancy, third trimester: Secondary | ICD-10-CM | POA: Diagnosis not present

## 2021-02-04 ENCOUNTER — Other Ambulatory Visit: Payer: Self-pay | Admitting: Obstetrics and Gynecology

## 2021-02-05 ENCOUNTER — Inpatient Hospital Stay (HOSPITAL_COMMUNITY): Payer: BC Managed Care – PPO | Attending: Hematology

## 2021-02-05 ENCOUNTER — Other Ambulatory Visit: Payer: Self-pay

## 2021-02-05 DIAGNOSIS — D508 Other iron deficiency anemias: Secondary | ICD-10-CM

## 2021-02-05 DIAGNOSIS — E559 Vitamin D deficiency, unspecified: Secondary | ICD-10-CM

## 2021-02-05 DIAGNOSIS — E538 Deficiency of other specified B group vitamins: Secondary | ICD-10-CM | POA: Insufficient documentation

## 2021-02-05 DIAGNOSIS — E611 Iron deficiency: Secondary | ICD-10-CM | POA: Insufficient documentation

## 2021-02-05 DIAGNOSIS — R7989 Other specified abnormal findings of blood chemistry: Secondary | ICD-10-CM

## 2021-02-05 LAB — CBC WITH DIFFERENTIAL/PLATELET
Abs Immature Granulocytes: 0.15 10*3/uL — ABNORMAL HIGH (ref 0.00–0.07)
Basophils Absolute: 0.1 10*3/uL (ref 0.0–0.1)
Basophils Relative: 1 %
Eosinophils Absolute: 0.2 10*3/uL (ref 0.0–0.5)
Eosinophils Relative: 2 %
HCT: 37 % (ref 36.0–46.0)
Hemoglobin: 12 g/dL (ref 12.0–15.0)
Immature Granulocytes: 1 %
Lymphocytes Relative: 14 %
Lymphs Abs: 1.6 10*3/uL (ref 0.7–4.0)
MCH: 30.5 pg (ref 26.0–34.0)
MCHC: 32.4 g/dL (ref 30.0–36.0)
MCV: 94.1 fL (ref 80.0–100.0)
Monocytes Absolute: 0.6 10*3/uL (ref 0.1–1.0)
Monocytes Relative: 5 %
Neutro Abs: 8.9 10*3/uL — ABNORMAL HIGH (ref 1.7–7.7)
Neutrophils Relative %: 77 %
Platelets: 189 10*3/uL (ref 150–400)
RBC: 3.93 MIL/uL (ref 3.87–5.11)
RDW: 14.2 % (ref 11.5–15.5)
WBC: 11.5 10*3/uL — ABNORMAL HIGH (ref 4.0–10.5)
nRBC: 0 % (ref 0.0–0.2)

## 2021-02-05 LAB — IRON AND TIBC
Iron: 112 ug/dL (ref 28–170)
Saturation Ratios: 27 % (ref 10.4–31.8)
TIBC: 418 ug/dL (ref 250–450)
UIBC: 306 ug/dL

## 2021-02-05 LAB — FERRITIN: Ferritin: 153 ng/mL (ref 11–307)

## 2021-02-05 LAB — VITAMIN B12: Vitamin B-12: 128 pg/mL — ABNORMAL LOW (ref 180–914)

## 2021-02-05 LAB — VITAMIN D 25 HYDROXY (VIT D DEFICIENCY, FRACTURES): Vit D, 25-Hydroxy: 32.03 ng/mL (ref 30–100)

## 2021-02-06 ENCOUNTER — Telehealth (HOSPITAL_COMMUNITY): Payer: BC Managed Care – PPO | Admitting: Hematology

## 2021-02-06 DIAGNOSIS — Z3685 Encounter for antenatal screening for Streptococcus B: Secondary | ICD-10-CM | POA: Diagnosis not present

## 2021-02-11 ENCOUNTER — Inpatient Hospital Stay (HOSPITAL_BASED_OUTPATIENT_CLINIC_OR_DEPARTMENT_OTHER): Payer: BC Managed Care – PPO | Admitting: Hematology

## 2021-02-11 ENCOUNTER — Other Ambulatory Visit: Payer: Self-pay

## 2021-02-11 ENCOUNTER — Encounter (HOSPITAL_COMMUNITY): Payer: Self-pay | Admitting: Hematology

## 2021-02-11 DIAGNOSIS — D508 Other iron deficiency anemias: Secondary | ICD-10-CM

## 2021-02-11 DIAGNOSIS — E538 Deficiency of other specified B group vitamins: Secondary | ICD-10-CM | POA: Diagnosis not present

## 2021-02-11 MED ORDER — CYANOCOBALAMIN 1000 MCG/ML IJ KIT: 1000.0000 ug | PACK | INTRAMUSCULAR | 6 refills | Status: DC

## 2021-02-11 NOTE — Progress Notes (Signed)
Virtual Visit via Telephone Note  I connected with Alejandra Barr on 02/11/21 at  4:15 PM EDT by telephone and verified that I am speaking with the correct person using two identifiers.  Location: Patient: At home Provider: In the office   I discussed the limitations, risks, security and privacy concerns of performing an evaluation and management service by telephone and the availability of in person appointments. I also discussed with the patient that there may be a patient responsible charge related to this service. The patient expressed understanding and agreed to proceed.   History of Present Illness: She is seen in our clinic for iron, B12 and vitamin D deficiencies.  She receives intermittent iron infusions.   Observations/Objective: She denies any bleeding per rectum or melena.  She started having vaginal discharge today but nonbloody.  She felt her energy levels have improved after last infusion of Venofer.  Progesterone was apparently discontinued on 02/07/2021-she will have repeat C-section around 03/01/2021.  Assessment and Plan:  1.  Iron deficiency state: -Venofer on 12/13/2020 on 12/20/2020. -Reviewed labs from 02/05/2021.  Ferritin improved to 153.  Percent saturation 27.  Hemoglobin 12. -Continue prenatal vitamin twice daily.  Continue liquid iron once daily. -No indication for parenteral iron therapy at this time. -RTC 3 months with repeat labs.  2.  Vitamin B12 deficiency: -She is taking vitamin B12 injection once weekly. -Vitamin B12 level is low at 128.  Will increase vitamin B12 injection to twice weekly.  3.  Vitamin D deficiency: -Continue vitamin D once weekly.  Vitamin D level is 32.  4.  Folic acid deficiency: -Continue oral supplementation with folic acid 1 mg daily.  Will check level at next time.   Follow Up Instructions: RTC 3 months with labs.   I discussed the assessment and treatment plan with the patient. The patient was provided an opportunity to  ask questions and all were answered. The patient agreed with the plan and demonstrated an understanding of the instructions.   The patient was advised to call back or seek an in-person evaluation if the symptoms worsen or if the condition fails to improve as anticipated.  I provided 12 minutes of non-face-to-face time during this encounter.   Derek Jack, MD

## 2021-02-13 ENCOUNTER — Other Ambulatory Visit: Payer: Self-pay

## 2021-02-13 ENCOUNTER — Inpatient Hospital Stay (HOSPITAL_COMMUNITY)
Admission: AD | Admit: 2021-02-13 | Discharge: 2021-02-13 | Disposition: A | Discharge status: Home / Self Care | Payer: BC Managed Care – PPO | Attending: Obstetrics and Gynecology | Admitting: Obstetrics and Gynecology

## 2021-02-13 ENCOUNTER — Encounter (HOSPITAL_COMMUNITY): Payer: Self-pay | Admitting: *Deleted

## 2021-02-13 ENCOUNTER — Inpatient Hospital Stay (HOSPITAL_BASED_OUTPATIENT_CLINIC_OR_DEPARTMENT_OTHER): Payer: BC Managed Care – PPO

## 2021-02-13 DIAGNOSIS — Z881 Allergy status to other antibiotic agents status: Secondary | ICD-10-CM | POA: Diagnosis not present

## 2021-02-13 DIAGNOSIS — Z7984 Long term (current) use of oral hypoglycemic drugs: Secondary | ICD-10-CM | POA: Diagnosis not present

## 2021-02-13 DIAGNOSIS — O283 Abnormal ultrasonic finding on antenatal screening of mother: Secondary | ICD-10-CM

## 2021-02-13 DIAGNOSIS — O24414 Gestational diabetes mellitus in pregnancy, insulin controlled: Secondary | ICD-10-CM

## 2021-02-13 DIAGNOSIS — Z87891 Personal history of nicotine dependence: Secondary | ICD-10-CM | POA: Insufficient documentation

## 2021-02-13 DIAGNOSIS — O24113 Pre-existing diabetes mellitus, type 2, in pregnancy, third trimester: Secondary | ICD-10-CM | POA: Diagnosis not present

## 2021-02-13 DIAGNOSIS — Z3A36 36 weeks gestation of pregnancy: Secondary | ICD-10-CM

## 2021-02-13 DIAGNOSIS — Z885 Allergy status to narcotic agent status: Secondary | ICD-10-CM | POA: Diagnosis not present

## 2021-02-13 DIAGNOSIS — E119 Type 2 diabetes mellitus without complications: Secondary | ICD-10-CM | POA: Diagnosis not present

## 2021-02-13 DIAGNOSIS — O10013 Pre-existing essential hypertension complicating pregnancy, third trimester: Secondary | ICD-10-CM

## 2021-02-13 DIAGNOSIS — Z3A37 37 weeks gestation of pregnancy: Secondary | ICD-10-CM | POA: Diagnosis not present

## 2021-02-13 DIAGNOSIS — Z880 Allergy status to penicillin: Secondary | ICD-10-CM | POA: Diagnosis not present

## 2021-02-13 DIAGNOSIS — O24419 Gestational diabetes mellitus in pregnancy, unspecified control: Secondary | ICD-10-CM | POA: Diagnosis not present

## 2021-02-13 DIAGNOSIS — O99213 Obesity complicating pregnancy, third trimester: Secondary | ICD-10-CM

## 2021-02-13 DIAGNOSIS — O289 Unspecified abnormal findings on antenatal screening of mother: Secondary | ICD-10-CM

## 2021-02-13 DIAGNOSIS — E669 Obesity, unspecified: Secondary | ICD-10-CM | POA: Diagnosis not present

## 2021-02-13 NOTE — MAU Provider Note (Signed)
History     [id number]  Arrival date and time: 02/13/21 1648    Chief Complaint  Patient presents with  . Fetal Monitoring     HPI Alejandra Barr is a 35 y.o. at 44w6dwith PMHx notable for cHTN, DM2, who presents for repeat BPP.   Sign out given by Dr. TRonita Hipps patient is high risk pregnancy that has been monitored weekly with NST[facility] Today in office had BPP 4/8 (for breathing and tone). Sent to MAU for NST and repeat BPP.  Patient reports she feels fine, just a little nervous about repeat testing. Has good fetal movement, no vaginal bleeding or loss of fluid, no contractions.     OB History    Gravida  3   Para  2   Term  1   Preterm  1   AB      Living  3     SAB      IAB      Ectopic      Multiple  1   Live Births  3           Past Medical History:  Diagnosis Date  . Asthma   . Atrial flutter ([place]    Status post RFA by Dr. TLovena Lein 2004  . Chronic anxiety   . Chronic depression   . 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 Iron deficiency   . Iron deficiency anemia 10/02/2010   Qualifier: Diagnosis of  By: SMoshe CiproMD, MJoycelyn Schmid   . Low vitamin B12 level 07/10/2013   Overview:  Last Assessment & Plan:  Being treated through hematology  . Newborn product of in vitro fertilization (IVF) pregnancy   . Obesity   . PONV (postoperative nausea and vomiting)     Past Surgical History:  Procedure Laterality Date  . Bilateral eustachian tube placement on 4 different occasions    . CARDIAC ELECTROPHYSIOLOGY MAPPING AND ABLATION    . CESAREAN SECTION N/A 05/26/2017   Procedure: CESAREAN SECTION;  Surgeon: MLinda Hedges DO;  [place]  Service: Obstetrics;  Laterality: N/A;  . CESAREAN SECTION MULTI-GESTATIONAL N/A 02/10/2019   Procedure: Repeat CESAREAN SECTION MULTI-GESTATIONAL;  Surgeon: TBrien Few MD;  [place] [facility];  Service: Obstetrics;  Laterality: N/A;  EDD: 03/15/19 Allergy: Penicillin, Morphine,  Cipro  . CHOLECYSTECTOMY  2003  . COLONOSCOPY N/A 11/29/2013   Procedure: COLONOSCOPY;  Surgeon: NRogene Houston MD;  [facility];  Service: Endoscopy;  Laterality: N/A;  120-rescheduled to 300 Ann notified pt  . GASTRIC BYPASS  2006  . TONSILLECTOMY  1990    Family History  Problem Relation Age of Onset  . Fibromyalgia Mother        Chronic pelvic pain   . Hypothyroidism Mother   . Obesity Mother   . Heart defect Mother        Supraventricular, tachycardia /tachypalpation   . Arrhythmia Mother   . Breast cancer Mother 466 . Hypertension Maternal Grandmother   . Diabetes Maternal Grandmother   . Hypertension Maternal Grandfather   . Heart disease Paternal Grandmother   . Hypertension Paternal Grandmother   . Heart disease Paternal Grandfather   . Hypertension Paternal Grandfather     Social History   Socioeconomic History  . Marital status: Married    Spouse name: Not on file  . Number of children: Not on file  . Years of education: Not on file  . Highest  education level: Not on file  Occupational History  . Occupation: Nurse     Employer: [employer]  Tobacco Use  . Smoking status: Former Smoker    Packs/day: 0.50    Types: Cigarettes    Start date: 03/21/2013    Quit date: 06/02/2015    Years since quitting: 5.7  . Smokeless tobacco: Never Used  Vaping Use  . Vaping Use: Never used  Substance and Sexual Activity  . Alcohol use: No    Alcohol/week: 0.0 standard drinks    Comment: sober alcoholic 6 years  . Drug use: No  . Sexual activity: Not Currently    Birth control/protection: None    Comment: husband had vasectomy  Other Topics Concern  . Not on file  Social History Narrative  . Not on file   Social Determinants of Health   Financial Resource Strain: Low Risk   . Difficulty of Paying Living Expenses: Not hard at all  Food Insecurity: No Food Insecurity  . Worried About Charity fundraiser in the Last Year: Never true  . Ran Out of Food in  the Last Year: Never true  Transportation Needs: No Transportation Needs  . Lack of Transportation (Medical): No  . Lack of Transportation (Non-Medical): No  Physical Activity: Inactive  . Days of Exercise per Week: 0 days  . Minutes of Exercise per Session: 0 min  Stress: No Stress Concern Present  . Feeling of Stress : Not at all  Social Connections: Moderately Integrated  . Frequency of Communication with Friends and Family: More than three times a week  . Frequency of Social Gatherings with Friends and Family: More than three times a week  . Attends Religious Services: 1 to 4 times per year  . Active Member of Clubs or Organizations: No  . Attends Archivist Meetings: Never  . Marital Status: Married  Human resources officer Violence: Not At Risk  . Fear of Current or Ex-Partner: No  . Emotionally Abused: No  . Physically Abused: No  . Sexually Abused: No    Allergies  Allergen Reactions  . Ciprofloxacin Anaphylaxis  . Morphine And Related Anaphylaxis  . Penicillins Anaphylaxis    Has patient had a PCN reaction causing immediate rash, facial/tongue/throat swelling, SOB or lightheadedness with hypotension: No Has patient had a PCN reaction causing severe rash involving mucus membranes or skin necrosis: No Has patient had a PCN reaction that required hospitalization: No Has patient had a PCN reaction occurring within the last 10 years: No If all of the above answers are "NO", then may proceed with Cephalosporin use.    No current facility-administered medications on file prior to encounter.   Current Outpatient Medications on File Prior to Encounter  Medication Sig Dispense Refill  . cyanocobalamin (,VITAMIN B-12,) 1000 MCG/ML injection INJECT 1ML INTO THE MUSCLE EVERY 7 DAYS 1 mL 6  . Cyanocobalamin 1000 MCG/ML KIT Inject 1,000 mcg into the muscle every 3 (three) days. 1 kit 6  . ferrous sulfate 300 (60 Fe) MG/5ML syrup Take 5 mLs (300 mg total) by mouth 2 (two) times  daily with a meal. Mix in 4oz of orange juice 628 mL 3  . folic acid (FOLVITE) 366 MCG tablet Take 1 tablet (800 mcg total) by mouth daily. 90 tablet 3  . levothyroxine (SYNTHROID) 75 MCG tablet Take 75 mcg by mouth daily.    . metFORMIN (GLUCOPHAGE) 500 MG tablet Take 500 mg by mouth 2 (two) times daily.    Marland Kitchen  metoprolol succinate (TOPROL-XL) 25 MG 24 hr tablet Take 2 tablets (50 mg total) by mouth daily. 180 tablet 2  . Prenatal Vit-Fe Fumarate-FA (PRENATAL PO) Take 1 tablet by mouth daily.    . Vitamin D, Ergocalciferol, (DRISDOL) 1.25 MG (50000 UNIT) CAPS capsule Take 1 capsule (50,000 Units total) by mouth once a week. 5 capsule 4  . butalbital-acetaminophen-caffeine (FIORICET) 50-325-40 MG tablet Take 1 tablet by mouth 2 (two) times daily as needed.    . senna-docusate (SENOKOT-S) 8.6-50 MG tablet Take 2 tablets by mouth daily.       ROS Pertinent positives and negative per HPI, all others reviewed and negative  Physical Exam   BP 135/78 (BP Location: Right Arm)   Pulse 92   Temp 98.7 F (37.1 C) (Oral)   Resp 18   Ht 5' 1" (1.549 m)   Wt 117.2 kg   LMP 04/01/2020   SpO2 100%   BMI 48.81 kg/m   Patient Vitals for the past 24 hrs:  BP Temp Temp src Pulse Resp SpO2 Height Weight  02/13/21 1732 135/78 98.7 F (37.1 C) Oral 92 18 100 % -- --  02/13/21 1727 -- -- -- -- -- -- 5' 1" (1.549 m) 117.2 kg    Physical Exam Vitals reviewed.  Constitutional:      General: She is not in acute distress.    Appearance: She is well-developed. She is not diaphoretic.  Eyes:     General: No scleral icterus. Pulmonary:     Effort: Pulmonary effort is normal. No respiratory distress.  Abdominal:     General: There is no distension.     Palpations: Abdomen is soft.     Tenderness: There is no abdominal tenderness. There is no guarding or rebound.  Skin:    General: Skin is warm and dry.  Neurological:     Mental Status: She is alert.     Coordination: Coordination normal.       Cervical Exam    Bedside Ultrasound Not done  My interpretation: n/a  FHT Baseline 145, moderate variability, +accels, no decels Toco: quiet Cat: I  Labs No results found for this or any previous visit (from the past 24 hour(s)).  Imaging No results found.  MAU Course  Procedures Lab Orders  No laboratory test(s) ordered today   No orders of the defined types were placed in this encounter.   Imaging Orders     [place] MFM Fetal BPP Wo Non Stress  MDM mild  Assessment and Plan  #Abnormal fetal testing Reassuringly BPP here is 8/8 and NST is reactive. Patient reassured.  #FWB FHT Cat I NST: Reactive  Discharged to home in stable condition.   Clarnce Flock, MD/MPH 02/13/21 7:00 PM  Allergies as of 02/13/2021      Reactions   Ciprofloxacin Anaphylaxis   Morphine And Related Anaphylaxis   Penicillins Anaphylaxis   Has patient had a PCN reaction causing immediate rash, facial/tongue/throat swelling, SOB or lightheadedness with hypotension: No Has patient had a PCN reaction causing severe rash involving mucus membranes or skin necrosis: No Has patient had a PCN reaction that required hospitalization: No Has patient had a PCN reaction occurring within the last 10 years: No If all of the above answers are "NO", then may proceed with Cephalosporin use.      Medication List    STOP taking these medications   butalbital-acetaminophen-caffeine 50-325-40 MG tablet Commonly known as: FIORICET   senna-docusate 8.6-50 MG tablet Commonly known  as: Senokot-S     TAKE these medications   cyanocobalamin 1000 MCG/ML injection Commonly known as: (VITAMIN B-12) INJECT 1ML INTO THE MUSCLE EVERY 7 DAYS   Cyanocobalamin 1000 MCG/ML Kit Inject 1,000 mcg into the muscle every 3 (three) days.   ferrous sulfate 300 (60 Fe) MG/5ML syrup Take 5 mLs (300 mg total) by mouth 2 (two) times daily with a meal. Mix in 4oz of orange juice   folic acid 428 MCG tablet Commonly  known as: FOLVITE Take 1 tablet (800 mcg total) by mouth daily.   levothyroxine 75 MCG tablet Commonly known as: SYNTHROID Take 75 mcg by mouth daily.   metFORMIN 500 MG tablet Commonly known as: GLUCOPHAGE Take 500 mg by mouth 2 (two) times daily.   metoprolol succinate 25 MG 24 hr tablet Commonly known as: TOPROL-XL Take 2 tablets (50 mg total) by mouth daily.   PRENATAL PO Take 1 tablet by mouth daily.   Vitamin D (Ergocalciferol) 1.25 MG (50000 UNIT) Caps capsule Commonly known as: DRISDOL Take 1 capsule (50,000 Units total) by mouth once a week.

## 2021-02-13 NOTE — MAU Note (Signed)
Sent from MD office for EFM secondary had BPP 4/8 at office.  Denies VB or LOF.  Endorses +FM.

## 2021-02-18 ENCOUNTER — Telehealth (HOSPITAL_COMMUNITY): Payer: Self-pay | Admitting: *Deleted

## 2021-02-18 NOTE — Telephone Encounter (Signed)
Preadmission screen  

## 2021-02-18 NOTE — Patient Instructions (Signed)
Alejandra Barr  02/18/2021   Your procedure is scheduled on:  03/01/2021  Arrive at 0930 at Entrance C on [address] at [facility]  and [address]. You are invited to use the FREE valet parking or use the Visitor's parking deck.  Pick up the phone at the desk and dial [phone].  Call this number if you have problems the morning of surgery: [phone]  Remember:   Do not eat food:(After Midnight) Desps de medianoche.  Do not drink clear liquids: (After Midnight) Desps de medianoche.  Take these medicines the morning of surgery with A SIP OF WATER:  Take toprol XL and levothyroxine as prescribed   Do not wear jewelry, make-up or nail polish.  Do not wear lotions, powders, or perfumes. Do not wear deodorant.  Do not shave 48 hours prior to surgery.  Do not bring valuables to the hospital.  [facility] is not   responsible for any belongings or valuables brought to the hospital.  Contacts, dentures or bridgework may not be worn into surgery.  Leave suitcase in the car. After surgery it may be brought to your room.  For patients admitted to the hospital, checkout time is 11:00 AM the day of              discharge.      Please read over the following fact sheets that you were given:     Preparing for Surgery

## 2021-02-19 ENCOUNTER — Telehealth (HOSPITAL_COMMUNITY): Payer: Self-pay | Admitting: *Deleted

## 2021-02-19 NOTE — Telephone Encounter (Signed)
Preadmission screen  

## 2021-02-20 ENCOUNTER — Encounter (HOSPITAL_COMMUNITY): Payer: Self-pay

## 2021-02-21 DIAGNOSIS — O24419 Gestational diabetes mellitus in pregnancy, unspecified control: Secondary | ICD-10-CM | POA: Diagnosis not present

## 2021-02-25 DIAGNOSIS — O99413 Diseases of the circulatory system complicating pregnancy, third trimester: Secondary | ICD-10-CM | POA: Diagnosis not present

## 2021-02-25 DIAGNOSIS — Z3A38 38 weeks gestation of pregnancy: Secondary | ICD-10-CM | POA: Diagnosis not present

## 2021-02-25 DIAGNOSIS — I471 Supraventricular tachycardia: Secondary | ICD-10-CM | POA: Diagnosis not present

## 2021-02-25 DIAGNOSIS — O99283 Endocrine, nutritional and metabolic diseases complicating pregnancy, third trimester: Secondary | ICD-10-CM | POA: Diagnosis not present

## 2021-02-25 DIAGNOSIS — O24414 Gestational diabetes mellitus in pregnancy, insulin controlled: Secondary | ICD-10-CM | POA: Diagnosis not present

## 2021-02-25 DIAGNOSIS — O24419 Gestational diabetes mellitus in pregnancy, unspecified control: Secondary | ICD-10-CM | POA: Diagnosis not present

## 2021-02-25 NOTE — Anesthesia Preprocedure Evaluation (Addendum)
Anesthesia Evaluation  Patient identified by MRN, date of birth, ID band Patient awake    Reviewed: Allergy & Precautions, NPO status , Patient's Chart, lab work & pertinent test results  History of Anesthesia Complications (+) PONV and history of anesthetic complications  Airway Mallampati: II  TM Distance: >3 FB Neck ROM: Full    Dental  (+) Lower Dentures, Upper Dentures   Pulmonary asthma , former smoker,    Pulmonary exam normal        Cardiovascular Normal cardiovascular exam+ dysrhythmias (s/p ablation 2004) Atrial Fibrillation      Neuro/Psych  Headaches, Anxiety Depression    GI/Hepatic negative GI ROS, Neg liver ROS,   Endo/Other  diabetes, Type 2, Oral Hypoglycemic AgentsHypothyroidism Morbid obesity (BMI 49)  Renal/GU negative Renal ROS  negative genitourinary   Musculoskeletal negative musculoskeletal ROS (+)   Abdominal   Peds  Hematology negative hematology ROS (+)   Anesthesia Other Findings Day of surgery medications reviewed with patient.  Reproductive/Obstetrics (+) Pregnancy (Hx of C/S x2)                            Anesthesia Physical Anesthesia Plan  ASA: III  Anesthesia Plan: Spinal   Post-op Pain Management:    Induction:   PONV Risk Score and Plan: 4 or greater and Treatment may vary due to age or medical condition, Ondansetron and Dexamethasone  Airway Management Planned: Natural Airway  Additional Equipment: None  Intra-op Plan:   Post-operative Plan:   Informed Consent: I have reviewed the patients History and Physical, chart, labs and discussed the procedure including the risks, benefits and alternatives for the proposed anesthesia with the patient or authorized representative who has indicated his/her understanding and acceptance.       Plan Discussed with: CRNA  Anesthesia Plan Comments: (Discussed allergy to morphine and plan for  postoperative pain control given inability to give duramorph. Patient states that she has had suboptimal pain control following previous C/S's (no long-acting neuraxial opioids were given). Discussed plan for neuraxial hydromorphone and patient was in agreement. CSE due to body habitus and 3rd C/S. Daiva Huge, MD)       Anesthesia Quick Evaluation

## 2021-02-27 ENCOUNTER — Other Ambulatory Visit (HOSPITAL_COMMUNITY)
Admission: RE | Admit: 2021-02-27 | Discharge: 2021-02-27 | Disposition: A | Discharge status: Home / Self Care | Payer: BC Managed Care – PPO | Source: Ambulatory Visit | Attending: Obstetrics and Gynecology | Admitting: Obstetrics and Gynecology

## 2021-02-27 ENCOUNTER — Other Ambulatory Visit: Payer: Self-pay

## 2021-02-27 ENCOUNTER — Encounter (HOSPITAL_COMMUNITY)
Admission: RE | Admit: 2021-02-27 | Discharge: 2021-02-27 | Disposition: A | Discharge status: Home / Self Care | Payer: BC Managed Care – PPO | Source: Ambulatory Visit | Attending: Obstetrics and Gynecology | Admitting: Obstetrics and Gynecology

## 2021-02-27 DIAGNOSIS — O9942 Diseases of the circulatory system complicating childbirth: Secondary | ICD-10-CM | POA: Diagnosis not present

## 2021-02-27 DIAGNOSIS — Z3A39 39 weeks gestation of pregnancy: Secondary | ICD-10-CM | POA: Diagnosis not present

## 2021-02-27 DIAGNOSIS — Z20822 Contact with and (suspected) exposure to covid-19: Secondary | ICD-10-CM | POA: Diagnosis not present

## 2021-02-27 DIAGNOSIS — O34219 Maternal care for unspecified type scar from previous cesarean delivery: Secondary | ICD-10-CM | POA: Diagnosis not present

## 2021-02-27 DIAGNOSIS — Z23 Encounter for immunization: Secondary | ICD-10-CM | POA: Diagnosis not present

## 2021-02-27 DIAGNOSIS — O99284 Endocrine, nutritional and metabolic diseases complicating childbirth: Secondary | ICD-10-CM | POA: Diagnosis not present

## 2021-02-27 DIAGNOSIS — O24425 Gestational diabetes mellitus in childbirth, controlled by oral hypoglycemic drugs: Secondary | ICD-10-CM | POA: Diagnosis not present

## 2021-02-27 DIAGNOSIS — Z87891 Personal history of nicotine dependence: Secondary | ICD-10-CM | POA: Diagnosis not present

## 2021-02-27 DIAGNOSIS — O34211 Maternal care for low transverse scar from previous cesarean delivery: Secondary | ICD-10-CM | POA: Diagnosis not present

## 2021-02-27 DIAGNOSIS — O99214 Obesity complicating childbirth: Secondary | ICD-10-CM | POA: Diagnosis not present

## 2021-02-27 DIAGNOSIS — D509 Iron deficiency anemia, unspecified: Secondary | ICD-10-CM | POA: Diagnosis not present

## 2021-02-27 DIAGNOSIS — O9952 Diseases of the respiratory system complicating childbirth: Secondary | ICD-10-CM | POA: Diagnosis not present

## 2021-02-27 DIAGNOSIS — Z01812 Encounter for preprocedural laboratory examination: Secondary | ICD-10-CM | POA: Insufficient documentation

## 2021-02-27 DIAGNOSIS — J45909 Unspecified asthma, uncomplicated: Secondary | ICD-10-CM | POA: Diagnosis not present

## 2021-02-27 DIAGNOSIS — Z880 Allergy status to penicillin: Secondary | ICD-10-CM | POA: Diagnosis not present

## 2021-02-27 DIAGNOSIS — E039 Hypothyroidism, unspecified: Secondary | ICD-10-CM | POA: Diagnosis not present

## 2021-02-27 DIAGNOSIS — O9902 Anemia complicating childbirth: Secondary | ICD-10-CM | POA: Diagnosis not present

## 2021-02-27 DIAGNOSIS — I4892 Unspecified atrial flutter: Secondary | ICD-10-CM | POA: Diagnosis not present

## 2021-02-27 DIAGNOSIS — O24424 Gestational diabetes mellitus in childbirth, insulin controlled: Secondary | ICD-10-CM | POA: Diagnosis not present

## 2021-02-27 DIAGNOSIS — Z3A38 38 weeks gestation of pregnancy: Secondary | ICD-10-CM | POA: Diagnosis not present

## 2021-02-27 DIAGNOSIS — O24419 Gestational diabetes mellitus in pregnancy, unspecified control: Secondary | ICD-10-CM | POA: Diagnosis not present

## 2021-02-27 HISTORY — DX: Gestational diabetes mellitus in pregnancy, unspecified control: O24.419

## 2021-02-27 HISTORY — DX: Encounter for other specified aftercare: Z51.89

## 2021-02-27 HISTORY — DX: Supervision of pregnancy with other poor reproductive or obstetric history, unspecified trimester: O09.299

## 2021-02-27 LAB — CBC
HCT: 38.4 % (ref 36.0–46.0)
Hemoglobin: 12.4 g/dL (ref 12.0–15.0)
MCH: 30.2 pg (ref 26.0–34.0)
MCHC: 32.3 g/dL (ref 30.0–36.0)
MCV: 93.4 fL (ref 80.0–100.0)
Platelets: 201 10*3/uL (ref 150–400)
RBC: 4.11 MIL/uL (ref 3.87–5.11)
RDW: 14.1 % (ref 11.5–15.5)
WBC: 11 10*3/uL — ABNORMAL HIGH (ref 4.0–10.5)
nRBC: 0 % (ref 0.0–0.2)

## 2021-02-27 LAB — TYPE AND SCREEN
ABO/RH(D): O POS
Antibody Screen: NEGATIVE

## 2021-02-27 LAB — SARS CORONAVIRUS 2 (TAT 6-24 HRS): SARS Coronavirus 2: NEGATIVE

## 2021-02-28 LAB — RPR: RPR Ser Ql: NONREACTIVE

## 2021-03-01 ENCOUNTER — Encounter (HOSPITAL_COMMUNITY)
Admission: RE | Disposition: A | Discharge status: Home / Self Care | Payer: Self-pay | Source: Home / Self Care | Attending: Obstetrics and Gynecology

## 2021-03-01 ENCOUNTER — Other Ambulatory Visit: Payer: Self-pay

## 2021-03-01 ENCOUNTER — Inpatient Hospital Stay (HOSPITAL_COMMUNITY): Payer: BC Managed Care – PPO | Admitting: Anesthesiology

## 2021-03-01 ENCOUNTER — Encounter (HOSPITAL_COMMUNITY): Payer: Self-pay | Admitting: Obstetrics and Gynecology

## 2021-03-01 ENCOUNTER — Inpatient Hospital Stay (HOSPITAL_COMMUNITY)
Admission: RE | Admit: 2021-03-01 | Discharge: 2021-03-04 | DRG: 786 | Disposition: A | Discharge status: Home / Self Care | Payer: BC Managed Care – PPO | Attending: Obstetrics and Gynecology | Admitting: Obstetrics and Gynecology

## 2021-03-01 DIAGNOSIS — O9952 Diseases of the respiratory system complicating childbirth: Secondary | ICD-10-CM | POA: Diagnosis present

## 2021-03-01 DIAGNOSIS — O9902 Anemia complicating childbirth: Secondary | ICD-10-CM | POA: Diagnosis present

## 2021-03-01 DIAGNOSIS — I4892 Unspecified atrial flutter: Secondary | ICD-10-CM | POA: Diagnosis present

## 2021-03-01 DIAGNOSIS — Z880 Allergy status to penicillin: Secondary | ICD-10-CM | POA: Diagnosis not present

## 2021-03-01 DIAGNOSIS — O99284 Endocrine, nutritional and metabolic diseases complicating childbirth: Secondary | ICD-10-CM | POA: Diagnosis present

## 2021-03-01 DIAGNOSIS — Z87891 Personal history of nicotine dependence: Secondary | ICD-10-CM | POA: Diagnosis not present

## 2021-03-01 DIAGNOSIS — O24424 Gestational diabetes mellitus in childbirth, insulin controlled: Secondary | ICD-10-CM | POA: Diagnosis present

## 2021-03-01 DIAGNOSIS — D509 Iron deficiency anemia, unspecified: Secondary | ICD-10-CM | POA: Diagnosis present

## 2021-03-01 DIAGNOSIS — O34211 Maternal care for low transverse scar from previous cesarean delivery: Secondary | ICD-10-CM | POA: Diagnosis present

## 2021-03-01 DIAGNOSIS — O9942 Diseases of the circulatory system complicating childbirth: Secondary | ICD-10-CM | POA: Diagnosis present

## 2021-03-01 DIAGNOSIS — O34219 Maternal care for unspecified type scar from previous cesarean delivery: Secondary | ICD-10-CM | POA: Diagnosis present

## 2021-03-01 DIAGNOSIS — E039 Hypothyroidism, unspecified: Secondary | ICD-10-CM | POA: Diagnosis present

## 2021-03-01 DIAGNOSIS — Z3A39 39 weeks gestation of pregnancy: Secondary | ICD-10-CM | POA: Diagnosis not present

## 2021-03-01 DIAGNOSIS — J45909 Unspecified asthma, uncomplicated: Secondary | ICD-10-CM | POA: Diagnosis present

## 2021-03-01 DIAGNOSIS — Z98891 History of uterine scar from previous surgery: Secondary | ICD-10-CM

## 2021-03-01 DIAGNOSIS — O24419 Gestational diabetes mellitus in pregnancy, unspecified control: Secondary | ICD-10-CM

## 2021-03-01 DIAGNOSIS — Z20822 Contact with and (suspected) exposure to covid-19: Secondary | ICD-10-CM | POA: Diagnosis present

## 2021-03-01 DIAGNOSIS — Z8759 Personal history of other complications of pregnancy, childbirth and the puerperium: Secondary | ICD-10-CM

## 2021-03-01 DIAGNOSIS — O99214 Obesity complicating childbirth: Secondary | ICD-10-CM | POA: Diagnosis present

## 2021-03-01 DIAGNOSIS — D649 Anemia, unspecified: Secondary | ICD-10-CM

## 2021-03-01 LAB — GLUCOSE, CAPILLARY
Glucose-Capillary: 71 mg/dL (ref 70–99)
Glucose-Capillary: 80 mg/dL (ref 70–99)

## 2021-03-01 SURGERY — Surgical Case
Anesthesia: Spinal

## 2021-03-01 MED ORDER — OXYTOCIN-SODIUM CHLORIDE 30-0.9 UT/500ML-% IV SOLN
INTRAVENOUS | Status: DC | PRN
  Administered 2021-03-01: 400 mL via INTRAVENOUS

## 2021-03-01 MED ORDER — ACETAMINOPHEN 160 MG/5ML PO SOLN: 1000.0000 mg | Freq: Once | ORAL | Status: DC

## 2021-03-01 MED ORDER — ACETAMINOPHEN 500 MG PO TABS
1000.0000 mg | ORAL_TABLET | Freq: Four times a day (QID) | ORAL | Status: AC
  Administered 2021-03-01 – 2021-03-02 (×3): 1000 mg via ORAL
  Filled 2021-03-01 (×3): qty 2

## 2021-03-01 MED ORDER — DIPHENHYDRAMINE HCL 25 MG PO CAPS: 25.0000 mg | ORAL_CAPSULE | Freq: Four times a day (QID) | ORAL | Status: DC | PRN

## 2021-03-01 MED ORDER — BUPIVACAINE HCL (PF) 0.25 % IJ SOLN
INTRAMUSCULAR | Status: AC
  Filled 2021-03-01: qty 10

## 2021-03-01 MED ORDER — SENNOSIDES-DOCUSATE SODIUM 8.6-50 MG PO TABS
2.0000 | ORAL_TABLET | Freq: Every day | ORAL | Status: DC
  Administered 2021-03-02 – 2021-03-04 (×3): 2 via ORAL
  Filled 2021-03-01 (×3): qty 2

## 2021-03-01 MED ORDER — KETOROLAC TROMETHAMINE 30 MG/ML IJ SOLN: 30.0000 mg | Freq: Once | INTRAMUSCULAR | Status: DC

## 2021-03-01 MED ORDER — DIPHENHYDRAMINE HCL 50 MG/ML IJ SOLN: 12.5000 mg | Freq: Four times a day (QID) | INTRAMUSCULAR | Status: DC | PRN

## 2021-03-01 MED ORDER — LACTATED RINGERS IV SOLN: INTRAVENOUS | Status: DC

## 2021-03-01 MED ORDER — COCONUT OIL OIL: 1.0000 "application " | TOPICAL_OIL | Status: DC | PRN

## 2021-03-01 MED ORDER — FENTANYL CITRATE (PF) 100 MCG/2ML IJ SOLN
INTRAMUSCULAR | Status: AC
  Filled 2021-03-01: qty 2

## 2021-03-01 MED ORDER — ZOLPIDEM TARTRATE 5 MG PO TABS: 5.0000 mg | ORAL_TABLET | Freq: Every evening | ORAL | Status: DC | PRN

## 2021-03-01 MED ORDER — NALBUPHINE HCL 10 MG/ML IJ SOLN: 5.0000 mg | INTRAMUSCULAR | Status: DC | PRN

## 2021-03-01 MED ORDER — MENTHOL 3 MG MT LOZG: 1.0000 | LOZENGE | OROMUCOSAL | Status: DC | PRN

## 2021-03-01 MED ORDER — OXYTOCIN-SODIUM CHLORIDE 30-0.9 UT/500ML-% IV SOLN
INTRAVENOUS | Status: AC
  Filled 2021-03-01: qty 500

## 2021-03-01 MED ORDER — DIBUCAINE (PERIANAL) 1 % EX OINT: 1.0000 "application " | TOPICAL_OINTMENT | CUTANEOUS | Status: DC | PRN

## 2021-03-01 MED ORDER — NALOXONE HCL 4 MG/10ML IJ SOLN
1.0000 ug/kg/h | INTRAVENOUS | Status: DC | PRN
  Filled 2021-03-01: qty 5

## 2021-03-01 MED ORDER — ONDANSETRON HCL 4 MG/2ML IJ SOLN: 4.0000 mg | Freq: Three times a day (TID) | INTRAMUSCULAR | Status: DC | PRN

## 2021-03-01 MED ORDER — OXYTOCIN-SODIUM CHLORIDE 30-0.9 UT/500ML-% IV SOLN: 2.5000 [IU]/h | INTRAVENOUS | Status: AC

## 2021-03-01 MED ORDER — GENTAMICIN SULFATE 40 MG/ML IJ SOLN
5.0000 mg/kg | Freq: Once | INTRAVENOUS | Status: AC
  Administered 2021-03-01: 380 mg via INTRAVENOUS
  Filled 2021-03-01: qty 9.5

## 2021-03-01 MED ORDER — KETOROLAC TROMETHAMINE 30 MG/ML IJ SOLN
30.0000 mg | Freq: Four times a day (QID) | INTRAMUSCULAR | Status: DC | PRN
  Filled 2021-03-01: qty 1

## 2021-03-01 MED ORDER — METHYLERGONOVINE MALEATE 0.2 MG/ML IJ SOLN
0.2000 mg | INTRAMUSCULAR | Status: DC | PRN
Start: 2021-03-01 — End: 2021-03-04

## 2021-03-01 MED ORDER — LACTATED RINGERS IV SOLN: INTRAVENOUS | Status: DC | PRN

## 2021-03-01 MED ORDER — HYDROMORPHONE HCL 1 MG/ML IJ SOLN
INTRAMUSCULAR | Status: DC | PRN
  Administered 2021-03-01: .5 mg via EPIDURAL

## 2021-03-01 MED ORDER — KETOROLAC TROMETHAMINE 30 MG/ML IJ SOLN
30.0000 mg | Freq: Four times a day (QID) | INTRAMUSCULAR | Status: AC
  Administered 2021-03-01 – 2021-03-02 (×2): 30 mg via INTRAVENOUS
  Filled 2021-03-01 (×2): qty 1

## 2021-03-01 MED ORDER — CARBOPROST TROMETHAMINE 250 MCG/ML IM SOLN
INTRAMUSCULAR | Status: DC | PRN
  Administered 2021-03-01: 250 ug via INTRAMUSCULAR

## 2021-03-01 MED ORDER — LEVOTHYROXINE SODIUM 75 MCG PO TABS
75.0000 ug | ORAL_TABLET | Freq: Every day | ORAL | Status: DC
  Administered 2021-03-02 – 2021-03-04 (×3): 75 ug via ORAL
  Filled 2021-03-01 (×3): qty 1

## 2021-03-01 MED ORDER — TRANEXAMIC ACID-NACL 1000-0.7 MG/100ML-% IV SOLN
INTRAVENOUS | Status: AC
  Filled 2021-03-01: qty 100

## 2021-03-01 MED ORDER — ACETAMINOPHEN 500 MG PO TABS: 1000.0000 mg | ORAL_TABLET | Freq: Once | ORAL | Status: DC

## 2021-03-01 MED ORDER — FENTANYL CITRATE (PF) 100 MCG/2ML IJ SOLN
INTRAMUSCULAR | Status: DC | PRN
  Administered 2021-03-01: 15 ug via INTRATHECAL

## 2021-03-01 MED ORDER — NALBUPHINE HCL 10 MG/ML IJ SOLN
5.0000 mg | Freq: Once | INTRAMUSCULAR | Status: DC | PRN
Start: 2021-03-01 — End: 2021-03-01

## 2021-03-01 MED ORDER — SIMETHICONE 80 MG PO CHEW
80.0000 mg | CHEWABLE_TABLET | Freq: Three times a day (TID) | ORAL | Status: DC
  Administered 2021-03-02 – 2021-03-04 (×7): 80 mg via ORAL
  Filled 2021-03-01 (×7): qty 1

## 2021-03-01 MED ORDER — TETANUS-DIPHTH-ACELL PERTUSSIS 5-2.5-18.5 LF-MCG/0.5 IM SUSY: 0.5000 mL | PREFILLED_SYRINGE | Freq: Once | INTRAMUSCULAR | Status: DC

## 2021-03-01 MED ORDER — FENTANYL CITRATE (PF) 100 MCG/2ML IJ SOLN: 25.0000 ug | INTRAMUSCULAR | Status: DC | PRN

## 2021-03-01 MED ORDER — BUPIVACAINE IN DEXTROSE 0.75-8.25 % IT SOLN
INTRATHECAL | Status: DC | PRN
  Administered 2021-03-01: 1.6 mL via INTRATHECAL

## 2021-03-01 MED ORDER — CLINDAMYCIN PHOSPHATE 900 MG/50ML IV SOLN
INTRAVENOUS | Status: AC
  Filled 2021-03-01: qty 50

## 2021-03-01 MED ORDER — METOPROLOL SUCCINATE ER 25 MG PO TB24
25.0000 mg | ORAL_TABLET | Freq: Two times a day (BID) | ORAL | Status: DC
  Administered 2021-03-01 – 2021-03-04 (×6): 25 mg via ORAL
  Filled 2021-03-01 (×7): qty 1

## 2021-03-01 MED ORDER — POVIDONE-IODINE 10 % EX SWAB
2.0000 "application " | Freq: Once | CUTANEOUS | Status: DC
  Administered 2021-03-01: 2 via TOPICAL

## 2021-03-01 MED ORDER — NALBUPHINE HCL 10 MG/ML IJ SOLN: 5.0000 mg | Freq: Once | INTRAMUSCULAR | Status: DC | PRN

## 2021-03-01 MED ORDER — KETOROLAC TROMETHAMINE 30 MG/ML IJ SOLN
30.0000 mg | Freq: Four times a day (QID) | INTRAMUSCULAR | Status: DC | PRN
  Administered 2021-03-01: 30 mg via INTRAVENOUS

## 2021-03-01 MED ORDER — TRANEXAMIC ACID-NACL 1000-0.7 MG/100ML-% IV SOLN
1000.0000 mg | INTRAVENOUS | Status: AC
  Administered 2021-03-01: 1000 mg via INTRAVENOUS

## 2021-03-01 MED ORDER — ONDANSETRON HCL 4 MG/2ML IJ SOLN
INTRAMUSCULAR | Status: DC | PRN
  Administered 2021-03-01: 4 mg via INTRAVENOUS

## 2021-03-01 MED ORDER — WITCH HAZEL-GLYCERIN EX PADS: 1.0000 "application " | MEDICATED_PAD | CUTANEOUS | Status: DC | PRN

## 2021-03-01 MED ORDER — PROMETHAZINE HCL 25 MG/ML IJ SOLN: 6.2500 mg | INTRAMUSCULAR | Status: DC | PRN

## 2021-03-01 MED ORDER — PHENYLEPHRINE 40 MCG/ML (10ML) SYRINGE FOR IV PUSH (FOR BLOOD PRESSURE SUPPORT)
PREFILLED_SYRINGE | INTRAVENOUS | Status: AC
  Filled 2021-03-01: qty 10

## 2021-03-01 MED ORDER — PHENYLEPHRINE HCL (PRESSORS) 10 MG/ML IV SOLN
INTRAVENOUS | Status: DC | PRN
  Administered 2021-03-01: 80 ug via INTRAVENOUS
  Administered 2021-03-01 (×2): 40 ug via INTRAVENOUS

## 2021-03-01 MED ORDER — CARBOPROST TROMETHAMINE 250 MCG/ML IM SOLN
INTRAMUSCULAR | Status: AC
  Filled 2021-03-01: qty 1

## 2021-03-01 MED ORDER — HYDROMORPHONE HCL 1 MG/ML IJ SOLN
INTRAMUSCULAR | Status: AC
  Filled 2021-03-01: qty 0.5

## 2021-03-01 MED ORDER — IBUPROFEN 600 MG PO TABS
600.0000 mg | ORAL_TABLET | Freq: Four times a day (QID) | ORAL | Status: DC
  Administered 2021-03-02 – 2021-03-04 (×9): 600 mg via ORAL
  Filled 2021-03-01 (×9): qty 1

## 2021-03-01 MED ORDER — ONDANSETRON HCL 4 MG/2ML IJ SOLN
INTRAMUSCULAR | Status: AC
  Filled 2021-03-01: qty 2

## 2021-03-01 MED ORDER — METHYLERGONOVINE MALEATE 0.2 MG PO TABS: 0.2000 mg | ORAL_TABLET | ORAL | Status: DC | PRN

## 2021-03-01 MED ORDER — DEXAMETHASONE SODIUM PHOSPHATE 4 MG/ML IJ SOLN
INTRAMUSCULAR | Status: AC
  Filled 2021-03-01: qty 1

## 2021-03-01 MED ORDER — PHENYLEPHRINE HCL-NACL 20-0.9 MG/250ML-% IV SOLN
INTRAVENOUS | Status: AC
  Filled 2021-03-01: qty 250

## 2021-03-01 MED ORDER — NALOXONE HCL 0.4 MG/ML IJ SOLN: 0.4000 mg | INTRAMUSCULAR | Status: DC | PRN

## 2021-03-01 MED ORDER — LIDOCAINE-EPINEPHRINE (PF) 2 %-1:200000 IJ SOLN
INTRAMUSCULAR | Status: DC | PRN
  Administered 2021-03-01: 3 mL via EPIDURAL

## 2021-03-01 MED ORDER — PRENATAL MULTIVITAMIN CH
1.0000 | ORAL_TABLET | Freq: Every day | ORAL | Status: DC
  Administered 2021-03-02 – 2021-03-04 (×3): 1 via ORAL
  Filled 2021-03-01 (×3): qty 1

## 2021-03-01 MED ORDER — SIMETHICONE 80 MG PO CHEW: 80.0000 mg | CHEWABLE_TABLET | ORAL | Status: DC | PRN

## 2021-03-01 MED ORDER — FENTANYL CITRATE (PF) 100 MCG/2ML IJ SOLN
INTRAMUSCULAR | Status: AC
  Filled 2021-03-01: qty 4

## 2021-03-01 MED ORDER — DEXAMETHASONE SODIUM PHOSPHATE 4 MG/ML IJ SOLN
INTRAMUSCULAR | Status: DC | PRN
  Administered 2021-03-01: 4 mg via INTRAVENOUS

## 2021-03-01 MED ORDER — SODIUM CHLORIDE 0.9% FLUSH: 3.0000 mL | INTRAVENOUS | Status: DC | PRN

## 2021-03-01 MED ORDER — HYDROMORPHONE HCL 2 MG PO TABS
2.0000 mg | ORAL_TABLET | ORAL | Status: DC | PRN
  Administered 2021-03-02 – 2021-03-04 (×7): 2 mg via ORAL
  Filled 2021-03-01 (×7): qty 1

## 2021-03-01 MED ORDER — PHENYLEPHRINE HCL-NACL 20-0.9 MG/250ML-% IV SOLN
INTRAVENOUS | Status: DC | PRN
  Administered 2021-03-01: 60 ug/min via INTRAVENOUS

## 2021-03-01 MED ORDER — CLINDAMYCIN PHOSPHATE 900 MG/50ML IV SOLN
900.0000 mg | Freq: Once | INTRAVENOUS | Status: AC
  Administered 2021-03-01: 900 mg via INTRAVENOUS

## 2021-03-01 MED ORDER — CEFAZOLIN SODIUM-DEXTROSE 2-4 GM/100ML-% IV SOLN: 2.0000 g | INTRAVENOUS | Status: DC

## 2021-03-01 SURGICAL SUPPLY — 36 items
BENZOIN TINCTURE PRP APPL 2/3 (GAUZE/BANDAGES/DRESSINGS) ×2 IMPLANT
CHLORAPREP W/TINT 26ML (MISCELLANEOUS) ×2 IMPLANT
CLAMP CORD UMBIL (MISCELLANEOUS) IMPLANT
CLOTH BEACON ORANGE TIMEOUT ST (SAFETY) ×2 IMPLANT
DRSG OPSITE POSTOP 4X10 (GAUZE/BANDAGES/DRESSINGS) ×2 IMPLANT
ELECT REM PT RETURN 9FT ADLT (ELECTROSURGICAL) ×2
ELECTRODE REM PT RTRN 9FT ADLT (ELECTROSURGICAL) ×1 IMPLANT
EXTRACTOR VACUUM M CUP 4 TUBE (SUCTIONS) IMPLANT
GLOVE BIO SURGEON STRL SZ7.5 (GLOVE) ×2 IMPLANT
GLOVE BIOGEL PI IND STRL 7.0 (GLOVE) ×1 IMPLANT
GLOVE BIOGEL PI INDICATOR 7.0 (GLOVE) ×1
GOWN STRL REUS W/TWL LRG LVL3 (GOWN DISPOSABLE) ×4 IMPLANT
KIT ABG SYR 3ML LUER SLIP (SYRINGE) IMPLANT
NEEDLE HYPO 22GX1.5 SAFETY (NEEDLE) ×2 IMPLANT
NEEDLE HYPO 25X5/8 SAFETYGLIDE (NEEDLE) IMPLANT
NEEDLE SPNL 20GX3.5 QUINCKE YW (NEEDLE) IMPLANT
NS IRRIG 1000ML POUR BTL (IV SOLUTION) ×2 IMPLANT
PACK C SECTION WH (CUSTOM PROCEDURE TRAY) ×2 IMPLANT
PAD ABD 8X10 STRL (GAUZE/BANDAGES/DRESSINGS) ×2 IMPLANT
PENCIL SMOKE EVAC W/HOLSTER (ELECTROSURGICAL) ×2 IMPLANT
RTRCTR C-SECT PINK 25CM LRG (MISCELLANEOUS) ×2 IMPLANT
STRIP CLOSURE SKIN 1/2X4 (GAUZE/BANDAGES/DRESSINGS) ×2 IMPLANT
SUT MNCRL 0 VIOLET CTX 36 (SUTURE) ×3 IMPLANT
SUT MNCRL AB 3-0 PS2 27 (SUTURE) IMPLANT
SUT MON AB 2-0 CT1 27 (SUTURE) ×2 IMPLANT
SUT MON AB-0 CT1 36 (SUTURE) ×4 IMPLANT
SUT MONOCRYL 0 CTX 36 (SUTURE) ×6
SUT PLAIN 0 NONE (SUTURE) IMPLANT
SUT PLAIN 2 0 (SUTURE)
SUT PLAIN 2 0 XLH (SUTURE) IMPLANT
SUT PLAIN ABS 2-0 CT1 27XMFL (SUTURE) IMPLANT
SYR 20CC LL (SYRINGE) IMPLANT
SYR CONTROL 10ML LL (SYRINGE) ×2 IMPLANT
TOWEL OR 17X24 6PK STRL BLUE (TOWEL DISPOSABLE) ×2 IMPLANT
TRAY FOLEY W/BAG SLVR 14FR LF (SET/KITS/TRAYS/PACK) ×2 IMPLANT
WATER STERILE IRR 1000ML POUR (IV SOLUTION) ×2 IMPLANT

## 2021-03-01 NOTE — Progress Notes (Signed)
Pharmacy Antibiotic Note  Alejandra Barr is a 35 y.o. female admitted on 03/01/2021 undergoing repeat c-section. Pharmacy has been consulted for gentamicin surgical prophylactic dosing.  Plan: Gentamicin 5 mg/kg (380 mg) x 1   Height: 5\' 1"  (154.9 cm) Weight: 117.9 kg (260 lb) IBW/kg (Calculated) : 47.8  Temp (24hrs), Avg:98.3 F (36.8 C), Min:98.3 F (36.8 C), Max:98.3 F (36.8 C)  Recent Labs  Lab 02/27/21 0917  WBC 11.0*    CrCl cannot be calculated (Patient's most recent lab result is older than the maximum 21 days allowed.).    Allergies  Allergen Reactions  . Ciprofloxacin Anaphylaxis  . Morphine And Related Anaphylaxis  . Penicillins Anaphylaxis    Has patient had a PCN reaction causing immediate rash, facial/tongue/throat swelling, SOB or lightheadedness with hypotension: No Has patient had a PCN reaction causing severe rash involving mucus membranes or skin necrosis: No Has patient had a PCN reaction that required hospitalization: No Has patient had a PCN reaction occurring within the last 10 years: No If all of the above answers are "NO", then may proceed with Cephalosporin use.    Yolanda Bonine, PharmD, MHSA, BCPPS 03/01/2021 10:24 AM

## 2021-03-01 NOTE — Anesthesia Procedure Notes (Signed)
Epidural Patient location during procedure: OR Start time: 03/01/2021 11:18 AM End time: 03/01/2021 11:22 AM  Staffing Anesthesiologist: Brennan Hadaway, MD Performed: anesthesiologist   Preanesthetic Checklist Completed: patient identified, IV checked, risks and benefits discussed, monitors and equipment checked, pre-op evaluation and timeout performed  Epidural Patient position: sitting Prep: DuraPrep and site prepped and draped Patient monitoring: heart rate, continuous pulse ox and blood pressure Approach: midline Location: L3-L4 Injection technique: LOR air  Needle:  Needle type: Tuohy  Needle gauge: 17 G Needle length: 9 cm Needle insertion depth: 7 cm Catheter type: closed end flexible Catheter size: 19 Gauge Catheter at skin depth: 12 cm Test dose: negative and 2% lidocaine with Epi 1:200 K  Assessment Sensory level: T4 Events: blood not aspirated, injection not painful, no injection resistance, no paresthesia and negative IV test  Additional Notes Patient identified. Risks, benefits, and alternatives discussed with patient including but not limited to bleeding, infection, nerve damage, paralysis, failed block, incomplete pain control, headache, blood pressure changes, nausea, vomiting, reactions to medication, itching, and postpartum back pain. Confirmed the patient's most recent platelet count. Confirmed with patient that they are not currently taking any anticoagulation, have any bleeding history, or any family history of bleeding disorders. Patient expressed understanding and wished to proceed. All questions were answered. Sterile technique was used throughout the entire procedure.    Crisp LOR with Tuohy needle on first pass. [place] 25g 116mm spinal needle introduced through Tuohy with clear CSF return prior to injection of intrathecal medication. Spinal needle withdrawn and epidural catheter threaded easily. Negative aspiration of catheter for heme or CSF. Reason  for block:surgical anesthesia

## 2021-03-01 NOTE — Transfer of Care (Signed)
Immediate Anesthesia Transfer of Care Note  Patient: Alejandra Barr  Procedure(s) Performed: Repeat CESAREAN SECTION (N/A )  Patient Location: PACU  Anesthesia Type:Spinal  Level of Consciousness: awake, alert  and patient cooperative  Airway & Oxygen Therapy: Patient Spontanous Breathing  Post-op Assessment: Report given to RN and Post -op Vital signs reviewed and stable  Post vital signs: Reviewed and stable  Last Vitals:  Vitals Value Taken Time  BP 100/59 03/01/21 1345  Temp    Pulse 71 03/01/21 1348  Resp 20 03/01/21 1348  SpO2 100 % 03/01/21 1348    Last Pain:  Vitals:   03/01/21 1330  TempSrc:   PainSc: 0-No pain         Complications: No complications documented.

## 2021-03-01 NOTE — H&P (Addendum)
Alejandra Barr is a 35 y.o. female presenting for rpt csection. OB History     Gravida  3   Para  2   Term  1   Preterm  1   AB      Living  3      SAB      IAB      Ectopic      Multiple  1   Live Births  3          Past Medical History:  Diagnosis Date  . Asthma   . Atrial flutter ([place])    Status post RFA by Dr. Lovena Le in 2004  . Blood transfusion without reported diagnosis   . Chronic anxiety   . Chronic depression   . Gestational diabetes   . History of postpartum hemorrhage, currently pregnant    with transfusion after discharge home  . 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 Iron deficiency   . Iron deficiency anemia 10/02/2010   Qualifier: Diagnosis of  By: Alejandra Barr, Alejandra Barr    . Low vitamin B12 level 07/10/2013   Overview:  Last Assessment & Plan:  Being treated through hematology  . Newborn product of in vitro fertilization (IVF) pregnancy   . Obesity   . PONV (postoperative nausea and vomiting)    Past Surgical History:  Procedure Laterality Date  . Bilateral eustachian tube placement on 4 different occasions    . CARDIAC ELECTROPHYSIOLOGY MAPPING AND ABLATION    . CESAREAN SECTION N/A 05/26/2017   Procedure: CESAREAN SECTION;  Surgeon: Alejandra Barr;  Location: [place];  Service: Obstetrics;  Laterality: N/A;  . CESAREAN SECTION MULTI-GESTATIONAL N/A 02/10/2019   Procedure: Repeat CESAREAN SECTION MULTI-GESTATIONAL;  Surgeon: Alejandra Barr;  Location: [place] [facility];  Service: Obstetrics;  Laterality: N/A;  EDD: 03/15/19 Allergy: Penicillin, Morphine, Barr  . CHOLECYSTECTOMY  2003  . COLONOSCOPY N/A 11/29/2013   Procedure: COLONOSCOPY;  Surgeon: Alejandra Houston, Barr;  [facility];  Service: Endoscopy;  Laterality: N/A;  120-rescheduled to 300 Ann notified pt  . GASTRIC BYPASS  2006  . TONSILLECTOMY  1990   Family History: family history includes Arrhythmia in her mother; Breast cancer (age of onset: 70) in her mother;  Diabetes in her maternal grandmother; Fibromyalgia in her mother; Heart defect in her mother; Heart disease in her paternal grandfather and paternal grandmother; Hypertension in her maternal grandfather, maternal grandmother, paternal grandfather, and paternal grandmother; Hypothyroidism in her mother; Obesity in her mother. Social History:  reports that she quit smoking about 5 years ago. Her smoking use included cigarettes. She started smoking about 7 years ago. She smoked 0.50 packs per day. She has never used smokeless tobacco. She reports that she does not drink alcohol and does not use drugs.     Maternal Diabetes: Yes:  Diabetes Type:  Insulin/Medication controlled Genetic Screening: Normal Maternal Ultrasounds/Referrals: Normal Fetal Ultrasounds or other Referrals:  None Maternal Substance Abuse:  No Significant Maternal Medications:  Meds include: Syntroid Other: metformin, Bblocker Significant Maternal Lab Results:  Group B Strep negative Other Comments:  None  Review of Systems  Constitutional: Negative.   All other systems reviewed and are negative.  Maternal Medical History:  Reason for admission: Contractions.   Contractions: Frequency: rare.    Fetal activity: Perceived fetal activity is normal.    Prenatal complications: no prenatal complications Prenatal Complications - Diabetes: gestational. Diabetes is managed by oral agent (monotherapy).  Last menstrual period 04/01/2020, unknown if currently breastfeeding. Maternal Exam:  Uterine Assessment: Contraction strength is mild.  Contraction frequency is rare.   Abdomen: Surgical scars: low transverse.   Fetal presentation: vertex  Introitus: Normal vulva. Normal vagina.  Ferning test: not done.  Nitrazine test: not done. Amniotic fluid character: not assessed.  Pelvis: questionable for delivery.      Physical Exam Vitals and nursing note reviewed. Exam conducted with a chaperone present.   Constitutional:      Appearance: Normal appearance.  HENT:     Head: Normocephalic and atraumatic.  Cardiovascular:     Rate and Rhythm: Normal rate and regular rhythm.     Pulses: Normal pulses.     Heart sounds: Normal heart sounds.  Pulmonary:     Effort: Pulmonary effort is normal.     Breath sounds: Normal breath sounds.  Abdominal:     Palpations: Abdomen is soft.  Genitourinary:    General: Normal vulva.  Musculoskeletal:        General: Normal range of motion.     Cervical back: Normal range of motion and neck supple.  Skin:    General: Skin is warm.  Neurological:     General: No focal deficit present.     Mental Status: She is alert and oriented to person, place, and time.  Psychiatric:        Mood and Affect: Mood normal.        Behavior: Behavior normal.     Prenatal labs: ABO, Rh: --/--/O POS (04/28 0935) Antibody: NEG (04/28 0935) Rubella: Immune (10/05 0000) RPR: NON REACTIVE (04/28 0917)  HBsAg: Negative (10/05 0000)  HIV: Non-reactive (10/05 0000)  GBS:   neg  Assessment/Plan: 39wk IUP A2DM on metformin Atrial flutter on bblocker PRevious csection x 2 Chronic anemia Rpt csection. Surgical risks discussed. Consent done.  Risks of anesthesia, infection, bleeding and injury to surrounding organs with need for repair discussed. Pt acknowledges and desires to proceed.   Jachai Okazaki J 03/01/2021, 7:26 AM

## 2021-03-01 NOTE — Lactation Note (Signed)
This note was copied from a baby's chart. 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 Mom's feeding choices is breast and formula feeding. Per mom, she had gastric bypass and hx of low milk supply , so she  prefers to breastfeed infant and supplement  Infant with formula afterwards , this is her feeding choice. LC observed mom's nipples are slightly flat  but responses well to stimulation, mom will pre-pump breast prior to latching infant at the breast.  Mom latched infant on her right breast using the football hold position, infant latched with depth and breastfeed for 13 minutes.  Afterwards dad supplemented infant with 8 mls of formula using a slow flow bottle nipple. While dad was supplementing infant, mom was using the DEBP as LC left the room. LC discussed infant's input and output with parents. Mom knows to pump every 3 hours for 15 minutes on initial setting. Mom shown how to use DEBP & how to disassemble, clean, & reassemble parts. Mom knows to call RN or LC if she has breastfeeding questions, concerns or needs further assistance with latching infant at the breast.  Mom made aware of O/P services, breastfeeding support groups, community resources, and our phone # for post-discharge questions.  Mom's current feeding plan: 1- BF infant according to cues, 8 to 12 or more times within 24 hours, STS. 2- Mom will supplement infant on Day 1 with 5-8 mls of formula her choice after each  feeding. 3- Mom will use the DEBP  every 3 hours for 15 minutes  on initial setting and give infant back any EBM first before offering formula.  Maternal Data Has patient been taught Hand  Expression?: Yes Does the patient have breastfeeding experience prior to this delivery?: Yes How long did the patient breastfeed?: Per mom, 1st child did not latch, twins one was in NICU she pumped only briefly they did not latch.  Feeding Mother's Current Feeding Choice: Breast Milk and Formula Nipple Type: Slow - flow  LATCH Score Latch: Grasps breast easily, tongue down, lips flanged, rhythmical sucking.  Audible Swallowing: Spontaneous and intermittent  Type of Nipple: Everted at rest and after stimulation (slightly  flat but responses well to stimulation, mom will pre-pump with hand pump prior to latching infant at the breast.)  Comfort (Breast/Nipple): Soft / non-tender  Hold (Positioning): Assistance needed to correctly position infant at breast and maintain latch.  LATCH Score: 9   Lactation Tools Discussed/Used Tools: Pump Breast pump type: Manual;Double-Electric Breast Pump Pump Education: Setup, frequency, and cleaning;Milk Storage Reason for Pumping: To help stimulate milk production mom with hx of low milk supply Pumping frequency: Mom knows to pump every 3 hours for 15 minutes on inital setting  Interventions Interventions: Breast feeding basics reviewed;Assisted with latch;Skin to skin;Hand express;Breast compression;Adjust position;Support pillows;Position options;Expressed milk;Hand pump;DEBP;Education  Discharge Pump: DEBP;Manual WIC Program: No  Consult Status Consult Status: Follow-up Date: 03/02/21 Follow-up type: In-patient    Vicente Serene 03/01/2021, 4:16 PM

## 2021-03-01 NOTE — Op Note (Signed)
Cesarean Section Procedure Note  Indications: previous uterine incision kerr x2  Pre-operative Diagnosis: 39 week 0 day pregnancy.  Post-operative Diagnosis: same  Surgeon: Lovenia Kim   Assistants: Ronnald Ramp, CNM  Anesthesia: Local anesthesia 0.25.% bupivacaine and Spinal anesthesia  ASA Class: 2  Procedure Details  The patient was seen in the Holding Room. The risks, benefits, complications, treatment options, and expected outcomes were discussed with the patient.  The patient concurred with the proposed plan, giving informed consent. The risks of anesthesia, infection, bleeding and possible injury to other organs discussed. Injury to bowel, bladder, or ureter with possible need for repair discussed. Possible need for transfusion with secondary risks of hepatitis or HIV acquisition discussed. Post operative complications to include but not limited to DVT, PE and Pneumonia noted. The site of surgery properly noted/marked. The patient was taken to Operating Room # C, identified as Alejandra Barr and the procedure verified as C-Section Delivery. A Time Out was held and the above information confirmed.  After induction of anesthesia, the patient was draped and prepped in the usual sterile manner. A Pfannenstiel incision was made and carried down through the subcutaneous tissue to the fascia. Fascial incision was made and extended transversely using Mayo scissors. The fascia was separated from the underlying rectus tissue superiorly and inferiorly. The peritoneum was identified and entered. Peritoneal incision was extended longitudinally. The utero-vesical peritoneal reflection was incised transversely and the bladder flap was bluntly freed from the lower uterine segment. A low transverse uterine incision(Kerr hysterotomy) was made. Delivered from OT presentation was a  female with Apgar scores of 8 at one minute and 9 at five minutes. Bulb suctioning gently performed. Neonatal team in  attendance.After the umbilical cord was clamped and cut cord blood was obtained for evaluation. The placenta was removed intact and appeared normal. The uterus was curetted with a dry lap pack. Good hemostasis was noted.The uterine outline, tubes and ovaries appeared normal. The uterine incision was closed with running locked sutures of 0 Monocryl x 2 layers. Hemostasis was observed. Lavage was carried out until clear.The parietal peritoneum was closed with a running 2-0 Monocryl suture. The fascia was then reapproximated with running sutures of 0 Monocryl. Interrupted O Monocryl sutures placed to support the closed attentuated fascia. The skin was reapproximated with 4-0 vicryl after [place] closure with 2-0 plain. .  Instrument, sponge, and needle counts were correct prior the abdominal closure and at the conclusion of the case.   Findings: FTLF, OT, anterior placenta, nl tubes and ovaries  Estimated Blood Loss:  500         Drains: foley                 Specimens: placenta                 Complications:  None; patient tolerated the procedure well.         Disposition: PACU - hemodynamically stable.         Condition: stable  Attending Attestation: I performed the procedure.

## 2021-03-01 NOTE — Anesthesia Postprocedure Evaluation (Signed)
Anesthesia Post Note  Patient: Alejandra Barr  Procedure(s) Performed: Repeat CESAREAN SECTION (N/A )     Patient location during evaluation: PACU Anesthesia Type: Combined Spinal/Epidural Level of consciousness: awake and alert and oriented Pain management: pain level controlled Vital Signs Assessment: post-procedure vital signs reviewed and stable Respiratory status: spontaneous breathing, nonlabored ventilation and respiratory function stable Cardiovascular status: blood pressure returned to baseline Postop Assessment: no apparent nausea or vomiting, spinal receding, no headache and no backache Anesthetic complications: no   No complications documented.  Last Vitals:  Vitals:   03/01/21 1515 03/01/21 1615  BP: (!) 113/50 112/68  Pulse: 78 75  Resp: 19 19  Temp: 36.9 C 36.6 C  SpO2: 99% 97%    Last Pain:  Vitals:   03/01/21 1615  TempSrc: Oral  PainSc:    Pain Goal:                   Brennan Muckleroy

## 2021-03-02 DIAGNOSIS — O24419 Gestational diabetes mellitus in pregnancy, unspecified control: Secondary | ICD-10-CM

## 2021-03-02 DIAGNOSIS — E039 Hypothyroidism, unspecified: Secondary | ICD-10-CM | POA: Diagnosis present

## 2021-03-02 LAB — CBC
HCT: 33.1 % — ABNORMAL LOW (ref 36.0–46.0)
Hemoglobin: 10.9 g/dL — ABNORMAL LOW (ref 12.0–15.0)
MCH: 30.7 pg (ref 26.0–34.0)
MCHC: 32.9 g/dL (ref 30.0–36.0)
MCV: 93.2 fL (ref 80.0–100.0)
Platelets: 166 10*3/uL (ref 150–400)
RBC: 3.55 MIL/uL — ABNORMAL LOW (ref 3.87–5.11)
RDW: 13.8 % (ref 11.5–15.5)
WBC: 12.2 10*3/uL — ABNORMAL HIGH (ref 4.0–10.5)
nRBC: 0 % (ref 0.0–0.2)

## 2021-03-02 MED ORDER — LACTATED RINGERS IV SOLN: INTRAVENOUS | Status: DC

## 2021-03-02 NOTE — Progress Notes (Signed)
Subjective: POD# 1 Live born female  Birth Weight: 7 lb 15.7 oz (3620 g) APGAR: 8, 8  Newborn Delivery   Birth date/time: [birth year]2021 11:48:00 Delivery type: C-Section, Low Transverse Trial of labor: No C-section categorization: Repeat     Baby name: Sophie Delivering provider: Brien Few   Feeding: breast, baby in NICU for low blood sugar  Pain control at delivery: None;Spinal   Reports feeling moderate pain but well controlled with medication. Baby was admitted to NICU early this morning for low blood sugar. Husband at the bedside.   Patient reports tolerating PO.   Pain controlled with acetaminophen, ibuprofen (OTC) and narcotic analgesics including Oxy IR Denies HA/SOB/C/P/N/V/dizziness. She reports vaginal bleeding as normal, without clots. She is ambulating and urinating without difficulty.     Objective:   Vitals:   03/01/21 1835 03/01/21 2144 03/02/21 0155 03/02/21 0558  BP:  103/61 (!) 98/56 130/78  Pulse:  81 68 79  Resp: 18 18 18 18   Temp: 98.2 F (36.8 C) 98.7 F (37.1 C) 98.6 F (37 C) 98.7 F (37.1 C)  TempSrc: Oral Oral Oral Oral  SpO2: 97% 98% 98% 100%  Weight:      Height:          Intake/Output Summary (Last 24 hours) at 03/02/2021 1041 Last data filed at 03/02/2021 0600 Gross per 24 hour  Intake 3299.08 ml  Output 1916 ml  Net 1383.08 ml        Recent Labs    03/02/21 0500  WBC 12.2*  HGB 10.9*  HCT 33.1*  PLT 166    Blood type: --/--/O POS (04/28 0935)  Rubella: Immune (10/05 0000)   Vaccines:   TDaP          UTD  Physical Exam:  General: alert and cooperative CV: Regular rate and rhythm Resp: clear Abdomen: soft, nontender, normal bowel sounds Incision: clean and dry, pressure dressing intact Uterine Fundus: firm, below umbilicus, nontender Lochia: minimal and moderate Ext: extremities normal, atraumatic, no cyanosis or edema  Assessment/Plan: 35 y.o.   POD# 1. [id number]                  Principal Problem:    Postpartum care following cesarean delivery 4/30  Encourage rest when baby rests  Breastfeeding support  Encourage to ambulate  Routine post-op care Active Problems:   GDM, A2  PP blood sugars WNL  Discontinue Metformin  F/U postpartum    Hypothyroidism  Continue Synthroid 42mcg   Morbid obesity (HCC)  Encourage ambulation   Iron deficiency anemia, chronic  Post-op CBC WNL   Atrial flutter (HCC)  Continue metoprolol   Asthma, well controlled  Albuterol prn   Previous cesarean section   History of postpartum hemorrhage, delayed  Bleeding is stable  S/P multiple agents during surgery to prevent excessive bleeding   RLTCS 4/30   Previous cesarean delivery affecting pregnancy  Anticipate discharge tomorrow.        Suzan Nailer, CNM, MSN 03/02/2021, 10:41 AM

## 2021-03-02 NOTE — Lactation Note (Signed)
This note was copied from a baby's chart. Lactation Consultation Note  Patient Name: Alejandra Barr [id number] Date: 03/02/2021 Reason for consult: Follow-up assessment;NICU baby;Other (Comment);Term (hypoglycemia) Age:35 years  Visited with mom of 35 years old FT female. Baby was transferred to NICU overnight due to hypoglycemia, mom had GDM during the pregnancy, she was on metformin. She has been set up with both a hand pump and a DEBP and reported she's been pumping every 3 hours but not getting any volume.  Educated mom on pumping expectations and praised her for her efforts. This consult took place in the NICU in baby's room. Baby has been mainly formula feeding due to feeding choice on admission, mom plans to do both breast and formula; she also has a Hx of a gastric bypass. Reviewed pumping schedule, benefits of breastmilk for NICU babies, supply/demand and lactogenesis II.  Feeding plan:  1. Encouraged mom to pump every 3 hours, ideally 8 pumping sessions/24 hours 2. Hand expression and breast massage were also encouraged prior pumping 3. STS care was also strongly encouraged  FOB present at the time of [facility] consultation. Mom reported all questions and concerns were answered, she's aware of [place] OP services and will call PRN.  Maternal Data    Feeding Mother's Current Feeding Choice: Breast Milk and Formula Nipple Type: Nfant Extra Slow Flow (gold)  Lactation Tools Discussed/Used Tools: Pump Breast pump type: Double-Electric Breast Pump;Manual Pump Education: Setup, frequency, and cleaning;Milk Storage Reason for Pumping: baby in NICU Pumping frequency: q 3 hours Pumped volume: 0 mL  Interventions Interventions: Breast feeding basics reviewed;Education;DEBP  Discharge Pump: DEBP;Manual  Consult Status Consult Status: Follow-up Date: 03/03/21 Follow-up type: [place] 03/02/2021, 1:22 PM

## 2021-03-03 DIAGNOSIS — D649 Anemia, unspecified: Secondary | ICD-10-CM

## 2021-03-03 MED ORDER — MAGNESIUM OXIDE -MG SUPPLEMENT 400 (240 MG) MG PO TABS
400.0000 mg | ORAL_TABLET | Freq: Every day | ORAL | Status: DC
  Administered 2021-03-03 – 2021-03-04 (×2): 400 mg via ORAL
  Filled 2021-03-03 (×2): qty 1

## 2021-03-03 MED ORDER — POLYSACCHARIDE IRON COMPLEX 150 MG PO CAPS
150.0000 mg | ORAL_CAPSULE | Freq: Every day | ORAL | Status: DC
  Administered 2021-03-03 – 2021-03-04 (×2): 150 mg via ORAL
  Filled 2021-03-03 (×2): qty 1

## 2021-03-03 NOTE — Lactation Note (Signed)
Lactation Consultation Note  Patient Name: Alejandra Barr [id number] Date: 03/03/2021    Parents report infant just go tback from NICU.  Offered to assist with breastfeeding.  Parents denied.  Infant cuing/crying and dad trying to feed formula.  Urged to offer the breast first/pump and feed back all expressed mothers milk and formula as prescribed.  Call lactation as needed.

## 2021-03-03 NOTE — Progress Notes (Signed)
CSW received consult for hx of Anxiety and Depression.  CSW met with [name] to offer support and complete assessment.    CSW checked in with infant's RN and was informed that infant will potential transfer to MBU early evening if she remains on the same feed pattern.   When CSW arrived to [name]'s room, [name] was resting in bed and FOB was resting on the couch.  Without prompting, [name] happily shared the possibility that infant will be transferred to the MBU.  [name] and FOB appeared to have a good understanding of infant's health as evidence by them updating CSW about infant's progress.   CSW explained CSW's role and [name] gave CSW permission to complete assessment while FOB was present. The couple was easy to engage, receptive to meeting with CSW, and appeared to be supportive of one another.   CSW provided education regarding the baby blues period vs. perinatal mood disorders, discussed treatment and gave resources for mental health follow up if concerns arise.  CSW recommends self-evaluation during the postpartum time period using the New Mom Checklist from Postpartum Progress and encouraged [name] to contact a medical professional if symptoms are noted at any time. [name] presented with insight and awareness and did not demonstrate any acute MH symptoms. [name] denied having PPD with her older children however she acknowledged a hx of anxiety and depression.  [name] reported that she was dx over 12 years ago and contributed her symptosis to her alcohol abuse. Per [name], she has been sober for over 9 years and all her symptoms have subsided. [name] reported having a good support team and expressed feeling comfortable seeking help if needed.     CSW identifies no further need for intervention and no barriers to discharge at this time.  Alejandra Barr, MSW, LCSW Clinical Social Work [phone] 

## 2021-03-03 NOTE — Progress Notes (Addendum)
POSTOPERATIVE DAY # 2 S/P Repeat LTCS, baby girl in NICU "Gabriela Eves"   S:         Reports feeling overall okay, reports some left-sided burning incisional pain. Reports concern over lump she felt in the back of her left arm, states she is unsure if it is a lipoma. She states she noticed it 3 weeks ago.  Expresses concern that her mother had HER-2 positive premenopausal breast cancer in her late 78s and multiple maternal aunts with breast cancer. States she generally has a yearly mammogram, but has been behind due to pregnancy. Denies having hereditary gynecologic genetic testing. Breast exam offered and patient agrees.              Tolerating po intake / no nausea / no vomiting / + flatus / + BM x1  Denies dizziness, SOB, or CP             Bleeding is getting lighter              Pain controlled with Motrin and Dilaudid             Up ad lib / ambulatory/ voiding QS  Newborn stable in NICU - mom desires to breastfeeding/currently pumping colostrum   O:  VS: BP 135/77 (BP Location: Right Arm)   Pulse 76   Temp 98.2 F (36.8 C) (Oral)   Resp 18   Ht _0  (1.549 m)   Wt 117.9 kg   LMP 04/01/2020   SpO2 99%   BMI 49.13 kg/m   Patient Vitals for the past 24 hrs:  BP Temp Temp src Pulse Resp SpO2  03/03/21 0506 135/77 98.2 F (36.8 C) Oral 76 18 99 %  03/02/21 2105 125/81 98.6 F (37 C) Oral 85 18 100 %  03/02/21 1551 112/72 98.1 F (36.7 C) Oral 80 18 100 %     LABS:              Recent Labs    03/02/21 0500  WBC 12.2*  HGB 10.9*  PLT 166               Bloodtype: --/--/O POS (04/28 0935)  Rubella: Immune (10/05 0000)                                             I&O: Intake/Output      05/01 0701 05/02 0700 05/02 0701 05/03 0700   I.V. (mL/kg)     Other     IV Piggyback     Total Intake(mL/kg)     Urine (mL/kg/hr)     Blood     Total Output     Net            Tdap: UTD Flu: Covid:              Physical Exam:             Alert and Oriented X3  Lungs: Clear  and unlabored  Heart: regular rate and rhythm / no murmurs  Breast: no masses, no nodes, no spontaneous nipple discharge, no skin changes, no dimpling bilaterally, dense breast tissue bilaterally noted. Right breast: prominent vein noted (stable, unchanged per patient)  Left upper extremity: small, superficial marble sized nodule noted in subcutaneous tissue in the back of her arm  Abdomen: soft, non-tender, non-distended, obese, active bowel  sounds             Fundus: firm, non-tender, below umbilicus             Dressing: honeycomb with steri-strips clean, right side of dressing peeling up with some moisture noted under dressing              Incision:  approximated with sutures / no erythema / no ecchymosis / no drainage  Perineum: intact  Lochia: appropriate   Extremities: trace LE edema, no calf pain or tenderness,   A/P:     POD # 2 S/P Repeat LTCS            Postpartum care following cesarean delivery 4/30             Breastfeeding support in NICU PRN             Encourage to ambulate             Routine post-op care Active Problems:   GDM, A2             PP blood sugars WNL             Discontinue Metformin             F/U postpartum    Hypothyroidism             Continue Synthroid 30mg   Morbid obesity (HCC)             Encourage ambulation   Acute on chronic blood loss anemia              Recommend starting Niferex 1570mPO daily  Magnesium oxide 40046maily   Atrial flutter (HCC)             Continue Metoprolol XL 63m39mily    Asthma, well controlled             Albuterol prn   Previous cesarean section   History of postpartum hemorrhage, delayed             Bleeding is stable             S/P multiple agents during surgery to prevent excessive bleeding   RLTCS 4/30   Previous cesarean delivery affecting pregnancy   Recommend changing honeycomb dressing after shower   Superficial subcutaneous nodule ?lipoma vs. Other  Recommend PP evaluation with Dr. TaavRonita Hipps  message sent to WOB office to schedule. Also discussed and recommended genetic testing due to family hx  Baby hopeful to be transferred back to central nursery later today, plan for discharge home tomorrow   MereLars PinksN, CNM [facility]

## 2021-03-03 NOTE — Addendum Note (Signed)
Addendum  created 03/03/21 1743 by Brennan Ahles, MD   Intraprocedure Staff edited

## 2021-03-04 MED ORDER — MAGNESIUM OXIDE -MG SUPPLEMENT 400 (240 MG) MG PO TABS: 400.0000 mg | ORAL_TABLET | Freq: Every day | ORAL | 1 refills | Status: DC

## 2021-03-04 MED ORDER — IBUPROFEN 600 MG PO TABS: 600.0000 mg | ORAL_TABLET | Freq: Four times a day (QID) | ORAL | 0 refills | Status: DC

## 2021-03-04 MED ORDER — HYDROMORPHONE HCL 2 MG PO TABS: 2.0000 mg | ORAL_TABLET | ORAL | 0 refills | Status: DC | PRN

## 2021-03-04 MED ORDER — POLYSACCHARIDE IRON COMPLEX 150 MG PO CAPS: 150.0000 mg | ORAL_CAPSULE | Freq: Every day | ORAL | 1 refills | Status: DC

## 2021-03-04 NOTE — Discharge Summary (Signed)
OB Discharge Summary  Patient Name: Alejandra Barr DOB: [birth year] MRN: [id number]  Date of admission: 03/01/2021 Delivering provider: Brien Few   Admitting diagnosis: Previous cesarean section [Z98.891] Previous cesarean delivery affecting pregnancy [O34.219] Intrauterine pregnancy: [redacted]w[redacted]d    Secondary diagnosis: Patient Active Problem List   Diagnosis Date Noted  . Acute on chronic anemia 03/03/2021  . Hypothyroidism 03/02/2021  . GDM, class A2 03/02/2021  . Previous cesarean section 03/01/2021  . History of postpartum hemorrhage, delayed 03/01/2021  . RLTCS 4/30 03/01/2021  . Postpartum care following cesarean delivery 4/30 03/01/2021  . Previous cesarean delivery affecting pregnancy 03/01/2021  . Iron deficiency anemia 10/02/2010  . [place] ([facility]26/2009  . Morbid [place] 01/09/2008  . Asthma, well controlled 01/09/2008    Date of discharge: 03/04/2021   Discharge diagnosis: Principal Problem:   Postpartum care following cesarean delivery 4/30 Active Problems:   Morbid obesity (HCC)   Iron deficiency anemia   Atrial flutter (HCC)   Asthma, well controlled   Previous cesarean section   History of postpartum hemorrhage, delayed   RLTCS 4/30   Previous cesarean delivery affecting pregnancy   Hypothyroidism   GDM, class A2   Acute on chronic anemia                                                             Post partum procedures:None  Augmentation: N/A Pain control: None;Spinal  Laceration:None  Episiotomy:None  Complications: None  Hospital course:  Scheduled C/S   35y.o. yo [id number] admitted to the hospital 03/01/2021 for scheduled cesarean section with the following indication:Elective Repeat.Delivery details are as follows:  Membrane Rupture Time/Date:  ,   Delivery Method:C-Section, Low Transverse  Details of operation can be found in separate operative note.  Patient had an uncomplicated postpartum course.  She is ambulating,  tolerating a regular diet, passing flatus, and urinating well. Patient is discharged home in stable condition on  03/04/21        Newborn Data: Birth date:03/01/2021  Birth time:11:48 AM  Gender:Female  Living status:Living  Apgars:8 ,8  Weight:3620 g     Physical exam  Vitals:   03/03/21 0506 03/03/21 1400 03/03/21 2129 03/04/21 0610  BP: 135/77 113/64 134/71 (!) 141/89  Pulse: 76 72 85 75  Resp: _0 Temp: 98.2 F (36.8 C) 97.9 F (36.6 C) 98.4 F (36.9 C) 98.3 F (36.8 C)  TempSrc: Oral Oral Oral Oral  SpO2: 99%   100%  Weight:      Height:       General: alert, cooperative and no distress Lochia: appropriate Uterine Fundus: firm Incision: Healing well with no significant drainage, Dressing is clean, dry, and intact DVT Evaluation: No evidence of DVT seen on physical exam. No significant calf/ankle edema. Labs: Lab Results  Component Value Date   WBC 12.2 (H) 03/02/2021   HGB 10.9 (L) 03/02/2021   HCT 33.1 (L) 03/02/2021   MCV 93.2 03/02/2021   PLT 166 03/02/2021   CMP Latest Ref Rng & Units 12/04/2020  Glucose 70 - 99 mg/dL 89  BUN 6 - 20 mg/dL 7  Creatinine 0.44 - 1.00 mg/dL 0.55  Sodium 135 - 145 mmol/L 134(L)  Potassium 3.5 - 5.1 mmol/L 4.0  Chloride  98 - 111 mmol/L 107  CO2 22 - 32 mmol/L 21(L)  Calcium 8.9 - 10.3 mg/dL 8.4(L)  Total Protein 6.5 - 8.1 g/dL 6.1(L)  Total Bilirubin 0.3 - 1.2 mg/dL 0.3  Alkaline Phos 38 - 126 U/L 91  AST 15 - 41 U/L 10(L)  ALT 0 - 44 U/L 6   Edinburgh Postnatal Depression Scale Screening Tool 03/02/2021 03/02/2021  I have been able to laugh and see the funny side of things. 0 (No Data)  I have looked forward with enjoyment to things. 0 -  I have blamed myself unnecessarily when things went wrong. 0 -  I have been anxious or worried for no good reason. 1 -  I have felt scared or panicky for no good reason. 0 -  Things have been getting on top of me. 0 -  I have been so unhappy that I have had difficulty sleeping. 0  -  I have felt sad or miserable. 0 -  I have been so unhappy that I have been crying. 0 -  The thought of harming myself has occurred to me. 0 -  Edinburgh Postnatal Depression Scale Total 1 -    Vaccines: TDaP UTD         Discharge instructions:  per After Visit Summary   After Visit Meds:  Allergies as of 03/04/2021      Reactions   Ciprofloxacin Anaphylaxis   Morphine And Related Anaphylaxis   Pt states she can tolerate hydromorphone   Penicillins Anaphylaxis   Has patient had a PCN reaction causing immediate rash, facial/tongue/throat swelling, SOB or lightheadedness with hypotension: No Has patient had a PCN reaction causing severe rash involving mucus membranes or skin necrosis: No Has patient had a PCN reaction that required hospitalization: No Has patient had a PCN reaction occurring within the last 10 years: No If all of the above answers are "NO", then may proceed with Cephalosporin use.      Medication List    STOP taking these medications   Cyanocobalamin 1000 MCG/ML Kit   ferrous sulfate 300 (60 Fe) WP/8KD syrup   folic acid 983 MCG tablet Commonly known as: FOLVITE   metFORMIN 500 MG tablet Commonly known as: GLUCOPHAGE     TAKE these medications   albuterol 108 (90 Base) MCG/ACT inhaler Commonly known as: VENTOLIN HFA Inhale 2 puffs into the lungs every 6 (six) hours as needed for wheezing or shortness of breath.   HYDROmorphone 2 MG tablet Commonly known as: DILAUDID Take 1 tablet (2 mg total) by mouth every 4 (four) hours as needed for severe pain ((when tolerating fluids)).   ibuprofen 600 MG tablet Commonly known as: ADVIL Take 1 tablet (600 mg total) by mouth every 6 (six) hours.   iron polysaccharides 150 MG capsule Commonly known as: Ferrex 150 Take 1 capsule (150 mg total) by mouth daily.   levothyroxine 75 MCG tablet Commonly known as: SYNTHROID Take 75 mcg by mouth daily before breakfast.   magnesium oxide 400 (240 Mg) MG  tablet Commonly known as: MAG-OX Take 1 tablet (400 mg total) by mouth daily. Start taking on: Mar 05, 2021   metoprolol succinate 25 MG 24 hr tablet Commonly known as: TOPROL-XL Take 2 tablets (50 mg total) by mouth daily. What changed:   how much to take  when to take this   PRENATAL PO Take 1 tablet by mouth daily.   sennosides-docusate sodium 8.6-50 MG tablet Commonly known as: SENOKOT-S Take 1 tablet by  mouth daily as needed for constipation.   Vitamin D (Ergocalciferol) 1.25 MG (50000 UNIT) Caps capsule Commonly known as: DRISDOL Take 1 capsule (50,000 Units total) by mouth once a week.      Diet: routine diet  Activity: Advance as tolerated. Pelvic rest for 6 weeks.   Newborn Data: Live born female  Birth Weight: 7 lb 15.7 oz (3620 g) APGAR: 8, 8  Newborn Delivery   Birth date/time: 03/01/2021 11:48:00 Delivery type: C-Section, Low Transverse Trial of labor: No C-section categorization: Repeat     Named Sophie Baby Feeding: Bottle Disposition:home with mother  Delivery Report:  Review the Delivery Report for details.    Follow up:  Follow-up Information    Brien Few, MD. Schedule an appointment as soon as possible for a visit in 6 week(s).   Specialty: Obstetrics and Gynecology Contact information: [place] [address] [address], CNM, MSN 03/04/2021, 10:31 AM

## 2021-03-04 NOTE — Discharge Instructions (Signed)

## 2021-03-11 DIAGNOSIS — O9089 Other complications of the puerperium, not elsewhere classified: Secondary | ICD-10-CM | POA: Diagnosis not present

## 2021-03-14 DIAGNOSIS — G43701 Chronic migraine without aura, not intractable, with status migrainosus: Secondary | ICD-10-CM | POA: Diagnosis not present

## 2021-03-25 ENCOUNTER — Encounter (HOSPITAL_COMMUNITY): Payer: Self-pay | Admitting: Hematology

## 2021-04-14 DIAGNOSIS — E039 Hypothyroidism, unspecified: Secondary | ICD-10-CM | POA: Diagnosis not present

## 2021-04-14 DIAGNOSIS — Z124 Encounter for screening for malignant neoplasm of cervix: Secondary | ICD-10-CM | POA: Diagnosis not present

## 2021-04-29 DIAGNOSIS — G43711 Chronic migraine without aura, intractable, with status migrainosus: Secondary | ICD-10-CM | POA: Diagnosis not present

## 2021-04-29 DIAGNOSIS — G43719 Chronic migraine without aura, intractable, without status migrainosus: Secondary | ICD-10-CM | POA: Diagnosis not present

## 2021-05-07 ENCOUNTER — Other Ambulatory Visit: Payer: Self-pay

## 2021-05-07 ENCOUNTER — Inpatient Hospital Stay (HOSPITAL_COMMUNITY): Payer: BC Managed Care – PPO | Attending: Hematology

## 2021-05-07 DIAGNOSIS — E538 Deficiency of other specified B group vitamins: Secondary | ICD-10-CM | POA: Diagnosis not present

## 2021-05-07 DIAGNOSIS — D509 Iron deficiency anemia, unspecified: Secondary | ICD-10-CM | POA: Insufficient documentation

## 2021-05-07 DIAGNOSIS — D508 Other iron deficiency anemias: Secondary | ICD-10-CM

## 2021-05-07 LAB — CBC WITH DIFFERENTIAL/PLATELET
Abs Immature Granulocytes: 0.06 10*3/uL (ref 0.00–0.07)
Basophils Absolute: 0.1 10*3/uL (ref 0.0–0.1)
Basophils Relative: 1 %
Eosinophils Absolute: 0.4 10*3/uL (ref 0.0–0.5)
Eosinophils Relative: 4 %
HCT: 36.1 % (ref 36.0–46.0)
Hemoglobin: 11.8 g/dL — ABNORMAL LOW (ref 12.0–15.0)
Immature Granulocytes: 1 %
Lymphocytes Relative: 21 %
Lymphs Abs: 1.9 10*3/uL (ref 0.7–4.0)
MCH: 31 pg (ref 26.0–34.0)
MCHC: 32.7 g/dL (ref 30.0–36.0)
MCV: 94.8 fL (ref 80.0–100.0)
Monocytes Absolute: 0.5 10*3/uL (ref 0.1–1.0)
Monocytes Relative: 5 %
Neutro Abs: 6.2 10*3/uL (ref 1.7–7.7)
Neutrophils Relative %: 68 %
Platelets: 265 10*3/uL (ref 150–400)
RBC: 3.81 MIL/uL — ABNORMAL LOW (ref 3.87–5.11)
RDW: 13.3 % (ref 11.5–15.5)
WBC: 9.1 10*3/uL (ref 4.0–10.5)
nRBC: 0 % (ref 0.0–0.2)

## 2021-05-07 LAB — FERRITIN: Ferritin: 107 ng/mL (ref 11–307)

## 2021-05-07 LAB — IRON AND TIBC
Iron: 48 ug/dL (ref 28–170)
Saturation Ratios: 17 % (ref 10.4–31.8)
TIBC: 289 ug/dL (ref 250–450)
UIBC: 241 ug/dL

## 2021-05-07 LAB — FOLATE: Folate: 10.1 ng/mL (ref >5.9)

## 2021-05-07 LAB — VITAMIN B12: Vitamin B-12: 213 pg/mL (ref 180–914)

## 2021-05-11 LAB — METHYLMALONIC ACID, SERUM: Methylmalonic Acid, Quantitative: 100 nmol/L (ref 0–378)

## 2021-05-14 ENCOUNTER — Ambulatory Visit (HOSPITAL_COMMUNITY): Payer: BC Managed Care – PPO | Admitting: Hematology and Oncology

## 2021-05-15 ENCOUNTER — Inpatient Hospital Stay (HOSPITAL_COMMUNITY): Payer: BC Managed Care – PPO | Admitting: Physician Assistant

## 2021-05-20 ENCOUNTER — Ambulatory Visit (HOSPITAL_COMMUNITY): Payer: BC Managed Care – PPO | Admitting: Physician Assistant

## 2021-05-22 ENCOUNTER — Encounter (HOSPITAL_COMMUNITY): Payer: Self-pay

## 2021-05-23 DIAGNOSIS — G43711 Chronic migraine without aura, intractable, with status migrainosus: Secondary | ICD-10-CM | POA: Diagnosis not present

## 2021-05-23 DIAGNOSIS — G43719 Chronic migraine without aura, intractable, without status migrainosus: Secondary | ICD-10-CM | POA: Diagnosis not present

## 2021-05-31 ENCOUNTER — Other Ambulatory Visit: Payer: Self-pay | Admitting: Family Medicine

## 2021-06-02 NOTE — Progress Notes (Signed)
Virtual Visit via Telephone Note [facility]  I connected with Alejandra Barr on 06/03/2021 at 9:00 AM by telephone and verified that I am speaking with the correct person using two identifiers.  Location: Patient: Home Provider: [facility]   I discussed the limitations, risks, security and privacy concerns of performing an evaluation and management service by telephone and the availability of in person appointments. I also discussed with the patient that there may be a patient responsible charge related to this service. The patient expressed understanding and agreed to proceed.   History of Present Illness: Alejandra Barr follows at our clinic for iron deficiency anemia, vitamin B12 deficiency, and vitamin D deficiency.  She was last seen by Dr. Delton Coombes on 12/11/2020.  Since her last visit, she delivered her baby daughter via cesarean section on 03/01/2021.  She reports that she is doing fairly well.  No other recent hospitalizations, surgeries, or changes in her baseline health status.  Patient reports that she did have some heavy postpartum bleeding for about 8 weeks after her C-section, and now has irregular menses (twice monthly).  She denies any other abnormal bleeding such as epistaxis, hematochezia, or melena.  No B symptoms.  She reports that she is mildly fatigued, with energy about 75%.  No chest pain, dyspnea, or syncope.  She is having some palpitations, and is being worked up by cardiology for arrhythmia.  She continues to take her recommended supplements, including iron tablet once daily, prenatal vitamins, B12 injections weekly, vitamin D weekly, and folic acid daily.  She reports that her energy is 75%, with 100% appetite.  She reports that she is maintaining a stable weight at this time.    Observations/Objective: Review of Systems  Constitutional:  Positive for malaise/fatigue (energy 75%). Negative for chills, diaphoresis, fever and  weight loss.  Respiratory:  Negative for cough and shortness of breath.   Cardiovascular:  Positive for palpitations. Negative for chest pain.  Gastrointestinal:  Negative for abdominal pain, blood in stool, melena, nausea and vomiting.  Neurological:  Positive for headaches. Negative for dizziness.    PHYSICAL EXAM (per limitations of virtual telephone visit): The patient is alert and oriented x 3, exhibiting adequate mentation, good mood, and ability to speak in full sentences and execute sound judgement.   ASSESSMENT & PLAN: 1.  Iron deficiency anemia: - Venofer on 12/13/2020 on 12/20/2020. - lood count had normalized prior to delivery of her recent baby (dropped to 10.9 on 03/02/2021), but has been recovering her blood counts since that time - Most recent labs (05/07/2021): Remains mildly anemic with Hgb 11.8, ferritin 107, iron saturation 17% - Currently taking prenatal vitamin and ferrous sulfate 325 mg daily. - PLAN: No indication for IV iron therapy at this time.  Continue prenatal vitamin and oral iron tablet once daily.  RTC in 6 months with repeat labs.   2.  Vitamin B12 deficiency: - Currently taking B12 injection once weekly - Most recent B12 level (05/07/2021): Normal at 213, methylmalonic acid normal at 100 - PLAN: Continue B12 injections once weekly.   3.  Vitamin D deficiency: - Most recent vitamin D level (02/05/2021) normal at 32.03 - Currently taking ergocalciferol 50,000 units weekly - PLAN: Continue vitamin D once a week   4.  Folic acid deficiency: - Folate checked on 05/07/2021, normal at 10.1 - Currently taking folic acid 1 mg daily - PLAN: Continue folic acid supplement    FOLLOW UP INSTRUCTIONS: -Labs and  RTC (phone visit okay) in 6 months    I discussed the assessment and treatment plan with the patient. The patient was provided an opportunity to ask questions and all were answered. The patient agreed with the plan and demonstrated an understanding of the  instructions.   The patient was advised to call back or seek an in-person evaluation if the symptoms worsen or if the condition fails to improve as anticipated.  I provided 13 minutes of non-face-to-face time during this encounter.   Harriett Rush, PA-C 06/03/2021 10:30 AM

## 2021-06-03 ENCOUNTER — Other Ambulatory Visit: Payer: Self-pay

## 2021-06-03 ENCOUNTER — Inpatient Hospital Stay (HOSPITAL_COMMUNITY): Payer: BC Managed Care – PPO | Attending: Physician Assistant | Admitting: Physician Assistant

## 2021-06-03 DIAGNOSIS — E559 Vitamin D deficiency, unspecified: Secondary | ICD-10-CM | POA: Diagnosis not present

## 2021-06-03 DIAGNOSIS — D508 Other iron deficiency anemias: Secondary | ICD-10-CM

## 2021-06-03 DIAGNOSIS — G43701 Chronic migraine without aura, not intractable, with status migrainosus: Secondary | ICD-10-CM | POA: Diagnosis not present

## 2021-06-03 DIAGNOSIS — E538 Deficiency of other specified B group vitamins: Secondary | ICD-10-CM | POA: Diagnosis not present

## 2021-06-03 MED ORDER — VITAMIN D (ERGOCALCIFEROL) 1.25 MG (50000 UNIT) PO CAPS: 50000.0000 [IU] | ORAL_CAPSULE | ORAL | 4 refills | Status: DC

## 2021-06-06 DIAGNOSIS — G43701 Chronic migraine without aura, not intractable, with status migrainosus: Secondary | ICD-10-CM | POA: Diagnosis not present

## 2021-06-19 ENCOUNTER — Ambulatory Visit (INDEPENDENT_AMBULATORY_CARE_PROVIDER_SITE_OTHER): Payer: BC Managed Care – PPO | Admitting: Family Medicine

## 2021-06-19 ENCOUNTER — Encounter: Payer: Self-pay | Admitting: Family Medicine

## 2021-06-19 ENCOUNTER — Other Ambulatory Visit: Payer: Self-pay

## 2021-06-19 VITALS — BP 116/74 | HR 83 | Resp 16 | Ht 61.0 in | Wt 223.8 lb

## 2021-06-19 DIAGNOSIS — M549 Dorsalgia, unspecified: Secondary | ICD-10-CM

## 2021-06-19 DIAGNOSIS — G8929 Other chronic pain: Secondary | ICD-10-CM

## 2021-06-19 DIAGNOSIS — R7301 Impaired fasting glucose: Secondary | ICD-10-CM | POA: Diagnosis not present

## 2021-06-19 DIAGNOSIS — M5416 Radiculopathy, lumbar region: Secondary | ICD-10-CM | POA: Insufficient documentation

## 2021-06-19 DIAGNOSIS — R519 Headache, unspecified: Secondary | ICD-10-CM

## 2021-06-19 DIAGNOSIS — E559 Vitamin D deficiency, unspecified: Secondary | ICD-10-CM

## 2021-06-19 DIAGNOSIS — Z13220 Encounter for screening for lipoid disorders: Secondary | ICD-10-CM

## 2021-06-19 DIAGNOSIS — M542 Cervicalgia: Secondary | ICD-10-CM | POA: Diagnosis not present

## 2021-06-19 DIAGNOSIS — E039 Hypothyroidism, unspecified: Secondary | ICD-10-CM

## 2021-06-19 MED ORDER — LEVOTHYROXINE SODIUM 75 MCG PO TABS: 75.0000 ug | ORAL_TABLET | Freq: Every day | ORAL | 3 refills | Status: DC

## 2021-06-19 NOTE — Patient Instructions (Signed)
F/U in 4 months, call if you need me sooner  You are referred for MRI of your neck and upper back  Please get fasting lipid, cmp an EGF, hBa1C and TSH and vit D this week, will refill thyroid medication today as you say recent test was  normal  It is important that you exercise regularly at least 30 minutes 5 times a week. If you develop chest pain, have severe difficulty breathing, or feel very tired, stop exercising immediately and seek medical attention   Think about what you will eat, plan ahead. Choose " clean, green, fresh or frozen" over canned, processed or packaged foods which are more sugary, salty and fatty. 70 to 75% of food eaten should be vegetables and fruit. Three meals at set times with snacks allowed between meals, but they must be fruit or vegetables. Aim to eat over a 12 hour period , example 7 am to 7 pm, and STOP after  your last meal of the day. Drink water,generally about 64 ounces per day, no other drink is as healthy. Fruit juice is best enjoyed in a healthy way, by EATING the fruit.  Thanks for choosing [facility], we consider it a privelige to serve you.

## 2021-06-23 ENCOUNTER — Encounter: Payer: Self-pay | Admitting: Family Medicine

## 2021-06-23 DIAGNOSIS — G8929 Other chronic pain: Secondary | ICD-10-CM | POA: Insufficient documentation

## 2021-06-23 DIAGNOSIS — R519 Headache, unspecified: Secondary | ICD-10-CM | POA: Insufficient documentation

## 2021-06-23 NOTE — Progress Notes (Signed)
   Alejandra Barr     MRN: [id number]      DOB: 05-16-1986   HPI [name] is here for follow up and re-evaluation of chronic medical conditions, medication management and review of any available recent lab and radiology data.  Preventive health is updated, specifically  Cancer screening and Immunization.   Questions or concerns regarding consultations or procedures which the PT has had in the interim are  addressed. The PT denies any adverse reactions to current medications since the last visit.  3 week h/o uncontrolled neck and upper back pin preventing her from caring for her children, rated at an 8 most of the time, had abn MRI several years ago and wants updat. Managed by pain clinic/ neurology for headaches and pain   ROS Denies recent fever or chills. Denies sinus pressure, nasal congestion, ear pain or sore throat. Denies chest congestion, productive cough or wheezing. Denies chest pains, palpitations and leg swelling Denies abdominal pain, nausea, vomiting,diarrhea or constipation.   Denies dysuria, frequency, hesitancy or incontinence. . Denies  uncontrolled headaches, seizures, numbness, or tingling.  Denies skin break down or rash.   PE  BP 116/74   Pulse 83   Resp 16   Ht '5\' 1"'$  (1.549 m)   Wt 223 lb 12.8 oz (101.5 kg)   SpO2 98%   Breastfeeding No   BMI 42.29 kg/m   Patient alert and oriented and in no cardiopulmonary distress.Pt in pain  HEENT: No facial asymmetry, EOMI,     Neck decreased ROM .  Chest: Clear to auscultation bilaterally.  CVS: S1, S2 no murmurs, no S3.Regular rate.  ABD: Soft non tender.   Ext: No edema  MS: Adequate ROM spine, shoulders, hips and knees.  Skin: Intact, no ulcerations or rash noted.  Psych: Good eye contact, normal affect. Memory intact not anxious or depressed appearing.  CNS: CN 2-12 intact, power,  normal throughout.no focal deficits noted.   Assessment & Plan  Neck pain Uncontrolled, progressive,  debilitating, needs MRI, previous MRI abn  Upper back pain Debilitating and interfering with normal activities needs MRI  Morbid obesity ([place])  Patient re-educated about  the importance of commitment to a  minimum of 150 minutes of exercise per week as able.  The importance of healthy food choices with portion control discussed, as well as eating regularly and within a 12 hour window most days. The need to choose "clean , green" food 50 to 75% of the time is discussed, as well as to make water the primary drink and set a goal of 64 ounces water daily.    Weight /BMI 06/19/2021 03/01/2021 02/20/2021  WEIGHT 223 lb 12.8 oz 260 lb 255 lb  HEIGHT '5\' 1"'$  '5\' 1"'$  '5\' 1"'$   BMI 42.29 kg/m2 49.13 kg/m2 48.18 kg/m2      Hypothyroidism Updated lab needed    Vitamin D deficiency On weekly supplement, Updated lab needed at/ before next visit.   Chronic headaches Uncontrolled, managed by Neurology

## 2021-06-23 NOTE — Assessment & Plan Note (Signed)
Debilitating and interfering with normal activities needs MRI

## 2021-06-23 NOTE — Assessment & Plan Note (Signed)
Uncontrolled, managed by Neurology

## 2021-06-23 NOTE — Assessment & Plan Note (Signed)
On weekly supplement, Updated lab needed at/ before next visit.

## 2021-06-23 NOTE — Assessment & Plan Note (Signed)
Updated lab needed.  

## 2021-06-23 NOTE — Assessment & Plan Note (Signed)
  Patient re-educated about  the importance of commitment to a  minimum of 150 minutes of exercise per week as able.  The importance of healthy food choices with portion control discussed, as well as eating regularly and within a 12 hour window most days. The need to choose "clean , green" food 50 to 75% of the time is discussed, as well as to make water the primary drink and set a goal of 64 ounces water daily.    Weight /BMI 06/19/2021 03/01/2021 02/20/2021  WEIGHT 223 lb 12.8 oz 260 lb 255 lb  HEIGHT '5\' 1"'$  '5\' 1"'$  '5\' 1"'$   BMI 42.29 kg/m2 49.13 kg/m2 48.18 kg/m2

## 2021-06-23 NOTE — Assessment & Plan Note (Signed)
Uncontrolled, progressive, debilitating, needs MRI, previous MRI abn

## 2021-07-01 ENCOUNTER — Other Ambulatory Visit: Payer: Self-pay

## 2021-07-01 ENCOUNTER — Ambulatory Visit (HOSPITAL_COMMUNITY)
Admission: RE | Admit: 2021-07-01 | Discharge: 2021-07-01 | Disposition: A | Discharge status: Home / Self Care | Payer: BC Managed Care – PPO | Source: Ambulatory Visit | Attending: Family Medicine | Admitting: Family Medicine

## 2021-07-01 DIAGNOSIS — M549 Dorsalgia, unspecified: Secondary | ICD-10-CM | POA: Diagnosis not present

## 2021-07-01 DIAGNOSIS — M542 Cervicalgia: Secondary | ICD-10-CM | POA: Insufficient documentation

## 2021-07-01 DIAGNOSIS — M47814 Spondylosis without myelopathy or radiculopathy, thoracic region: Secondary | ICD-10-CM | POA: Diagnosis not present

## 2021-07-02 ENCOUNTER — Encounter: Payer: Self-pay | Admitting: Family Medicine

## 2021-07-03 ENCOUNTER — Other Ambulatory Visit: Payer: Self-pay

## 2021-07-03 DIAGNOSIS — M542 Cervicalgia: Secondary | ICD-10-CM

## 2021-07-29 DIAGNOSIS — R87615 Unsatisfactory cytologic smear of cervix: Secondary | ICD-10-CM | POA: Diagnosis not present

## 2021-08-29 DIAGNOSIS — G43701 Chronic migraine without aura, not intractable, with status migrainosus: Secondary | ICD-10-CM | POA: Diagnosis not present

## 2021-09-02 DIAGNOSIS — G43711 Chronic migraine without aura, intractable, with status migrainosus: Secondary | ICD-10-CM | POA: Diagnosis not present

## 2021-09-02 DIAGNOSIS — G43719 Chronic migraine without aura, intractable, without status migrainosus: Secondary | ICD-10-CM | POA: Diagnosis not present

## 2021-09-16 ENCOUNTER — Ambulatory Visit (HOSPITAL_COMMUNITY): Payer: BC Managed Care – PPO | Attending: Family Medicine | Admitting: Physical Therapy

## 2021-09-16 ENCOUNTER — Other Ambulatory Visit: Payer: Self-pay

## 2021-09-16 ENCOUNTER — Encounter (HOSPITAL_COMMUNITY): Payer: Self-pay | Admitting: Physical Therapy

## 2021-09-16 DIAGNOSIS — M542 Cervicalgia: Secondary | ICD-10-CM | POA: Insufficient documentation

## 2021-09-16 DIAGNOSIS — R293 Abnormal posture: Secondary | ICD-10-CM | POA: Insufficient documentation

## 2021-09-16 DIAGNOSIS — M546 Pain in thoracic spine: Secondary | ICD-10-CM | POA: Insufficient documentation

## 2021-09-16 NOTE — Patient Instructions (Signed)
Access Code: [email or web address] URL: [email or web address] Date: 09/16/2021 Prepared by: Josue Hector  Exercises Seated Scapular Retraction - 3 x daily - 7 x weekly - 2 sets - 10 reps - 3 second hold Correct Seated Posture - 3 x daily - 7 x weekly - 2 sets - 10 reps - 5-10 second hold Seated Cervical Retraction - 3 x daily - 7 x weekly - 2 sets - 10 reps - 5-10 second hold

## 2021-09-16 NOTE — Therapy (Signed)
[place], [place], [address] Phone: [phone]   Fax:  [phone]  Physical Therapy Evaluation  Patient Details  Name: Alejandra Barr MRN: [id number] Date of Birth: 1986-10-02 Referring Provider (PT): [name] MD   Encounter Date: 09/16/2021   PT End of Session - 09/16/21 1114     Visit Number 1    Number of Visits 8    Date for PT Re-Evaluation 10/14/21    Authorization Type BCBS COMM PPO (30 VL)    Authorization - Visit Number 1    Authorization - Number of Visits 30    PT Start Time 0093    PT Stop Time 1115    PT Time Calculation (min) 35 min    Activity Tolerance Patient tolerated treatment well    Behavior During Therapy [facility] for tasks assessed/performed             Past Medical History:  Diagnosis Date   Asthma    Atrial flutter ([place])    Status post RFA by Dr. 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    Iron deficiency    Iron deficiency anemia 10/02/2010   Qualifier: Diagnosis of  By: Moshe Cipro MD, Margaret     Low vitamin B12 level 07/10/2013   Overview:  Last Assessment & Plan:  Being treated through hematology   Newborn product of in vitro fertilization (IVF) pregnancy    Obesity    PONV (postoperative nausea and vomiting)     Past Surgical History:  Procedure Laterality Date   Bilateral eustachian tube placement on 4 different occasions     CARDIAC ELECTROPHYSIOLOGY MAPPING AND ABLATION     CESAREAN SECTION N/A 05/26/2017   Procedure: CESAREAN SECTION;  Surgeon: Linda Hedges, DO;  Location: [place];  Service: Obstetrics;  Laterality: N/A;   CESAREAN SECTION N/A 03/01/2021   Procedure: Repeat CESAREAN SECTION;  Surgeon: Brien Few, MD;   Location: [place] LD ORS;  Service: Obstetrics;  Laterality: N/A;  EDD: 03/07/21   CESAREAN SECTION MULTI-GESTATIONAL N/A 02/10/2019   Procedure: Repeat CESAREAN SECTION MULTI-GESTATIONAL;  Surgeon: Brien Few, MD;  Location: [place] LD ORS;  Service: Obstetrics;  Laterality: N/A;  EDD: 03/15/19 Allergy: Penicillin, Morphine, Cipro   CHOLECYSTECTOMY  2003   COLONOSCOPY N/A 11/29/2013   Procedure: COLONOSCOPY;  Surgeon: Rogene Houston, MD;  [facility];  Service: Endoscopy;  Laterality: N/A;  120-rescheduled to 300 Ann notified pt   GASTRIC BYPASS  2006   TONSILLECTOMY  1990    There were no vitals filed for this visit.    Subjective Assessment - 09/16/21 1048     Subjective Patient presents to therapy with complaint of upper and lower spine pain. She reports history of bulging discs in her lower thoracic and lumbar spine. She has had issues with her back for several years. She receives injections for migraines. She has had recent imaging which shows mild spondylosis and some disc protrusions about lower thoracic spine. She manages pain with OTC medication.    Limitations Sitting;Lifting;House hold activities    Diagnostic tests MRI    Patient Stated Goals Be able to play with and care for children    Currently in Pain? Yes  Pain Score 7     Pain Location Back    Pain Orientation Posterior;Upper;Lower    Pain Descriptors / Indicators Sharp;Burning    Pain Type Chronic pain    Pain Onset More than a month ago    Pain Frequency Constant    Aggravating Factors  standing, sitting, prolonged positions    Pain Relieving Factors BC powder, ibuprofen    Effect of Pain on Daily Activities Limits                [facility] PT Assessment - 09/16/21 0001       Assessment   Medical Diagnosis chronic neck and upper back pain    Referring Provider (PT) [name] MD    Onset Date/Surgical Date --   Chronic   Prior Therapy Yes      Precautions   Precautions None       Restrictions   Weight Bearing Restrictions No      Balance Screen   Has the patient fallen in the past 6 months No      [place] residence      Prior Function   Level of Independence Independent      Cognition   Overall Cognitive Status Within Functional Limits for tasks assessed      Observation/Other Assessments   Focus on Therapeutic Outcomes (FOTO)  48% function      Posture/Postural Control   Posture/Postural Control Postural limitations    Postural Limitations Rounded Shoulders;Forward head      ROM / Strength   AROM / PROM / Strength AROM;Strength      AROM   Overall AROM Comments Mod restriction in bilateral shoulder elevation    AROM Assessment Site Cervical    Cervical Flexion 45    Cervical Extension 22    Cervical - Right Rotation 38    Cervical - Left Rotation 46      Strength   Strength Assessment Site Shoulder    Right/Left Shoulder Right;Left    Right Shoulder Flexion 4+/5    Right Shoulder ABduction 4+/5    Right Shoulder Internal Rotation 4+/5    Right Shoulder External Rotation 4/5    Left Shoulder Flexion 4+/5    Left Shoulder ABduction 4+/5    Left Shoulder Internal Rotation 4+/5    Left Shoulder External Rotation 4/5      Flexibility   Soft Tissue Assessment /Muscle Length --   Mod restriction in bilateral upper trap     Palpation   Palpation comment Mod/ Max tenderness to palpation about bilateral thoracic paraspinals                        Objective measurements completed on examination: See above findings.       [facility] Adult PT Treatment/Exercise - 09/16/21 0001       Exercises   Exercises Shoulder      Shoulder Exercises: Seated   Other Seated Exercises chin tuck, scap retraction, tall sitting x 5 each                     PT Education - 09/16/21 1051     Education Details on evaluation findings, POC and HEP    Person(s) Educated Patient    Methods  Explanation;Handout    Comprehension Verbalized understanding              PT Short Term Goals - 09/16/21 1118  PT SHORT TERM GOAL #1   Title Patient will be independent with initial HEP and self-management strategies to improve functional outcomes    Time 2    Period Weeks    Status New    Target Date 09/30/21               PT Long Term Goals - 09/16/21 1606       PT LONG TERM GOAL #1   Title Patient will improve FOTO score to predicted value to indicate improvement in functional outcomes    Time 4    Period Weeks    Status New    Target Date 10/14/21      PT LONG TERM GOAL #2   Title Patient will report at least 75% overall improvement in subjective complaint to indicate improvement in ability to perform ADLs.    Time 4    Period Weeks    Status New    Target Date 10/14/21      PT LONG TERM GOAL #3   Title Patient improve bilateral cervical rotation by at least 5 degrees in order to improve ability to scan environment for safety and while driving.    Time 4    Period Weeks    Status New    Target Date 10/14/21      PT LONG TERM GOAL #4   Title Patient will be independent with advance HEP and self-management strategies to improve functional outcomes    Time 4    Period Weeks    Status New    Target Date 10/14/21                    Plan - 09/16/21 1115     Clinical Impression Statement Patient is a 35 y.o. female who presents to physical therapy with complaint of neck and upper back pain. Patient demonstrates decreased strength, ROM restriction, reduced flexibility, increased tenderness to palpation and postural abnormalities which are likely contributing to symptoms of pain and are negatively impacting patient ability to perform ADLs. Patient will benefit from skilled physical therapy services to address these deficits to reduce pain and improve level of function with ADLs    Personal Factors and Comorbidities Time since onset of  injury/illness/exacerbation    Examination-Activity Limitations Sit;Caring for Others;Carry;Lift;Reach Overhead;Stand    Examination-Participation Restrictions Cleaning;Community Activity;Laundry;Yard Work;Other    Stability/Clinical Decision Making Stable/Uncomplicated    Clinical Decision Making Low    Rehab Potential Good    PT Frequency 2x / week    PT Duration 4 weeks    PT Treatment/Interventions ADLs/Self Care Home Management;Biofeedback;Cryotherapy;Electrical Stimulation;Functional mobility training;Stair training;Patient/family education;DME Instruction;Orthotic Fit/Training;Therapeutic activities;Therapeutic exercise;Balance training;Moist Heat;Iontophoresis 4mg /ml Dexamethasone;Traction;Manual techniques;Vasopneumatic Device;Taping;Splinting;Energy conservation;Dry needling;Joint Manipulations;Passive range of motion;Neuromuscular re-education;Compression bandaging;Scar mobilization;Visual/perceptual remediation/compensation;Spinal Manipulations;Ultrasound;Parrafin;Fluidtherapy;Contrast Bath;Gait training    PT Next Visit Plan Review HEP. Progress postural and core strength as tolerated. Cervcial and thoracic mobillity. Manual STM and joint mobilizations as needed.    PT Home Exercise Plan Eval: tall sitting, chin tuck, scap retraction    Consulted and Agree with Plan of Care Patient             Patient will benefit from skilled therapeutic intervention in order to improve the following deficits and impairments:  Impaired flexibility, Postural dysfunction, Decreased range of motion, Improper body mechanics, Decreased mobility, Pain, Impaired UE functional use, Increased fascial restricitons, Decreased strength, Decreased activity tolerance, Hypomobility  Visit Diagnosis: Cervicalgia  Pain in thoracic spine  Abnormal posture  Problem List Patient Active Problem List   Diagnosis Date Noted   Chronic headaches 06/23/2021   Neck pain 06/19/2021   Upper back pain  06/19/2021   Acute on chronic anemia 03/03/2021   Hypothyroidism 03/02/2021   GDM, class A2 03/02/2021   Previous cesarean section 03/01/2021   History of postpartum hemorrhage, delayed 03/01/2021   RLTCS 4/30 03/01/2021   Postpartum care following cesarean delivery 4/30 03/01/2021   Previous cesarean delivery affecting pregnancy 03/01/2021   Vitamin D deficiency 08/02/2012   Iron deficiency anemia 10/02/2010   Atrial flutter ([place]) 08/26/2009   Morbid obesity ([place]) 01/09/2008   Asthma, well controlled 01/09/2008   4:11 PM, 09/16/21 Josue Hector PT DPT  Physical Therapist with [facility]  [phone] [place], [place], [address] Phone: [phone]   Fax:  [phone]  Name: Alejandra Barr MRN: [id number] Date of Birth: June 27, 1986

## 2021-09-17 DIAGNOSIS — G43701 Chronic migraine without aura, not intractable, with status migrainosus: Secondary | ICD-10-CM | POA: Diagnosis not present

## 2021-09-17 DIAGNOSIS — G629 Polyneuropathy, unspecified: Secondary | ICD-10-CM | POA: Diagnosis not present

## 2021-09-17 DIAGNOSIS — E559 Vitamin D deficiency, unspecified: Secondary | ICD-10-CM | POA: Diagnosis not present

## 2021-09-17 DIAGNOSIS — M546 Pain in thoracic spine: Secondary | ICD-10-CM | POA: Diagnosis not present

## 2021-09-18 ENCOUNTER — Ambulatory Visit (HOSPITAL_COMMUNITY): Payer: BC Managed Care – PPO

## 2021-09-18 ENCOUNTER — Other Ambulatory Visit: Payer: Self-pay

## 2021-09-18 ENCOUNTER — Encounter (HOSPITAL_COMMUNITY): Payer: Self-pay

## 2021-09-18 DIAGNOSIS — R293 Abnormal posture: Secondary | ICD-10-CM | POA: Diagnosis not present

## 2021-09-18 DIAGNOSIS — M542 Cervicalgia: Secondary | ICD-10-CM

## 2021-09-18 DIAGNOSIS — M546 Pain in thoracic spine: Secondary | ICD-10-CM | POA: Diagnosis not present

## 2021-09-18 NOTE — Patient Instructions (Addendum)
Seated Horse Stance With Alignment Awareness    Sit in a comfortable position. Bring attention to the present moment, pausing to make subtle adjustments. Body is upright and naturally aligned. Notice breathing...then hands....and feet. Return focus to breathing. Practice for 5 minutes and increase as able.    Copyright  VHI. All rights reserved.   Lower Trunk Rotation Stretch    Keeping back flat and feet together, rotate knees to left side. Hold 10 seconds. Repeat 5 times per set. Do 2 sets per session.   http://orth.exer.us/123   Copyright  VHI. All rights reserved.   Bracing With Leg March (Hook-Lying)    With neutral spine, tighten pelvic floor and abdominals and hold. Alternating legs, lift foot ___ inches and return to floor.  Repeat ___ times. Do ___ times a day.   Copyright  VHI. All rights reserved.

## 2021-09-18 NOTE — Therapy (Signed)
[place], [place], [address] Phone: [phone]   Fax:  [phone]  Physical Therapy Treatment  Patient Details  Name: CAROLIE MCILRATH MRN: [id number] Date of Birth: Mar 13, 1986 Referring Provider (PT): [name] MD   Encounter Date: 09/18/2021   PT End of Session - 09/18/21 0844     Visit Number 2    Number of Visits 8    Date for PT Re-Evaluation 10/14/21    Authorization Type BCBS COMM PPO (30 VL)    Authorization - Visit Number 2    Authorization - Number of Visits 30    PT Start Time [phone]    PT Stop Time 0917    PT Time Calculation (min) 41 min    Activity Tolerance Patient tolerated treatment well;Patient limited by pain   thoracic pain   Behavior During Therapy [facility] for tasks assessed/performed             Past Medical History:  Diagnosis Date   Asthma    Atrial flutter ([place])    Status post RFA by Dr. 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    Iron deficiency    Iron deficiency anemia 10/02/2010   Qualifier: Diagnosis of  By: Moshe Cipro MD, Margaret     Low vitamin B12 level 07/10/2013   Overview:  Last Assessment & Plan:  Being treated through hematology   Newborn product of in vitro fertilization (IVF) pregnancy    Obesity    PONV (postoperative nausea and vomiting)     Past Surgical History:  Procedure Laterality Date   Bilateral eustachian tube placement on 4 different occasions     CARDIAC ELECTROPHYSIOLOGY MAPPING AND ABLATION     CESAREAN SECTION N/A 05/26/2017   Procedure: CESAREAN SECTION;  Surgeon: Linda Hedges, DO;  Location: [place];  Service: Obstetrics;  Laterality: N/A;   CESAREAN SECTION N/A 03/01/2021   Procedure: Repeat CESAREAN SECTION;   Surgeon: Brien Few, MD;  Location: [place] LD ORS;  Service: Obstetrics;  Laterality: N/A;  EDD: 03/07/21   CESAREAN SECTION MULTI-GESTATIONAL N/A 02/10/2019   Procedure: Repeat CESAREAN SECTION MULTI-GESTATIONAL;  Surgeon: Brien Few, MD;  Location: [place] LD ORS;  Service: Obstetrics;  Laterality: N/A;  EDD: 03/15/19 Allergy: Penicillin, Morphine, Cipro   CHOLECYSTECTOMY  2003   COLONOSCOPY N/A 11/29/2013   Procedure: COLONOSCOPY;  Surgeon: Rogene Houston, MD;  [facility];  Service: Endoscopy;  Laterality: N/A;  120-rescheduled to 300 Ann notified pt   GASTRIC BYPASS  2006   TONSILLECTOMY  1990    There were no vitals filed for this visit.   Subjective Assessment - 09/18/21 0840     Subjective Saw MD [place] yesterday and received cymbalta to begin for a week, also received gabapentin.  Current pain scale 5-6/10 thoracic pain L>Rt achey and burning symptoms.    Patient Stated Goals Be able to play with and care for children    Currently in Pain? Yes    Pain Score 6     Pain Location Back    Pain Orientation Upper;Mid;Left;Right    Pain Descriptors / Indicators Burning;Aching    Pain Type Chronic pain    Pain Onset More than a month ago  Pain Frequency Constant    Aggravating Factors  standing, sitting, prolonged position    Pain Relieving Factors BC powder, ibuprofen    Effect of Pain on Daily Activities Limits                               [facility] Adult PT Treatment/Exercise - 09/18/21 0001       Bed Mobility   Bed Mobility Sit to Sidelying Right    Sit to Sidelying Right Supervision/Verbal cueing   Reviewed log rolling     Posture/Postural Control   Posture/Postural Control Postural limitations    Postural Limitations Rounded Shoulders;Forward head      Exercises   Exercises Shoulder      Shoulder Exercises: Supine   Other Supine Exercises Bent knee raise wiht ab set 10x 5"    Other Supine Exercises Decompression 2-5 5x 5"       Shoulder Exercises: Seated   Other Seated Exercises chin tuck, scap retraction, tall sitting x 5 each    Other Seated Exercises SEated posture, assistance with lumbar support and stool for grounding.  3D cervical excursion pain free range      Shoulder Exercises: ROM/Strengthening   Other ROM/Strengthening Exercises LTR 5x 10"                     PT Education - 09/18/21 0849     Education Details Reviewed goals, educated importance of HEP compliance, pt able to recall and demonstrate appropriate, does report some discomfort with cervical retraction, reviewed mechanics.  Educated importance of seated posture and assistance with lumbar support and stool to assist iwth prolonged sitting posture.    Person(s) Educated Patient    Methods Explanation;Demonstration    Comprehension Verbalized understanding;Returned demonstration              PT Short Term Goals - 09/16/21 1118       PT SHORT TERM GOAL #1   Title Patient will be independent with initial HEP and self-management strategies to improve functional outcomes    Time 2    Period Weeks    Status New    Target Date 09/30/21               PT Long Term Goals - 09/16/21 1606       PT LONG TERM GOAL #1   Title Patient will improve FOTO score to predicted value to indicate improvement in functional outcomes    Time 4    Period Weeks    Status New    Target Date 10/14/21      PT LONG TERM GOAL #2   Title Patient will report at least 75% overall improvement in subjective complaint to indicate improvement in ability to perform ADLs.    Time 4    Period Weeks    Status New    Target Date 10/14/21      PT LONG TERM GOAL #3   Title Patient improve bilateral cervical rotation by at least 5 degrees in order to improve ability to scan environment for safety and while driving.    Time 4    Period Weeks    Status New    Target Date 10/14/21      PT LONG TERM GOAL #4   Title Patient will be independent with  advance HEP and self-management strategies to improve functional outcomes    Time 4    Period Weeks  Status New    Target Date 10/14/21                   Plan - 09/18/21 0909     Clinical Impression Statement Reviewed goals, educated importance of HEP compliance for maximal benefits, reviewed importance of sitting posture with additional lumbar and stool support to improve sitting for prolonged office job.  Session focus with postural and core strengthening as well as cervical mobility.  Pt reports improved tolerance with supine chin tucks, given additional decompression exercise printout to add to HEP.    Personal Factors and Comorbidities Time since onset of injury/illness/exacerbation    Examination-Activity Limitations Sit;Caring for Others;Carry;Lift;Reach Overhead;Stand    Examination-Participation Restrictions Cleaning;Community Activity;Laundry;Yard Work;Other    Stability/Clinical Decision Making Stable/Uncomplicated    Clinical Decision Making Low    Rehab Potential Good    PT Frequency 2x / week    PT Duration 4 weeks    PT Treatment/Interventions ADLs/Self Care Home Management;Biofeedback;Cryotherapy;Electrical Stimulation;Functional mobility training;Stair training;Patient/family education;DME Instruction;Orthotic Fit/Training;Therapeutic activities;Therapeutic exercise;Balance training;Moist Heat;Iontophoresis 4mg /ml Dexamethasone;Traction;Manual techniques;Vasopneumatic Device;Taping;Splinting;Energy conservation;Dry needling;Joint Manipulations;Passive range of motion;Neuromuscular re-education;Compression bandaging;Scar mobilization;Visual/perceptual remediation/compensation;Spinal Manipulations;Ultrasound;Parrafin;Fluidtherapy;Contrast Bath;Gait training    PT Next Visit Plan Progress postural and core strength as tolerated. Cervcial and thoracic mobillity. Manual STM and joint mobilizations as needed.    PT Home Exercise Plan Eval: tall sitting, chin tuck, scap  retraction; 11/17: decompression    Consulted and Agree with Plan of Care Patient             Patient will benefit from skilled therapeutic intervention in order to improve the following deficits and impairments:  Impaired flexibility, Postural dysfunction, Decreased range of motion, Improper body mechanics, Decreased mobility, Pain, Impaired UE functional use, Increased fascial restricitons, Decreased strength, Decreased activity tolerance, Hypomobility  Visit Diagnosis: Cervicalgia  Pain in thoracic spine  Abnormal posture     Problem List Patient Active Problem List   Diagnosis Date Noted   Chronic headaches 06/23/2021   Neck pain 06/19/2021   Upper back pain 06/19/2021   Acute on chronic anemia 03/03/2021   Hypothyroidism 03/02/2021   GDM, class A2 03/02/2021   Previous cesarean section 03/01/2021   History of postpartum hemorrhage, delayed 03/01/2021   RLTCS 4/30 03/01/2021   Postpartum care following cesarean delivery 4/30 03/01/2021   Previous cesarean delivery affecting pregnancy 03/01/2021   Vitamin D deficiency 08/02/2012   Iron deficiency anemia 10/02/2010   Atrial flutter ([place]) 08/26/2009   Morbid obesity ([place]) 01/09/2008   Asthma, well controlled 01/09/2008   Ihor Austin, LPTA/CLT; CBIS [phone]  Aldona Lento, PTA 09/18/2021, 11:49 AM  [place] [address] [place], [place], [address] Phone: [phone]   Fax:  [phone]  Name: VANESS JELINSKI MRN: [id number] Date of Birth: 16-Oct-1986

## 2021-09-24 ENCOUNTER — Ambulatory Visit (HOSPITAL_COMMUNITY): Payer: BC Managed Care – PPO | Admitting: Physical Therapy

## 2021-09-30 ENCOUNTER — Ambulatory Visit (HOSPITAL_COMMUNITY): Payer: BC Managed Care – PPO

## 2021-09-30 ENCOUNTER — Telehealth (HOSPITAL_COMMUNITY): Payer: Self-pay

## 2021-09-30 NOTE — Telephone Encounter (Signed)
No show, called and left message concerning missed apt today.  Reminded next apt date and time including return contact number if needs to reschedule in the future.  Ihor Austin, LPTA/CLT; Delana Meyer [phone]

## 2021-10-03 ENCOUNTER — Other Ambulatory Visit: Payer: Self-pay

## 2021-10-03 ENCOUNTER — Ambulatory Visit (HOSPITAL_COMMUNITY): Payer: BC Managed Care – PPO | Attending: Family Medicine | Admitting: Physical Therapy

## 2021-10-03 DIAGNOSIS — R293 Abnormal posture: Secondary | ICD-10-CM | POA: Diagnosis not present

## 2021-10-03 DIAGNOSIS — M542 Cervicalgia: Secondary | ICD-10-CM | POA: Diagnosis not present

## 2021-10-03 DIAGNOSIS — M546 Pain in thoracic spine: Secondary | ICD-10-CM | POA: Diagnosis not present

## 2021-10-03 NOTE — Therapy (Signed)
[place], [place], [address] Phone: [phone]   Fax:  [phone]  Physical Therapy Treatment  Patient Details  Name: Alejandra Barr MRN: [id number] Date of Birth: [birth year] Referring Provider (PT): [name] MD   Encounter Date: 10/03/2021   PT End of Session - 10/03/21 1543     Visit Number 3    Number of Visits 8    Date for PT Re-Evaluation 10/14/21    Authorization Type BCBS COMM PPO (30 VL)    Authorization - Visit Number 3    Authorization - Number of Visits 30    Progress Note Due on Visit 8    PT Start Time 9833    PT Stop Time 1615    PT Time Calculation (min) 38 min    Activity Tolerance Patient tolerated treatment well;Patient limited by pain   thoracic pain   Behavior During Therapy [facility] for tasks assessed/performed             Past Medical History:  Diagnosis Date   Asthma    Atrial flutter ([place])    Status post RFA by Dr. 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    Iron deficiency    Iron deficiency anemia 10/02/2010   Qualifier: Diagnosis of  By: Moshe Cipro MD, Margaret     Low vitamin B12 level 07/10/2013   Overview:  Last Assessment & Plan:  Being treated through hematology   Newborn product of in vitro fertilization (IVF) pregnancy    Obesity    PONV (postoperative nausea and vomiting)     Past Surgical History:  Procedure Laterality Date   Bilateral eustachian tube placement on 4 different occasions     CARDIAC ELECTROPHYSIOLOGY MAPPING AND ABLATION     CESAREAN SECTION N/A 05/26/2017   Procedure: CESAREAN SECTION;  Surgeon: Linda Hedges, DO;  Location: [facility];  Service: Obstetrics;  Laterality: N/A;   CESAREAN SECTION N/A 03/01/2021    Procedure: Repeat CESAREAN SECTION;  Surgeon: Brien Few, MD;  Location: [place] LD [facility]S;  Service: Obstetrics;  Laterality: N/A;  EDD: 03/07/21   CESAREAN SECTION MULTI-GESTATIONAL N/A 02/10/2019   Procedure: Repeat CESAREAN SECTION MULTI-GESTATIONAL;  Surgeon: Brien Few, MD;  Location: [place] [facility];  Service: Obstetrics;  Laterality: N/A;  EDD: 03/15/19 Allergy: Penicillin, Morphine, Cipro   CHOLECYSTECTOMY  2003   COLONOSCOPY N/A 11/29/2013   Procedure: COLONOSCOPY;  Surgeon: Rogene Houston, MD;  [facility];  Service: Endoscopy;  Laterality: N/A;  120-rescheduled to 300 Ann notified pt   GASTRIC BYPASS  2006   TONSILLECTOMY  1990    There were no vitals filed for this visit.   Subjective Assessment - 10/03/21 1539     Subjective Pt states that her back has been at about a 5 which is better as she was a 7-8; Pt states that her grandmother died 02/17/2023 so she has not done any of the exercises.    Limitations Sitting;Lifting;House hold activities    Diagnostic tests MRI    Patient Stated Goals Be able to play with and care for children    Currently in Pain? Yes    Pain Score 5     Pain Location Back  Pain Orientation Mid;Lower    Pain Descriptors / Indicators Aching    Pain Type Chronic pain    Pain Onset More than a month ago    Pain Frequency Constant    Aggravating Factors  prolong standing    Pain Relieving Factors BC Powder                               [facility] Adult PT Treatment/Exercise - 10/03/21 0001       Exercises   Exercises Neck;Shoulder      Neck Exercises: Seated   W Back 10 reps    Shoulder Rolls Backwards;10 reps    Other Seated Exercise scapular retraction x 10    Other Seated Exercise cervical and thoracic excursions;  sitting mad cat old horse      Neck Exercises: Supine   Other Supine Exercise t band decompression exercises with yellow t band      Shoulder Exercises: Supine   Other Supine Exercises Decompression 2-5  5x 5"      Shoulder Exercises: ROM/Strengthening   Other ROM/Strengthening Exercises LTR 5x 10"    Other ROM/Strengthening Exercises ab isometric                       PT Short Term Goals - 10/03/21 1600       PT SHORT TERM GOAL #1   Title Patient will be independent with initial HEP and self-management strategies to improve functional outcomes    Time 2    Period Weeks    Status On-going    Target Date 09/30/21               PT Long Term Goals - 10/03/21 1601       PT LONG TERM GOAL #1   Title Patient will improve FOTO score to predicted value to indicate improvement in functional outcomes    Time 4    Period Weeks    Status On-going      PT LONG TERM GOAL #2   Title Patient will report at least 75% overall improvement in subjective complaint to indicate improvement in ability to perform ADLs.    Time 4    Period Weeks    Status On-going      PT LONG TERM GOAL #3   Title Patient improve bilateral cervical rotation by at least 5 degrees in order to improve ability to scan environment for safety and while driving.    Time 4    Period Weeks    Status On-going      PT LONG TERM GOAL #4   Title Patient will be independent with advance HEP and self-management strategies to improve functional outcomes    Time 4    Period Weeks    Status On-going                   Plan - 10/03/21 1545     Clinical Impression Statement Pt unable to complete exercises due to death in the family.  Therapist progressed exercises and updated HEP    Personal Factors and Comorbidities Time since onset of injury/illness/exacerbation    Examination-Activity Limitations Sit;Caring for Others;Carry;Lift;Reach Overhead;Stand    Examination-Participation Restrictions Cleaning;Community Activity;Laundry;Yard Work;Other    Stability/Clinical Decision Making Stable/Uncomplicated    Rehab Potential Good    PT Frequency 2x / week    PT Duration 4 weeks    PT  Treatment/Interventions  ADLs/Self Care Home Management;Biofeedback;Cryotherapy;Electrical Stimulation;Functional mobility training;Stair training;Patient/family education;DME Instruction;Orthotic Fit/Training;Therapeutic activities;Therapeutic exercise;Balance training;Moist Heat;Iontophoresis 4mg /ml Dexamethasone;Traction;Manual techniques;Vasopneumatic Device;Taping;Splinting;Energy conservation;Dry needling;Joint Manipulations;Passive range of motion;Neuromuscular re-education;Compression bandaging;Scar mobilization;Visual/perceptual remediation/compensation;Spinal Manipulations;Ultrasound;Parrafin;Fluidtherapy;Contrast Bath;Gait training    PT Next Visit Plan Progress postural and core strength as tolerated. Cervcial and thoracic mobillity. Manual STM and joint mobilizations as needed.    PT Home Exercise Plan Eval: tall sitting, chin tuck, scap retraction; 11/17: decompression; 12/2 cervical and thoracic excursions, T band decompression exercises    Consulted and Agree with Plan of Care Patient             Patient will benefit from skilled therapeutic intervention in order to improve the following deficits and impairments:  Impaired flexibility, Postural dysfunction, Decreased range of motion, Improper body mechanics, Decreased mobility, Pain, Impaired UE functional use, Increased fascial restricitons, Decreased strength, Decreased activity tolerance, Hypomobility  Visit Diagnosis: Cervicalgia  Pain in thoracic spine  Abnormal posture     Problem List Patient Active Problem List   Diagnosis Date Noted   Chronic headaches 06/23/2021   Neck pain 06/19/2021   Upper back pain 06/19/2021   Acute on chronic anemia 03/03/2021   Hypothyroidism 03/02/2021   GDM, class A2 03/02/2021   Previous cesarean section 03/01/2021   History of postpartum hemorrhage, delayed 03/01/2021   RLTCS 4/30 03/01/2021   Postpartum care following cesarean delivery 4/30 03/01/2021   Previous cesarean  delivery affecting pregnancy 03/01/2021   Vitamin D deficiency 08/02/2012   Iron deficiency anemia 10/02/2010   Atrial flutter ([place]) 08/26/2009   Morbid obesity ([place]) 01/09/2008   Asthma, well controlled 01/09/2008   Rayetta Humphrey, PT CLT [phone]  10/03/2021, 4:15 PM  [facility] [address] [place], [place], [address] Phone: [phone]   Fax:  [phone]  Name: Alejandra Barr MRN: [id number] Date of Birth: [birth year]

## 2021-10-07 ENCOUNTER — Telehealth (HOSPITAL_COMMUNITY): Payer: Self-pay | Admitting: Physical Therapy

## 2021-10-07 ENCOUNTER — Ambulatory Visit (HOSPITAL_COMMUNITY): Payer: BC Managed Care – PPO | Admitting: Physical Therapy

## 2021-10-07 NOTE — Telephone Encounter (Signed)
Called patient and left VM about missed appointment today and reminder of upcoming visit 10/09/21.   4:37 PM, 10/07/21 Josue Hector PT DPT  Physical Therapist with [facility]  [phone]

## 2021-10-09 ENCOUNTER — Ambulatory Visit (HOSPITAL_COMMUNITY): Payer: BC Managed Care – PPO | Admitting: Physical Therapy

## 2021-10-13 ENCOUNTER — Other Ambulatory Visit: Payer: Self-pay

## 2021-10-13 ENCOUNTER — Ambulatory Visit (HOSPITAL_COMMUNITY): Payer: BC Managed Care – PPO

## 2021-10-13 ENCOUNTER — Encounter (HOSPITAL_COMMUNITY): Payer: Self-pay

## 2021-10-13 DIAGNOSIS — M542 Cervicalgia: Secondary | ICD-10-CM | POA: Diagnosis not present

## 2021-10-13 DIAGNOSIS — R293 Abnormal posture: Secondary | ICD-10-CM | POA: Diagnosis not present

## 2021-10-13 DIAGNOSIS — M546 Pain in thoracic spine: Secondary | ICD-10-CM

## 2021-10-13 NOTE — Therapy (Signed)
[place], [place], [address] Phone: [phone]   Fax:  [phone]  Physical Therapy Treatment  Patient Details  Name: Alejandra Barr MRN: [id number] Date of Birth: 1986/04/19 Referring Provider (PT): [name] MD   Encounter Date: 10/13/2021   PT End of Session - 10/13/21 1617     Visit Number 4    Number of Visits 8    Date for PT Re-Evaluation 10/14/21    Authorization Type BCBS COMM PPO (30 VL)    Authorization - Visit Number 4    Authorization - Number of Visits 30    Progress Note Due on Visit 8    PT Start Time 3818    PT Stop Time 1646    PT Time Calculation (min) 38 min    Activity Tolerance Patient tolerated treatment well;Patient limited by pain;No increased pain    Behavior During Therapy [facility] for tasks assessed/performed             Past Medical History:  Diagnosis Date   Asthma    Atrial flutter ([place])    Status post RFA by Dr. 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    Iron deficiency    Iron deficiency anemia 10/02/2010   Qualifier: Diagnosis of  By: Moshe Cipro MD, Margaret     Low vitamin B12 level 07/10/2013   Overview:  Last Assessment & Plan:  Being treated through hematology   Newborn product of in vitro fertilization (IVF) pregnancy    Obesity    PONV (postoperative nausea and vomiting)     Past Surgical History:  Procedure Laterality Date   Bilateral eustachian tube placement on 4 different occasions     CARDIAC ELECTROPHYSIOLOGY MAPPING AND ABLATION     CESAREAN SECTION N/A 05/26/2017   Procedure: CESAREAN SECTION;  Surgeon: Linda Hedges, DO;  Location: [facility];  Service: Obstetrics;  Laterality: N/A;   CESAREAN SECTION N/A 03/01/2021    Procedure: Repeat CESAREAN SECTION;  Surgeon: Brien Few, MD;  Location: [place] [facility];  Service: Obstetrics;  Laterality: N/A;  EDD: 03/07/21   CESAREAN SECTION MULTI-GESTATIONAL N/A 02/10/2019   Procedure: Repeat CESAREAN SECTION MULTI-GESTATIONAL;  Surgeon: Brien Few, MD;  Location: [place] LD [facility];  Service: Obstetrics;  Laterality: N/A;  EDD: 03/15/19 Allergy: Penicillin, Morphine, Cipro   CHOLECYSTECTOMY  2003   COLONOSCOPY N/A 11/29/2013   Procedure: COLONOSCOPY;  Surgeon: Rogene Houston, MD;  [facility];  Service: Endoscopy;  Laterality: N/A;  120-rescheduled to 300 Ann notified pt   GASTRIC BYPASS  2006   TONSILLECTOMY  1990    There were no vitals filed for this visit.   Subjective Assessment - 10/13/21 1614     Subjective Pt stated she has decreased exercises and some motivation following death of grandmother, has returned to some of her exercises at home.  Reports she can tell improvements with seated posture during work as works 10 hrs.    Patient Stated Goals Be able to play with and care for children    Currently in Pain? Yes    Pain Score 5     Pain Location Back    Pain Descriptors / Indicators Aching;Sore    Pain Type Chronic pain  Pain Onset More than a month ago    Pain Frequency Constant    Aggravating Factors  prolonged standing    Pain Relieving Factors BC Powder    Effect of Pain on Daily Activities Limits                               [facility] Adult PT Treatment/Exercise - 10/13/21 0001       Posture/Postural Control   Posture/Postural Control Postural limitations    Postural Limitations Rounded Shoulders;Forward head      Neck Exercises: Standing   Other Standing Exercises Core engangement for proper posture    Other Standing Exercises paloff NBOS RTB 2x 10      Neck Exercises: Seated   W Back 10 reps    Other Seated Exercise row RTB 10x 5"    Other Seated Exercise cervical and thoracic excursions;  sitting mad cat old  horse      Neck Exercises: Supine   Other Supine Exercise bridge 10x5    Other Supine Exercise t band decompression exercises with red t band      Shoulder Exercises: Supine   Other Supine Exercises Bridge    Other Supine Exercises Isometric ab set                     PT Education - 10/13/21 1623     Education Details Educated importance of core engangement for postural strengthening to reduce pain standing prolonged periods of time    Person(s) Educated Patient    Methods Explanation;Demonstration    Comprehension Verbalized understanding;Returned demonstration              PT Short Term Goals - 10/03/21 1600       PT SHORT TERM GOAL #1   Title Patient will be independent with initial HEP and self-management strategies to improve functional outcomes    Time 2    Period Weeks    Status On-going    Target Date 09/30/21               PT Long Term Goals - 10/03/21 1601       PT LONG TERM GOAL #1   Title Patient will improve FOTO score to predicted value to indicate improvement in functional outcomes    Time 4    Period Weeks    Status On-going      PT LONG TERM GOAL #2   Title Patient will report at least 75% overall improvement in subjective complaint to indicate improvement in ability to perform ADLs.    Time 4    Period Weeks    Status On-going      PT LONG TERM GOAL #3   Title Patient improve bilateral cervical rotation by at least 5 degrees in order to improve ability to scan environment for safety and while driving.    Time 4    Period Weeks    Status On-going      PT LONG TERM GOAL #4   Title Patient will be independent with advance HEP and self-management strategies to improve functional outcomes    Time 4    Period Weeks    Status On-going                   Plan - 10/13/21 1706     Clinical Impression Statement Session focus with postural, core and proximal strengthening.  Pt with good ab  set though unable to hold for  more than 2" due to weakness, encouraged to add isometric to HEP with goal of 5-10" holds for strengthening.  Added bridges, resistance with rows and standing postural/core strengthening exercises.  No reports of increased pain through session.    Personal Factors and Comorbidities Time since onset of injury/illness/exacerbation    Examination-Activity Limitations Sit;Caring for Others;Carry;Lift;Reach Overhead;Stand    Examination-Participation Restrictions Cleaning;Community Activity;Laundry;Yard Work;Other    Stability/Clinical Decision Making Stable/Uncomplicated    Clinical Decision Making Low    Rehab Potential Good    PT Frequency 2x / week    PT Duration 4 weeks    PT Treatment/Interventions ADLs/Self Care Home Management;Biofeedback;Cryotherapy;Electrical Stimulation;Functional mobility training;Stair training;Patient/family education;DME Instruction;Orthotic Fit/Training;Therapeutic activities;Therapeutic exercise;Balance training;Moist Heat;Iontophoresis 4mg /ml Dexamethasone;Traction;Manual techniques;Vasopneumatic Device;Taping;Splinting;Energy conservation;Dry needling;Joint Manipulations;Passive range of motion;Neuromuscular re-education;Compression bandaging;Scar mobilization;Visual/perceptual remediation/compensation;Spinal Manipulations;Ultrasound;Parrafin;Fluidtherapy;Contrast Bath;Gait training    PT Next Visit Plan Review goals next session.  Progress postural and core strength as tolerated. Cervcial and thoracic mobillity. Manual STM and joint mobilizations as needed.    PT Home Exercise Plan Eval: tall sitting, chin tuck, scap retraction; 11/17: decompression; 12/2 cervical and thoracic excursions, T band decompression exercises; 12/12: paloff with RTB    Consulted and Agree with Plan of Care Patient             Patient will benefit from skilled therapeutic intervention in order to improve the following deficits and impairments:  Impaired flexibility, Postural dysfunction,  Decreased range of motion, Improper body mechanics, Decreased mobility, Pain, Impaired UE functional use, Increased fascial restricitons, Decreased strength, Decreased activity tolerance, Hypomobility  Visit Diagnosis: Cervicalgia  Pain in thoracic spine  Abnormal posture     Problem List Patient Active Problem List   Diagnosis Date Noted   Chronic headaches 06/23/2021   Neck pain 06/19/2021   Upper back pain 06/19/2021   Acute on chronic anemia 03/03/2021   Hypothyroidism 03/02/2021   GDM, class A2 03/02/2021   Previous cesarean section 03/01/2021   History of postpartum hemorrhage, delayed 03/01/2021   RLTCS 4/30 03/01/2021   Postpartum care following cesarean delivery 4/30 03/01/2021   Previous cesarean delivery affecting pregnancy 03/01/2021   Vitamin D deficiency 08/02/2012   Iron deficiency anemia 10/02/2010   Atrial flutter ([place]) 08/26/2009   Morbid obesity ([place]) 01/09/2008   Asthma, well controlled 01/09/2008   Ihor Austin, LPTA/CLT; CBIS [phone]  Aldona Lento, PTA 10/13/2021, 5:42 PM  [facility] [address] [place], [place], [address] Phone: [phone]   Fax:  [phone]  Name: SARALYN WILLISON MRN: [id number] Date of Birth: Oct 19, 1986

## 2021-10-16 ENCOUNTER — Ambulatory Visit (HOSPITAL_COMMUNITY): Payer: BC Managed Care – PPO | Admitting: Physical Therapy

## 2021-10-20 ENCOUNTER — Encounter: Payer: Self-pay | Admitting: Nurse Practitioner

## 2021-10-20 ENCOUNTER — Ambulatory Visit (INDEPENDENT_AMBULATORY_CARE_PROVIDER_SITE_OTHER): Payer: BC Managed Care – PPO | Admitting: Nurse Practitioner

## 2021-10-20 ENCOUNTER — Other Ambulatory Visit: Payer: Self-pay

## 2021-10-20 ENCOUNTER — Encounter (HOSPITAL_COMMUNITY): Payer: BC Managed Care – PPO

## 2021-10-20 DIAGNOSIS — U071 COVID-19: Secondary | ICD-10-CM | POA: Diagnosis not present

## 2021-10-20 MED ORDER — NIRMATRELVIR/RITONAVIR (PAXLOVID)TABLET: 3.0000 | ORAL_TABLET | Freq: Two times a day (BID) | ORAL | 0 refills | Status: AC

## 2021-10-20 MED ORDER — ALBUTEROL SULFATE HFA 108 (90 BASE) MCG/ACT IN AERS: 2.0000 | INHALATION_SPRAY | Freq: Four times a day (QID) | RESPIRATORY_TRACT | 0 refills | Status: AC | PRN

## 2021-10-20 MED ORDER — BENZONATATE 100 MG PO CAPS: 100.0000 mg | ORAL_CAPSULE | Freq: Two times a day (BID) | ORAL | 0 refills | Status: DC | PRN

## 2021-10-20 NOTE — Assessment & Plan Note (Signed)
COVID 19 infection: paxlovid 20x150mg  x100mg  tablets for 5 five days. Pt told not to take Ubrelvy tablet while taking paxlovid, she verbalized understanding.  Tessalon 100 mg BID PRN for cough Tylenol as needed for HA , fever.  Stay on isolation and wear mask when around other people for 5 days.

## 2021-10-20 NOTE — Progress Notes (Signed)
Virtual Visit via Telephone Note  I connected with Alejandra Barr @ on 10/20/21 at 1:56pm by telephone and verified that I am speaking with the correct person using two identifiers. I spent 7 minutes talking to the pt and reviewing her chart  Location: Patient: home Provider: office   I discussed the limitations, risks, security and privacy concerns of performing an evaluation and management service by telephone and the availability of in person appointments. I also discussed with the patient that there may be a patient responsible charge related to this service. The patient expressed understanding and agreed to proceed.   History of Present Illness: Pt c/o Cough, chest congestion,chest tightness,body aches that started yesterday.Pt tested positive for COVID today 10/20/21, tested at home today.  Pt denies fever, SOB, wheezing , bloody sputum,pt's  mother had recently had COVID . She has taken mucin ex, tylenol as needed.     Observations/Objective:   Assessment and Plan: COVID 19 infection: paxlovid 20x150mg  x100mg  tablets for 5 five days. Pt told not to take Ubrelvy tablet while taking paxlovid, she verbalized understanding.  Tessalon 100 mg BID PRN for cough Tylenol as needed for HA , fever.  Stay on isolation and wear mask when around other people for 5 days  Follow Up Instructions:    I discussed the assessment and treatment plan with the patient. The patient was provided an opportunity to ask questions and all were answered. The patient agreed with the plan and demonstrated an understanding of the instructions.   The patient was advised to call back or seek an in-person evaluation if the symptoms worsen or if the condition fails to improve as anticipated.

## 2021-10-23 ENCOUNTER — Ambulatory Visit: Payer: BC Managed Care – PPO | Admitting: Family Medicine

## 2021-10-27 ENCOUNTER — Encounter: Payer: Self-pay | Admitting: Family Medicine

## 2021-10-27 ENCOUNTER — Encounter: Payer: Self-pay | Admitting: Nurse Practitioner

## 2021-10-28 ENCOUNTER — Other Ambulatory Visit: Payer: Self-pay | Admitting: Internal Medicine

## 2021-10-28 DIAGNOSIS — U071 COVID-19: Secondary | ICD-10-CM

## 2021-10-28 MED ORDER — DEXAMETHASONE 6 MG PO TABS
6.0000 mg | ORAL_TABLET | Freq: Every day | ORAL | 0 refills | Status: DC
Start: 2021-10-28 — End: 2022-03-05

## 2021-10-28 MED ORDER — PROMETHAZINE-DM 6.25-15 MG/5ML PO SYRP: 5.0000 mL | ORAL_SOLUTION | Freq: Four times a day (QID) | ORAL | 0 refills | Status: DC | PRN

## 2021-11-05 ENCOUNTER — Ambulatory Visit (HOSPITAL_COMMUNITY): Payer: BC Managed Care – PPO | Admitting: Physical Therapy

## 2021-11-05 ENCOUNTER — Telehealth (HOSPITAL_COMMUNITY): Payer: Self-pay | Admitting: Physical Therapy

## 2021-11-05 ENCOUNTER — Ambulatory Visit (INDEPENDENT_AMBULATORY_CARE_PROVIDER_SITE_OTHER): Payer: BC Managed Care – PPO | Admitting: Family Medicine

## 2021-11-05 ENCOUNTER — Other Ambulatory Visit: Payer: Self-pay

## 2021-11-05 ENCOUNTER — Encounter: Payer: Self-pay | Admitting: Family Medicine

## 2021-11-05 DIAGNOSIS — R051 Acute cough: Secondary | ICD-10-CM | POA: Diagnosis not present

## 2021-11-05 DIAGNOSIS — J0100 Acute maxillary sinusitis, unspecified: Secondary | ICD-10-CM | POA: Diagnosis not present

## 2021-11-05 DIAGNOSIS — R0781 Pleurodynia: Secondary | ICD-10-CM | POA: Diagnosis not present

## 2021-11-05 MED ORDER — BENZONATATE 100 MG PO CAPS: 100.0000 mg | ORAL_CAPSULE | Freq: Two times a day (BID) | ORAL | 0 refills | Status: DC | PRN

## 2021-11-05 MED ORDER — PROMETHAZINE-DM 6.25-15 MG/5ML PO SYRP: ORAL_SOLUTION | ORAL | 0 refills | Status: DC

## 2021-11-05 MED ORDER — SULFAMETHOXAZOLE-TRIMETHOPRIM 800-160 MG PO TABS: 1.0000 | ORAL_TABLET | Freq: Two times a day (BID) | ORAL | 0 refills | Status: DC

## 2021-11-05 NOTE — Patient Instructions (Signed)
F/U in 4 weeks, flu and pneumonia vaccines at that visit,   call if you need me before   Come this pm to office, need to submit sputum for c/S  Swab for covid and influenza this pm in office parking lot, call when you arrive so nurse will come and swab you   Septra , tessalon perles and phenergan dm are prescribed, use AS directed  If tests show no active infection, and you remain very symptomatic, you will be referred to Pulmonary for eval and follow up of " long haul" covid   Thanks for choosing [facility], we consider it a privelige to serve you.

## 2021-11-05 NOTE — Progress Notes (Signed)
Virtual Visit via Telephone Note  I connected with [name] on 11/05/21 at 11:40 AM EST by telephone and verified that I am speaking with the correct person using two identifiers.  Location: Patient: home Provider: office   I discussed the limitations, risks, security and privacy concerns of performing an evaluation and management service by telephone and the availability of in person appointments. I also discussed with the patient that there may be a patient responsible charge related to this service. The patient expressed understanding and agreed to proceed.   History of Present Illness: Diagnosed with covid 10/20/2021 still having  cough green sputum, and pain with  breathing more on right than left , feels horrid, 1 day more of dexamethasone, still testing positive and running low grade temp Right maxillary pressure and nasal congestion c/o green nasal drainage  Observations/Objective: LMP 11/04/2021  Good communication with no confusion and intact memory. Alert and oriented x 3 No signs of respiratory distress during speech   Assessment and Plan:  Acute non-recurrent sinusitis 3 week history , persistent and worsening symptoms, spceimen for c/s and sinus X ray. Septra prescribed  Acute cough covid positive in mid Dec reporting pleuritic chest pain, excessive cough and green sputum, needs CXR, sputum c/s , septra, tessalon perles and phenergan DM prescribed  Follow Up Instructions:    I discussed the assessment and treatment plan with the patient. The patient was provided an opportunity to ask questions and all were answered. The patient agreed with the plan and demonstrated an understanding of the instructions.   The patient was advised to call back or seek an in-person evaluation if the symptoms worsen or if the condition fails to improve as anticipated.  I provided 14 minutes of non-face-to-face time during this encounter.   [name], MD

## 2021-11-05 NOTE — Telephone Encounter (Signed)
Called patient about missed appt today. Today is last scheduled visit. Left VM to inform patient that we will DC episode and to call office with any further issues or concerns.    2:14 PM, 11/05/21 Josue Hector PT DPT  Physical Therapist with [facility]  [phone]

## 2021-11-06 ENCOUNTER — Ambulatory Visit: Payer: BC Managed Care – PPO

## 2021-11-06 ENCOUNTER — Encounter: Payer: Self-pay | Admitting: Family Medicine

## 2021-11-06 ENCOUNTER — Other Ambulatory Visit: Payer: Self-pay

## 2021-11-06 ENCOUNTER — Ambulatory Visit (HOSPITAL_COMMUNITY)
Admission: RE | Admit: 2021-11-06 | Discharge: 2021-11-06 | Disposition: A | Discharge status: Home / Self Care | Payer: BC Managed Care – PPO | Source: Ambulatory Visit | Attending: Family Medicine | Admitting: Family Medicine

## 2021-11-06 DIAGNOSIS — R0781 Pleurodynia: Secondary | ICD-10-CM | POA: Diagnosis not present

## 2021-11-06 DIAGNOSIS — R051 Acute cough: Secondary | ICD-10-CM | POA: Insufficient documentation

## 2021-11-06 DIAGNOSIS — J019 Acute sinusitis, unspecified: Secondary | ICD-10-CM | POA: Insufficient documentation

## 2021-11-06 DIAGNOSIS — R059 Cough, unspecified: Secondary | ICD-10-CM | POA: Diagnosis not present

## 2021-11-06 DIAGNOSIS — J0100 Acute maxillary sinusitis, unspecified: Secondary | ICD-10-CM | POA: Diagnosis not present

## 2021-11-06 LAB — POCT INFLUENZA A/B
Influenza A, POC: NEGATIVE
Influenza B, POC: NEGATIVE

## 2021-11-06 NOTE — Assessment & Plan Note (Signed)
covid positive in mid Dec reporting pleuritic chest pain, excessive cough and green sputum, needs CXR, sputum c/s , septra, tessalon perles and phenergan DM prescribed

## 2021-11-06 NOTE — Assessment & Plan Note (Signed)
3 week history , persistent and worsening symptoms, spceimen for c/s and sinus X ray. Septra prescribed

## 2021-11-07 DIAGNOSIS — R051 Acute cough: Secondary | ICD-10-CM | POA: Diagnosis not present

## 2021-11-08 LAB — SARS-COV-2, NAA 2 DAY TAT

## 2021-11-08 LAB — NOVEL CORONAVIRUS, NAA: SARS-CoV-2, NAA: NOT DETECTED

## 2021-11-13 ENCOUNTER — Other Ambulatory Visit: Payer: Self-pay | Admitting: Family Medicine

## 2021-11-13 ENCOUNTER — Encounter: Payer: Self-pay | Admitting: Family Medicine

## 2021-11-13 LAB — GRAM STAIN W/SPUTUM CULT RFLX: Epithelial Cells: NONE SEEN

## 2021-11-13 LAB — SPUTUM CULTURE

## 2021-11-13 MED ORDER — SULFAMETHOXAZOLE-TRIMETHOPRIM 800-160 MG PO TABS: 1.0000 | ORAL_TABLET | Freq: Two times a day (BID) | ORAL | 0 refills | Status: DC

## 2021-12-04 ENCOUNTER — Inpatient Hospital Stay (HOSPITAL_COMMUNITY): Payer: BC Managed Care – PPO

## 2021-12-10 ENCOUNTER — Inpatient Hospital Stay (HOSPITAL_COMMUNITY): Payer: BC Managed Care – PPO | Attending: Hematology

## 2021-12-10 ENCOUNTER — Other Ambulatory Visit: Payer: Self-pay

## 2021-12-10 ENCOUNTER — Ambulatory Visit: Payer: BC Managed Care – PPO | Admitting: Family Medicine

## 2021-12-10 DIAGNOSIS — E559 Vitamin D deficiency, unspecified: Secondary | ICD-10-CM | POA: Diagnosis not present

## 2021-12-10 DIAGNOSIS — E538 Deficiency of other specified B group vitamins: Secondary | ICD-10-CM | POA: Diagnosis not present

## 2021-12-10 DIAGNOSIS — D500 Iron deficiency anemia secondary to blood loss (chronic): Secondary | ICD-10-CM | POA: Diagnosis not present

## 2021-12-10 DIAGNOSIS — D508 Other iron deficiency anemias: Secondary | ICD-10-CM

## 2021-12-10 DIAGNOSIS — N920 Excessive and frequent menstruation with regular cycle: Secondary | ICD-10-CM | POA: Diagnosis not present

## 2021-12-10 LAB — CBC
HCT: 38.5 % (ref 36.0–46.0)
Hemoglobin: 11.9 g/dL — ABNORMAL LOW (ref 12.0–15.0)
MCH: 27.4 pg (ref 26.0–34.0)
MCHC: 30.9 g/dL (ref 30.0–36.0)
MCV: 88.7 fL (ref 80.0–100.0)
Platelets: 349 10*3/uL (ref 150–400)
RBC: 4.34 MIL/uL (ref 3.87–5.11)
RDW: 13.7 % (ref 11.5–15.5)
WBC: 8.7 10*3/uL (ref 4.0–10.5)
nRBC: 0 % (ref 0.0–0.2)

## 2021-12-10 LAB — VITAMIN D 25 HYDROXY (VIT D DEFICIENCY, FRACTURES): Vit D, 25-Hydroxy: 18.83 ng/mL — ABNORMAL LOW (ref 30–100)

## 2021-12-10 LAB — IRON AND TIBC
Iron: 56 ug/dL (ref 28–170)
Saturation Ratios: 12 % (ref 10.4–31.8)
TIBC: 452 ug/dL — ABNORMAL HIGH (ref 250–450)
UIBC: 396 ug/dL

## 2021-12-10 LAB — FERRITIN: Ferritin: 13 ng/mL (ref 11–307)

## 2021-12-10 LAB — VITAMIN B12: Vitamin B-12: 210 pg/mL (ref 180–914)

## 2021-12-10 LAB — FOLATE: Folate: 7.9 ng/mL (ref >5.9)

## 2021-12-11 NOTE — Progress Notes (Signed)
Virtual Visit via Telephone Note [facility]  I connected with Alejandra Barr  on 12/12/21  at  11:30 AM  by telephone and verified that I am speaking with the correct person using two identifiers.  Location: Patient: Home Provider: [facility]   I discussed the limitations, risks, security and privacy concerns of performing an evaluation and management service by telephone and the availability of in person appointments. I also discussed with the patient that there may be a patient responsible charge related to this service. The patient expressed understanding and agreed to proceed.   HISTORY OF PRESENT ILLNESS: [name] follows at our clinic for iron deficiency anemia, vitamin B12 deficiency, and vitamin D deficiency.  She was last evaluated by Tarri Abernethy PA-C via telemedicine visit on 06/03/2021.  At today's visit, she reports feeling fair.  She denies any recent hospitalizations, surgeries, or changes in her baseline health status.  Patient reports menorrhagia.  She has her period every 25 to 30 days, bleeds for a total of 7 to 9 days, 5 of which are heavy with large number of clots.  She has had progressive fatigue for the past few months.  She has chronic migraines.  She has chronic palpitations due to history of SVT s/p cardiac ablation at age 50, currently on beta-blocker and following with cardiology.  She denies any pica, restless legs, chest pain, dyspnea on exertion, lightheadedness, or syncope.    She is continuing to take her daily iron supplements, weekly folic acid supplement, and weekly B12 injections.  However, she ran out of her vitamin D supplement over a month ago.  She reports little to no energy and 100% appetite.  She is maintaining stable weight at this time.     OBSERVATIONS/OBJECTIVE: Review of Systems  Constitutional:  Positive for malaise/fatigue. Negative for chills, diaphoresis, fever and weight loss.   Respiratory:  Negative for cough and shortness of breath.   Cardiovascular:  Positive for palpitations. Negative for chest pain.  Gastrointestinal:  Negative for abdominal pain, blood in stool, melena, nausea and vomiting.  Genitourinary:        Menorrhagia  Musculoskeletal:  Positive for back pain.  Neurological:  Positive for tingling and headaches. Negative for dizziness.  Psychiatric/Behavioral:  Positive for depression.     PHYSICAL EXAM (per limitations of virtual telephone visit): The patient is alert and oriented x 3, exhibiting adequate mentation, good mood, and ability to speak in full sentences and execute sound judgement.   ASSESSMENT & PLAN: 1.  Iron deficiency anemia: - Venofer on 12/13/2020 on 12/20/2020. - Blood count had normalized prior to delivery of her recent baby (dropped to 10.9 on 03/02/2021), but has been recovering her blood counts since that time - Iron deficiency anemia is caused by menorrhagia - Symptomatic with significant fatigue - Most recent labs (12/10/2021): Hgb 11.9/MCV 88.7, ferritin 13, iron saturation 12% with TIBC 452 - Currently taking prenatal vitamin and ferrous sulfate 325 mg daily. - PLAN: Recommend IV Venofer 300 mg x 3. - Continue prenatal vitamin and oral iron tablet once daily. - RTC in 3 months with repeat labs. (Patient prefers follow-up every 3 months due to sudden drops in her iron)   2.  Vitamin B12 deficiency: - Currently taking B12 injection once weekly   - Most recent B12 (12/10/2021) level normal at 210, with methylmalonic acid pending - PLAN: Continue B12 injections once weekly.  Recheck B12/methylmalonic acid in 6 months (August 2023)  3.  Vitamin D deficiency: - Most recent vitamin D level (12/10/2021) is low at 18.83 - Currently taking ergocalciferol 50,000 units weekly, but ran out 1 to 2 months ago - PLAN: Continue vitamin D once a week.  Refill sent to pharmacy.  Recheck vitamin D in 6 months (August 2023)   4.  Folic acid  deficiency: - Folate checked on 12/10/2021 normal at 7.9 - Currently taking folic acid 1 mg daily   - PLAN: Continue folic acid supplement.  Recheck folic acid in 6 months (August 2023)   FOLLOW UP INSTRUCTIONS: Refill sent for vitamin D Venofer 300 mg x 3 Labs in 3 months Phone visit after labs    I discussed the assessment and treatment plan with the patient. The patient was provided an opportunity to ask questions and all were answered. The patient agreed with the plan and demonstrated an understanding of the instructions.   The patient was advised to call back or seek an in-person evaluation if the symptoms worsen or if the condition fails to improve as anticipated.  I provided 14 minutes of non-face-to-face time during this encounter.   Harriett Rush, PA-C 12/12/21 12:02 PM

## 2021-12-12 ENCOUNTER — Other Ambulatory Visit: Payer: Self-pay

## 2021-12-12 ENCOUNTER — Inpatient Hospital Stay (HOSPITAL_BASED_OUTPATIENT_CLINIC_OR_DEPARTMENT_OTHER): Payer: BC Managed Care – PPO | Admitting: Physician Assistant

## 2021-12-12 DIAGNOSIS — E559 Vitamin D deficiency, unspecified: Secondary | ICD-10-CM | POA: Diagnosis not present

## 2021-12-12 DIAGNOSIS — D500 Iron deficiency anemia secondary to blood loss (chronic): Secondary | ICD-10-CM

## 2021-12-12 DIAGNOSIS — G43701 Chronic migraine without aura, not intractable, with status migrainosus: Secondary | ICD-10-CM | POA: Diagnosis not present

## 2021-12-12 DIAGNOSIS — D508 Other iron deficiency anemias: Secondary | ICD-10-CM | POA: Diagnosis not present

## 2021-12-12 MED ORDER — VITAMIN D (ERGOCALCIFEROL) 1.25 MG (50000 UNIT) PO CAPS: 50000.0000 [IU] | ORAL_CAPSULE | ORAL | 11 refills | Status: DC

## 2021-12-15 LAB — METHYLMALONIC ACID, SERUM: Methylmalonic Acid, Quantitative: 143 nmol/L (ref 0–378)

## 2021-12-16 DIAGNOSIS — G43719 Chronic migraine without aura, intractable, without status migrainosus: Secondary | ICD-10-CM | POA: Diagnosis not present

## 2021-12-16 DIAGNOSIS — G43711 Chronic migraine without aura, intractable, with status migrainosus: Secondary | ICD-10-CM | POA: Diagnosis not present

## 2021-12-18 ENCOUNTER — Other Ambulatory Visit: Payer: Self-pay

## 2021-12-18 ENCOUNTER — Inpatient Hospital Stay (HOSPITAL_COMMUNITY): Payer: BC Managed Care – PPO

## 2021-12-18 VITALS — BP 105/50 | HR 84 | Temp 97.7°F | Resp 18

## 2021-12-18 DIAGNOSIS — D508 Other iron deficiency anemias: Secondary | ICD-10-CM

## 2021-12-18 DIAGNOSIS — E559 Vitamin D deficiency, unspecified: Secondary | ICD-10-CM | POA: Diagnosis not present

## 2021-12-18 DIAGNOSIS — N920 Excessive and frequent menstruation with regular cycle: Secondary | ICD-10-CM | POA: Diagnosis not present

## 2021-12-18 DIAGNOSIS — E538 Deficiency of other specified B group vitamins: Secondary | ICD-10-CM | POA: Diagnosis not present

## 2021-12-18 DIAGNOSIS — D500 Iron deficiency anemia secondary to blood loss (chronic): Secondary | ICD-10-CM | POA: Diagnosis not present

## 2021-12-18 MED ORDER — LORATADINE 10 MG PO TABS
10.0000 mg | ORAL_TABLET | Freq: Once | ORAL | Status: AC
  Administered 2021-12-18: 10 mg via ORAL
  Filled 2021-12-18: qty 1

## 2021-12-18 MED ORDER — SODIUM CHLORIDE 0.9 % IV SOLN
300.0000 mg | Freq: Once | INTRAVENOUS | Status: AC
  Administered 2021-12-18: 300 mg via INTRAVENOUS
  Filled 2021-12-18: qty 300

## 2021-12-18 MED ORDER — SODIUM CHLORIDE 0.9 % IV SOLN: Freq: Once | INTRAVENOUS | Status: AC

## 2021-12-18 MED ORDER — ACETAMINOPHEN 325 MG PO TABS
650.0000 mg | ORAL_TABLET | Freq: Once | ORAL | Status: AC
  Administered 2021-12-18: 650 mg via ORAL
  Filled 2021-12-18: qty 2

## 2021-12-18 NOTE — Patient Instructions (Signed)
[facility]  Discharge Instructions: Thank you for choosing [facility] to provide your oncology and hematology care.  If you have a lab appointment with the [facility], please come in thru the Main Entrance and check in at the main information desk.  Wear comfortable clothing and clothing appropriate for easy access to any Portacath or PICC line.   We strive to give you quality time with your provider. You may need to reschedule your appointment if you arrive late (15 or more minutes).  Arriving late affects you and other patients whose appointments are after yours.  Also, if you miss three or more appointments without notifying the office, you may be dismissed from the clinic at the providers discretion.      For prescription refill requests, have your pharmacy contact our office and allow 72 hours for refills to be completed.    Today you received the following chemotherapy and/or immunotherapy    To help prevent nausea and vomiting after your treatment, we encourage you to take your nausea medication as directed.  BELOW ARE SYMPTOMS THAT SHOULD BE REPORTED IMMEDIATELY: *FEVER GREATER THAN 100.4 F (38 C) OR HIGHER *CHILLS OR SWEATING *NAUSEA AND VOMITING THAT IS NOT CONTROLLED WITH YOUR NAUSEA MEDICATION *UNUSUAL SHORTNESS OF BREATH *UNUSUAL BRUISING OR BLEEDING *URINARY PROBLEMS (pain or burning when urinating, or frequent urination) *BOWEL PROBLEMS (unusual diarrhea, constipation, pain near the anus) TENDERNESS IN MOUTH AND THROAT WITH OR WITHOUT PRESENCE OF ULCERS (sore throat, sores in mouth, or a toothache) UNUSUAL RASH, SWELLING OR PAIN  UNUSUAL VAGINAL DISCHARGE OR ITCHING   Items with * indicate a potential emergency and should be followed up as soon as possible or go to the Emergency Department if any problems should occur.  Please show the CHEMOTHERAPY ALERT CARD or IMMUNOTHERAPY ALERT CARD at check-in to the Emergency Department and triage  nurse.  Should you have questions after your visit or need to cancel or reschedule your appointment, please contact [facility] [phone]  and follow the prompts.  Office hours are 8:00 a.m. to 4:30 p.m. Monday - Friday. Please note that voicemails left after 4:00 p.m. may not be returned until the following business day.  We are closed weekends and major holidays. You have access to a nurse at all times for urgent questions. Please call the main number to the clinic [phone] and follow the prompts.  For any non-urgent questions, you may also contact your provider using MyChart. We now offer e-Visits for anyone 61 and older to request care online for non-urgent symptoms. For details visit [email or web address].   Also download the MyChart app! Go to the app store, search "MyChart", open the app, select [facility], and log in with your MyChart username and password.  Due to Covid, a mask is required upon entering the hospital/clinic. If you do not have a mask, one will be given to you upon arrival. For doctor visits, patients may have 1 support person aged 42 or older with them. For treatment visits, patients cannot have anyone with them due to current Covid guidelines and our immunocompromised population.

## 2021-12-18 NOTE — Progress Notes (Signed)
Iron infusion given per orders. Patient tolerated it well without problems. Vitals stable and discharged home from clinic ambulatory. Follow up as scheduled.  

## 2021-12-25 ENCOUNTER — Other Ambulatory Visit: Payer: Self-pay

## 2021-12-25 ENCOUNTER — Inpatient Hospital Stay (HOSPITAL_COMMUNITY): Payer: BC Managed Care – PPO

## 2021-12-25 VITALS — BP 98/52 | HR 79 | Temp 97.7°F | Resp 18 | Ht 61.0 in | Wt 216.0 lb

## 2021-12-25 DIAGNOSIS — E538 Deficiency of other specified B group vitamins: Secondary | ICD-10-CM | POA: Diagnosis not present

## 2021-12-25 DIAGNOSIS — N920 Excessive and frequent menstruation with regular cycle: Secondary | ICD-10-CM | POA: Diagnosis not present

## 2021-12-25 DIAGNOSIS — E559 Vitamin D deficiency, unspecified: Secondary | ICD-10-CM | POA: Diagnosis not present

## 2021-12-25 DIAGNOSIS — D508 Other iron deficiency anemias: Secondary | ICD-10-CM

## 2021-12-25 DIAGNOSIS — D500 Iron deficiency anemia secondary to blood loss (chronic): Secondary | ICD-10-CM | POA: Diagnosis not present

## 2021-12-25 MED ORDER — LORATADINE 10 MG PO TABS
10.0000 mg | ORAL_TABLET | Freq: Once | ORAL | Status: AC
  Administered 2021-12-25: 10 mg via ORAL
  Filled 2021-12-25: qty 1

## 2021-12-25 MED ORDER — SODIUM CHLORIDE 0.9 % IV SOLN
300.0000 mg | Freq: Once | INTRAVENOUS | Status: AC
  Administered 2021-12-25: 300 mg via INTRAVENOUS
  Filled 2021-12-25: qty 300

## 2021-12-25 MED ORDER — ACETAMINOPHEN 325 MG PO TABS
650.0000 mg | ORAL_TABLET | Freq: Once | ORAL | Status: AC
  Administered 2021-12-25: 650 mg via ORAL
  Filled 2021-12-25: qty 2

## 2021-12-25 MED ORDER — SODIUM CHLORIDE 0.9 % IV SOLN: Freq: Once | INTRAVENOUS | Status: AC

## 2021-12-25 NOTE — Patient Instructions (Signed)
[facility]  Discharge Instructions: Thank you for choosing [facility] to provide your oncology and hematology care.  If you have a lab appointment with the Cancer Center, please come in thru the Main Entrance and check in at the main information desk.  Wear comfortable clothing and clothing appropriate for easy access to any Portacath or PICC line.   We strive to give you quality time with your provider. You may need to reschedule your appointment if you arrive late (15 or more minutes).  Arriving late affects you and other patients whose appointments are after yours.  Also, if you miss three or more appointments without notifying the office, you may be dismissed from the clinic at the provider's discretion.      For prescription refill requests, have your pharmacy contact our office and allow 72 hours for refills to be completed.        To help prevent nausea and vomiting after your treatment, we encourage you to take your nausea medication as directed.  BELOW ARE SYMPTOMS THAT SHOULD BE REPORTED IMMEDIATELY: *FEVER GREATER THAN 100.4 F (38 C) OR HIGHER *CHILLS OR SWEATING *NAUSEA AND VOMITING THAT IS NOT CONTROLLED WITH YOUR NAUSEA MEDICATION *UNUSUAL SHORTNESS OF BREATH *UNUSUAL BRUISING OR BLEEDING *URINARY PROBLEMS (pain or burning when urinating, or frequent urination) *BOWEL PROBLEMS (unusual diarrhea, constipation, pain near the anus) TENDERNESS IN MOUTH AND THROAT WITH OR WITHOUT PRESENCE OF ULCERS (sore throat, sores in mouth, or a toothache) UNUSUAL RASH, SWELLING OR PAIN  UNUSUAL VAGINAL DISCHARGE OR ITCHING   Items with * indicate a potential emergency and should be followed up as soon as possible or go to the Emergency Department if any problems should occur.  Please show the CHEMOTHERAPY ALERT CARD or IMMUNOTHERAPY ALERT CARD at check-in to the Emergency Department and triage nurse.  Should you have questions after your visit or need to cancel  or reschedule your appointment, please contact [facility] [phone]  and follow the prompts.  Office hours are 8:00 a.m. to 4:30 p.m. Monday - Friday. Please note that voicemails left after 4:00 p.m. may not be returned until the following business day.  We are closed weekends and major holidays. You have access to a nurse at all times for urgent questions. Please call the main number to the clinic [phone] and follow the prompts.  For any non-urgent questions, you may also contact your provider using MyChart. We now offer e-Visits for anyone 18 and older to request care online for non-urgent symptoms. For details visit [email or web address].   Also download the MyChart app! Go to the app store, search "MyChart", open the app, select [facility], and log in with your MyChart username and password.  Due to Covid, a mask is required upon entering the hospital/clinic. If you do not have a mask, one will be given to you upon arrival. For doctor visits, patients may have 1 support person aged 18 or older with them. For treatment visits, patients cannot have anyone with them due to current Covid guidelines and our immunocompromised population.  

## 2021-12-25 NOTE — Progress Notes (Signed)
Patient presents today for iron infusion.  Patient is in satisfactory condition with no complaints voiced.  Vital signs are stable.  We will proceed with treatment per MD orders.  Patient tolerated treatment well with no complaints voiced.  Patient left ambulatory in stable condition.  Vital signs stable at discharge.  Follow up as scheduled.    

## 2022-01-01 ENCOUNTER — Other Ambulatory Visit: Payer: Self-pay

## 2022-01-01 ENCOUNTER — Inpatient Hospital Stay (HOSPITAL_COMMUNITY): Payer: BC Managed Care – PPO | Attending: Hematology

## 2022-01-01 VITALS — BP 121/64 | HR 95 | Temp 99.0°F | Resp 18

## 2022-01-01 DIAGNOSIS — D508 Other iron deficiency anemias: Secondary | ICD-10-CM

## 2022-01-01 DIAGNOSIS — N920 Excessive and frequent menstruation with regular cycle: Secondary | ICD-10-CM | POA: Diagnosis not present

## 2022-01-01 DIAGNOSIS — D500 Iron deficiency anemia secondary to blood loss (chronic): Secondary | ICD-10-CM | POA: Insufficient documentation

## 2022-01-01 MED ORDER — SODIUM CHLORIDE 0.9 % IV SOLN: Freq: Once | INTRAVENOUS | Status: AC

## 2022-01-01 MED ORDER — SODIUM CHLORIDE 0.9 % IV SOLN
300.0000 mg | Freq: Once | INTRAVENOUS | Status: AC
  Administered 2022-01-01: 300 mg via INTRAVENOUS
  Filled 2022-01-01: qty 300

## 2022-01-01 MED ORDER — LORATADINE 10 MG PO TABS
10.0000 mg | ORAL_TABLET | Freq: Once | ORAL | Status: AC
  Administered 2022-01-01: 10 mg via ORAL
  Filled 2022-01-01: qty 1

## 2022-01-01 MED ORDER — ACETAMINOPHEN 325 MG PO TABS
650.0000 mg | ORAL_TABLET | Freq: Once | ORAL | Status: AC
  Administered 2022-01-01: 650 mg via ORAL
  Filled 2022-01-01: qty 2

## 2022-01-01 NOTE — Progress Notes (Signed)
Patient presents today for Venofer infusion per providers order.  Vital signs WNL.  Patient has no new complaints at this time.  Peripheral IV started and blood return noted pre and post infusion.  Venofer infusion given today per MD orders.  Stable during infusion without adverse affects.  Vital signs stable.  No complaints at this time.  Discharge from clinic ambulatory in stable condition.  Alert and oriented X 3.  Follow up with [facility] as scheduled.  

## 2022-01-01 NOTE — Patient Instructions (Signed)
[facility]  Discharge Instructions: Thank you for choosing [facility] to provide your oncology and hematology care.  If you have a lab appointment with the Cancer Center, please come in thru the Main Entrance and check in at the main information desk.  Wear comfortable clothing and clothing appropriate for easy access to any Portacath or PICC line.   We strive to give you quality time with your provider. You may need to reschedule your appointment if you arrive late (15 or more minutes).  Arriving late affects you and other patients whose appointments are after yours.  Also, if you miss three or more appointments without notifying the office, you may be dismissed from the clinic at the provider's discretion.      For prescription refill requests, have your pharmacy contact our office and allow 72 hours for refills to be completed.    Today you received the following chemotherapy and/or immunotherapy agents Venofer      To help prevent nausea and vomiting after your treatment, we encourage you to take your nausea medication as directed.  BELOW ARE SYMPTOMS THAT SHOULD BE REPORTED IMMEDIATELY: *FEVER GREATER THAN 100.4 F (38 C) OR HIGHER *CHILLS OR SWEATING *NAUSEA AND VOMITING THAT IS NOT CONTROLLED WITH YOUR NAUSEA MEDICATION *UNUSUAL SHORTNESS OF BREATH *UNUSUAL BRUISING OR BLEEDING *URINARY PROBLEMS (pain or burning when urinating, or frequent urination) *BOWEL PROBLEMS (unusual diarrhea, constipation, pain near the anus) TENDERNESS IN MOUTH AND THROAT WITH OR WITHOUT PRESENCE OF ULCERS (sore throat, sores in mouth, or a toothache) UNUSUAL RASH, SWELLING OR PAIN  UNUSUAL VAGINAL DISCHARGE OR ITCHING   Items with * indicate a potential emergency and should be followed up as soon as possible or go to the Emergency Department if any problems should occur.  Please show the CHEMOTHERAPY ALERT CARD or IMMUNOTHERAPY ALERT CARD at check-in to the Emergency  Department and triage nurse.  Should you have questions after your visit or need to cancel or reschedule your appointment, please contact [facility] [phone]  and follow the prompts.  Office hours are 8:00 a.m. to 4:30 p.m. Monday - Friday. Please note that voicemails left after 4:00 p.m. may not be returned until the following business day.  We are closed weekends and major holidays. You have access to a nurse at all times for urgent questions. Please call the main number to the clinic [phone] and follow the prompts.  For any non-urgent questions, you may also contact your provider using MyChart. We now offer e-Visits for anyone 18 and older to request care online for non-urgent symptoms. For details visit [email or web address].   Also download the MyChart app! Go to the app store, search "MyChart", open the app, select [facility], and log in with your MyChart username and password.  Due to Covid, a mask is required upon entering the hospital/clinic. If you do not have a mask, one will be given to you upon arrival. For doctor visits, patients may have 1 support person aged 18 or older with them. For treatment visits, patients cannot have anyone with them due to current Covid guidelines and our immunocompromised population.  

## 2022-03-05 ENCOUNTER — Ambulatory Visit (INDEPENDENT_AMBULATORY_CARE_PROVIDER_SITE_OTHER): Payer: BC Managed Care – PPO | Admitting: Family Medicine

## 2022-03-05 ENCOUNTER — Telehealth: Payer: Self-pay

## 2022-03-05 ENCOUNTER — Encounter: Payer: Self-pay | Admitting: Family Medicine

## 2022-03-05 VITALS — BP 127/82 | HR 103 | Resp 16 | Ht 61.0 in | Wt 207.0 lb

## 2022-03-05 DIAGNOSIS — M25551 Pain in right hip: Secondary | ICD-10-CM

## 2022-03-05 DIAGNOSIS — E039 Hypothyroidism, unspecified: Secondary | ICD-10-CM

## 2022-03-05 DIAGNOSIS — R2232 Localized swelling, mass and lump, left upper limb: Secondary | ICD-10-CM | POA: Diagnosis not present

## 2022-03-05 DIAGNOSIS — M25561 Pain in right knee: Secondary | ICD-10-CM

## 2022-03-05 DIAGNOSIS — D508 Other iron deficiency anemias: Secondary | ICD-10-CM

## 2022-03-05 DIAGNOSIS — G8929 Other chronic pain: Secondary | ICD-10-CM

## 2022-03-05 DIAGNOSIS — M5416 Radiculopathy, lumbar region: Secondary | ICD-10-CM

## 2022-03-05 DIAGNOSIS — Z13220 Encounter for screening for lipoid disorders: Secondary | ICD-10-CM

## 2022-03-05 DIAGNOSIS — M5442 Lumbago with sciatica, left side: Secondary | ICD-10-CM

## 2022-03-05 DIAGNOSIS — E559 Vitamin D deficiency, unspecified: Secondary | ICD-10-CM

## 2022-03-05 DIAGNOSIS — Z803 Family history of malignant neoplasm of breast: Secondary | ICD-10-CM

## 2022-03-05 NOTE — Telephone Encounter (Signed)
Called [facility] to schedule a diagnotic mammogram, they would not schedule until they have received dx bilateral order and left breast ultrasound fax to them at [phone].   ? ?Once they have received we need to call and scheduled the diagnotic mammogram [phone] for patient, patient request a Tuesday afternoon around 2:00 pm. ?

## 2022-03-05 NOTE — Patient Instructions (Addendum)
F/U in 3.5 month call if you need me before ? ?Nurse please order diagnostic mammogram ( use lump in axilla and f/h breast ca as dx pls) ? ?Please get fasting lipid, cmp and EGFr, HBA1C and TSH next week when youget labs at the hospital ? ?You need x ray of lumbar spine, right hip and right knee  ? ?We will arrange MRI of low back based on symptoms and exam ? ?Congrats on weight loss ? Eat over a 12 hour period ?Drink 64 oz water / day ? ?80 % food vegetable and fruit , with rest being protein, bean, eggs or animal protein ? ?Thanks for choosing [facility], we consider it a privelige to serve you. ? ? ? ?

## 2022-03-05 NOTE — Progress Notes (Signed)
? ?  Alejandra Barr     MRN: [id number]      DOB: 15-Oct-1986 ? ? ?HPI ?Alejandra Barr is here for follow up and re-evaluation of chronic medical conditions, medication management and review of any available recent lab and radiology data.  ?Preventive health is updated, specifically  Cancer screening and Immunization.   ?Questions or concerns regarding consultations or procedures which the PT has had in the interim are  addressed. ?The PT denies any adverse reactions to current medications since the last visit.  ?1 year h/o lump in left axilla, feels it is enlarging, positve f/oh breast cancer, needs imaging ?LBP radaiting to right buttock and groin,  both feet  tingle and are numb ? ?ROS: ?Denies recent fever or chills. ?Denies sinus pressure, nasal congestion, ear pain or sore throat. ?Denies chest congestion, productive cough or wheezing. ?Denies chest pains, palpitations and leg swelling ?Denies abdominal pain, nausea, vomiting,diarrhea or constipation.   ?Denies dysuria, frequency, hesitancy or incontinence. ? ?Denies headaches, seizures, numbness, or tingling. ?Denies depression, anxiety or insomnia. ?Denies skin break down or rash. ? ? ?PE ? ?BP 127/82   Pulse (!) 103   Resp 16   Ht '5\' 1"'$  (1.549 m)   Wt 207 lb (93.9 kg)   SpO2 98%   BMI 39.11 kg/m?  ? ?Patient alert and oriented and in no cardiopulmonary distress. ? ?HEENT: No facial asymmetry, EOMI,     Neck supple . ? ?Chest: Clear to auscultation bilaterally. ?Left axilla: lump present tender to deep palpation ?CVS: S1, S2 no murmurs, no S3.Regular rate. ? ?ABD: Soft non tender.  ? ?Ext: No edema ? ?MS: decreased  ROM lumbar spine, adequate in shoulders,  decreased in right hip, and decreased in right knee. ? ?Skin: Intact, no ulcerations or rash noted. ? ?Psych: Good eye contact, normal affect. Memory intact not anxious or depressed appearing. ? ?CNS: CN 2-12 intact, grade 4 power and rduced sensation in RLE, otherwise normal  ? ?Assessment & Plan ? ?Lump  of axilla, left ?Left axillary lump, woth concern of f/h of breast cancer, diagnostic imaging needed ? ?Morbid obesity ([place]) ? ?Patient re-educated about  the importance of commitment to a  minimum of 150 minutes of exercise per week as able. ? ?The importance of healthy food choices with portion control discussed, as well as eating regularly and within a 12 hour window most days. ?The need to choose "clean , green" food 50 to 75% of the time is discussed, as well as to make water the primary drink and set a goal of 64 ounces water daily. ? ?  ? ?  03/05/2022  ?  3:00 PM 12/25/2021  ?  8:23 AM 06/19/2021  ?  3:01 PM  ?Weight /BMI  ?Weight 207 lb 216 lb 223 lb 12.8 oz  ?Height '5\' 1"'$  (1.549 m) '5\' 1"'$  (1.549 m) '5\' 1"'$  (1.549 m)  ?BMI 39.11 kg/m2 40.81 kg/m2 42.29 kg/m2  ?improved, she is applauded  On this ? ? ? ?Iron deficiency anemia ?Managed by Hematology ? ?Vitamin D deficiency ?Updated lab needed at/ before next visit. ? ? ?Lumbar back pain with radiculopathy affecting right lower extremity ?Pain radaiting to RLE with wekaness and reduced sensation in RLE, obtain X ray and MRIL of lumbar spine ? ?Right hip pain ?Reduced ROM and pain, obtain x ray of hip ? ?Right knee pain ?Pain and reduced ROM, obtain X ray ? ?

## 2022-03-06 DIAGNOSIS — G43711 Chronic migraine without aura, intractable, with status migrainosus: Secondary | ICD-10-CM | POA: Diagnosis not present

## 2022-03-06 NOTE — Telephone Encounter (Signed)
Called setup appointment, appointment scheduled for 05.23.2023 at 1:20 pm.  They cancel the other order that has right breast, since left was ordered. ?

## 2022-03-08 ENCOUNTER — Encounter: Payer: Self-pay | Admitting: Family Medicine

## 2022-03-08 DIAGNOSIS — M25561 Pain in right knee: Secondary | ICD-10-CM | POA: Insufficient documentation

## 2022-03-08 DIAGNOSIS — M25551 Pain in right hip: Secondary | ICD-10-CM | POA: Insufficient documentation

## 2022-03-08 NOTE — Assessment & Plan Note (Signed)
Pain and reduced ROM, obtain X ray ?

## 2022-03-08 NOTE — Assessment & Plan Note (Signed)
Left axillary lump, woth concern of f/h of breast cancer, diagnostic imaging needed ?

## 2022-03-08 NOTE — Assessment & Plan Note (Signed)
Pain radaiting to RLE with wekaness and reduced sensation in RLE, obtain X ray and MRIL of lumbar spine ?

## 2022-03-08 NOTE — Assessment & Plan Note (Signed)
Managed by Hematology.

## 2022-03-08 NOTE — Assessment & Plan Note (Signed)
?  Patient re-educated about  the importance of commitment to a  minimum of 150 minutes of exercise per week as able. ? ?The importance of healthy food choices with portion control discussed, as well as eating regularly and within a 12 hour window most days. ?The need to choose "clean , green" food 50 to 75% of the time is discussed, as well as to make water the primary drink and set a goal of 64 ounces water daily. ? ?  ? ?  03/05/2022  ?  3:00 PM 12/25/2021  ?  8:23 AM 06/19/2021  ?  3:01 PM  ?Weight /BMI  ?Weight 207 lb 216 lb 223 lb 12.8 oz  ?Height '5\' 1"'$  (1.549 m) '5\' 1"'$  (1.549 m) '5\' 1"'$  (1.549 m)  ?BMI 39.11 kg/m2 40.81 kg/m2 42.29 kg/m2  ?improved, she is applauded  On this ? ? ?

## 2022-03-08 NOTE — Assessment & Plan Note (Signed)
Reduced ROM and pain, obtain x ray of hip ?

## 2022-03-08 NOTE — Assessment & Plan Note (Signed)
Updated lab needed at/ before next visit.   

## 2022-03-10 ENCOUNTER — Ambulatory Visit (HOSPITAL_COMMUNITY)
Admission: RE | Admit: 2022-03-10 | Discharge: 2022-03-10 | Disposition: A | Discharge status: Home / Self Care | Payer: BC Managed Care – PPO | Source: Ambulatory Visit | Attending: Family Medicine | Admitting: Family Medicine

## 2022-03-10 ENCOUNTER — Other Ambulatory Visit: Payer: Self-pay | Admitting: Family Medicine

## 2022-03-10 ENCOUNTER — Inpatient Hospital Stay (HOSPITAL_COMMUNITY): Payer: BC Managed Care – PPO | Attending: Hematology

## 2022-03-10 ENCOUNTER — Other Ambulatory Visit (HOSPITAL_COMMUNITY)
Admission: RE | Admit: 2022-03-10 | Discharge: 2022-03-10 | Disposition: A | Discharge status: Home / Self Care | Payer: BC Managed Care – PPO | Source: Ambulatory Visit | Attending: Family Medicine | Admitting: Family Medicine

## 2022-03-10 DIAGNOSIS — M25551 Pain in right hip: Secondary | ICD-10-CM | POA: Insufficient documentation

## 2022-03-10 DIAGNOSIS — M25561 Pain in right knee: Secondary | ICD-10-CM | POA: Insufficient documentation

## 2022-03-10 DIAGNOSIS — D500 Iron deficiency anemia secondary to blood loss (chronic): Secondary | ICD-10-CM | POA: Diagnosis not present

## 2022-03-10 DIAGNOSIS — M545 Low back pain, unspecified: Secondary | ICD-10-CM | POA: Diagnosis not present

## 2022-03-10 DIAGNOSIS — Z13220 Encounter for screening for lipoid disorders: Secondary | ICD-10-CM

## 2022-03-10 DIAGNOSIS — E559 Vitamin D deficiency, unspecified: Secondary | ICD-10-CM | POA: Insufficient documentation

## 2022-03-10 DIAGNOSIS — E538 Deficiency of other specified B group vitamins: Secondary | ICD-10-CM | POA: Insufficient documentation

## 2022-03-10 DIAGNOSIS — N920 Excessive and frequent menstruation with regular cycle: Secondary | ICD-10-CM | POA: Insufficient documentation

## 2022-03-10 DIAGNOSIS — M5442 Lumbago with sciatica, left side: Secondary | ICD-10-CM | POA: Insufficient documentation

## 2022-03-10 DIAGNOSIS — G8929 Other chronic pain: Secondary | ICD-10-CM

## 2022-03-10 DIAGNOSIS — E039 Hypothyroidism, unspecified: Secondary | ICD-10-CM | POA: Insufficient documentation

## 2022-03-10 LAB — CBC WITH DIFFERENTIAL/PLATELET
Abs Immature Granulocytes: 0.02 10*3/uL (ref 0.00–0.07)
Basophils Absolute: 0.1 10*3/uL (ref 0.0–0.1)
Basophils Relative: 1 %
Eosinophils Absolute: 0.2 10*3/uL (ref 0.0–0.5)
Eosinophils Relative: 2 %
HCT: 39.6 % (ref 36.0–46.0)
Hemoglobin: 12.4 g/dL (ref 12.0–15.0)
Immature Granulocytes: 0 %
Lymphocytes Relative: 21 %
Lymphs Abs: 1.6 10*3/uL (ref 0.7–4.0)
MCH: 28.3 pg (ref 26.0–34.0)
MCHC: 31.3 g/dL (ref 30.0–36.0)
MCV: 90.4 fL (ref 80.0–100.0)
Monocytes Absolute: 0.4 10*3/uL (ref 0.1–1.0)
Monocytes Relative: 5 %
Neutro Abs: 5.3 10*3/uL (ref 1.7–7.7)
Neutrophils Relative %: 71 %
Platelets: 249 10*3/uL (ref 150–400)
RBC: 4.38 MIL/uL (ref 3.87–5.11)
RDW: 13.3 % (ref 11.5–15.5)
WBC: 7.6 10*3/uL (ref 4.0–10.5)
nRBC: 0 % (ref 0.0–0.2)

## 2022-03-10 LAB — IRON AND TIBC
Iron: 96 ug/dL (ref 28–170)
Saturation Ratios: 28 % (ref 10.4–31.8)
TIBC: 337 ug/dL (ref 250–450)
UIBC: 241 ug/dL

## 2022-03-10 LAB — BASIC METABOLIC PANEL
Anion gap: 6 (ref 5–15)
BUN: 11 mg/dL (ref 6–20)
CO2: 24 mmol/L (ref 22–32)
Calcium: 8.9 mg/dL (ref 8.9–10.3)
Chloride: 110 mmol/L (ref 98–111)
Creatinine, Ser: 0.71 mg/dL (ref 0.44–1.00)
GFR, Estimated: >60 mL/min (ref >60)
Glucose, Bld: 102 mg/dL — ABNORMAL HIGH (ref 70–99)
Potassium: 4 mmol/L (ref 3.5–5.1)
Sodium: 140 mmol/L (ref 135–145)

## 2022-03-10 LAB — FERRITIN: Ferritin: 149 ng/mL (ref 11–307)

## 2022-03-10 LAB — LIPID PANEL
Cholesterol: 160 mg/dL (ref 0–200)
HDL: 52 mg/dL (ref >40)
LDL Cholesterol: 93 mg/dL (ref 0–99)
Total CHOL/HDL Ratio: 3.1 RATIO
Triglycerides: 73 mg/dL (ref <150)
VLDL: 15 mg/dL (ref 0–40)

## 2022-03-10 LAB — TSH: TSH: 0.438 u[IU]/mL (ref 0.350–4.500)

## 2022-03-11 ENCOUNTER — Inpatient Hospital Stay (HOSPITAL_COMMUNITY): Payer: BC Managed Care – PPO

## 2022-03-11 DIAGNOSIS — G43719 Chronic migraine without aura, intractable, without status migrainosus: Secondary | ICD-10-CM | POA: Diagnosis not present

## 2022-03-11 DIAGNOSIS — G43711 Chronic migraine without aura, intractable, with status migrainosus: Secondary | ICD-10-CM | POA: Diagnosis not present

## 2022-03-11 LAB — HEMOGLOBIN A1C
Hgb A1c MFr Bld: 5.1 % (ref 4.8–5.6)
Mean Plasma Glucose: 100 mg/dL

## 2022-03-12 NOTE — Progress Notes (Signed)
? ?Virtual Visit via Telephone Note ?[place] ? ?I connected with [name]  on 03/13/22 at  9:19 AM by telephone and verified that I am speaking with the correct person using two identifiers. ? ?Location: ?Patient: Home ?Provider: [place] ?  ?I discussed the limitations, risks, security and privacy concerns of performing an evaluation and management service by telephone and the availability of in person appointments. I also discussed with the patient that there may be a patient responsible charge related to this service. The patient expressed understanding and agreed to proceed. ? ?REASON FOR VISIT:  ?Follow-up for iron deficiency anemia and B12 deficiency ? ?CURRENT THERAPY: Intermittent IV iron ? ?INTERVAL HISTORY:  ?[name] 36 y.o. female returns for routine follow-up of her iron deficiency anemia and B12 deficiency.  She was last evaluated via telemedicine visit by Tarri Abernethy PA-C on 12/12/2021. ? ?At today's visit, she reports feeling well apart from some recent issues with back pain, which are being managed by her PCP.   ?She reports that she has been feeling better after her last IV iron.  She continues to have menorrhagia but denies bright red blood per rectum or melena.  She denies any current fatigue, pica, restless legs, chest pain, dyspnea on exertion, lightheadedness, or syncope.  She has chronic migraine headaches. She is continuing to take her daily iron supplements, weekly folic acid supplement, weekly vitamin D supplement, and weekly B12 injections.  ?She has 100% energy and 100% appetite. She endorses that she is maintaining a stable weight. ? ?  ?OBSERVATIONS/OBJECTIVE: ?Review of Systems  ?Constitutional:  Negative for chills, diaphoresis, fever, malaise/fatigue and weight loss.  ?Respiratory:  Negative for cough and shortness of breath.   ?Cardiovascular:  Positive for palpitations. Negative for chest pain.  ?Gastrointestinal:  Negative for  abdominal pain, blood in stool, melena, nausea and vomiting.  ?Neurological:  Positive for tingling and headaches. Negative for dizziness.  ?Psychiatric/Behavioral:  Positive for depression. The patient has insomnia.    ? ?PHYSICAL EXAM (per limitations of virtual telephone visit): The patient is alert and oriented x 3, exhibiting adequate mentation, good mood, and ability to speak in full sentences and execute sound judgement. ? ? ?ASSESSMENT & PLAN: ?1.  Iron deficiency anemia: ?- History of gastric bypass surgery ?-- Venofer 300 mg x 3 from 12/18/2021 through 01/01/2022 ?- Iron deficiency anemia is caused by menorrhagia and malabsorption  ?- Symptoms improved after IV iron  ?- Most recent labs (03/10/2022): Hgb 12.4/MCV 90.4, ferritin 149, iron saturation 28% ?- Currently taking prenatal vitamin and ferrous sulfate 325 mg daily. ?- PLAN: No indication for IV iron at this time ?- Continue oral iron tablet once daily. ?- RTC in 3 months with repeat labs. (Patient prefers follow-up every 3 months due to sudden drops in her iron).  Next visit will be PHONE VISIT, but she is agreeable to annual in person visit. ? ?2.  Vitamin B12 deficiency: ?- Currently taking B12 injection once weekly     ?- Most recent B12 (12/10/2021) level normal at 210, with methylmalonic acid pending ?- PLAN: Continue B12 injections once weekly.  Recheck B12/methylmalonic acid in 3 months (August 2023) ?  ?3.  Vitamin D deficiency: ?- Most recent vitamin D level (12/10/2021) is low at 18.83 ?- Currently taking ergocalciferol 50,000 units weekly   ?- PLAN: Continue vitamin D once a week.  Recheck vitamin D in 3 months (August 2023) ?  ?4.  Folic acid deficiency: ?-  Folate checked on 12/10/2021 normal at 7.9 ?- Currently taking folic acid 1 mg daily     ?- PLAN: Continue folic acid supplement.  Recheck folic acid in 3 months (August 2023) ? ? ?  ?I discussed the assessment and treatment plan with the patient. The patient was provided an opportunity to ask  questions and all were answered. The patient agreed with the plan and demonstrated an understanding of the instructions. ?  ?The patient was advised to call back or seek an in-person evaluation if the symptoms worsen or if the condition fails to improve as anticipated. ? ?I provided 17 minutes of non-face-to-face time during this encounter. ? ? ?Harriett Rush, PA-C ?03/13/22 9:44 AM  ?

## 2022-03-13 ENCOUNTER — Inpatient Hospital Stay (HOSPITAL_BASED_OUTPATIENT_CLINIC_OR_DEPARTMENT_OTHER): Payer: BC Managed Care – PPO | Admitting: Physician Assistant

## 2022-03-13 DIAGNOSIS — D500 Iron deficiency anemia secondary to blood loss (chronic): Secondary | ICD-10-CM | POA: Diagnosis not present

## 2022-03-13 DIAGNOSIS — E559 Vitamin D deficiency, unspecified: Secondary | ICD-10-CM | POA: Diagnosis not present

## 2022-03-13 DIAGNOSIS — D508 Other iron deficiency anemias: Secondary | ICD-10-CM

## 2022-03-13 DIAGNOSIS — E538 Deficiency of other specified B group vitamins: Secondary | ICD-10-CM | POA: Diagnosis not present

## 2022-03-13 MED ORDER — CYANOCOBALAMIN 1000 MCG/ML IJ SOLN: 1000.0000 ug | INTRAMUSCULAR | 11 refills | Status: DC

## 2022-03-13 MED ORDER — VITAMIN D (ERGOCALCIFEROL) 1.25 MG (50000 UNIT) PO CAPS: 50000.0000 [IU] | ORAL_CAPSULE | ORAL | 11 refills | Status: DC

## 2022-03-23 DIAGNOSIS — G43711 Chronic migraine without aura, intractable, with status migrainosus: Secondary | ICD-10-CM | POA: Diagnosis not present

## 2022-03-23 DIAGNOSIS — Z79899 Other long term (current) drug therapy: Secondary | ICD-10-CM | POA: Diagnosis not present

## 2022-03-23 DIAGNOSIS — M546 Pain in thoracic spine: Secondary | ICD-10-CM | POA: Diagnosis not present

## 2022-03-23 DIAGNOSIS — M542 Cervicalgia: Secondary | ICD-10-CM | POA: Diagnosis not present

## 2022-03-24 ENCOUNTER — Ambulatory Visit
Admission: RE | Admit: 2022-03-24 | Discharge: 2022-03-24 | Disposition: A | Discharge status: Home / Self Care | Payer: BC Managed Care – PPO | Source: Ambulatory Visit | Attending: Family Medicine | Admitting: Family Medicine

## 2022-03-24 ENCOUNTER — Other Ambulatory Visit: Payer: Self-pay | Admitting: Family Medicine

## 2022-03-24 DIAGNOSIS — R2232 Localized swelling, mass and lump, left upper limb: Secondary | ICD-10-CM

## 2022-03-24 DIAGNOSIS — R928 Other abnormal and inconclusive findings on diagnostic imaging of breast: Secondary | ICD-10-CM | POA: Diagnosis not present

## 2022-03-24 DIAGNOSIS — N6489 Other specified disorders of breast: Secondary | ICD-10-CM | POA: Diagnosis not present

## 2022-03-31 ENCOUNTER — Ambulatory Visit (HOSPITAL_COMMUNITY)
Admission: RE | Admit: 2022-03-31 | Discharge: 2022-03-31 | Disposition: A | Discharge status: Home / Self Care | Payer: BC Managed Care – PPO | Source: Ambulatory Visit | Attending: Family Medicine | Admitting: Family Medicine

## 2022-03-31 DIAGNOSIS — M5136 Other intervertebral disc degeneration, lumbar region: Secondary | ICD-10-CM | POA: Diagnosis not present

## 2022-03-31 DIAGNOSIS — M2578 Osteophyte, vertebrae: Secondary | ICD-10-CM | POA: Diagnosis not present

## 2022-03-31 DIAGNOSIS — M5416 Radiculopathy, lumbar region: Secondary | ICD-10-CM | POA: Insufficient documentation

## 2022-03-31 DIAGNOSIS — M5126 Other intervertebral disc displacement, lumbar region: Secondary | ICD-10-CM | POA: Diagnosis not present

## 2022-03-31 DIAGNOSIS — M5127 Other intervertebral disc displacement, lumbosacral region: Secondary | ICD-10-CM | POA: Diagnosis not present

## 2022-04-01 ENCOUNTER — Encounter: Payer: Self-pay | Admitting: Family Medicine

## 2022-04-02 ENCOUNTER — Other Ambulatory Visit: Payer: Self-pay | Admitting: Family Medicine

## 2022-04-02 DIAGNOSIS — M5090 Cervical disc disorder, unspecified, unspecified cervical region: Secondary | ICD-10-CM

## 2022-04-29 DIAGNOSIS — M546 Pain in thoracic spine: Secondary | ICD-10-CM | POA: Diagnosis not present

## 2022-04-29 DIAGNOSIS — M5416 Radiculopathy, lumbar region: Secondary | ICD-10-CM | POA: Diagnosis not present

## 2022-04-29 DIAGNOSIS — Z6835 Body mass index (BMI) 35.0-35.9, adult: Secondary | ICD-10-CM | POA: Diagnosis not present

## 2022-05-01 DIAGNOSIS — G43711 Chronic migraine without aura, intractable, with status migrainosus: Secondary | ICD-10-CM | POA: Diagnosis not present

## 2022-05-01 DIAGNOSIS — M542 Cervicalgia: Secondary | ICD-10-CM | POA: Diagnosis not present

## 2022-05-01 DIAGNOSIS — Z79899 Other long term (current) drug therapy: Secondary | ICD-10-CM | POA: Diagnosis not present

## 2022-05-01 DIAGNOSIS — M546 Pain in thoracic spine: Secondary | ICD-10-CM | POA: Diagnosis not present

## 2022-05-12 ENCOUNTER — Other Ambulatory Visit: Payer: Self-pay

## 2022-05-12 MED ORDER — METOPROLOL SUCCINATE ER 25 MG PO TB24: ORAL_TABLET | ORAL | 0 refills | Status: DC

## 2022-05-13 DIAGNOSIS — M5124 Other intervertebral disc displacement, thoracic region: Secondary | ICD-10-CM | POA: Diagnosis not present

## 2022-05-13 DIAGNOSIS — M546 Pain in thoracic spine: Secondary | ICD-10-CM | POA: Diagnosis not present

## 2022-06-05 DIAGNOSIS — D3610 Benign neoplasm of peripheral nerves and autonomic nervous system, unspecified: Secondary | ICD-10-CM | POA: Diagnosis not present

## 2022-06-05 DIAGNOSIS — M5416 Radiculopathy, lumbar region: Secondary | ICD-10-CM | POA: Diagnosis not present

## 2022-06-11 ENCOUNTER — Inpatient Hospital Stay: Payer: BC Managed Care – PPO

## 2022-06-15 ENCOUNTER — Other Ambulatory Visit: Payer: Self-pay | Admitting: *Deleted

## 2022-06-15 NOTE — Patient Outreach (Signed)
  Care Coordination   06/15/2022  Name: Alejandra Barr MRN: [id number] DOB: 08-Mar-1986   Care Coordination Outreach Attempts:  An unsuccessful telephone outreach was attempted today to offer the patient information about available care coordination services as a benefit of their health plan. HIPAA compliant messages left on voicemail, providing contact information for CSW, encouraging patient to return CSW's call at her earliest convenience.  Follow Up Plan:  Additional outreach attempts will be made to offer the patient care coordination information and services.   Encounter Outcome:  No Answer.   Care Coordination Interventions Activated:  No.    Care Coordination Interventions:  No, not indicated.    Nat Christen, BSW, MSW, LCSW  Licensed Education officer, [facility]  Mailing [place] [address], [place], [place] [address] Physical Address-[address], [place], [place] [address] Toll Free Main # [phone] Fax # [phone] Cell # [phone] [email or web address]

## 2022-06-16 ENCOUNTER — Ambulatory Visit: Payer: BC Managed Care – PPO | Admitting: Family Medicine

## 2022-06-16 ENCOUNTER — Inpatient Hospital Stay: Payer: BC Managed Care – PPO | Attending: Physician Assistant

## 2022-06-16 ENCOUNTER — Encounter: Payer: Self-pay | Admitting: Family Medicine

## 2022-06-16 DIAGNOSIS — E538 Deficiency of other specified B group vitamins: Secondary | ICD-10-CM | POA: Insufficient documentation

## 2022-06-16 DIAGNOSIS — E559 Vitamin D deficiency, unspecified: Secondary | ICD-10-CM | POA: Insufficient documentation

## 2022-06-16 DIAGNOSIS — D500 Iron deficiency anemia secondary to blood loss (chronic): Secondary | ICD-10-CM

## 2022-06-16 DIAGNOSIS — D508 Other iron deficiency anemias: Secondary | ICD-10-CM

## 2022-06-16 DIAGNOSIS — D509 Iron deficiency anemia, unspecified: Secondary | ICD-10-CM | POA: Diagnosis not present

## 2022-06-16 LAB — CBC WITH DIFFERENTIAL/PLATELET
Abs Immature Granulocytes: 0.03 10*3/uL (ref 0.00–0.07)
Basophils Absolute: 0.1 10*3/uL (ref 0.0–0.1)
Basophils Relative: 1 %
Eosinophils Absolute: 0.3 10*3/uL (ref 0.0–0.5)
Eosinophils Relative: 3 %
HCT: 39.3 % (ref 36.0–46.0)
Hemoglobin: 12.6 g/dL (ref 12.0–15.0)
Immature Granulocytes: 0 %
Lymphocytes Relative: 25 %
Lymphs Abs: 1.8 10*3/uL (ref 0.7–4.0)
MCH: 29.2 pg (ref 26.0–34.0)
MCHC: 32.1 g/dL (ref 30.0–36.0)
MCV: 91 fL (ref 80.0–100.0)
Monocytes Absolute: 0.4 10*3/uL (ref 0.1–1.0)
Monocytes Relative: 6 %
Neutro Abs: 4.9 10*3/uL (ref 1.7–7.7)
Neutrophils Relative %: 65 %
Platelets: 274 10*3/uL (ref 150–400)
RBC: 4.32 MIL/uL (ref 3.87–5.11)
RDW: 13.4 % (ref 11.5–15.5)
WBC: 7.5 10*3/uL (ref 4.0–10.5)
nRBC: 0 % (ref 0.0–0.2)

## 2022-06-16 LAB — FERRITIN: Ferritin: 51 ng/mL (ref 11–307)

## 2022-06-16 LAB — FOLATE: Folate: 9.9 ng/mL (ref >5.9)

## 2022-06-16 LAB — IRON AND TIBC
Iron: 72 ug/dL (ref 28–170)
Saturation Ratios: 21 % (ref 10.4–31.8)
TIBC: 348 ug/dL (ref 250–450)
UIBC: 276 ug/dL

## 2022-06-16 LAB — VITAMIN B12: Vitamin B-12: 157 pg/mL — ABNORMAL LOW (ref 180–914)

## 2022-06-17 LAB — HOMOCYSTEINE: Homocysteine: 12.7 umol/L (ref 0.0–14.5)

## 2022-06-17 LAB — VITAMIN D 25 HYDROXY (VIT D DEFICIENCY, FRACTURES): Vit D, 25-Hydroxy: 31.53 ng/mL (ref 30–100)

## 2022-06-17 NOTE — Progress Notes (Unsigned)
[facility] [address], [place] [address]   CLINIC:  Medical Oncology/Hematology  PCP:  Kerri Perches, MD [address] [place] [address] [phone]   REASON FOR VISIT:  Follow-up for iron deficiency anemia and B12 deficiency   CURRENT THERAPY: Intermittent IV iron   INTERVAL HISTORY:  [name] 36 y.o. female returns for routine follow-up of her iron deficiency anemia and B12 deficiency.  She was last evaluated via telemedicine visit by Rojelio Brenner PA-C on 03/13/2022.   At today's visit, she reports feeling ***.    *** She continues to have menorrhagia but denies bright red blood per rectum or melena. *** She denies any current fatigue, pica, restless legs, chest pain, dyspnea on exertion, lightheadedness, or syncope. *** She has chronic migraine headaches. *** She is continuing to take her daily iron supplements, weekly folic acid supplement, weekly vitamin D supplement, and weekly B12 injections.  She has ***% energy and ***% appetite. She endorses that she is maintaining a stable weight.   REVIEW OF SYSTEMS:  ***  Review of Systems - Oncology    PAST MEDICAL/SURGICAL HISTORY:  Past Medical History:  Diagnosis Date   Asthma    Atrial flutter (HCC)    Status post RFA by Dr. 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    Iron deficiency    Iron deficiency anemia 10/02/2010   Qualifier: Diagnosis of  By: Lodema Hong MD, Margaret     Low vitamin B12 level 07/10/2013   Overview:  Last Assessment & Plan:  Being treated through hematology   Newborn product of in vitro fertilization (IVF) pregnancy    Obesity    PONV (postoperative nausea and vomiting)    Past Surgical History:  Procedure Laterality Date    Bilateral eustachian tube placement on 4 different occasions     CARDIAC ELECTROPHYSIOLOGY MAPPING AND ABLATION     CESAREAN SECTION N/A 05/26/2017   Procedure: CESAREAN SECTION;  Surgeon: Mitchel Honour, DO;  Location: [facility];  Service: Obstetrics;  Laterality: N/A;   CESAREAN SECTION N/A 03/01/2021   Procedure: Repeat CESAREAN SECTION;  Surgeon: Olivia Mackie, MD;  Location: [facility] LD ORS;  Service: Obstetrics;  Laterality: N/A;  EDD: 03/07/21   CESAREAN SECTION MULTI-GESTATIONAL N/A 02/10/2019   Procedure: Repeat CESAREAN SECTION MULTI-GESTATIONAL;  Surgeon: Olivia Mackie, MD;  Location: [facility];  Service: Obstetrics;  Laterality: N/A;  EDD: 03/15/19 Allergy: Penicillin, Morphine, Cipro   CHOLECYSTECTOMY  2003   COLONOSCOPY N/A 11/29/2013   Procedure: COLONOSCOPY;  Surgeon: Malissa Hippo, MD;  Location: [facility];  Service: Endoscopy;  Laterality: N/A;  120-rescheduled to 300 Ann notified pt   GASTRIC BYPASS  2006   TONSILLECTOMY  1990     SOCIAL HISTORY:  Social History   Socioeconomic History   Marital status: Married    Spouse name: Not on file   Number of children: Not on file   Years of education: Not on file   Highest education level: Not on file  Occupational History   Occupation: Nurse     Employer: [employer]  Tobacco Use   Smoking status: Former    Packs/day: 0.50    Types: Cigarettes    Start date:  03/21/2013    Quit date: 06/02/2015    Years since quitting: 7.0   Smokeless tobacco: Never  Vaping Use   Vaping Use: Never used  Substance and Sexual Activity   Alcohol use: No    Alcohol/week: 0.0 standard drinks of alcohol    Comment: sober alcoholic 6 years   Drug use: No   Sexual activity: Not Currently    Birth control/protection: None    Comment: husband had vasectomy  Other Topics Concern   Not on file  Social History Narrative   Not on file   Social Determinants of Health   Financial Resource Strain: Low Risk  (02/11/2021)   Overall Financial  Resource Strain (CARDIA)    Difficulty of Paying Living Expenses: Not hard at all  Food Insecurity: No Food Insecurity (02/11/2021)   Hunger Vital Sign    Worried About Running Out of Food in the Last Year: Never true    Ran Out of Food in the Last Year: Never true  Transportation Needs: No Transportation Needs (02/11/2021)   PRAPARE - Administrator, Civil Service (Medical): No    Lack of Transportation (Non-Medical): No  Physical Activity: Inactive (02/11/2021)   Exercise Vital Sign    Days of Exercise per Week: 0 days    Minutes of Exercise per Session: 0 min  Stress: No Stress Concern Present (02/11/2021)   [employer] of Occupational Health - Occupational Stress Questionnaire    Feeling of Stress : Not at all  Social Connections: Moderately Integrated (02/11/2021)   Social Connection and Isolation Panel [NHANES]    Frequency of Communication with Friends and Family: More than three times a week    Frequency of Social Gatherings with Friends and Family: More than three times a week    Attends Religious Services: 1 to 4 times per year    Active Member of [employer] or Organizations: No    Attends Banker Meetings: Never    Marital Status: Married  Catering manager Violence: Not At Risk (02/11/2021)   Humiliation, Afraid, Rape, and Kick questionnaire    Fear of Current or Ex-Partner: No    Emotionally Abused: No    Physically Abused: No    Sexually Abused: No    FAMILY HISTORY:  Family History  Problem Relation Age of Onset   Fibromyalgia Mother        Chronic pelvic pain    Hypothyroidism Mother    Obesity Mother    Heart defect Mother        Supraventricular, tachycardia /tachypalpation    Arrhythmia Mother    Breast cancer Mother 101   Hypertension Maternal Grandmother    Diabetes Maternal Grandmother    Hypertension Maternal Grandfather    Heart disease Paternal Grandmother    Hypertension Paternal Grandmother    Heart disease Paternal  Grandfather    Hypertension Paternal Grandfather     CURRENT MEDICATIONS:  Outpatient Encounter Medications as of 06/18/2022  Medication Sig Note   albuterol (VENTOLIN HFA) 108 (90 Base) MCG/ACT inhaler Inhale 2 puffs into the lungs every 6 (six) hours as needed for wheezing or shortness of breath.    Botulinum Toxin Type A (BOTOX) 200 units SOLR Botox 200 unit injection  INJECT 155 UNITS EVERY THREE MONTHS FOR CHRONIC MIGRAINES.    cyanocobalamin (,VITAMIN B-12,) 1000 MCG/ML injection Inject 1 mL (1,000 mcg total) into the muscle once a week.    DULoxetine (CYMBALTA) 30 MG capsule Take 30 mg by mouth 2 (  two) times daily.    gabapentin (NEURONTIN) 300 MG capsule Take 300 mg by mouth.    iron polysaccharides (FERREX 150) 150 MG capsule Take 1 capsule (150 mg total) by mouth daily.    levothyroxine (SYNTHROID) 75 MCG tablet Take 75 mcg by mouth daily before breakfast.    metoprolol succinate (TOPROL-XL) 25 MG 24 hr tablet TAKE 2 TABLETS DAILY (DOSE INCREASE)    nystatin ointment (MYCOSTATIN) Apply topically.    Prenatal Vit-Fe Fumarate-FA (PRENATAL PO) Take 1 tablet by mouth daily.    topiramate (TOPAMAX) 100 MG tablet Take 100 mg by mouth 2 (two) times daily.    UBRELVY 100 MG TABS Take by mouth. 06/19/2021: Hasnt started yet   Vitamin D, Ergocalciferol, (DRISDOL) 1.25 MG (50000 UNIT) CAPS capsule Take 1 capsule (50,000 Units total) by mouth once a week.    No facility-administered encounter medications on file as of 06/18/2022.    ALLERGIES:  Allergies  Allergen Reactions   Ciprofloxacin Anaphylaxis   Morphine And Related Anaphylaxis    Pt states she can tolerate hydromorphone   Penicillins Anaphylaxis    Has patient had a PCN reaction causing immediate rash, facial/tongue/throat swelling, SOB or lightheadedness with hypotension: No Has patient had a PCN reaction causing severe rash involving mucus membranes or skin necrosis: No Has patient had a PCN reaction that required  hospitalization: No Has patient had a PCN reaction occurring within the last 10 years: No If all of the above answers are "NO", then may proceed with Cephalosporin use.     PHYSICAL EXAM:  ***  ECOG PERFORMANCE STATUS: {CHL ONC ECOG PS:[id number]}  There were no vitals filed for this visit. There were no vitals filed for this visit. Physical Exam   LABORATORY DATA:  I have reviewed the labs as listed.  CBC    Component Value Date/Time   WBC 7.5 06/16/2022 1435   RBC 4.32 06/16/2022 1435   HGB 12.6 06/16/2022 1435   HCT 39.3 06/16/2022 1435   PLT 274 06/16/2022 1435   MCV 91.0 06/16/2022 1435   MCH 29.2 06/16/2022 1435   MCHC 32.1 06/16/2022 1435   RDW 13.4 06/16/2022 1435   LYMPHSABS 1.8 06/16/2022 1435   MONOABS 0.4 06/16/2022 1435   EOSABS 0.3 06/16/2022 1435   BASOSABS 0.1 06/16/2022 1435      Latest Ref Rng & Units 03/10/2022   11:48 AM 12/04/2020    9:09 AM 09/17/2020    9:27 AM  CMP  Glucose 70 - 99 mg/dL 474  89  86   BUN 6 - 20 mg/dL 11  7  12    Creatinine 0.44 - 1.00 mg/dL 2.59  5.63  8.75   Sodium 135 - 145 mmol/L 140  134  135   Potassium 3.5 - 5.1 mmol/L 4.0  4.0  3.8   Chloride 98 - 111 mmol/L 110  107  105   CO2 22 - 32 mmol/L 24  21  22    Calcium 8.9 - 10.3 mg/dL 8.9  8.4  8.9   Total Protein 6.5 - 8.1 g/dL  6.1  6.4   Total Bilirubin 0.3 - 1.2 mg/dL  0.3  0.4   Alkaline Phos 38 - 126 U/L  91  76   AST 15 - 41 U/L  10  12   ALT 0 - 44 U/L  6  9     DIAGNOSTIC IMAGING:  I have independently reviewed the relevant imaging and discussed with the patient.  ASSESSMENT & PLAN:  1.  Iron deficiency anemia: - History of gastric bypass surgery -- Venofer 300 mg x 3 from 12/18/2021 through 01/01/2022 - Iron deficiency anemia is caused by menorrhagia and malabsorption  - Symptoms improved after IV iron *** - Most recent labs (06/16/2022): Hgb 12.6/MCV 91.0, ferritin 51, iron saturation 21 % - Currently taking prenatal vitamin and ferrous sulfate 325 mg  daily. - PLAN: IV iron if symptomatic *** - Continue oral iron tablet once daily. - RTC in 3 months with repeat labs. (Patient prefers follow-up every 3 months due to sudden drops in her iron).  Prefers phone visits, but she is agreeable to annual in person visit.   2.  Vitamin B12 deficiency: - Currently taking B12 injection once weekly  ***    - Most recent B12 (06/16/2022) level low at 157, with methylmalonic acid pending - PLAN: Continue B12 injections once weekly. ***   Recheck B12/methylmalonic acid in 3 months (November 2023)   3.  Vitamin D deficiency: - Most recent vitamin D level (06/16/2022) normal at 31.53 - Currently taking ergocalciferol 50,000 units weekly   - PLAN: Continue vitamin D once a week.  Recheck vitamin D in months (February 2024)   4.  Folic acid deficiency: - Folate checked on 06/16/2022 normal at 9.9 - Currently taking folic acid 1 mg daily     - PLAN: Continue folic acid supplement.  Recheck folic acid in 6 months (February 2024)   PLAN SUMMARY & DISPOSITION: ***  All questions were answered. The patient knows to call the clinic with any problems, questions or concerns.  Medical decision making: ***  Time spent on visit: I spent *** minutes counseling the patient face to face. The total time spent in the appointment was *** minutes and more than 50% was on counseling.   Carnella Guadalajara, PA-C  ***

## 2022-06-18 ENCOUNTER — Inpatient Hospital Stay: Payer: BC Managed Care – PPO | Admitting: Physician Assistant

## 2022-06-19 ENCOUNTER — Other Ambulatory Visit: Payer: Self-pay | Admitting: *Deleted

## 2022-06-19 LAB — METHYLMALONIC ACID, SERUM: Methylmalonic Acid, Quantitative: 136 nmol/L (ref 0–378)

## 2022-06-19 NOTE — Patient Outreach (Signed)
  Care Coordination   06/19/2022  Name: Alejandra Barr MRN: [id number] DOB: [birth year]   Care Coordination Outreach Attempts:  A second unsuccessful outreach was attempted today to offer the patient with information about available care coordination services as a benefit of their health plan.   HIPAA compliant messages left on voicemail, providing contact information for CSW, encouraging patient to return CSW's call at her earliest convenience.  Follow Up Plan:  Additional outreach attempts will be made to offer the patient care coordination information and services.   Encounter Outcome:  No Answer.   Care Coordination Interventions Activated:  No.    Care Coordination Interventions:  No, not indicated.    Nat Christen, BSW, MSW, LCSW  Licensed Education officer, environmental [facility]  Mailing [place] [address], [place], [place] [address] Physical Address-[address], [place], [place] [address] Toll Free Main # [phone] Fax # [phone] Cell # [phone] [email or web address]

## 2022-06-22 NOTE — Progress Notes (Unsigned)
[place], [place] [address]   CLINIC:  Medical Oncology/Hematology  PCP:  Fayrene Helper, MD [address], [place] [address] [phone]   REASON FOR VISIT:  Follow-up for iron deficiency anemia and B12 deficiency   CURRENT THERAPY: Intermittent IV iron   INTERVAL HISTORY:  [name] 36 y.o. female returns for routine follow-up of her iron deficiency anemia and B12 deficiency.  She was last evaluated via telemedicine visit by Tarri Abernethy PA-C on 03/13/2022.   At today's visit, she reports feeling ***.    *** She continues to have menorrhagia but denies bright red blood per rectum or melena. *** She denies any current fatigue, pica, restless legs, chest pain, dyspnea on exertion, lightheadedness, or syncope. *** She has chronic migraine headaches. *** She is continuing to take her daily iron supplements, weekly folic acid supplement, weekly vitamin D supplement, and weekly B12 injections.  She has ***% energy and ***% appetite. She endorses that she is maintaining a stable weight.    REVIEW OF SYSTEMS:  Review of Systems - Oncology    PAST MEDICAL/SURGICAL HISTORY:  Past Medical History:  Diagnosis Date   Asthma    Atrial flutter ([place])    Status post RFA by Dr. 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    Iron deficiency    Iron deficiency anemia 10/02/2010   Qualifier: Diagnosis of  By: Moshe Cipro MD, Margaret     Low vitamin B12 level 07/10/2013   Overview:  Last Assessment & Plan:  Being treated through hematology   Newborn product of in vitro fertilization (IVF) pregnancy    Obesity    PONV (postoperative nausea and vomiting)    Past Surgical History:  Procedure Laterality Date    Bilateral eustachian tube placement on 4 different occasions     CARDIAC ELECTROPHYSIOLOGY MAPPING AND ABLATION     CESAREAN SECTION N/A 05/26/2017   Procedure: CESAREAN SECTION;  Surgeon: Linda Hedges, DO;  Location: [facility];  Service: Obstetrics;  Laterality: N/A;   CESAREAN SECTION N/A 03/01/2021   Procedure: Repeat CESAREAN SECTION;  Surgeon: Brien Few, MD;  Location: [facility];  Service: Obstetrics;  Laterality: N/A;  EDD: 03/07/21   CESAREAN SECTION MULTI-GESTATIONAL N/A 02/10/2019   Procedure: Repeat CESAREAN SECTION MULTI-GESTATIONAL;  Surgeon: Brien Few, MD;  Location: [facility];  Service: Obstetrics;  Laterality: N/A;  EDD: 03/15/19 Allergy: Penicillin, Morphine, Cipro   CHOLECYSTECTOMY  2003   COLONOSCOPY N/A 11/29/2013   Procedure: COLONOSCOPY;  Surgeon: Rogene Houston, MD;  [facility];  Service: Endoscopy;  Laterality: N/A;  120-rescheduled to 300 Ann notified pt   GASTRIC BYPASS  2006   TONSILLECTOMY  1990     SOCIAL HISTORY:  Social History   Socioeconomic History   Marital status: Married    Spouse name: Not on file   Number of children: Not on file   Years of education: Not on file   Highest education level: Not on file  Occupational History   Occupation: Nurse     Employer: [employer]  Tobacco Use   Smoking status: Former    Packs/day: 0.50    Types: Cigarettes    Start date: 03/21/2013  Quit date: 06/02/2015    Years since quitting: 7.0   Smokeless tobacco: Never  Vaping Use   Vaping Use: Never used  Substance and Sexual Activity   Alcohol use: No    Alcohol/week: 0.0 standard drinks of alcohol    Comment: sober alcoholic 6 years   Drug use: No   Sexual activity: Not Currently    Birth control/protection: None    Comment: husband had vasectomy  Other Topics Concern   Not on file  Social History Narrative   Not on file   Social Determinants of Health   Financial Resource Strain: Low Risk  (02/11/2021)   Overall Financial  Resource Strain (CARDIA)    Difficulty of Paying Living Expenses: Not hard at all  Food Insecurity: No Food Insecurity (02/11/2021)   Hunger Vital Sign    Worried About Running Out of Food in the Last Year: Never true    Ran Out of Food in the Last Year: Never true  Transportation Needs: No Transportation Needs (02/11/2021)   PRAPARE - Hydrologist (Medical): No    Lack of Transportation (Non-Medical): No  Physical Activity: Inactive (02/11/2021)   Exercise Vital Sign    Days of Exercise per Week: 0 days    Minutes of Exercise per Session: 0 min  Stress: No Stress Concern Present (02/11/2021)   [place]    Feeling of Stress : Not at all  Social Connections: Moderately Integrated (02/11/2021)   Social Connection and Isolation Panel [NHANES]    Frequency of Communication with Friends and Family: More than three times a week    Frequency of Social Gatherings with Friends and Family: More than three times a week    Attends Religious Services: 1 to 4 times per year    Active Member of [employer] or Organizations: No    Attends Archivist Meetings: Never    Marital Status: Married  Human resources officer Violence: Not At Risk (02/11/2021)   Humiliation, Afraid, Rape, and Kick questionnaire    Fear of Current or Ex-Partner: No    Emotionally Abused: No    Physically Abused: No    Sexually Abused: No    FAMILY HISTORY:  Family History  Problem Relation Age of Onset   Fibromyalgia Mother        Chronic pelvic pain    Hypothyroidism Mother    Obesity Mother    Heart defect Mother        Supraventricular, tachycardia /tachypalpation    Arrhythmia Mother    Breast cancer Mother 10   Hypertension Maternal Grandmother    Diabetes Maternal Grandmother    Hypertension Maternal Grandfather    Heart disease Paternal Grandmother    Hypertension Paternal Grandmother    Heart disease Paternal  Grandfather    Hypertension Paternal Grandfather     CURRENT MEDICATIONS:  Outpatient Encounter Medications as of 06/23/2022  Medication Sig Note   albuterol (VENTOLIN HFA) 108 (90 Base) MCG/ACT inhaler Inhale 2 puffs into the lungs every 6 (six) hours as needed for wheezing or shortness of breath.    Botulinum Toxin Type A (BOTOX) 200 units SOLR Botox 200 unit injection  INJECT 155 UNITS EVERY THREE MONTHS FOR CHRONIC MIGRAINES.    cyanocobalamin (,VITAMIN B-12,) 1000 MCG/ML injection Inject 1 mL (1,000 mcg total) into the muscle once a week.    DULoxetine (CYMBALTA) 30 MG capsule Take 30 mg by mouth 2 (two) times daily.  gabapentin (NEURONTIN) 300 MG capsule Take 300 mg by mouth.    iron polysaccharides (FERREX 150) 150 MG capsule Take 1 capsule (150 mg total) by mouth daily.    levothyroxine (SYNTHROID) 75 MCG tablet Take 75 mcg by mouth daily before breakfast.    metoprolol succinate (TOPROL-XL) 25 MG 24 hr tablet TAKE 2 TABLETS DAILY (DOSE INCREASE)    nystatin ointment (MYCOSTATIN) Apply topically.    Prenatal Vit-Fe Fumarate-FA (PRENATAL PO) Take 1 tablet by mouth daily.    topiramate (TOPAMAX) 100 MG tablet Take 100 mg by mouth 2 (two) times daily.    UBRELVY 100 MG TABS Take by mouth. 06/19/2021: Hasnt started yet   Vitamin D, Ergocalciferol, (DRISDOL) 1.25 MG (50000 UNIT) CAPS capsule Take 1 capsule (50,000 Units total) by mouth once a week.    No facility-administered encounter medications on file as of 06/23/2022.    ALLERGIES:  Allergies  Allergen Reactions   Ciprofloxacin Anaphylaxis   Morphine And Related Anaphylaxis    Pt states she can tolerate hydromorphone   Penicillins Anaphylaxis    Has patient had a PCN reaction causing immediate rash, facial/tongue/throat swelling, SOB or lightheadedness with hypotension: No Has patient had a PCN reaction causing severe rash involving mucus membranes or skin necrosis: No Has patient had a PCN reaction that required  hospitalization: No Has patient had a PCN reaction occurring within the last 10 years: No If all of the above answers are "NO", then may proceed with Cephalosporin use.     PHYSICAL EXAM:  ECOG PERFORMANCE STATUS: {CHL ONC ECOG PS:[id number]}  There were no vitals filed for this visit. There were no vitals filed for this visit. Physical Exam   LABORATORY DATA:  I have reviewed the labs as listed.  CBC    Component Value Date/Time   WBC 7.5 06/16/2022 1435   RBC 4.32 06/16/2022 1435   HGB 12.6 06/16/2022 1435   HCT 39.3 06/16/2022 1435   PLT 274 06/16/2022 1435   MCV 91.0 06/16/2022 1435   MCH 29.2 06/16/2022 1435   MCHC 32.1 06/16/2022 1435   RDW 13.4 06/16/2022 1435   LYMPHSABS 1.8 06/16/2022 1435   MONOABS 0.4 06/16/2022 1435   EOSABS 0.3 06/16/2022 1435   BASOSABS 0.1 06/16/2022 1435      Latest Ref Rng & Units 03/10/2022   11:48 AM 12/04/2020    9:09 AM 09/17/2020    9:27 AM  CMP  Glucose 70 - 99 mg/dL 102  89  86   BUN 6 - 20 mg/dL '11  7  12   '$ Creatinine 0.44 - 1.00 mg/dL 0.71  0.55  0.61   Sodium 135 - 145 mmol/L 140  134  135   Potassium 3.5 - 5.1 mmol/L 4.0  4.0  3.8   Chloride 98 - 111 mmol/L 110  107  105   CO2 22 - 32 mmol/L '24  21  22   '$ Calcium 8.9 - 10.3 mg/dL 8.9  8.4  8.9   Total Protein 6.5 - 8.1 g/dL  6.1  6.4   Total Bilirubin 0.3 - 1.2 mg/dL  0.3  0.4   Alkaline Phos 38 - 126 U/L  91  76   AST 15 - 41 U/L  10  12   ALT 0 - 44 U/L  6  9     DIAGNOSTIC IMAGING:  I have independently reviewed the relevant imaging and discussed with the patient.  ASSESSMENT & PLAN: 1.  Iron deficiency anemia: - History of  gastric bypass surgery -- Venofer 300 mg x 3 from 12/18/2021 through 01/01/2022 - Iron deficiency anemia is caused by menorrhagia and malabsorption  - Symptoms improved after IV iron *** - Most recent labs (06/16/2022): Hgb 12.6/MCV 91.0, ferritin 51, iron saturation 21 % - Currently taking prenatal vitamin and ferrous sulfate 325 mg daily. -  PLAN: IV iron if symptomatic *** - Continue oral iron tablet once daily. - RTC in 3 months with repeat labs. (Patient prefers follow-up every 3 months due to sudden drops in her iron).  Prefers phone visits, but she is agreeable to annual in person visit.   2.  Vitamin B12 deficiency: - Currently taking B12 injection once weekly  ***    - Most recent B12 (06/16/2022) level low at 157, with methylmalonic acid pending - PLAN: Continue B12 injections once weekly. ***   Recheck B12/methylmalonic acid in 3 months (November 2023)   3.  Vitamin D deficiency: - Most recent vitamin D level (06/16/2022) normal at 31.53 - Currently taking ergocalciferol 50,000 units weekly   - PLAN: Continue vitamin D once a week.  Recheck vitamin D in months (February 2024)   4.  Folic acid deficiency: - Folate checked on 06/16/2022 normal at 9.9 - Currently taking folic acid 1 mg daily     - PLAN: Continue folic acid supplement.  Recheck folic acid in 6 months (February 2024)  PLAN SUMMARY & DISPOSITION: ***  All questions were answered. The patient knows to call the clinic with any problems, questions or concerns.  Medical decision making: ***  Time spent on visit: I spent *** minutes counseling the patient face to face. The total time spent in the appointment was *** minutes and more than 50% was on counseling.   Harriett Rush, PA-C  ***

## 2022-06-23 ENCOUNTER — Other Ambulatory Visit: Payer: Self-pay | Admitting: *Deleted

## 2022-06-23 ENCOUNTER — Inpatient Hospital Stay (HOSPITAL_BASED_OUTPATIENT_CLINIC_OR_DEPARTMENT_OTHER): Payer: BC Managed Care – PPO | Admitting: Physician Assistant

## 2022-06-23 VITALS — BP 104/45 | HR 90 | Temp 97.9°F | Resp 18 | Ht 61.0 in | Wt 209.0 lb

## 2022-06-23 DIAGNOSIS — E538 Deficiency of other specified B group vitamins: Secondary | ICD-10-CM

## 2022-06-23 DIAGNOSIS — D509 Iron deficiency anemia, unspecified: Secondary | ICD-10-CM | POA: Diagnosis not present

## 2022-06-23 DIAGNOSIS — D500 Iron deficiency anemia secondary to blood loss (chronic): Secondary | ICD-10-CM | POA: Diagnosis not present

## 2022-06-23 DIAGNOSIS — E559 Vitamin D deficiency, unspecified: Secondary | ICD-10-CM

## 2022-06-23 MED ORDER — NEEDLE (DISP) 25G X 1-1/2" MISC
0 refills | Status: DC
Start: 2022-06-23 — End: 2022-12-21

## 2022-06-23 NOTE — Patient Instructions (Signed)
[facility] **   You were seen today by Tarri Abernethy PA-C for your iron deficiency anemia and B12 deficiency.    IRON DEFICIENCY ANEMIA: We will schedule you for IV iron x3 doses. Continue daily iron tablet.  OTHER NUTRIENT DEFICIENCIES: Vitamin B12: B12 levels are low, which is likely due to missing injections for the past month.  We will write refill prescription for injections. Vitamin D: Currently normal.  Continue 50,000 units vitamin D weekly. Folic acid: Currently normal.  Continue folic acid 1 mg daily.  LABS: Return in 3 months for repeat labs  FOLLOW-UP APPOINTMENT: Phone visit after labs  ** Thank you for trusting me with your healthcare!  I strive to provide all of my patients with quality care at each visit.  If you receive a survey for this visit, I would be so grateful to you for taking the time to provide feedback.  Thank you in advance!  ~ Larkin Morelos                   Dr. Derek Jack   &   Tarri Abernethy, PA-C   - - - - - - - - - - - - - - - - - -    Thank you for choosing [facility] to provide your oncology and hematology care.  To afford each patient quality time with our provider, please arrive at least 15 minutes before your scheduled appointment time.   If you have a lab appointment with the [facility] please come in thru the Main Entrance and check in at the main information desk.  You need to re-schedule your appointment should you arrive 10 or more minutes late.  We strive to give you quality time with our providers, and arriving late affects you and other patients whose appointments are after yours.  Also, if you no show three or more times for appointments you may be dismissed from the clinic at the providers discretion.     Again, thank you for choosing [facility].  Our hope is that these requests will decrease  the amount of time that you wait before being seen by our physicians.       _____________________________________________________________  Should you have questions after your visit to [facility], please contact our office at [phone] and follow the prompts.  Our office hours are 8:00 a.m. and 4:30 p.m. Monday - Friday.  Please note that voicemails left after 4:00 p.m. may not be returned until the following business day.  We are closed weekends and major holidays.  You do have access to a nurse 24-7, just call the main number to the clinic [phone] and do not press any options, hold on the line and a nurse will answer the phone.    For prescription refill requests, have your pharmacy contact our office and allow 72 hours.

## 2022-06-25 ENCOUNTER — Other Ambulatory Visit: Payer: Self-pay | Admitting: *Deleted

## 2022-06-25 NOTE — Patient Outreach (Signed)
  Care Coordination   06/25/2022  Name: Alejandra Barr MRN: [id number] DOB: Oct 15, 1986   Care Coordination Outreach Attempts:  A third unsuccessful outreach was attempted today to offer the patient with information about available care coordination services as a benefit of their health plan. HIPAA compliant message left on voicemail, providing contact information for CSW, encouraging patient to return CSW's cal at her earliest convenience.  Follow Up Plan:  No further outreach attempts will be made at this time. We have been unable to contact the patient to offer or enroll patient in care coordination services.  Encounter Outcome:  No Answer.   Care Coordination Interventions Activated:  No.    Care Coordination Interventions:  No, not indicated.    Nat Christen, BSW, MSW, LCSW  Licensed Education officer, [facility]  Mailing [place] [address] [place], [place] [address] Physical Address-[address], [place], [place] [address] Toll Free Main # [phone] Fax # [phone] Cell # [phone] [email or web address]

## 2022-06-26 ENCOUNTER — Ambulatory Visit (INDEPENDENT_AMBULATORY_CARE_PROVIDER_SITE_OTHER): Payer: BC Managed Care – PPO | Admitting: Family Medicine

## 2022-06-26 ENCOUNTER — Encounter: Payer: Self-pay | Admitting: Family Medicine

## 2022-06-26 VITALS — BP 114/76 | HR 96 | Ht 61.0 in | Wt 212.0 lb

## 2022-06-26 DIAGNOSIS — Z23 Encounter for immunization: Secondary | ICD-10-CM | POA: Diagnosis not present

## 2022-06-26 DIAGNOSIS — F411 Generalized anxiety disorder: Secondary | ICD-10-CM | POA: Diagnosis not present

## 2022-06-26 DIAGNOSIS — M5416 Radiculopathy, lumbar region: Secondary | ICD-10-CM

## 2022-06-26 DIAGNOSIS — F322 Major depressive disorder, single episode, severe without psychotic features: Secondary | ICD-10-CM

## 2022-06-26 MED ORDER — PHENTERMINE HCL 37.5 MG PO TABS: 37.5000 mg | ORAL_TABLET | Freq: Every day | ORAL | 1 refills | Status: DC

## 2022-06-26 NOTE — Patient Instructions (Signed)
F/U in 7 weeks, call if you need me sooner  Flu vaccine today  Phentermine one daily and please clean up food environment at home  Hoping for better pain control and improved mobility  Nurse please give info on grief therapy at [employer] [facility] [employer] are referred to Psychiatry  Thanks for choosing [facility], we consider it a privelige to serve you.

## 2022-06-28 ENCOUNTER — Encounter: Payer: Self-pay | Admitting: Family Medicine

## 2022-06-28 DIAGNOSIS — F322 Major depressive disorder, single episode, severe without psychotic features: Secondary | ICD-10-CM | POA: Insufficient documentation

## 2022-06-28 NOTE — Assessment & Plan Note (Signed)
Refer psych, long complicated psych history

## 2022-06-28 NOTE — Progress Notes (Signed)
   Alejandra Barr     MRN: [id number]      DOB: 1985-12-27   HPI Alejandra Barr is here for follow up and re-evaluation of chronic medical conditions, medication management and review of any available recent lab and radiology data.  Preventive health is updated, specifically  Cancer screening and Immunization.   Questions or concerns regarding consultations or procedures which the PT has had in the interim are  addressed.Trying steroid injection for lumbar pain which is debilitating Wants to stop going to weight loss clinic as costly and not effective Depressed, overwhelmed, limitations in her mobility due to pain and the weight regain which she is unable to shake have her depressed even more so after losing her grandmother last fall   ROS Denies recent fever or chills. Denies sinus pressure, nasal congestion, ear pain or sore throat. Denies chest congestion, productive cough or wheezing. Denies chest pains, palpitations and leg swelling Denies abdominal pain, nausea, vomiting,diarrhea or constipation.   Denies dysuria, frequency, hesitancy or incontinence. . Denies skin break down or rash.   PE  BP 114/76 (BP Location: Right Arm, Patient Position: Sitting, Cuff Size: Large)   Pulse 96   Ht '5\' 1"'$  (1.549 m)   Wt 212 lb 0.6 oz (96.2 kg)   SpO2 98%   BMI 40.06 kg/m   Patient alert and oriented and in no cardiopulmonary distress.  HEENT: No facial asymmetry, EOMI,     Neck supple .  Chest: Clear to auscultation bilaterally.  CVS: S1, S2 no murmurs, no S3.Regular rate.  ABD: Soft non tender.   Ext: No edema  MS: decreased ROM lumbar spine, adequate in shoulders, hips and knees.  Skin: Intact, no ulcerations or rash noted.  Psych: Good eye contact, normal affect. Memory intact not anxious or depressed appearing.  CNS: CN 2-12 intact, power,  normal throughout.no focal deficits noted.   Assessment & Plan  GAD (generalized anxiety disorder) Refer psych, long complicated  psych history  Depression, major, single episode, severe ([place]) Not suicidal or homicidal but uncontrolled on high med doses, refer psych  Lumbar back pain with radiculopathy affecting right lower extremity Will strt epidural injections,satesif surgery indicated , she is ready, debilitating and depressing  Morbid obesity ([place])  Patient re-educated about  the importance of commitment to a  minimum of 150 minutes of exercise per week as able.  The importance of healthy food choices with portion control discussed, as well as eating regularly and within a 12 hour window most days. The need to choose "clean , green" food 50 to 75% of the time is discussed, as well as to make water the primary drink and set a goal of 64 ounces water daily.       06/26/2022    4:08 PM 06/23/2022    8:15 AM 03/05/2022    3:00 PM  Weight /BMI  Weight 212 lb 0.6 oz 209 lb 207 lb  Height '5\' 1"'$  (1.549 m) '5\' 1"'$  (1.549 m) '5\' 1"'$  (1.549 m)  BMI 40.06 kg/m2 39.49 kg/m2 39.11 kg/m2    Lower dose phentermine, food diary and clean up home environ

## 2022-06-28 NOTE — Assessment & Plan Note (Signed)
Will strt epidural injections,satesif surgery indicated , she is ready, debilitating and depressing

## 2022-06-28 NOTE — Assessment & Plan Note (Signed)
Not suicidal or homicidal but uncontrolled on high med doses, refer psych

## 2022-06-28 NOTE — Assessment & Plan Note (Signed)
  Patient re-educated about  the importance of commitment to a  minimum of 150 minutes of exercise per week as able.  The importance of healthy food choices with portion control discussed, as well as eating regularly and within a 12 hour window most days. The need to choose "clean , green" food 50 to 75% of the time is discussed, as well as to make water the primary drink and set a goal of 64 ounces water daily.       06/26/2022    4:08 PM 06/23/2022    8:15 AM 03/05/2022    3:00 PM  Weight /BMI  Weight 212 lb 0.6 oz 209 lb 207 lb  Height '5\' 1"'$  (1.549 m) '5\' 1"'$  (1.549 m) '5\' 1"'$  (1.549 m)  BMI 40.06 kg/m2 39.49 kg/m2 39.11 kg/m2    Lower dose phentermine, food diary and clean up home environ

## 2022-07-02 DIAGNOSIS — M5416 Radiculopathy, lumbar region: Secondary | ICD-10-CM | POA: Diagnosis not present

## 2022-07-03 ENCOUNTER — Inpatient Hospital Stay: Payer: BC Managed Care – PPO | Attending: Hematology

## 2022-07-03 VITALS — BP 111/56 | HR 78 | Temp 98.4°F | Resp 18

## 2022-07-03 DIAGNOSIS — D509 Iron deficiency anemia, unspecified: Secondary | ICD-10-CM | POA: Diagnosis not present

## 2022-07-03 DIAGNOSIS — D508 Other iron deficiency anemias: Secondary | ICD-10-CM

## 2022-07-03 MED ORDER — SODIUM CHLORIDE 0.9 % IV SOLN
300.0000 mg | Freq: Once | INTRAVENOUS | Status: AC
  Administered 2022-07-03: 300 mg via INTRAVENOUS
  Filled 2022-07-03: qty 300

## 2022-07-03 MED ORDER — LORATADINE 10 MG PO TABS
10.0000 mg | ORAL_TABLET | Freq: Once | ORAL | Status: AC
  Administered 2022-07-03: 10 mg via ORAL
  Filled 2022-07-03: qty 1

## 2022-07-03 MED ORDER — SODIUM CHLORIDE 0.9 % IV SOLN: Freq: Once | INTRAVENOUS | Status: AC

## 2022-07-03 MED ORDER — ACETAMINOPHEN 325 MG PO TABS
650.0000 mg | ORAL_TABLET | Freq: Once | ORAL | Status: AC
  Administered 2022-07-03: 650 mg via ORAL
  Filled 2022-07-03: qty 2

## 2022-07-03 NOTE — Progress Notes (Signed)
Pt presents today for Venofer IV iron infusion per provider's order. Vital signs stable and pt voiced no new complaints at this time.  Peripheral IV started with good blood return pre and post infusion.  Venofer 300 mg  given today per MD orders. Tolerated infusion without adverse affects. Vital signs stable. No complaints at this time. Discharged from clinic ambulatory in stable condition. Alert and oriented x 3. F/U with [facility] as scheduled.   

## 2022-07-03 NOTE — Patient Instructions (Signed)
[facility]  Discharge Instructions: Thank you for choosing [facility] to provide your oncology and hematology care.  If you have a lab appointment with the Cancer Center, please come in thru the Main Entrance and check in at the main information desk.  Wear comfortable clothing and clothing appropriate for easy access to any Portacath or PICC line.   We strive to give you quality time with your provider. You may need to reschedule your appointment if you arrive late (15 or more minutes).  Arriving late affects you and other patients whose appointments are after yours.  Also, if you miss three or more appointments without notifying the office, you may be dismissed from the clinic at the provider's discretion.      For prescription refill requests, have your pharmacy contact our office and allow 72 hours for refills to be completed.    Today you received Venofer IV iron.   BELOW ARE SYMPTOMS THAT SHOULD BE REPORTED IMMEDIATELY: *FEVER GREATER THAN 100.4 F (38 C) OR HIGHER *CHILLS OR SWEATING *NAUSEA AND VOMITING THAT IS NOT CONTROLLED WITH YOUR NAUSEA MEDICATION *UNUSUAL SHORTNESS OF BREATH *UNUSUAL BRUISING OR BLEEDING *URINARY PROBLEMS (pain or burning when urinating, or frequent urination) *BOWEL PROBLEMS (unusual diarrhea, constipation, pain near the anus) TENDERNESS IN MOUTH AND THROAT WITH OR WITHOUT PRESENCE OF ULCERS (sore throat, sores in mouth, or a toothache) UNUSUAL RASH, SWELLING OR PAIN  UNUSUAL VAGINAL DISCHARGE OR ITCHING   Items with * indicate a potential emergency and should be followed up as soon as possible or go to the Emergency Department if any problems should occur.  Please show the CHEMOTHERAPY ALERT CARD or IMMUNOTHERAPY ALERT CARD at check-in to the Emergency Department and triage nurse.  Should you have questions after your visit or need to cancel or reschedule your appointment, please contact [facility] [phone]  and follow the prompts.  Office hours are 8:00 a.m. to 4:30 p.m. Monday - Friday. Please note that voicemails left after 4:00 p.m. may not be returned until the following business day.  We are closed weekends and major holidays. You have access to a nurse at all times for urgent questions. Please call the main number to the clinic [phone] and follow the prompts.  For any non-urgent questions, you may also contact your provider using MyChart. We now offer e-Visits for anyone 18 and older to request care online for non-urgent symptoms. For details visit [email or web address].   Also download the MyChart app! Go to the app store, search "MyChart", open the app, select [facility], and log in with your MyChart username and password.  Masks are optional in the cancer centers. If you would like for your care team to wear a mask while they are taking care of you, please let them know. You may have one support person who is at least 37 years old accompany you for your appointments.  

## 2022-07-10 ENCOUNTER — Inpatient Hospital Stay: Payer: BC Managed Care – PPO

## 2022-07-17 ENCOUNTER — Inpatient Hospital Stay: Payer: BC Managed Care – PPO

## 2022-07-17 VITALS — BP 109/71 | HR 86 | Temp 98.2°F | Resp 18

## 2022-07-17 DIAGNOSIS — D508 Other iron deficiency anemias: Secondary | ICD-10-CM

## 2022-07-17 DIAGNOSIS — D509 Iron deficiency anemia, unspecified: Secondary | ICD-10-CM | POA: Diagnosis not present

## 2022-07-17 MED ORDER — SODIUM CHLORIDE 0.9 % IV SOLN
300.0000 mg | Freq: Once | INTRAVENOUS | Status: AC
  Administered 2022-07-17: 300 mg via INTRAVENOUS
  Filled 2022-07-17: qty 300

## 2022-07-17 MED ORDER — SODIUM CHLORIDE 0.9 % IV SOLN: Freq: Once | INTRAVENOUS | Status: AC

## 2022-07-17 NOTE — Patient Instructions (Signed)
[facility]  Discharge Instructions: Thank you for choosing [facility] to provide your oncology and hematology care.  If you have a lab appointment with the Cancer Center, please come in thru the Main Entrance and check in at the main information desk.  Wear comfortable clothing and clothing appropriate for easy access to any Portacath or PICC line.   We strive to give you quality time with your provider. You may need to reschedule your appointment if you arrive late (15 or more minutes).  Arriving late affects you and other patients whose appointments are after yours.  Also, if you miss three or more appointments without notifying the office, you may be dismissed from the clinic at the provider's discretion.      For prescription refill requests, have your pharmacy contact our office and allow 72 hours for refills to be completed.    Today you received the following chemotherapy and/or immunotherapy agents Venofer      To help prevent nausea and vomiting after your treatment, we encourage you to take your nausea medication as directed.  BELOW ARE SYMPTOMS THAT SHOULD BE REPORTED IMMEDIATELY: *FEVER GREATER THAN 100.4 F (38 C) OR HIGHER *CHILLS OR SWEATING *NAUSEA AND VOMITING THAT IS NOT CONTROLLED WITH YOUR NAUSEA MEDICATION *UNUSUAL SHORTNESS OF BREATH *UNUSUAL BRUISING OR BLEEDING *URINARY PROBLEMS (pain or burning when urinating, or frequent urination) *BOWEL PROBLEMS (unusual diarrhea, constipation, pain near the anus) TENDERNESS IN MOUTH AND THROAT WITH OR WITHOUT PRESENCE OF ULCERS (sore throat, sores in mouth, or a toothache) UNUSUAL RASH, SWELLING OR PAIN  UNUSUAL VAGINAL DISCHARGE OR ITCHING   Items with * indicate a potential emergency and should be followed up as soon as possible or go to the Emergency Department if any problems should occur.  Please show the CHEMOTHERAPY ALERT CARD or IMMUNOTHERAPY ALERT CARD at check-in to the Emergency  Department and triage nurse.  Should you have questions after your visit or need to cancel or reschedule your appointment, please contact [facility] [place] [phone]  and follow the prompts.  Office hours are 8:00 a.m. to 4:30 p.m. Monday - Friday. Please note that voicemails left after 4:00 p.m. may not be returned until the following business day.  We are closed weekends and major holidays. You have access to a nurse at all times for urgent questions. Please call the main number to the clinic [phone] and follow the prompts.  For any non-urgent questions, you may also contact your provider using MyChart. We now offer e-Visits for anyone 18 and older to request care online for non-urgent symptoms. For details visit [email or web address].   Also download the MyChart app! Go to the app store, search "MyChart", open the app, select [place], and log in with your MyChart username and password.  Masks are optional in the cancer centers. If you would like for your care team to wear a mask while they are taking care of you, please let them know. You may have one support person who is at least 37 years old accompany you for your appointments.  

## 2022-07-17 NOTE — Progress Notes (Signed)
Patient presents today for Venofer infusion per providers order.  Vital signs WNL.  Patient took premedications at home prior to visit.  No new complaints at this time.  Venofer given today per MD orders.  Stable during infusion without adverse affects.  Vital signs stable.  No complaints at this time.  Discharge from clinic ambulatory in stable condition.  Alert and oriented X 3.  Follow up with [facility] as scheduled.

## 2022-08-03 ENCOUNTER — Inpatient Hospital Stay: Payer: BC Managed Care – PPO | Attending: Hematology

## 2022-08-03 VITALS — BP 117/74 | HR 83 | Temp 98.2°F | Resp 18

## 2022-08-03 DIAGNOSIS — D509 Iron deficiency anemia, unspecified: Secondary | ICD-10-CM | POA: Diagnosis not present

## 2022-08-03 DIAGNOSIS — D508 Other iron deficiency anemias: Secondary | ICD-10-CM

## 2022-08-03 MED ORDER — SODIUM CHLORIDE 0.9 % IV SOLN
300.0000 mg | Freq: Once | INTRAVENOUS | Status: AC
  Administered 2022-08-03: 300 mg via INTRAVENOUS
  Filled 2022-08-03: qty 10

## 2022-08-03 MED ORDER — SODIUM CHLORIDE 0.9 % IV SOLN: Freq: Once | INTRAVENOUS | Status: AC

## 2022-08-03 NOTE — Progress Notes (Signed)
Patient presents today for Venofer, patient reports taking tylenol and Claritin at home. Patient tolerated iron infusion with no complaints voiced. Patient refuses to wait 30 minute wait recommended wait time.Peripheral IV site clean and dry with good blood return noted before and after infusion. Band aid applied. VSS with discharge and left in satisfactory condition with no s/s of distress noted.

## 2022-08-03 NOTE — Patient Instructions (Signed)
[facility] [place]  Discharge Instructions: Thank you for choosing [facility] to provide your oncology and hematology care.  If you have a lab appointment with the Cancer Center, please come in thru the Main Entrance and check in at the main information desk.  Wear comfortable clothing and clothing appropriate for easy access to any Portacath or PICC line.   We strive to give you quality time with your provider. You may need to reschedule your appointment if you arrive late (15 or more minutes).  Arriving late affects you and other patients whose appointments are after yours.  Also, if you miss three or more appointments without notifying the office, you may be dismissed from the clinic at the provider's discretion.      For prescription refill requests, have your pharmacy contact our office and allow 72 hours for refills to be completed.    Today you received the following Venofer, return as scheduled.   To help prevent nausea and vomiting after your treatment, we encourage you to take your nausea medication as directed.  BELOW ARE SYMPTOMS THAT SHOULD BE REPORTED IMMEDIATELY: *FEVER GREATER THAN 100.4 F (38 C) OR HIGHER *CHILLS OR SWEATING *NAUSEA AND VOMITING THAT IS NOT CONTROLLED WITH YOUR NAUSEA MEDICATION *UNUSUAL SHORTNESS OF BREATH *UNUSUAL BRUISING OR BLEEDING *URINARY PROBLEMS (pain or burning when urinating, or frequent urination) *BOWEL PROBLEMS (unusual diarrhea, constipation, pain near the anus) TENDERNESS IN MOUTH AND THROAT WITH OR WITHOUT PRESENCE OF ULCERS (sore throat, sores in mouth, or a toothache) UNUSUAL RASH, SWELLING OR PAIN  UNUSUAL VAGINAL DISCHARGE OR ITCHING   Items with * indicate a potential emergency and should be followed up as soon as possible or go to the Emergency Department if any problems should occur.  Please show the CHEMOTHERAPY ALERT CARD or IMMUNOTHERAPY ALERT CARD at check-in to the Emergency Department and triage  nurse.  Should you have questions after your visit or need to cancel or reschedule your appointment, please contact [facility] [place] [phone]  and follow the prompts.  Office hours are 8:00 a.m. to 4:30 p.m. Monday - Friday. Please note that voicemails left after 4:00 p.m. may not be returned until the following business day.  We are closed weekends and major holidays. You have access to a nurse at all times for urgent questions. Please call the main number to the clinic [phone] and follow the prompts.  For any non-urgent questions, you may also contact your provider using MyChart. We now offer e-Visits for anyone 18 and older to request care online for non-urgent symptoms. For details visit [email or web address].   Also download the MyChart app! Go to the app store, search "MyChart", open the app, select [place], and log in with your MyChart username and password.  Masks are optional in the cancer centers. If you would like for your care team to wear a mask while they are taking care of you, please let them know. You may have one support person who is at least 37 years old accompany you for your appointments.  

## 2022-08-07 DIAGNOSIS — D3610 Benign neoplasm of peripheral nerves and autonomic nervous system, unspecified: Secondary | ICD-10-CM | POA: Diagnosis not present

## 2022-08-07 DIAGNOSIS — Z6839 Body mass index (BMI) 39.0-39.9, adult: Secondary | ICD-10-CM | POA: Diagnosis not present

## 2022-08-14 ENCOUNTER — Encounter: Payer: Self-pay | Admitting: Family Medicine

## 2022-08-14 ENCOUNTER — Ambulatory Visit (INDEPENDENT_AMBULATORY_CARE_PROVIDER_SITE_OTHER): Payer: BC Managed Care – PPO | Admitting: Family Medicine

## 2022-08-14 VITALS — BP 114/69 | HR 99 | Ht 61.0 in | Wt 202.0 lb

## 2022-08-14 DIAGNOSIS — G8929 Other chronic pain: Secondary | ICD-10-CM

## 2022-08-14 DIAGNOSIS — I4892 Unspecified atrial flutter: Secondary | ICD-10-CM

## 2022-08-14 DIAGNOSIS — E039 Hypothyroidism, unspecified: Secondary | ICD-10-CM | POA: Diagnosis not present

## 2022-08-14 DIAGNOSIS — M5416 Radiculopathy, lumbar region: Secondary | ICD-10-CM

## 2022-08-14 DIAGNOSIS — R519 Headache, unspecified: Secondary | ICD-10-CM

## 2022-08-14 DIAGNOSIS — E559 Vitamin D deficiency, unspecified: Secondary | ICD-10-CM | POA: Diagnosis not present

## 2022-08-14 DIAGNOSIS — Z9889 Other specified postprocedural states: Secondary | ICD-10-CM

## 2022-08-14 DIAGNOSIS — Z01810 Encounter for preprocedural cardiovascular examination: Secondary | ICD-10-CM

## 2022-08-14 MED ORDER — PHENTERMINE HCL 37.5 MG PO TABS: 37.5000 mg | ORAL_TABLET | Freq: Every day | ORAL | 2 refills | Status: DC

## 2022-08-14 NOTE — Patient Instructions (Signed)
Follow-up in 3 months call if you need me sooner.  Congrats on excellent weight loss of 5 pounds per month, keep this up.  All the best with upcoming spine surgery.  I am referring you to cardiology for evaluation of palpitations and clearance in anticipation of surgery.  TSH Chem-7 and EGFR today.  Fasting lipid, Vit D, hBA1C and hepatic panel 3 days before next visit in january  Please send [place] a message as soon as possible with the name of the neurologist or the practice that you would like to start going to in the new years since your current neurologist is retiring in December.  Thanks for choosing [facility], we consider it a privelige to serve you.

## 2022-08-15 LAB — BMP8+EGFR
BUN/Creatinine Ratio: 19 (ref 9–23)
BUN: 15 mg/dL (ref 6–20)
CO2: 24 mmol/L (ref 20–29)
Calcium: 9.3 mg/dL (ref 8.7–10.2)
Chloride: 101 mmol/L (ref 96–106)
Creatinine, Ser: 0.81 mg/dL (ref 0.57–1.00)
Glucose: 97 mg/dL (ref 70–99)
Potassium: 3.7 mmol/L (ref 3.5–5.2)
Sodium: 140 mmol/L (ref 134–144)
eGFR: 96 mL/min/{1.73_m2} (ref >59)

## 2022-08-15 LAB — TSH: TSH: 2.24 u[IU]/mL (ref 0.450–4.500)

## 2022-08-17 ENCOUNTER — Encounter: Payer: Self-pay | Admitting: Family Medicine

## 2022-08-17 ENCOUNTER — Telehealth: Payer: Self-pay | Admitting: Family Medicine

## 2022-08-17 NOTE — Assessment & Plan Note (Signed)
Reports intermittent palpitations in past several months, had ablation in 2004. Upcoming spine surgery planned, refer cardiology for assessment and clearance

## 2022-08-17 NOTE — Assessment & Plan Note (Signed)
Needs to be managed by Neurology, gets botox every 2 months, pt to contact us re Provider she wishes to establish with

## 2022-08-17 NOTE — Assessment & Plan Note (Signed)
Controlled, no change in medication  

## 2022-08-17 NOTE — Assessment & Plan Note (Signed)
Improved, continue meds as before  Patient re-educated about  the importance of commitment to a  minimum of 150 minutes of exercise per week as able.  The importance of healthy food choices with portion control discussed, as well as eating regularly and within a 12 hour window most days. The need to choose "clean , green" food 50 to 75% of the time is discussed, as well as to make water the primary drink and set a goal of 64 ounces water daily.       08/14/2022    4:03 PM 06/26/2022    4:08 PM 06/23/2022    8:15 AM  Weight /BMI  Weight 202 lb 212 lb 0.6 oz 209 lb  Height '5\' 1"'$  (1.549 m) '5\' 1"'$  (1.549 m) '5\' 1"'$  (1.549 m)  BMI 38.17 kg/m2 40.06 kg/m2 39.49 kg/m2

## 2022-08-17 NOTE — Assessment & Plan Note (Signed)
Upcoming surgery planned, ill refer for cardiology clearance in light of report of recent intermittent palpitations and past h/o ablation

## 2022-08-17 NOTE — Telephone Encounter (Signed)
Pt called stating she was supposed to have had a medication sent in on Friday 10/13 & phar has not received. Can you please resend this medication?    phentermine (ADIPEX-P) 37.5 MG tablet   [facility]

## 2022-08-17 NOTE — Progress Notes (Signed)
CECELIA GRACIANO     MRN: [id number]      DOB: [birth year]   HPI Ms. Cwynar is here for follow up and re-evaluation of chronic medical conditions, medication management and review of any available recent lab and radiology data.  Preventive health is updated, specifically  Cancer screening and Immunization.   Questions or concerns regarding consultations or procedures which the PT has had in the interim are  addressed.Has back surgery planned an dis nervous but hopeful as her chronic pain I is debilitating and limits her level of function  The PT denies any adverse reactions to current medications since the last visit.  There are no new concerns.  There are no specific complaints   ROS Denies recent fever or chills. Denies sinus pressure, nasal congestion, ear pain or sore throat. Denies chest congestion, productive cough or wheezing. Denies chest pains,  and leg swelling, c/o intermittent palpitations in past several weeks Denies abdominal pain, nausea, vomiting,diarrhea or constipation.  Has upcoming back surgery planned as nd is nboth nervous and hopeful as situation currently is debilitating  Denies dysuria, frequency, hesitancy or incontinence. . Denies headaches, seizures, numbness, or tingling. Denies skin break down or rash. Denies adverse s/e of phentermine and has done very well with an avg of 5 pounds per month weight loss Needs new neurologist especially for headache management as has been getting botox every 2 months, also has ubrelvy, gabapentin and cymbalta prescribed by Neurology , she will research and get back to me re preferred Provider, wants someone close   PE  BP 114/69 (BP Location: Right Arm, Patient Position: Sitting)   Pulse 99   Ht '5\' 1"'$  (1.549 m)   Wt 202 lb (91.6 kg)   SpO2 98%   BMI 38.17 kg/m   Patient alert and oriented and in no cardiopulmonary distress.  HEENT: No facial asymmetry, EOMI,     Neck supple .  Chest: Clear to auscultation  bilaterally.  CVS: S1, S2 no murmurs, no S3.Regular rate.  ABD: Soft non tender.   Ext: No edema  MS: decreased  ROM spine,  adequate in shoulders,  and knees.  Skin: Intact, no ulcerations or rash noted.  Psych: Good eye contact, normal affect. Memory intact not anxious or depressed appearing.  CNS: CN 2-12 intact, power,  normal throughout.no focal deficits noted.   Assessment & Plan  Hypothyroidism Controlled, no change in medication   Morbid obesity ([place]) Improved, continue meds as before  Patient re-educated about  the importance of commitment to a  minimum of 150 minutes of exercise per week as able.  The importance of healthy food choices with portion control discussed, as well as eating regularly and within a 12 hour window most days. The need to choose "clean , green" food 50 to 75% of the time is discussed, as well as to make water the primary drink and set a goal of 64 ounces water daily.       08/14/2022    4:03 PM 06/26/2022    4:08 PM 06/23/2022    8:15 AM  Weight /BMI  Weight 202 lb 212 lb 0.6 oz 209 lb  Height '5\' 1"'$  (1.549 m) '5\' 1"'$  (1.549 m) '5\' 1"'$  (1.549 m)  BMI 38.17 kg/m2 40.06 kg/m2 39.49 kg/m2      Vitamin D deficiency Adequately corrected, continue current medication  Atrial flutter ([place]) Reports intermittent palpitations in past several months, had ablation in 2004. Upcoming spine surgery planned, refer cardiology for assessment  and clearance  Chronic headaches Needs to be managed by Neurology, gets botox every 2 months, pt to contact us re Provider she wishes to establish with  Lumbar back pain with radiculopathy affecting right lower extremity Upcoming surgery planned, ill refer for cardiology clearance in light of report of recent intermittent palpitations and past h/o ablation

## 2022-08-17 NOTE — Assessment & Plan Note (Signed)
Adequately corrected, continue current medication

## 2022-08-19 ENCOUNTER — Other Ambulatory Visit: Payer: Self-pay | Admitting: Neurological Surgery

## 2022-09-03 NOTE — Pre-Procedure Instructions (Signed)
Surgical Instructions    Your procedure is scheduled on Thursday, September 10, 2022 at 12:27 PM.  Report to [facility] Main Entrance "A" at 10:30 A.M., then check in with the Admitting office.  Call this number if you have problems the morning of surgery:  [phone]   If you have any questions prior to your surgery date call [phone]: Open Monday-Friday 8am-4pm  *If you experience any cold or flu symptoms such as cough, fever, chills, shortness of breath, etc. between now and your scheduled surgery, please notify us.*    Remember:  Do not eat after midnight the night before your surgery  You may drink clear liquids until 9:30 AM the morning of your surgery.   Clear liquids allowed are: Water, Non-Citrus Juices (without pulp), Carbonated Beverages, Clear Tea, Black Coffee Only (NO MILK, CREAM OR POWDERED CREAMER of any kind), and Gatorade.    Take these medicines the morning of surgery with A SIP OF WATER:  DULoxetine (CYMBALTA)  gabapentin (NEURONTIN)  levothyroxine (SYNTHROID)  metoprolol succinate (TOPROL-XL)  topiramate (TOPAMAX)  UBRELVY  albuterol (VENTOLIN HFA) - if needed  Please bring all inhalers with you the day of surgery.   Stop taking your phentermine (ADIPEX-P) 2 weeks prior surgery or as soon as possible. Your last dose should be on 08/26/22.  As of today, STOP taking any Aspirin (unless otherwise instructed by your surgeon) Aleve, Naproxen, Ibuprofen, Motrin, Advil, Goody's, BC's, all herbal medications, fish oil, and all vitamins.   HOW TO MANAGE YOUR DIABETES BEFORE AND AFTER SURGERY  Why is it important to control my blood sugar before and after surgery? Improving blood sugar levels before and after surgery helps healing and can limit problems. A way of improving blood sugar control is eating a healthy diet by:  Eating less sugar and carbohydrates  Increasing activity/exercise  Talking with your doctor about reaching your blood sugar goals High  blood sugars (greater than 180 mg/dL) can raise your risk of infections and slow your recovery, so you will need to focus on controlling your diabetes during the weeks before surgery. Make sure that the doctor who takes care of your diabetes knows about your planned surgery including the date and location.  How do I manage my blood sugar before surgery? Check your blood sugar at least 4 times a day, starting 2 days before surgery, to make sure that the level is not too high or low.  Check your blood sugar the morning of your surgery when you wake up and every 2 hours until you get to the Short Stay unit.  If your blood sugar is less than 70 mg/dL, you will need to treat for low blood sugar: Do not take insulin. Treat a low blood sugar (less than 70 mg/dL) with  cup of clear juice (cranberry or apple), 4 glucose tablets, OR glucose gel. Recheck blood sugar in 15 minutes after treatment (to make sure it is greater than 70 mg/dL). If your blood sugar is not greater than 70 mg/dL on recheck, call [phone] for further instructions. Report your blood sugar to the short stay nurse when you get to Short Stay.  If you are admitted to the hospital after surgery: Your blood sugar will be checked by the staff and you will probably be given insulin after surgery (instead of oral diabetes medicines) to make sure you have good blood sugar levels. The goal for blood sugar control after surgery is 80-180 mg/dL.  Do NOT Smoke (Tobacco/Vaping) for 24 hours prior to your procedure.  If you use a CPAP at night, you may bring your mask/headgear for your overnight stay.   Contacts, glasses, piercing's, hearing aid's, dentures or partials may not be worn into surgery, please bring cases for these belongings.    For patients admitted to the hospital, discharge time will be determined by your treatment team.   Patients discharged the day of surgery will not be allowed to drive home, and  someone needs to stay with them for 24 hours.  SURGICAL WAITING ROOM VISITATION Patients having surgery or a procedure may have two support people in the waiting area. Visitors may stay in the waiting area during the procedure and switch out with other visitors if needed. Children under the age of 39 must have an adult accompany them who is not the patient. If the patient needs to stay at the hospital during part of their recovery, the visitor guidelines for inpatient rooms apply.  Please refer to the [facility] website for the visitor guidelines for Inpatients (after your surgery is over and you are in a regular room).    Special instructions:   [facility]- Preparing For Surgery  Before surgery, you can play an important role. Because skin is not sterile, your skin needs to be as free of germs as possible. You can reduce the number of germs on your skin by washing with CHG (chlorahexidine gluconate) Soap before surgery.  CHG is an antiseptic cleaner which kills germs and bonds with the skin to continue killing germs even after washing.    Oral Hygiene is also important to reduce your risk of infection.  Remember - BRUSH YOUR TEETH THE MORNING OF SURGERY WITH YOUR REGULAR TOOTHPASTE  Please do not use if you have an allergy to CHG or antibacterial soaps. If your skin becomes reddened/irritated stop using the CHG.  Do not shave (including legs and underarms) for at least 48 hours prior to first CHG shower. It is OK to shave your face.  Please follow these instructions carefully.   Shower the NIGHT BEFORE SURGERY and the MORNING OF SURGERY  If you chose to wash your hair, wash your hair first as usual with your normal shampoo.  After you shampoo, rinse your hair and body thoroughly to remove the shampoo.  Use CHG Soap as you would any other liquid soap. You can apply CHG directly to the skin and wash gently with a scrungie or a clean washcloth.   Apply the CHG Soap to your body ONLY  FROM THE NECK DOWN.  Do not use on open wounds or open sores. Avoid contact with your eyes, ears, mouth and genitals (private parts). Wash Face and genitals (private parts)  with your normal soap.   Wash thoroughly, paying special attention to the area where your surgery will be performed.  Thoroughly rinse your body with warm water from the neck down.  DO NOT shower/wash with your normal soap after using and rinsing off the CHG Soap.  Pat yourself dry with a CLEAN TOWEL.  Wear CLEAN PAJAMAS to bed the night before surgery  Place CLEAN SHEETS on your bed the night before your surgery  DO NOT SLEEP WITH PETS.   Day of Surgery: Take a shower with CHG soap. Do not wear jewelry or makeup Do not wear lotions, powders, perfumes/colognes, or deodorant. Do not shave 48 hours prior to surgery. Do not bring valuables to the hospital.  [facility] is not  responsible for any belongings or valuables. Do not wear nail polish, gel polish, artificial nails, or any other type of covering on natural nails (fingers and toes) If you have artificial nails or gel coating that need to be removed by a nail salon, please have this removed prior to surgery. Artificial nails or gel coating may interfere with anesthesia's ability to adequately monitor your vital signs. Wear Clean/Comfortable clothing the morning of surgery Do not apply any deodorants/lotions.   Remember to brush your teeth WITH YOUR REGULAR TOOTHPASTE.   Please read over the following fact sheets that you were given.  If you received a COVID test during your pre-op visit  it is requested that you wear a mask when out in public, stay away from anyone that may not be feeling well and notify your surgeon if you develop symptoms. If you have been in contact with anyone that has tested positive in the last 10 days please notify you surgeon.

## 2022-09-04 ENCOUNTER — Encounter (HOSPITAL_COMMUNITY)
Admission: RE | Admit: 2022-09-04 | Discharge: 2022-09-04 | Disposition: A | Discharge status: Home / Self Care | Payer: BC Managed Care – PPO | Source: Ambulatory Visit | Attending: Neurological Surgery | Admitting: Neurological Surgery

## 2022-09-04 ENCOUNTER — Other Ambulatory Visit: Payer: Self-pay

## 2022-09-04 ENCOUNTER — Encounter (HOSPITAL_COMMUNITY): Payer: Self-pay

## 2022-09-04 VITALS — BP 141/88 | HR 92 | Temp 98.2°F | Resp 17 | Ht 61.0 in | Wt 207.6 lb

## 2022-09-04 DIAGNOSIS — Z01818 Encounter for other preprocedural examination: Secondary | ICD-10-CM

## 2022-09-04 HISTORY — DX: Cardiac arrhythmia, unspecified: I49.9

## 2022-09-04 HISTORY — DX: Headache, unspecified: R51.9

## 2022-09-04 HISTORY — DX: Hypothyroidism, unspecified: E03.9

## 2022-09-04 LAB — CBC
HCT: 41.3 % (ref 36.0–46.0)
Hemoglobin: 13.1 g/dL (ref 12.0–15.0)
MCH: 29.9 pg (ref 26.0–34.0)
MCHC: 31.7 g/dL (ref 30.0–36.0)
MCV: 94.3 fL (ref 80.0–100.0)
Platelets: 300 10*3/uL (ref 150–400)
RBC: 4.38 MIL/uL (ref 3.87–5.11)
RDW: 13.2 % (ref 11.5–15.5)
WBC: 9 10*3/uL (ref 4.0–10.5)
nRBC: 0 % (ref 0.0–0.2)

## 2022-09-04 LAB — HEMOGLOBIN A1C
Hgb A1c MFr Bld: 4.8 % (ref 4.8–5.6)
Mean Plasma Glucose: 91.06 mg/dL

## 2022-09-04 LAB — SURGICAL PCR SCREEN
MRSA, PCR: NEGATIVE
Staphylococcus aureus: POSITIVE — AB

## 2022-09-04 LAB — TYPE AND SCREEN
ABO/RH(D): O POS
Antibody Screen: NEGATIVE

## 2022-09-04 NOTE — Progress Notes (Signed)
PCP - [place] Levo. Simpson,MD Cardiologist - [facility] in [place] seen by Levell July NP in 2021  PPM/ICD - denies Device Orders -  Rep Notified -   Chest x-ray - 11/06/21 EKG - 09/04/22 Stress Test - none ECHO - 08/31/03 Cardiac Cath - none  Sleep Study - none CPAP -   Fasting Blood Sugar - na Checks Blood Sugar _____ times a day  Last dose of GLP1 agonist-  na GLP1 instructions:   Blood Thinner Instructions:na Aspirin Instructions:na  ERAS Protcol -clear liquids until 0930 PRE-SURGERY Ensure or G2- no  COVID TEST- na   Anesthesia review: yes-pt taking phentermine until 09/03/22.hx aflutter,afib ablation  Patient denies shortness of breath, fever, cough and chest pain at PAT appointment   All instructions explained to the patient, with a verbal understanding of the material. Patient agrees to go over the instructions while at home for a better understanding. Patient also instructed to wear a mask when out in public prior to surgery. The opportunity to ask questions was provided.

## 2022-09-08 ENCOUNTER — Telehealth: Payer: Self-pay | Admitting: *Deleted

## 2022-09-08 NOTE — Progress Notes (Unsigned)
Cardiology Office Note    Date:  09/09/2022   ID:  Alejandra, Barr Feb 06, 1986, MRN [id number]  PCP:  Alejandra Helper, MD  Cardiologist: Previously Dr. 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  She underwent RFA with successful management of atrial flutter by Dr. Lovena Le back in 2004. She reports there was a suspected genetic component as her mother is a longstanding patient of Dr. Caryl Barr with prior ablations and her grandmother had arrhythmias as ewll. Last monitor 2019 showed NSR to sinus tach, isolated PACs, 2.6% PVCs without sustained arrhythmias, average HR 99bpm. Remote echo age 14 showed normal EF, no significant valve disease. 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  She is seen for pre-op evaluation for laminectomy tomorrow. She has done very well over the years. She reports that she had some flaring up of palpitations around the time of her prior pregnancy at which time her metoprolol dose was increased to '50mg'$  daily, and that helped reduce her palpitations back to baseline. They now happen only once every Barr months and are very transient. She otherwise has not had any recent cardiac symptoms. She has 4 children ages 75 and under so she is a very busy mom and remains physically active despite her back pain. She is able to go up and down stairs without angina or dyspnea. No syncope.  Labwork independently reviewed: 09/2022 CBC wnl 08/2022 TSH wnl, K 3.7, Cr 0.81 03/2022 LDL 93 (PCP), trig 73, A1c 5.1  Cardiology Studies:   Studies reviewed are outlined and summarized above. Reports included below if pertinent.   Monitor 2019 Sinus rhythm and sinus tachycardia seen. Isolated  PACs and PVCs. No sustained arrhythmias.   Echo Age 14  2-D AND DOPPLER IMAGING:  1. The left ventricular is normal size with normal systolic and diastolic     function.  No wall motion abnormality is seen.  2. The right ventricle is normal size with normal systolic function, no wall     motion abnormalities are seen.  3. Both atria are of normal size.  There is no evidence of atrial septal     defect.  4. The aortic valve is trileaflet, tri-commissure with no evidence of     stenosis or regurgitation.  5. The mitral valve is morphologically unremarkable with no evidence of     stenosis or regurgitation.  6. The tricuspid valve is morphologically unremarkable with no stenosis or     regurgitation.  7. The pulmonic valve is morphologically unremarkable with trace     insufficiency.  No stenosis is seen.  8. The pericardial structures are normal.  9. The ascending aorta is normal.  10.      The inferior vena cava is normal.    Past Medical History:  Diagnosis Date   Asthma    Atrial flutter ([place])    Status post RFA by Dr. 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    Iron deficiency    Iron deficiency anemia 10/02/2010   Qualifier: Diagnosis of  By: Alejandra Cipro MD, Alejandra Barr     Low vitamin B12 level 07/10/2013   Overview:  Last Assessment & Plan:  Being treated through hematology   Newborn product of in vitro fertilization (IVF) pregnancy    Obesity    PONV (postoperative nausea and vomiting)     Past Surgical History:  Procedure Laterality Date   Bilateral eustachian tube placement on 4 different occasions     CARDIAC ELECTROPHYSIOLOGY [place]  2004   for aflutter/afib   CARPAL TUNNEL RELEASE Bilateral  05/2018   CESAREAN SECTION N/A 05/26/2017   Procedure: CESAREAN SECTION;  Surgeon: Alejandra Hedges, DO;  Location: [facility];  Service: Obstetrics;  Laterality: N/A;   CESAREAN SECTION N/A 03/01/2021   Procedure: Repeat CESAREAN SECTION;  Surgeon: Alejandra Few, MD;  Location: [facility] LD [facility]S;  Service: Obstetrics;  Laterality: N/A;  EDD: 03/07/21   CESAREAN SECTION MULTI-GESTATIONAL N/A 02/10/2019   Procedure: Repeat CESAREAN SECTION MULTI-GESTATIONAL;  Surgeon: Alejandra Few, MD;  Location: [facility];  Service: Obstetrics;  Laterality: N/A;  EDD: 03/15/19 Allergy: Penicillin, Morphine, Barr   CHOLECYSTECTOMY  2003   COLONOSCOPY N/A 11/29/2013   Procedure: COLONOSCOPY;  Surgeon: Rogene Houston, MD;  [facility];  Service: Endoscopy;  Laterality: N/A;  120-rescheduled to 300 Ann notified pt   GASTRIC BYPASS  2006   TONSILLECTOMY  1990    Current Medications: Current Meds  Medication Sig   acetaminophen (TYLENOL) 325 MG tablet Take 650 mg by mouth every 6 (six) hours as needed for mild pain or moderate pain.   albuterol (VENTOLIN HFA) 108 (90 Base) MCG/ACT inhaler Inhale 2 puffs into the lungs every 6 (six) hours as needed for wheezing or shortness of breath.   Botulinum Toxin Type A (BOTOX) 200 units SOLR 155 Units See admin instructions. INJECT 155 UNITS EVERY THREE MONTHS FOR CHRONIC MIGRAINES.   cyanocobalamin (,VITAMIN B-12,) 1000 MCG/ML injection Inject 1 mL (1,000 mcg total) into the muscle once a week.   DULoxetine (CYMBALTA) 30 MG capsule Take 30 mg by mouth 2 (two) times daily.   gabapentin (NEURONTIN) 300 MG capsule Take 300 mg by mouth 2 (two) times daily.   iron polysaccharides (FERREX 150) 150 MG capsule Take 1 capsule (150 mg total) by mouth daily.   Iron Sucrose (VENOFER IV) Inject into the vein. Every three months   levothyroxine (SYNTHROID) 75 MCG tablet Take 75 mcg by mouth daily before breakfast.   metoprolol succinate (TOPROL-XL) 25 MG 24 hr tablet TAKE  2 TABLETS DAILY (DOSE INCREASE)   NEEDLE, DISP, 25 G 25G X 1-1/2" MISC To be used with B 12 injections   OVER THE COUNTER MEDICATION BC powder   phentermine (ADIPEX-P) 37.5 MG tablet Take 1 tablet (37.5 mg total) by mouth daily before breakfast. *patient reports no longer taking as of October 2023  was only using prior to spine surgery to see if it helped the pain and it did not *   Prenatal Vit-Fe Fumarate-FA (PRENATAL PO) Take 1 tablet by mouth daily.   topiramate (TOPAMAX) 100 MG tablet Take 100 mg by mouth 2 (two) times daily.   UBRELVY 100 MG TABS Take by mouth.   Vitamin D, Ergocalciferol, (DRISDOL) 1.25 MG (50000 UNIT) CAPS capsule Take 1 capsule (50,000 Units total) by mouth once a week.    Allergies:   Ciprofloxacin, Morphine  and related, and Penicillins   Social History   Socioeconomic History   Marital status: Married    Spouse name: Not on file   Number of children: Not on file   Years of education: Not on file   Highest education level: Not on file  Occupational History   Occupation: Nurse     Employer: [employer]  Tobacco Use   Smoking status: Former    Packs/day: 0.50    Types: Cigarettes    Start date: 03/21/2013    Quit date: 06/02/2015    Years since quitting: 7.2   Smokeless tobacco: Never  Vaping Use   Vaping Use: Never used  Substance and Sexual Activity   Alcohol use: No    Alcohol/week: 0.0 standard drinks of alcohol    Comment: sober alcoholic 6 years   Drug use: No   Sexual activity: Not Currently    Birth control/protection: None    Comment: husband had vasectomy  Other Topics Concern   Not on file  Social History Narrative   Not on file   Social Determinants of Health   Financial Resource Strain: Low Risk  (02/11/2021)   Overall Financial Resource Strain (CARDIA)    Difficulty of Paying Living Expenses: Not hard at all  Food Insecurity: No Food Insecurity (02/11/2021)   Hunger Vital Sign    Worried About Running Out of Food in the Last Year: Never  true    [place] in the Last Year: Never true  Transportation Needs: No Transportation Needs (02/11/2021)   PRAPARE - Hydrologist (Medical): No    Lack of Transportation (Non-Medical): No  Physical Activity: Inactive (02/11/2021)   Exercise Vital Sign    Days of Exercise per Week: 0 days    Minutes of Exercise per Session: 0 min  Stress: No Stress Concern Present (02/11/2021)   [place]    Feeling of Stress : Not at all  Social Connections: Moderately Integrated (02/11/2021)   Social Connection and Isolation Panel [NHANES]    Frequency of Communication with Friends and Family: More than three times a week    Frequency of Social Gatherings with Friends and Family: More than three times a week    Attends Religious Services: 1 to 4 times per year    Active Member of [employer] or Organizations: No    Attends Music therapist: Never    Marital Status: Married     Family History:  The patient's family history includes Arrhythmia in her mother; Breast cancer (age of onset: 74) in her mother; Diabetes in her maternal grandmother; Fibromyalgia in her mother; Heart defect in her mother; Heart disease in her paternal grandfather and paternal grandmother; Hypertension in her maternal grandfather, maternal grandmother, paternal grandfather, and paternal grandmother; Hypothyroidism in her mother; Obesity in her mother.  ROS:   Please see the history of present illness. All other systems are reviewed and otherwise negative.    EKG(s)/Additional Labs   EKG:  EKG is ordered today, personally reviewed, demonstrating NSR 86bpm, no acute STT changes. QTC wnl  Recent Labs: 08/14/2022: BUN 15; Creatinine, Ser 0.81; Potassium 3.7; Sodium 140; TSH 2.240 09/04/2022: Hemoglobin 13.1; Platelets 300  Recent Lipid Panel    Component Value Date/Time   CHOL 160 03/10/2022 1148   TRIG 73 03/10/2022  1148   HDL 52 03/10/2022 1148   CHOLHDL 3.1 03/10/2022 1148   VLDL 15 03/10/2022 1148  [place] 93 03/10/2022 1148   LDLCALC 90 05/10/2018 0805    PHYSICAL EXAM:    VS:  BP 116/74   Pulse 86   Ht '5\' 1"'$  (1.549 m)   Wt 209 lb 3.2 oz (94.9 kg)   LMP 09/03/2022 (Exact Date)   SpO2 97%   BMI 39.53 kg/m   BMI: Body mass index is 39.53 kg/m.  GEN: Well nourished, well developed female in no acute distress HEENT: normocephalic, atraumatic Neck: no JVD, carotid bruits, or masses Cardiac: RRR; no murmurs, rubs, or gallops, no edema  Respiratory:  clear to auscultation bilaterally, normal work of breathing GI: soft, nontender, nondistended, + BS MS: no deformity or atrophy Skin: warm and dry, no rash Neuro:  Alert and Oriented x 3, Strength and sensation are intact, follows commands Psych: euthymic mood, full affect  Wt Readings from Last 3 Encounters:  09/09/22 209 lb 3.2 oz (94.9 kg)  09/04/22 207 lb 9.6 oz (94.2 kg)  08/14/22 202 lb (91.6 kg)     ASSESSMENT & PLAN:   1. Pre-op cardiovascular exam - RCRI 0.4% indicating low CV risk. She is not describing any unstable cardiac symptoms. Remote echo showed normal LV function. She is able to exceed over 4 METS in usual ADLs without angina or dyspnea. Therefore, based on ACC/AHA guidelines, AMZIE SILLAS would be at acceptable risk for the planned procedure without further cardiovascular testing. Will fax this note to requesting surgeon ASAP upon signing. She is not on any blood thinners from our standpoint but does report she stopped BC powders in her instructions from pre-admission testing.  2. Palpitations - rare, infrequent, responded well to metoprolol. If her burden increases we can revisit event monitoring but now they are only every Barr months. We discussed use of Kardia to help track rhythm for any future breakthrough. I think it would be helpful to have an updated echocardiogram but this does NOT need to be done urgently, can  schedule after her surgery.  3. Paroxysmal atrial flutter - largely quiescent. Continue metoprolol and plan above. We did discuss avoidance of phentermine going forward because of potential cardiac arrhythmia effects.  4. PACs/PVCs - continue metoprolol. Low burden on prior monitor. Follow symptoms as above.     Disposition: F/u in 1 year. Will need to establish with new [facility] physician upon transfer from Dr. Bronson Ing.   Medication Adjustments/Labs and Tests Ordered: Current medicines are reviewed at length with the patient today.  Concerns regarding medicines are outlined above. Medication changes, Labs and Tests ordered today are summarized above and listed in the Patient Instructions accessible in Encounters.   Signed, Charlie Pitter, PA-C  09/09/2022 3:00 PM    [place] Phone: [phone]; Fax: [phone]

## 2022-09-08 NOTE — Telephone Encounter (Signed)
   Pre-operative Risk Assessment    Patient Name: Alejandra Barr  DOB: June 08, 1986 MRN: [id number]     PT HAS APPT TOMORROW 09/09/22 AT 2:45 WITH DYNA DUNN, PAC.  Request for Surgical Clearance    Procedure:   T11 LAMINECTOMY FOR RESECTION OF TUMOR (URGENT)  Date of Surgery:  Clearance 09/10/22 (URGENT)                               Surgeon:  DR. Emelda Brothers  Surgeon's Group or Practice Name:  [facility] Phone number:  [phone] Fax number:  [phone]   Type of Clearance Requested:   - Medical ; NO MEDICATIONS LISTED TO BE HELD   Type of Anesthesia:  General    Additional requests/questions:    Jiles Prows   09/08/2022, 5:27 PM

## 2022-09-09 ENCOUNTER — Encounter: Payer: Self-pay | Admitting: Physician Assistant

## 2022-09-09 ENCOUNTER — Ambulatory Visit: Payer: BC Managed Care – PPO | Attending: Physician Assistant | Admitting: Physician Assistant

## 2022-09-09 VITALS — BP 116/74 | HR 86 | Ht 61.0 in | Wt 209.2 lb

## 2022-09-09 DIAGNOSIS — I4892 Unspecified atrial flutter: Secondary | ICD-10-CM

## 2022-09-09 DIAGNOSIS — I493 Ventricular premature depolarization: Secondary | ICD-10-CM | POA: Diagnosis not present

## 2022-09-09 DIAGNOSIS — Z01810 Encounter for preprocedural cardiovascular examination: Secondary | ICD-10-CM | POA: Diagnosis not present

## 2022-09-09 DIAGNOSIS — R002 Palpitations: Secondary | ICD-10-CM | POA: Diagnosis not present

## 2022-09-09 DIAGNOSIS — I491 Atrial premature depolarization: Secondary | ICD-10-CM | POA: Diagnosis not present

## 2022-09-09 NOTE — Telephone Encounter (Signed)
Faxed clearance note:

## 2022-09-09 NOTE — Patient Instructions (Addendum)
Medication Instructions:   Your physician recommends that you continue on your current medications as directed. Please refer to the Current Medication list given to you today.   *If you need a refill on your cardiac medications before your next appointment, please call your pharmacy*   Lab Work: [place]   If you have labs (blood work) drawn today and your tests are completely normal, you will receive your results only by: [place] (if you have MyChart) OR A paper copy in the mail If you have any lab test that is abnormal or we need to change your treatment, we will call you to review the results.   Testing/Procedures: Your physician has requested that you have an echocardiogram. Echocardiography is a painless test that uses sound waves to create images of your heart. It provides your doctor with information about the size and shape of your heart and how well your heart's chambers and valves are working. This procedure takes approximately one hour. There are no restrictions for this procedure. Please do NOT wear cologne, perfume, aftershave, or lotions (deodorant is allowed). Please arrive 15 minutes prior to your appointment time.   Follow-Up: At [facility], you and your health needs are our priority.  As part of our continuing mission to provide you with exceptional heart care, we have created designated Provider Care Teams.  These Care Teams include your primary Cardiologist (physician) and Advanced Practice Providers (APPs -  Physician Assistants and Nurse Practitioners) who all work together to provide you with the care you need, when you need it.  We recommend signing up for the patient portal called "MyChart".  Sign up information is provided on this After Visit Summary.  MyChart is used to connect with patients for Virtual Visits (Telemedicine).  Patients are able to view lab/test results, encounter notes, upcoming appointments, etc.  Non-urgent messages  can be sent to your provider as well.   To learn more about what you can do with MyChart, go to [email or web address].    Your next appointment:   1 year(s)  The format for your next appointment:   In Person  Provider:  ANY AVAILABLE  TO ESTABLISH WITH  A  NEW PATIENT  IN [place] ( OLD [facility] PATIENT)   Other Instructions   Important Information About Sugar

## 2022-09-10 ENCOUNTER — Inpatient Hospital Stay (HOSPITAL_COMMUNITY): Payer: BC Managed Care – PPO

## 2022-09-10 ENCOUNTER — Inpatient Hospital Stay (HOSPITAL_COMMUNITY): Payer: BC Managed Care – PPO | Admitting: Anesthesiology

## 2022-09-10 ENCOUNTER — Inpatient Hospital Stay (HOSPITAL_COMMUNITY): Payer: BC Managed Care – PPO | Admitting: Physician Assistant

## 2022-09-10 ENCOUNTER — Encounter (HOSPITAL_COMMUNITY)
Admission: RE | Disposition: A | Discharge status: Home / Self Care | Payer: Self-pay | Source: Home / Self Care | Attending: Neurological Surgery

## 2022-09-10 ENCOUNTER — Other Ambulatory Visit: Payer: Self-pay

## 2022-09-10 ENCOUNTER — Encounter (HOSPITAL_COMMUNITY): Payer: Self-pay | Admitting: Neurological Surgery

## 2022-09-10 ENCOUNTER — Inpatient Hospital Stay (HOSPITAL_COMMUNITY)
Admission: RE | Admit: 2022-09-10 | Discharge: 2022-09-11 | DRG: 030 | Disposition: A | Discharge status: Home / Self Care | Payer: BC Managed Care – PPO | Attending: Neurological Surgery | Admitting: Neurological Surgery

## 2022-09-10 DIAGNOSIS — Z79890 Hormone replacement therapy (postmenopausal): Secondary | ICD-10-CM | POA: Diagnosis not present

## 2022-09-10 DIAGNOSIS — F419 Anxiety disorder, unspecified: Secondary | ICD-10-CM | POA: Diagnosis present

## 2022-09-10 DIAGNOSIS — M5442 Lumbago with sciatica, left side: Secondary | ICD-10-CM | POA: Diagnosis not present

## 2022-09-10 DIAGNOSIS — Z87891 Personal history of nicotine dependence: Secondary | ICD-10-CM | POA: Diagnosis not present

## 2022-09-10 DIAGNOSIS — M40204 Unspecified kyphosis, thoracic region: Secondary | ICD-10-CM | POA: Diagnosis not present

## 2022-09-10 DIAGNOSIS — Z01818 Encounter for other preprocedural examination: Secondary | ICD-10-CM

## 2022-09-10 DIAGNOSIS — G8918 Other acute postprocedural pain: Secondary | ICD-10-CM | POA: Diagnosis not present

## 2022-09-10 DIAGNOSIS — D334 Benign neoplasm of spinal cord: Secondary | ICD-10-CM | POA: Diagnosis not present

## 2022-09-10 DIAGNOSIS — Z880 Allergy status to penicillin: Secondary | ICD-10-CM | POA: Diagnosis not present

## 2022-09-10 DIAGNOSIS — Z881 Allergy status to other antibiotic agents status: Secondary | ICD-10-CM | POA: Diagnosis not present

## 2022-09-10 DIAGNOSIS — J45909 Unspecified asthma, uncomplicated: Secondary | ICD-10-CM | POA: Diagnosis not present

## 2022-09-10 DIAGNOSIS — D492 Neoplasm of unspecified behavior of bone, soft tissue, and skin: Principal | ICD-10-CM | POA: Diagnosis present

## 2022-09-10 DIAGNOSIS — D321 Benign neoplasm of spinal meninges: Secondary | ICD-10-CM | POA: Diagnosis not present

## 2022-09-10 DIAGNOSIS — Z9884 Bariatric surgery status: Secondary | ICD-10-CM | POA: Diagnosis not present

## 2022-09-10 DIAGNOSIS — F32A Depression, unspecified: Secondary | ICD-10-CM | POA: Diagnosis present

## 2022-09-10 DIAGNOSIS — Z9049 Acquired absence of other specified parts of digestive tract: Secondary | ICD-10-CM

## 2022-09-10 DIAGNOSIS — Z79899 Other long term (current) drug therapy: Secondary | ICD-10-CM | POA: Diagnosis not present

## 2022-09-10 DIAGNOSIS — Z885 Allergy status to narcotic agent status: Secondary | ICD-10-CM

## 2022-09-10 DIAGNOSIS — D329 Benign neoplasm of meninges, unspecified: Secondary | ICD-10-CM | POA: Diagnosis not present

## 2022-09-10 DIAGNOSIS — M5441 Lumbago with sciatica, right side: Secondary | ICD-10-CM | POA: Diagnosis not present

## 2022-09-10 DIAGNOSIS — M4804 Spinal stenosis, thoracic region: Secondary | ICD-10-CM | POA: Diagnosis not present

## 2022-09-10 DIAGNOSIS — E669 Obesity, unspecified: Secondary | ICD-10-CM | POA: Diagnosis present

## 2022-09-10 DIAGNOSIS — D497 Neoplasm of unspecified behavior of endocrine glands and other parts of nervous system: Secondary | ICD-10-CM | POA: Diagnosis not present

## 2022-09-10 DIAGNOSIS — G8929 Other chronic pain: Secondary | ICD-10-CM | POA: Diagnosis present

## 2022-09-10 DIAGNOSIS — E039 Hypothyroidism, unspecified: Secondary | ICD-10-CM | POA: Diagnosis not present

## 2022-09-10 DIAGNOSIS — Z6839 Body mass index (BMI) 39.0-39.9, adult: Secondary | ICD-10-CM

## 2022-09-10 DIAGNOSIS — Z981 Arthrodesis status: Secondary | ICD-10-CM | POA: Diagnosis not present

## 2022-09-10 DIAGNOSIS — I4891 Unspecified atrial fibrillation: Secondary | ICD-10-CM | POA: Diagnosis not present

## 2022-09-10 DIAGNOSIS — M5124 Other intervertebral disc displacement, thoracic region: Secondary | ICD-10-CM | POA: Diagnosis not present

## 2022-09-10 HISTORY — PX: LAMINECTOMY: SHX219

## 2022-09-10 LAB — POCT PREGNANCY, URINE: Preg Test, Ur: NEGATIVE

## 2022-09-10 SURGERY — THORACIC LAMINECTOMY FOR TUMOR
Anesthesia: General

## 2022-09-10 MED ORDER — ACETAMINOPHEN 325 MG PO TABS
650.0000 mg | ORAL_TABLET | ORAL | Status: DC | PRN
  Administered 2022-09-11: 650 mg via ORAL
  Filled 2022-09-10: qty 2

## 2022-09-10 MED ORDER — GABAPENTIN 300 MG PO CAPS
300.0000 mg | ORAL_CAPSULE | Freq: Two times a day (BID) | ORAL | Status: DC
  Administered 2022-09-10 – 2022-09-11 (×2): 300 mg via ORAL
  Filled 2022-09-10 (×2): qty 1

## 2022-09-10 MED ORDER — FENTANYL CITRATE (PF) 100 MCG/2ML IJ SOLN
25.0000 ug | INTRAMUSCULAR | Status: DC | PRN
  Administered 2022-09-10 (×3): 50 ug via INTRAVENOUS

## 2022-09-10 MED ORDER — PROPOFOL 1000 MG/100ML IV EMUL
INTRAVENOUS | Status: AC
  Filled 2022-09-10: qty 100

## 2022-09-10 MED ORDER — CHLORHEXIDINE GLUCONATE 0.12 % MT SOLN
15.0000 mL | Freq: Once | OROMUCOSAL | Status: AC
  Administered 2022-09-10: 15 mL via OROMUCOSAL
  Filled 2022-09-10: qty 15

## 2022-09-10 MED ORDER — ACETAMINOPHEN 650 MG RE SUPP: 650.0000 mg | RECTAL | Status: DC | PRN

## 2022-09-10 MED ORDER — FENTANYL CITRATE (PF) 100 MCG/2ML IJ SOLN
INTRAMUSCULAR | Status: AC
  Filled 2022-09-10: qty 2

## 2022-09-10 MED ORDER — CHLORHEXIDINE GLUCONATE CLOTH 2 % EX PADS: 6.0000 | MEDICATED_PAD | Freq: Once | CUTANEOUS | Status: DC

## 2022-09-10 MED ORDER — VANCOMYCIN HCL IN DEXTROSE 1-5 GM/200ML-% IV SOLN
1000.0000 mg | Freq: Once | INTRAVENOUS | Status: AC
  Administered 2022-09-10: 1000 mg via INTRAVENOUS
  Filled 2022-09-10: qty 200

## 2022-09-10 MED ORDER — PHENYLEPHRINE 80 MCG/ML (10ML) SYRINGE FOR IV PUSH (FOR BLOOD PRESSURE SUPPORT)
PREFILLED_SYRINGE | INTRAVENOUS | Status: DC | PRN
  Administered 2022-09-10 (×2): 160 ug via INTRAVENOUS

## 2022-09-10 MED ORDER — ALBUTEROL SULFATE (2.5 MG/3ML) 0.083% IN NEBU: 2.5000 mg | INHALATION_SOLUTION | Freq: Four times a day (QID) | RESPIRATORY_TRACT | Status: DC | PRN

## 2022-09-10 MED ORDER — LIDOCAINE 2% (20 MG/ML) 5 ML SYRINGE
INTRAMUSCULAR | Status: DC | PRN
  Administered 2022-09-10: 100 mg via INTRAVENOUS

## 2022-09-10 MED ORDER — SODIUM CHLORIDE 0.9 % IV SOLN: 250.0000 mL | INTRAVENOUS | Status: DC

## 2022-09-10 MED ORDER — ONDANSETRON HCL 4 MG/2ML IJ SOLN
INTRAMUSCULAR | Status: DC | PRN
  Administered 2022-09-10: 4 mg via INTRAVENOUS

## 2022-09-10 MED ORDER — ASPIRIN-ACETAMINOPHEN-CAFFEINE 250-250-65 MG PO TABS: 1.0000 | ORAL_TABLET | Freq: Three times a day (TID) | ORAL | Status: DC | PRN

## 2022-09-10 MED ORDER — ONDANSETRON HCL 4 MG/2ML IJ SOLN
INTRAMUSCULAR | Status: AC
  Filled 2022-09-10: qty 2

## 2022-09-10 MED ORDER — MIDAZOLAM HCL 2 MG/2ML IJ SOLN
INTRAMUSCULAR | Status: DC | PRN
  Administered 2022-09-10: 2 mg via INTRAVENOUS

## 2022-09-10 MED ORDER — 0.9 % SODIUM CHLORIDE (POUR BTL) OPTIME
TOPICAL | Status: DC | PRN
  Administered 2022-09-10: 1000 mL

## 2022-09-10 MED ORDER — MENTHOL 3 MG MT LOZG: 1.0000 | LOZENGE | OROMUCOSAL | Status: DC | PRN

## 2022-09-10 MED ORDER — DEXAMETHASONE SODIUM PHOSPHATE 10 MG/ML IJ SOLN
INTRAMUSCULAR | Status: AC
  Filled 2022-09-10: qty 2

## 2022-09-10 MED ORDER — PROPOFOL 10 MG/ML IV BOLUS
INTRAVENOUS | Status: AC
  Filled 2022-09-10: qty 20

## 2022-09-10 MED ORDER — CYCLOBENZAPRINE HCL 10 MG PO TABS
10.0000 mg | ORAL_TABLET | Freq: Three times a day (TID) | ORAL | Status: DC | PRN
  Administered 2022-09-11: 10 mg via ORAL
  Filled 2022-09-10: qty 1

## 2022-09-10 MED ORDER — LACTATED RINGERS IV SOLN: INTRAVENOUS | Status: DC | PRN

## 2022-09-10 MED ORDER — ONDANSETRON HCL 4 MG PO TABS: 4.0000 mg | ORAL_TABLET | Freq: Four times a day (QID) | ORAL | Status: DC | PRN

## 2022-09-10 MED ORDER — LIDOCAINE-EPINEPHRINE 1 %-1:100000 IJ SOLN
INTRAMUSCULAR | Status: DC | PRN
  Administered 2022-09-10: 10 mL

## 2022-09-10 MED ORDER — HYDROMORPHONE HCL 1 MG/ML IJ SOLN
INTRAMUSCULAR | Status: AC
  Filled 2022-09-10: qty 1

## 2022-09-10 MED ORDER — HYDROMORPHONE HCL 1 MG/ML IJ SOLN
1.0000 mg | INTRAMUSCULAR | Status: DC | PRN
  Administered 2022-09-10 – 2022-09-11 (×2): 1 mg via INTRAVENOUS
  Filled 2022-09-10 (×2): qty 1

## 2022-09-10 MED ORDER — FENTANYL CITRATE (PF) 250 MCG/5ML IJ SOLN
INTRAMUSCULAR | Status: DC | PRN
  Administered 2022-09-10 (×2): 50 ug via INTRAVENOUS
  Administered 2022-09-10: 100 ug via INTRAVENOUS
  Administered 2022-09-10 (×2): 50 ug via INTRAVENOUS

## 2022-09-10 MED ORDER — OXYCODONE HCL 5 MG PO TABS: 5.0000 mg | ORAL_TABLET | ORAL | Status: DC | PRN

## 2022-09-10 MED ORDER — DEXAMETHASONE SODIUM PHOSPHATE 10 MG/ML IJ SOLN
INTRAMUSCULAR | Status: DC | PRN
  Administered 2022-09-10: 10 mg via INTRAVENOUS

## 2022-09-10 MED ORDER — TOPIRAMATE 100 MG PO TABS
100.0000 mg | ORAL_TABLET | Freq: Two times a day (BID) | ORAL | Status: DC
  Administered 2022-09-10 – 2022-09-11 (×2): 100 mg via ORAL
  Filled 2022-09-10 (×2): qty 1

## 2022-09-10 MED ORDER — HYDROMORPHONE HCL 1 MG/ML IJ SOLN
0.2500 mg | INTRAMUSCULAR | Status: DC | PRN
  Administered 2022-09-10: 0.5 mg via INTRAVENOUS

## 2022-09-10 MED ORDER — FENTANYL CITRATE (PF) 250 MCG/5ML IJ SOLN
INTRAMUSCULAR | Status: AC
  Filled 2022-09-10: qty 5

## 2022-09-10 MED ORDER — METOPROLOL SUCCINATE ER 25 MG PO TB24
25.0000 mg | ORAL_TABLET | Freq: Every day | ORAL | Status: DC
  Administered 2022-09-11: 25 mg via ORAL
  Filled 2022-09-10 (×2): qty 1

## 2022-09-10 MED ORDER — SCOPOLAMINE 1 MG/3DAYS TD PT72
1.0000 | MEDICATED_PATCH | Freq: Once | TRANSDERMAL | Status: DC
  Administered 2022-09-10: 1.5 mg via TRANSDERMAL
  Filled 2022-09-10: qty 1

## 2022-09-10 MED ORDER — LIDOCAINE 2% (20 MG/ML) 5 ML SYRINGE
INTRAMUSCULAR | Status: AC
  Filled 2022-09-10: qty 5

## 2022-09-10 MED ORDER — THROMBIN 5000 UNITS EX SOLR
OROMUCOSAL | Status: DC | PRN
  Administered 2022-09-10: 5 mL via TOPICAL

## 2022-09-10 MED ORDER — SODIUM CHLORIDE 0.9% FLUSH: 3.0000 mL | INTRAVENOUS | Status: DC | PRN

## 2022-09-10 MED ORDER — ONDANSETRON HCL 4 MG/2ML IJ SOLN: 4.0000 mg | Freq: Four times a day (QID) | INTRAMUSCULAR | Status: DC | PRN

## 2022-09-10 MED ORDER — LACTATED RINGERS IV SOLN: INTRAVENOUS | Status: DC

## 2022-09-10 MED ORDER — HEMOSTATIC AGENTS (NO CHARGE) OPTIME
TOPICAL | Status: DC | PRN
  Administered 2022-09-10: 1 via TOPICAL

## 2022-09-10 MED ORDER — EPHEDRINE 5 MG/ML INJ
INTRAVENOUS | Status: AC
  Filled 2022-09-10: qty 5

## 2022-09-10 MED ORDER — ACETAMINOPHEN 500 MG PO TABS
1000.0000 mg | ORAL_TABLET | Freq: Once | ORAL | Status: AC
  Administered 2022-09-10: 1000 mg via ORAL
  Filled 2022-09-10: qty 2

## 2022-09-10 MED ORDER — PROPOFOL 10 MG/ML IV BOLUS
INTRAVENOUS | Status: DC | PRN
  Administered 2022-09-10: 30 mg via INTRAVENOUS
  Administered 2022-09-10: 20 mg via INTRAVENOUS
  Administered 2022-09-10: 170 mg via INTRAVENOUS
  Administered 2022-09-10 (×2): 30 mg via INTRAVENOUS
  Administered 2022-09-10: 200 ug/kg/min via INTRAVENOUS
  Administered 2022-09-10: 30 mg via INTRAVENOUS
  Administered 2022-09-10: 50 mg via INTRAVENOUS
  Administered 2022-09-10: 20 mg via INTRAVENOUS

## 2022-09-10 MED ORDER — DULOXETINE HCL 30 MG PO CPEP
30.0000 mg | ORAL_CAPSULE | Freq: Two times a day (BID) | ORAL | Status: DC
  Administered 2022-09-10 – 2022-09-11 (×2): 30 mg via ORAL
  Filled 2022-09-10 (×2): qty 1

## 2022-09-10 MED ORDER — THROMBIN 5000 UNITS EX SOLR
CUTANEOUS | Status: AC
  Filled 2022-09-10: qty 5000

## 2022-09-10 MED ORDER — BUPIVACAINE HCL (PF) 0.5 % IJ SOLN
INTRAMUSCULAR | Status: AC
  Filled 2022-09-10: qty 30

## 2022-09-10 MED ORDER — EPHEDRINE SULFATE-NACL 50-0.9 MG/10ML-% IV SOSY
PREFILLED_SYRINGE | INTRAVENOUS | Status: DC | PRN
  Administered 2022-09-10: 10 mg via INTRAVENOUS
  Administered 2022-09-10 (×2): 5 mg via INTRAVENOUS

## 2022-09-10 MED ORDER — POLYETHYLENE GLYCOL 3350 17 G PO PACK: 17.0000 g | PACK | Freq: Every day | ORAL | Status: DC | PRN

## 2022-09-10 MED ORDER — PHENYLEPHRINE 80 MCG/ML (10ML) SYRINGE FOR IV PUSH (FOR BLOOD PRESSURE SUPPORT)
PREFILLED_SYRINGE | INTRAVENOUS | Status: AC
  Filled 2022-09-10: qty 10

## 2022-09-10 MED ORDER — PROMETHAZINE HCL 25 MG/ML IJ SOLN: 6.2500 mg | INTRAMUSCULAR | Status: DC | PRN

## 2022-09-10 MED ORDER — MIDAZOLAM HCL 2 MG/2ML IJ SOLN
INTRAMUSCULAR | Status: AC
  Filled 2022-09-10: qty 2

## 2022-09-10 MED ORDER — PHENYLEPHRINE HCL-NACL 20-0.9 MG/250ML-% IV SOLN
INTRAVENOUS | Status: DC | PRN
  Administered 2022-09-10: 30 ug/min via INTRAVENOUS

## 2022-09-10 MED ORDER — OXYCODONE HCL 5 MG PO TABS
10.0000 mg | ORAL_TABLET | ORAL | Status: DC | PRN
  Administered 2022-09-10 – 2022-09-11 (×3): 10 mg via ORAL
  Filled 2022-09-10 (×4): qty 2

## 2022-09-10 MED ORDER — DOCUSATE SODIUM 100 MG PO CAPS
100.0000 mg | ORAL_CAPSULE | Freq: Two times a day (BID) | ORAL | Status: DC
  Administered 2022-09-10 – 2022-09-11 (×2): 100 mg via ORAL
  Filled 2022-09-10 (×2): qty 1

## 2022-09-10 MED ORDER — VANCOMYCIN HCL IN DEXTROSE 1-5 GM/200ML-% IV SOLN
1000.0000 mg | INTRAVENOUS | Status: AC
  Administered 2022-09-10: 1000 mg via INTRAVENOUS
  Filled 2022-09-10: qty 200

## 2022-09-10 MED ORDER — PHENOL 1.4 % MT LIQD: 1.0000 | OROMUCOSAL | Status: DC | PRN

## 2022-09-10 MED ORDER — LEVOTHYROXINE SODIUM 75 MCG PO TABS
75.0000 ug | ORAL_TABLET | Freq: Every day | ORAL | Status: DC
  Administered 2022-09-11: 75 ug via ORAL
  Filled 2022-09-10: qty 1

## 2022-09-10 MED ORDER — SUCCINYLCHOLINE CHLORIDE 200 MG/10ML IV SOSY
PREFILLED_SYRINGE | INTRAVENOUS | Status: DC | PRN
  Administered 2022-09-10: 100 mg via INTRAVENOUS

## 2022-09-10 MED ORDER — ORAL CARE MOUTH RINSE: 15.0000 mL | Freq: Once | OROMUCOSAL | Status: AC

## 2022-09-10 MED ORDER — PHENTERMINE HCL 37.5 MG PO TABS: 37.5000 mg | ORAL_TABLET | Freq: Every day | ORAL | Status: DC

## 2022-09-10 MED ORDER — UBROGEPANT 100 MG PO TABS: 100.0000 mg | ORAL_TABLET | Freq: Every day | ORAL | Status: DC | PRN

## 2022-09-10 MED ORDER — LIDOCAINE-EPINEPHRINE 1 %-1:100000 IJ SOLN
INTRAMUSCULAR | Status: AC
  Filled 2022-09-10: qty 1

## 2022-09-10 SURGICAL SUPPLY — 68 items
ADH SKN CLS APL DERMABOND .7 (GAUZE/BANDAGES/DRESSINGS) ×1
APL SKNCLS STERI-STRIP NONHPOA (GAUZE/BANDAGES/DRESSINGS)
BAG COUNTER SPONGE SURGICOUNT (BAG) ×1 IMPLANT
BAG SPNG CNTER NS LX DISP (BAG) ×2
BAND INSRT 18 STRL LF DISP RB (MISCELLANEOUS) ×2
BAND RUBBER #18 3X1/16 STRL (MISCELLANEOUS) ×2 IMPLANT
BENZOIN TINCTURE PRP APPL 2/3 (GAUZE/BANDAGES/DRESSINGS) IMPLANT
BLADE CLIPPER SURG (BLADE) IMPLANT
BLADE SURG 11 STRL SS (BLADE) ×1 IMPLANT
BUR MATCHSTICK NEURO 3.0 LAGG (BURR) ×1 IMPLANT
BUR PRECISION FLUTE 5.0 (BURR) ×1 IMPLANT
CANISTER SUCT 3000ML PPV (MISCELLANEOUS) ×1 IMPLANT
CNTNR URN SCR LID CUP LEK RST (MISCELLANEOUS) ×1 IMPLANT
CONT SPEC 4OZ STRL OR WHT (MISCELLANEOUS) ×1
DERMABOND ADVANCED .7 DNX12 (GAUZE/BANDAGES/DRESSINGS) ×1 IMPLANT
DRAPE C-ARM 42X72 X-RAY (DRAPES) ×2 IMPLANT
DRAPE LAPAROTOMY 100X72X124 (DRAPES) ×1 IMPLANT
DRAPE MICROSCOPE SLANT 54X150 (MISCELLANEOUS) IMPLANT
DRAPE SURG 17X23 STRL (DRAPES) ×1 IMPLANT
DURAPREP 26ML APPLICATOR (WOUND CARE) ×1 IMPLANT
ELECT REM PT RETURN 9FT ADLT (ELECTROSURGICAL) ×1
ELECTRODE REM PT RTRN 9FT ADLT (ELECTROSURGICAL) ×1 IMPLANT
FEE INTRAOP CADWELL SUPPLY NCS (MISCELLANEOUS) IMPLANT
FEE INTRAOP MONITOR IMPULS NCS (MISCELLANEOUS) IMPLANT
GAUZE 4X4 16PLY ~~LOC~~+RFID DBL (SPONGE) IMPLANT
GAUZE SPONGE 4X4 12PLY STRL (GAUZE/BANDAGES/DRESSINGS) IMPLANT
GLOVE BIOGEL PI IND STRL 7.5 (GLOVE) ×2 IMPLANT
GLOVE ECLIPSE 7.5 STRL STRAW (GLOVE) ×2 IMPLANT
GLOVE EXAM NITRILE LRG STRL (GLOVE) IMPLANT
GLOVE EXAM NITRILE XL STR (GLOVE) IMPLANT
GLOVE EXAM NITRILE XS STR PU (GLOVE) IMPLANT
GOWN STRL REUS W/ TWL LRG LVL3 (GOWN DISPOSABLE) ×4 IMPLANT
GOWN STRL REUS W/ TWL XL LVL3 (GOWN DISPOSABLE) IMPLANT
GOWN STRL REUS W/TWL 2XL LVL3 (GOWN DISPOSABLE) IMPLANT
GOWN STRL REUS W/TWL LRG LVL3 (GOWN DISPOSABLE) ×4
GOWN STRL REUS W/TWL XL LVL3 (GOWN DISPOSABLE)
HEMOSTAT POWDER KIT SURGIFOAM (HEMOSTASIS) ×1 IMPLANT
INTRAOP CADWELL SUPPLY FEE NCS (MISCELLANEOUS) ×1
INTRAOP DISP SUPPLY FEE NCS (MISCELLANEOUS) ×1
INTRAOP MONITOR FEE IMPULS NCS (MISCELLANEOUS) ×1
INTRAOP MONITOR FEE IMPULSE (MISCELLANEOUS) ×1
KIT BASIN OR (CUSTOM PROCEDURE TRAY) ×1 IMPLANT
KIT POSITION SURG JACKSON T1 (MISCELLANEOUS) ×1 IMPLANT
KIT TURNOVER KIT B (KITS) ×1 IMPLANT
NDL HYPO 18GX1.5 BLUNT FILL (NEEDLE) IMPLANT
NDL SPNL 18GX3.5 QUINCKE PK (NEEDLE) IMPLANT
NEEDLE HYPO 18GX1.5 BLUNT FILL (NEEDLE) IMPLANT
NEEDLE HYPO 22GX1.5 SAFETY (NEEDLE) ×1 IMPLANT
NEEDLE SPNL 18GX3.5 QUINCKE PK (NEEDLE) IMPLANT
NS IRRIG 1000ML POUR BTL (IV SOLUTION) ×1 IMPLANT
PACK LAMINECTOMY NEURO (CUSTOM PROCEDURE TRAY) ×1 IMPLANT
PAD ARMBOARD 7.5X6 YLW CONV (MISCELLANEOUS) ×3 IMPLANT
SEALANT ADHERUS EXTEND TIP (MISCELLANEOUS) IMPLANT
SPIKE FLUID TRANSFER (MISCELLANEOUS) ×1 IMPLANT
SPONGE SURGIFOAM ABS GEL 100 (HEMOSTASIS) IMPLANT
SPONGE T-LAP 4X18 ~~LOC~~+RFID (SPONGE) IMPLANT
STRIP CLOSURE SKIN 1/2X4 (GAUZE/BANDAGES/DRESSINGS) IMPLANT
SUT MNCRL AB 3-0 PS2 18 (SUTURE) ×1 IMPLANT
SUT NURALON 4 0 TR CR/8 (SUTURE) IMPLANT
SUT PROLENE 6 0 BV (SUTURE) IMPLANT
SUT VIC AB 0 CT1 18XCR BRD8 (SUTURE) ×1 IMPLANT
SUT VIC AB 0 CT1 8-18 (SUTURE) ×1
SUT VIC AB 2-0 CP2 18 (SUTURE) IMPLANT
SYR 3ML LL SCALE MARK (SYRINGE) IMPLANT
TOWEL GREEN STERILE (TOWEL DISPOSABLE) ×1 IMPLANT
TOWEL GREEN STERILE FF (TOWEL DISPOSABLE) ×1 IMPLANT
TRAY FOLEY MTR SLVR 16FR STAT (SET/KITS/TRAYS/PACK) ×1 IMPLANT
WATER STERILE IRR 1000ML POUR (IV SOLUTION) ×1 IMPLANT

## 2022-09-10 NOTE — H&P (Signed)
Surgical H&P Update  HPI: 36 y.o. 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 No changes in health since they were last seen. Still having the above and wishes to proceed with surgery.  PMHx:  Past Medical History:  Diagnosis Date   Asthma    Atrial flutter ([place])    Status post RFA by Dr. 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    Iron deficiency    Iron deficiency anemia 10/02/2010   Qualifier: Diagnosis of  By: Moshe Cipro MD, Margaret     Low vitamin B12 level 07/10/2013   Overview:  Last Assessment & Plan:  Being treated through hematology   Newborn product of in vitro fertilization (IVF) pregnancy    Obesity    PONV (postoperative nausea and vomiting)    FamHx:  Family History  Problem Relation Age of Onset   Fibromyalgia Mother        Chronic pelvic pain    Hypothyroidism Mother    Obesity Mother    Heart defect Mother        Supraventricular, tachycardia /tachypalpation    Arrhythmia Mother    Breast cancer Mother 45   Hypertension Maternal Grandmother    Diabetes Maternal Grandmother    Hypertension Maternal Grandfather    Heart disease Paternal Grandmother    Hypertension Paternal Grandmother    Heart disease Paternal Grandfather    Hypertension Paternal Grandfather    SocHx:  reports that she quit smoking about 7 years ago. Her smoking use included cigarettes. She started smoking about 9 years ago. She smoked an average of .5 packs per day. She has never used smokeless tobacco. She reports that she does not drink alcohol and does not use drugs.  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 Risks, benefits, and alternatives discussed and the patient would like to continue with surgery.  -OR today -3C post-op  Judith Part, MD 09/10/22 2:10 PM

## 2022-09-10 NOTE — Op Note (Signed)
PATIENT: Alejandra Barr  DAY OF SURGERY: 09/10/22   PRE-OPERATIVE DIAGNOSIS:  Thoracic nerve sheath tumor   POST-OPERATIVE DIAGNOSIS:  Thoracic intradural tumor   PROCEDURE:  T11 laminectomy, resection of intradural tumor, use of intraoperative neuromonitoring   SURGEON:  Surgeon(s) and Role:    Judith Part, MD - Primary    Consuella Lose, MD - Assisting   ANESTHESIA: ETGA   BRIEF HISTORY: This is a 36 year old woman who presented with predominantly back pain. Workup was only notable for a thoracic intradural mass, which was most consistent with a thoracic nerve sheath tumor. We discussed this extensively including the uncertainty of whether or not this was the cause of her pain. We discussed options at length including continued observation with serial imaging and resection and she was quite adamant about resection. We discussed this including risks, benefits, and alternatives and she wished to proceed with surgical resection.    OPERATIVE DETAIL: The patient was taken to the operating room, anesthesia was induced by the anesthesia team, and the patient was placed on the OR table in the prone position. A formal time out was performed with two patient identifiers and confirmed the operative site. Intraoperative neuromonitoring leads were placed with good baselines. The operative site was marked, hair was clipped with surgical clippers, the area was then prepped and draped in a sterile fashion. Fluoroscopy was used to localize the T11 pedicles and lamina. A midline incision was created and subperiosteal dissection was performed bilaterally and fluoroscopy was used to confirm localization again.   A T11 laminectomy was performed along with partial right T11-12 facetectomy with a combination of high speed drill and curettes. A midline durotomy was created and sutures were used to retract the dural edges. The microscope was draped in a sterile fashion and brought into the field. The  tumor was identified and intradural along the lateral aspect of the thecal sac. 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 It was dissected circumferentially and resected then and sent to pathology. SSEPs and MEPs were stable during and after resection.   The wound was copiously irrigated, hemostasis was confirmed, the dura was closed with suture with water tight seal following Valsalva. Adheris was then placed over the laminectomy defect to buttress the dural closure.   The wound was copiously irrigated, all instrument and sponge counts were correct, and the incision was then closed in layers. The patient was then returned to anesthesia for emergence. No apparent complications at the completion of the procedure.   EBL:  174m   DRAINS: none   SPECIMENS: Thoracic intradural tumor   TJudith Part MD 09/10/22 2:40 PM

## 2022-09-10 NOTE — Anesthesia Procedure Notes (Addendum)
Procedure Name: Intubation Date/Time: 09/10/2022 2:29 PM  Performed by: Dorann Lodge, CRNAPre-anesthesia Checklist: Patient identified, Emergency Drugs available, Suction available and Patient being monitored Patient Re-evaluated:Patient Re-evaluated prior to induction Oxygen Delivery Method: Circle System Utilized Preoxygenation: Pre-oxygenation with 100% oxygen Induction Type: IV induction Ventilation: Mask ventilation without difficulty Laryngoscope Size: Mac and 3 Grade View: Grade I Tube type: Oral Tube size: 7.0 mm Number of attempts: 1 Airway Equipment and Method: Stylet Placement Confirmation: ETT inserted through vocal cords under direct vision, positive ETCO2 and breath sounds checked- equal and bilateral Secured at: 22 cm Tube secured with: Tape Dental Injury: Teeth and Oropharynx as per pre-operative assessment

## 2022-09-10 NOTE — Progress Notes (Signed)
Neurosurgery Service Post-operative progress note  Assessment & Plan: 36 y.o. woman s/p thoracic lami for intradural tumor, seen in PACU, MAEx4 equally.  -3C -diet as tolerated -activity as tolerate, no brace needed -PT/OT in AM  Alejandra Barr Alejandra Barr  09/10/22 5:08 PM

## 2022-09-10 NOTE — Anesthesia Preprocedure Evaluation (Addendum)
Anesthesia Evaluation  Patient identified by MRN, date of birth, ID band Patient awake    Reviewed: Allergy & Precautions, NPO status , Patient's Chart, lab work & pertinent test results  History of Anesthesia Complications (+) PONV and history of anesthetic complications  Airway Mallampati: II  TM Distance: >3 FB Neck ROM: Full    Dental  (+) Dental Advisory Given, Upper Dentures, Lower Dentures   Pulmonary asthma , former smoker   Pulmonary exam normal breath sounds clear to auscultation       Cardiovascular Pt. on home beta blockers Normal cardiovascular exam+ dysrhythmias (s/p RFA) Atrial Fibrillation  Rhythm:Regular Rate:Normal     Neuro/Psych  Headaches PSYCHIATRIC DISORDERS Anxiety Depression    T11 Schwannoma  Neuromuscular disease    GI/Hepatic negative GI ROS, Neg liver ROS,,,  Endo/Other  diabetesHypothyroidism  Obesity   Renal/GU negative Renal ROS     Musculoskeletal negative musculoskeletal ROS (+)    Abdominal   Peds  Hematology negative hematology ROS (+)   Anesthesia Other Findings Day of surgery medications reviewed with the patient.  Reproductive/Obstetrics                             Anesthesia Physical Anesthesia Plan  ASA: 3  Anesthesia Plan: General   Post-op Pain Management: Tylenol PO (pre-op)*   Induction: Intravenous  PONV Risk Score and Plan: 4 or greater and Scopolamine patch - Pre-op, Midazolam, Dexamethasone, Ondansetron and TIVA  Airway Management Planned: Oral ETT  Additional Equipment:   Intra-op Plan:   Post-operative Plan: Extubation in OR  Informed Consent: I have reviewed the patients History and Physical, chart, labs and discussed the procedure including the risks, benefits and alternatives for the proposed anesthesia with the patient or authorized representative who has indicated his/her understanding and acceptance.     Dental  advisory given  Plan Discussed with: CRNA  Anesthesia Plan Comments: (2nd PIV)        Anesthesia Quick Evaluation

## 2022-09-10 NOTE — Transfer of Care (Signed)
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  Last Pain:  Vitals:   09/10/22 1146  TempSrc:   PainSc: 8          Complications: No notable events documented.

## 2022-09-11 ENCOUNTER — Inpatient Hospital Stay (HOSPITAL_COMMUNITY): Payer: BC Managed Care – PPO

## 2022-09-11 ENCOUNTER — Encounter (HOSPITAL_COMMUNITY): Payer: Self-pay | Admitting: Neurological Surgery

## 2022-09-11 MED ORDER — GADOBUTROL 1 MMOL/ML IV SOLN
10.0000 mL | Freq: Once | INTRAVENOUS | Status: AC | PRN
  Administered 2022-09-11: 10 mL via INTRAVENOUS

## 2022-09-11 MED ORDER — CYCLOBENZAPRINE HCL 10 MG PO TABS: 10.0000 mg | ORAL_TABLET | Freq: Three times a day (TID) | ORAL | 0 refills | Status: DC | PRN

## 2022-09-11 MED ORDER — DIAZEPAM 5 MG PO TABS
5.0000 mg | ORAL_TABLET | Freq: Once | ORAL | Status: AC
  Administered 2022-09-11: 5 mg via ORAL
  Filled 2022-09-11: qty 1

## 2022-09-11 MED ORDER — SODIUM CHLORIDE 0.9% FLUSH: 3.0000 mL | Freq: Two times a day (BID) | INTRAVENOUS | Status: DC

## 2022-09-11 MED ORDER — OXYCODONE HCL 5 MG PO TABS: 5.0000 mg | ORAL_TABLET | ORAL | 0 refills | Status: DC | PRN

## 2022-09-11 NOTE — Discharge Summary (Signed)
Discharge Summary  Date of Admission: 09/10/2022  Date of Discharge: 09/11/22  Attending Physician: Emelda Brothers, MD  Hospital Course: Patient was admitted following an uncomplicated thoracic laminectomy for resection of an intradural tumor. They were recovered in PACU and transferred to [facility]. Their hospital course was uncomplicated and the patient was discharged home on 09/11/22. They will follow up in clinic with me in clinic in 2 weeks.  Neurologic exam at discharge:  Strength 5/5 x4 and SILTx4 except baseline R foot numbness  Discharge diagnosis: Thoracic intraspinal tumor  Judith Part, MD 09/11/22 8:14 AM

## 2022-09-11 NOTE — Progress Notes (Signed)
Neurosurgery Service Progress Note  Subjective: No acute events overnight. Radicular pain resolved, back pain more incisional than deep aching compared to preop   Objective: Vitals:   09/10/22 1843 09/10/22 1929 09/10/22 2314 09/11/22 0326  BP: (!) 106/59 (!) 98/58 109/69 104/60  Pulse: 91 88 (!) 110 83  Resp: '16 18 18 18  '$ Temp: 98.8 F (37.1 C) 99.6 F (37.6 C) 99.5 F (37.5 C) 98.6 F (37 C)  TempSrc: Oral Oral Oral Oral  SpO2: 100% 95% 99% 100%  Weight:      Height:        Physical Exam: Strength 5/5 x4 and SILTx4 except R foot numbness, incision c/d/i  Assessment & Plan: 35 y.o. woman s/p thoracic lami for resection of tumor, recovering well.  -MRI T-spine w/wo to confirm resection / get baseline -discharge after MRI  Judith Part  09/11/22 8:04 AM

## 2022-09-11 NOTE — Anesthesia Postprocedure Evaluation (Signed)
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  Last Vitals:  Vitals:   09/11/22 0326 09/11/22 0852  BP: 104/60 114/77  Pulse: 83 (!) 104  Resp: 18 16  Temp: 37 C   SpO2: 100% 98%    Last Pain:  Vitals:   09/11/22 1105  TempSrc:   PainSc: [name]

## 2022-09-11 NOTE — Evaluation (Signed)
Physical Therapy Evaluation Patient Details Name: Alejandra Barr MRN: [id number] DOB: February 28, 1986 Today's Date: 09/11/2022  History of Present Illness  36 yo female s/p 11/9 thoracic intradural tumoral resection, T11 laminectomy. PMH asthma, chronic anxiety, chronic depression, DM, hypothryroidism,  Clinical Impression  Patient admitted following the above procedure. PTA, patient lives with husband and 30 young kids and was primarily in bed due to significant pain. Patient complaining of numbness and tingling in B LE. Patient presents with weakness, impaired balance, and decreased activity tolerance. Educated on back precautions and activity progression, patient verbalized understanding. Ambulating in hallway with min guard and RW. Patient will benefit from skilled PT services during acute stay to address listed deficits. No PT follow up recommended at this time.        Recommendations for follow up therapy are one component of a multi-disciplinary discharge planning process, led by the attending physician.  Recommendations may be updated based on patient status, additional functional criteria and insurance authorization.  Follow Up Recommendations No PT follow up      Assistance Recommended at Discharge Intermittent Supervision/Assistance  Patient can return home with the following  A little help with walking and/or transfers;A little help with bathing/dressing/bathroom;Assistance with cooking/housework;Assist for transportation;Help with stairs or ramp for entrance    Equipment Recommendations Rolling Alejandra Barr (2 wheels)  Recommendations for Other Services       Functional Status Assessment Patient has had a recent decline in their functional status and demonstrates the ability to make significant improvements in function in a reasonable and predictable amount of time.     Precautions / Restrictions Precautions Precautions: Back Precaution Booklet Issued: Yes (comment) Precaution  Comments: back handout provided and reviewed with pt for adls. Restrictions Weight Bearing Restrictions: No      Mobility  Bed Mobility               General bed mobility comments: seated EOB on arrival    Transfers Overall transfer level: Needs assistance   Transfers: Sit to/from Stand Sit to Stand: Supervision           General transfer comment: increased time to complete    Ambulation/Gait Ambulation/Gait assistance: Min guard Gait Distance (Feet): 200 Feet Assistive device: Rolling Alejandra Barr (2 wheels) Gait Pattern/deviations: Step-through pattern, Decreased stride length Gait velocity: decreased     General Gait Details: min guard for safety. complaining of increasing numbness in L LE with prolonged standing  Stairs            Wheelchair Mobility    Modified Rankin (Stroke Patients Only)       Balance Overall balance assessment: Needs assistance Sitting-balance support: No upper extremity supported, Feet supported Sitting balance-Leahy Scale: Good     Standing balance support: Bilateral upper extremity supported, Reliant on assistive device for balance Standing balance-Leahy Scale: Poor Standing balance comment: reliant on UE support                             Pertinent Vitals/Pain Pain Assessment Pain Assessment: Faces Faces Pain Scale: Hurts whole lot Pain Location: back Pain Descriptors / Indicators: Discomfort, Grimacing, Guarding Pain Intervention(s): Monitored during session    Home Living Family/patient expects to be discharged to:: Private residence Living Arrangements: Spouse/significant other;Children Available Help at Discharge: Family;Available 24 hours/day Type of Home: House Home Access: Ramped entrance       Home Layout: Two level;Able to live on main level with bedroom/bathroom;Full bath on  main level Home Equipment: None (has access per mother in the room to DME: [facility] RW 4WW if needed) Additional  Comments: has 4 small children - 6yo, twin 3 yr and 67 mo daughter. spouse is stay at home dad so can give help. they have 68 yo dog that requires (A) and spouse can manage    Prior Function Prior Level of Function : Independent/Modified Independent;Working/employed;Driving             Mobility Comments: reports being in bed alot in the last couple months due to back pain. ADLs Comments: pt reports working from home in the bed level due to pain     Hand Dominance   Dominant Hand: Right    Extremity/Trunk Assessment   Upper Extremity Assessment Upper Extremity Assessment: Defer to OT evaluation    Lower Extremity Assessment Lower Extremity Assessment: RLE deficits/detail RLE Deficits / Details: reports the most pain on this side and numbness from buttock to thigh, in calf and foot LLE Deficits / Details: reports numbnes in foot    Cervical / Trunk Assessment Cervical / Trunk Assessment: Back Surgery  Communication   Communication: No difficulties  Cognition Arousal/Alertness: Awake/alert Behavior During Therapy: WFL for tasks assessed/performed Overall Cognitive Status: Within Functional Limits for tasks assessed                                          General Comments General comments (skin integrity, edema, etc.): dressing dry    Exercises     Assessment/Plan    PT Assessment Patient needs continued PT services  PT Problem List Decreased strength;Decreased balance;Decreased activity tolerance;Decreased mobility;Decreased knowledge of precautions;Impaired sensation       PT Treatment Interventions DME instruction;Gait training;Therapeutic activities;Therapeutic exercise;Functional mobility training;Balance training;Patient/family education    PT Goals (Current goals can be found in the Care Plan section)  Acute Rehab PT Goals Patient Stated Goal: to get stronger and be able to play with my kids PT Goal Formulation: With patient Time For Goal  Achievement: 09/25/22 Potential to Achieve Goals: Good    Frequency Min 5X/week     Co-evaluation               AM-PAC PT "6 Clicks" Mobility  Outcome Measure Help needed turning from your back to your side while in a flat bed without using bedrails?: A Little Help needed moving from lying on your back to sitting on the side of a flat bed without using bedrails?: A Little Help needed moving to and from a bed to a chair (including a wheelchair)?: A Little Help needed standing up from a chair using your arms (e.g., wheelchair or bedside chair)?: A Little Help needed to walk in hospital room?: A Little Help needed climbing 3-5 steps with a railing? : A Lot 6 Click Score: 17    End of Session Equipment Utilized During Treatment: Gait belt Activity Tolerance: Patient tolerated treatment well Patient left: in bed;with call bell/phone within reach (seated EOB) Nurse Communication: Mobility status PT Visit Diagnosis: Muscle weakness (generalized) (M62.81);Unsteadiness on feet (R26.81);Other symptoms and signs involving the nervous system (V40.981)    Time: 1914-7829 PT Time Calculation (min) (ACUTE ONLY): 18 min   Charges:   PT Evaluation $PT Eval Moderate Complexity: 1 Mod          Avanell Banwart A. Gilford Rile PT, DPT Acute Rehabilitation Services Office [phone]   Chesley Noon  A Cora Brierley 09/11/2022, 9:47 AM

## 2022-09-11 NOTE — Discharge Instructions (Signed)
  Call Your Doctor If Any of These Occur Redness, drainage, or swelling at the wound.  Temperature greater than 101 degrees. Severe pain not relieved by pain medication. Incision starts to come apart. Marland Kitchen

## 2022-09-11 NOTE — Plan of Care (Signed)
  Problem: Education: Goal: Ability to verbalize activity precautions or restrictions will improve Outcome: Completed/Met Goal: Knowledge of the prescribed therapeutic regimen will improve Outcome: Completed/Met Goal: Understanding of discharge needs will improve Outcome: Completed/Met   Problem: Activity: Goal: Ability to avoid complications of mobility impairment will improve Outcome: Completed/Met Goal: Ability to tolerate increased activity will improve Outcome: Completed/Met Goal: Will remain free from falls Outcome: Completed/Met   Problem: Bowel/Gastric: Goal: Gastrointestinal status for postoperative course will improve Outcome: Completed/Met   Problem: Clinical Measurements: Goal: Ability to maintain clinical measurements within normal limits will improve Outcome: Completed/Met Goal: Postoperative complications will be avoided or minimized Outcome: Completed/Met Goal: Diagnostic test results will improve Outcome: Completed/Met   Problem: Pain Management: Goal: Pain level will decrease Outcome: Completed/Met   Problem: Skin Integrity: Goal: Will show signs of wound healing Outcome: Completed/Met   Problem: Health Behavior/Discharge Planning: Goal: Identification of resources available to assist in meeting health care needs will improve Outcome: Completed/Met   Problem: Bladder/Genitourinary: Goal: Urinary functional status for postoperative course will improve Outcome: Completed/Met   Problem: Education: Goal: Knowledge of General Education information will improve Description: Including pain rating scale, medication(s)/side effects and non-pharmacologic comfort measures Outcome: Completed/Met

## 2022-09-11 NOTE — Progress Notes (Signed)
Patient alert and oriented, voiding adequately, skin clean, dry and intact without evidence of skin break down, or symptoms of complications - no redness or edema noted, only slight tenderness at site.  Patient states pain is manageable at time of discharge. Patient has an appointment with MD in 2 weeks 

## 2022-09-11 NOTE — Evaluation (Signed)
Occupational Therapy Evaluation Patient Details Name: Alejandra Barr MRN: [id number] DOB: [birth year] Today's Date: 09/11/2022   History of Present Illness 36 yo female s/p 11/9 thoracic intradural tumoral resection, T11 laminectomy PMH asthma, chronic anxiety, chronic depression, DM, hypothryroidism,   Clinical Impression   Patient is s/p thoracic tumor resection surgery resulting in functional limitations due to the deficits listed below (see OT problem list). Pt currently with pain and needs (A) for transfers reaching for environmental supports. Pt educated in detail about back precautions with home setup / children.  Patient will benefit from skilled OT acutely to increase independence and safety with ADLS to allow discharge home.       Recommendations for follow up therapy are one component of a multi-disciplinary discharge planning process, led by the attending physician.  Recommendations may be updated based on patient status, additional functional criteria and insurance authorization.   Follow Up Recommendations  No OT follow up    Assistance Recommended at Discharge Set up Supervision/Assistance  Patient can return home with the following A little help with bathing/dressing/bathroom;Assist for transportation    Functional Status Assessment  Patient has had a recent decline in their functional status and demonstrates the ability to make significant improvements in function in a reasonable and predictable amount of time.  Equipment Recommendations  Other (comment) (RW)    Recommendations for Other Services       Precautions / Restrictions Precautions Precautions: Back Precaution Comments: back handout provided and reviewed with pt for adls.      Mobility Bed Mobility Overal bed mobility: Needs Assistance Bed Mobility: Sit to Supine, Supine to Sit     Supine to sit: Supervision Sit to supine: Min assist   General bed mobility comments: educated on log roll and how  to progress    Transfers Overall transfer level: Needs assistance   Transfers: Sit to/from Stand Sit to Stand: Supervision                  Balance                                           ADL either performed or assessed with clinical judgement   ADL Overall ADL's : Needs assistance/impaired Eating/Feeding: Set up   Grooming: Applying deodorant   Upper Body Bathing: Set up   Lower Body Bathing: Minimal assistance;Sit to/from stand   Upper Body Dressing : Set up   Lower Body Dressing: Minimal assistance;Cueing for back precautions;Sit to/from stand;With adaptive equipment Lower Body Dressing Details (indicate cue type and reason): educated on AE as options but can figure 4 cross with effort. advised not to pull on leg but if able to complete this position. advised on AE to help aid in pain management Toilet Transfer: Supervision/safety           Functional mobility during ADLs: Minimal assistance General ADL Comments: pt with pain with transfer to commode. pt could benefit from elevated surface. pt reaching for therapists so provided hand held. OT communicated to pt and PT that trial on RW was advised for progression with pain management upright posture  Back handout provided and reviewed adls in detail. Pt educated on: set an alarm at night for medication, avoid sitting for long periods of time, correct bed positioning for sleeping, correct sequence for bed mobility, avoiding lifting more than 5 pounds and never wash directly  over incision. All education is complete and patient indicates understanding.    Vision Baseline Vision/History: 0 No visual deficits       Perception     Praxis      Pertinent Vitals/Pain Pain Assessment Pain Assessment: Faces Pain Score: 8  Faces Pain Scale: Hurts whole lot Pain Location: back Pain Descriptors / Indicators: Discomfort, Grimacing, Guarding Pain Intervention(s): Monitored during session, Limited  activity within patient's tolerance, Repositioned     Hand Dominance Right   Extremity/Trunk Assessment Upper Extremity Assessment Upper Extremity Assessment: Overall WFL for tasks assessed (reports pain with over head reaching so avoids it)   Lower Extremity Assessment Lower Extremity Assessment: RLE deficits/detail;LLE deficits/detail RLE Deficits / Details: reports the most pain on this side and numbness from buttock to thigh, in calf and foot LLE Deficits / Details: reports numbnes in foot   Cervical / Trunk Assessment Cervical / Trunk Assessment: Back Surgery   Communication Communication Communication: No difficulties   Cognition Arousal/Alertness: Awake/alert Behavior During Therapy: WFL for tasks assessed/performed Overall Cognitive Status: Within Functional Limits for tasks assessed                                       General Comments  dressing dry    Exercises     Shoulder Instructions      Home Living Family/patient expects to be discharged to:: Private residence Living Arrangements: Spouse/significant other;Children Available Help at Discharge: Family;Available 24 hours/day Type of Home: House Home Access: Ramped entrance     Home Layout: Two level;Able to live on main level with bedroom/bathroom;Full bath on main level     Bathroom Shower/Tub: Occupational psychologist: Standard     Home Equipment: None (has access per mother in the room to DME: [facility] RW 4WW if needed)   Additional Comments: has 4 small children - 33yo, twin 3 yr and 20 mo daughter. spouse is stay at home dad so can give help. they have 67 yo dog that requires (A) and spouse can manage      Prior Functioning/Environment Prior Level of Function : Independent/Modified Independent;Working/employed;Driving             Mobility Comments: reports being in bed alot in the last couple months due to back pain. ADLs Comments: pt reports working from home in the  bed level due to pain        OT Problem List: Decreased strength;Decreased activity tolerance;Impaired balance (sitting and/or standing);Decreased safety awareness;Decreased knowledge of use of DME or AE;Decreased knowledge of precautions;Obesity;Pain      OT Treatment/Interventions: Self-care/ADL training;Therapeutic exercise;Neuromuscular education;DME and/or AE instruction;Therapeutic activities;Patient/family education;Balance training    OT Goals(Current goals can be found in the care plan section) Acute Rehab OT Goals Patient Stated Goal: to be able to take care of children OT Goal Formulation: With patient Time For Goal Achievement: 09/25/22 Potential to Achieve Goals: Good  OT Frequency: Min 2X/week    Co-evaluation              AM-PAC OT "6 Clicks" Daily Activity     Outcome Measure Help from another person eating meals?: None Help from another person taking care of personal grooming?: None Help from another person toileting, which includes using toliet, bedpan, or urinal?: A Little Help from another person bathing (including washing, rinsing, drying)?: A Little Help from another person to put on and taking off  regular upper body clothing?: None Help from another person to put on and taking off regular lower body clothing?: None 6 Click Score: 22   End of Session Nurse Communication: Mobility status;Precautions  Activity Tolerance: Patient tolerated treatment well Patient left: in bed;with call bell/phone within reach;with family/visitor present  OT Visit Diagnosis: Unsteadiness on feet (R26.81);Muscle weakness (generalized) (M62.81);Pain Pain - Right/Left: Right Pain - part of body: Leg                Time: 5631-4970 OT Time Calculation (min): 30 min Charges:  OT General Charges $OT Visit: 1 Visit OT Evaluation $OT Eval Moderate Complexity: 1 Mod OT Treatments $Self Care/Home Management : 8-22 mins   Brynn, OTR/L  Acute Rehabilitation Services Office:  [phone] .   Jeri Modena 09/11/2022, 9:18 AM

## 2022-09-12 ENCOUNTER — Inpatient Hospital Stay (HOSPITAL_COMMUNITY)
Admission: EM | Admit: 2022-09-12 | Discharge: 2022-09-17 | DRG: 552 | Disposition: A | Discharge status: Home / Self Care | Payer: BC Managed Care – PPO | Attending: Neurological Surgery | Admitting: Neurological Surgery

## 2022-09-12 ENCOUNTER — Observation Stay (HOSPITAL_COMMUNITY): Payer: BC Managed Care – PPO

## 2022-09-12 ENCOUNTER — Encounter (HOSPITAL_COMMUNITY): Payer: Self-pay | Admitting: Emergency Medicine

## 2022-09-12 ENCOUNTER — Other Ambulatory Visit: Payer: Self-pay

## 2022-09-12 ENCOUNTER — Emergency Department (HOSPITAL_COMMUNITY): Payer: BC Managed Care – PPO

## 2022-09-12 DIAGNOSIS — M40204 Unspecified kyphosis, thoracic region: Secondary | ICD-10-CM | POA: Diagnosis not present

## 2022-09-12 DIAGNOSIS — Z8249 Family history of ischemic heart disease and other diseases of the circulatory system: Secondary | ICD-10-CM

## 2022-09-12 DIAGNOSIS — Z9884 Bariatric surgery status: Secondary | ICD-10-CM | POA: Diagnosis not present

## 2022-09-12 DIAGNOSIS — Z6839 Body mass index (BMI) 39.0-39.9, adult: Secondary | ICD-10-CM | POA: Diagnosis not present

## 2022-09-12 DIAGNOSIS — J45909 Unspecified asthma, uncomplicated: Secondary | ICD-10-CM | POA: Diagnosis not present

## 2022-09-12 DIAGNOSIS — M5442 Lumbago with sciatica, left side: Secondary | ICD-10-CM | POA: Diagnosis present

## 2022-09-12 DIAGNOSIS — Z885 Allergy status to narcotic agent status: Secondary | ICD-10-CM

## 2022-09-12 DIAGNOSIS — G8929 Other chronic pain: Secondary | ICD-10-CM | POA: Diagnosis not present

## 2022-09-12 DIAGNOSIS — M4804 Spinal stenosis, thoracic region: Secondary | ICD-10-CM | POA: Diagnosis present

## 2022-09-12 DIAGNOSIS — M549 Dorsalgia, unspecified: Secondary | ICD-10-CM | POA: Diagnosis not present

## 2022-09-12 DIAGNOSIS — Z79899 Other long term (current) drug therapy: Secondary | ICD-10-CM

## 2022-09-12 DIAGNOSIS — R0689 Other abnormalities of breathing: Secondary | ICD-10-CM | POA: Diagnosis not present

## 2022-09-12 DIAGNOSIS — I959 Hypotension, unspecified: Secondary | ICD-10-CM | POA: Diagnosis not present

## 2022-09-12 DIAGNOSIS — T68XXXA Hypothermia, initial encounter: Secondary | ICD-10-CM | POA: Diagnosis not present

## 2022-09-12 DIAGNOSIS — F32A Depression, unspecified: Secondary | ICD-10-CM | POA: Diagnosis present

## 2022-09-12 DIAGNOSIS — I4892 Unspecified atrial flutter: Secondary | ICD-10-CM | POA: Diagnosis present

## 2022-09-12 DIAGNOSIS — R200 Anesthesia of skin: Secondary | ICD-10-CM | POA: Diagnosis not present

## 2022-09-12 DIAGNOSIS — Z880 Allergy status to penicillin: Secondary | ICD-10-CM

## 2022-09-12 DIAGNOSIS — Z881 Allergy status to other antibiotic agents status: Secondary | ICD-10-CM

## 2022-09-12 DIAGNOSIS — E039 Hypothyroidism, unspecified: Secondary | ICD-10-CM | POA: Diagnosis present

## 2022-09-12 DIAGNOSIS — E669 Obesity, unspecified: Secondary | ICD-10-CM | POA: Diagnosis not present

## 2022-09-12 DIAGNOSIS — M5416 Radiculopathy, lumbar region: Secondary | ICD-10-CM | POA: Diagnosis not present

## 2022-09-12 DIAGNOSIS — I4891 Unspecified atrial fibrillation: Secondary | ICD-10-CM | POA: Diagnosis present

## 2022-09-12 DIAGNOSIS — Z87891 Personal history of nicotine dependence: Secondary | ICD-10-CM

## 2022-09-12 DIAGNOSIS — M5459 Other low back pain: Secondary | ICD-10-CM | POA: Diagnosis not present

## 2022-09-12 DIAGNOSIS — M5441 Lumbago with sciatica, right side: Secondary | ICD-10-CM | POA: Diagnosis not present

## 2022-09-12 DIAGNOSIS — R000 Tachycardia, unspecified: Secondary | ICD-10-CM | POA: Diagnosis not present

## 2022-09-12 DIAGNOSIS — R531 Weakness: Secondary | ICD-10-CM

## 2022-09-12 LAB — CBC WITH DIFFERENTIAL/PLATELET
Abs Immature Granulocytes: 0.08 10*3/uL — ABNORMAL HIGH (ref 0.00–0.07)
Basophils Absolute: 0.1 10*3/uL (ref 0.0–0.1)
Basophils Relative: 1 %
Eosinophils Absolute: 0.4 10*3/uL (ref 0.0–0.5)
Eosinophils Relative: 4 %
HCT: 39.9 % (ref 36.0–46.0)
Hemoglobin: 12.9 g/dL (ref 12.0–15.0)
Immature Granulocytes: 1 %
Lymphocytes Relative: 15 %
Lymphs Abs: 1.4 10*3/uL (ref 0.7–4.0)
MCH: 30.1 pg (ref 26.0–34.0)
MCHC: 32.3 g/dL (ref 30.0–36.0)
MCV: 93 fL (ref 80.0–100.0)
Monocytes Absolute: 0.5 10*3/uL (ref 0.1–1.0)
Monocytes Relative: 5 %
Neutro Abs: 7.4 10*3/uL (ref 1.7–7.7)
Neutrophils Relative %: 74 %
Platelets: 270 10*3/uL (ref 150–400)
RBC: 4.29 MIL/uL (ref 3.87–5.11)
RDW: 13.1 % (ref 11.5–15.5)
WBC: 9.9 10*3/uL (ref 4.0–10.5)
nRBC: 0 % (ref 0.0–0.2)

## 2022-09-12 LAB — URINALYSIS, ROUTINE W REFLEX MICROSCOPIC
Bilirubin Urine: NEGATIVE
Glucose, UA: NEGATIVE mg/dL
Hgb urine dipstick: NEGATIVE
Ketones, ur: 5 mg/dL — AB
Leukocytes,Ua: NEGATIVE
Nitrite: NEGATIVE
Protein, ur: NEGATIVE mg/dL
Specific Gravity, Urine: 1.02 (ref 1.005–1.030)
pH: 6 (ref 5.0–8.0)

## 2022-09-12 LAB — COMPREHENSIVE METABOLIC PANEL
ALT: 147 U/L — ABNORMAL HIGH (ref 0–44)
AST: 138 U/L — ABNORMAL HIGH (ref 15–41)
Albumin: 3.6 g/dL (ref 3.5–5.0)
Alkaline Phosphatase: 253 U/L — ABNORMAL HIGH (ref 38–126)
Anion gap: 12 (ref 5–15)
BUN: 12 mg/dL (ref 6–20)
CO2: 24 mmol/L (ref 22–32)
Calcium: 9.4 mg/dL (ref 8.9–10.3)
Chloride: 103 mmol/L (ref 98–111)
Creatinine, Ser: 0.65 mg/dL (ref 0.44–1.00)
GFR, Estimated: >60 mL/min (ref >60)
Glucose, Bld: 103 mg/dL — ABNORMAL HIGH (ref 70–99)
Potassium: 3.9 mmol/L (ref 3.5–5.1)
Sodium: 139 mmol/L (ref 135–145)
Total Bilirubin: 0.4 mg/dL (ref 0.3–1.2)
Total Protein: 6.6 g/dL (ref 6.5–8.1)

## 2022-09-12 MED ORDER — PHENTERMINE HCL 37.5 MG PO TABS: 37.5000 mg | ORAL_TABLET | Freq: Every day | ORAL | Status: DC

## 2022-09-12 MED ORDER — HYDROCODONE-ACETAMINOPHEN 5-325 MG PO TABS: 1.0000 | ORAL_TABLET | ORAL | Status: DC | PRN

## 2022-09-12 MED ORDER — HYDROMORPHONE HCL 1 MG/ML IJ SOLN
1.0000 mg | Freq: Once | INTRAMUSCULAR | Status: AC
  Administered 2022-09-12: 1 mg via INTRAVENOUS
  Filled 2022-09-12: qty 1

## 2022-09-12 MED ORDER — LACTATED RINGERS IV BOLUS
1000.0000 mL | Freq: Once | INTRAVENOUS | Status: AC
  Administered 2022-09-12: 1000 mL via INTRAVENOUS

## 2022-09-12 MED ORDER — LEVOTHYROXINE SODIUM 75 MCG PO TABS
75.0000 ug | ORAL_TABLET | Freq: Every day | ORAL | Status: DC
  Administered 2022-09-13 – 2022-09-17 (×5): 75 ug via ORAL
  Filled 2022-09-12 (×6): qty 1

## 2022-09-12 MED ORDER — TOPIRAMATE 25 MG PO TABS
100.0000 mg | ORAL_TABLET | Freq: Two times a day (BID) | ORAL | Status: DC
  Administered 2022-09-12 – 2022-09-17 (×10): 100 mg via ORAL
  Filled 2022-09-12 (×10): qty 4

## 2022-09-12 MED ORDER — DULOXETINE HCL 30 MG PO CPEP
30.0000 mg | ORAL_CAPSULE | Freq: Two times a day (BID) | ORAL | Status: DC
  Administered 2022-09-12 – 2022-09-17 (×10): 30 mg via ORAL
  Filled 2022-09-12 (×10): qty 1

## 2022-09-12 MED ORDER — CYCLOBENZAPRINE HCL 10 MG PO TABS
10.0000 mg | ORAL_TABLET | Freq: Three times a day (TID) | ORAL | Status: DC | PRN
  Administered 2022-09-12 – 2022-09-17 (×12): 10 mg via ORAL
  Filled 2022-09-12 (×12): qty 1

## 2022-09-12 MED ORDER — OXYCODONE HCL 5 MG PO TABS: 5.0000 mg | ORAL_TABLET | ORAL | Status: DC | PRN

## 2022-09-12 MED ORDER — HYDROMORPHONE HCL 1 MG/ML IJ SOLN
0.5000 mg | Freq: Once | INTRAMUSCULAR | Status: AC
  Administered 2022-09-12: 0.5 mg via INTRAMUSCULAR
  Filled 2022-09-12: qty 1

## 2022-09-12 MED ORDER — METOPROLOL SUCCINATE ER 25 MG PO TB24
25.0000 mg | ORAL_TABLET | Freq: Every day | ORAL | Status: DC
  Administered 2022-09-13 – 2022-09-17 (×5): 25 mg via ORAL
  Filled 2022-09-12 (×5): qty 1

## 2022-09-12 MED ORDER — ONDANSETRON HCL 4 MG/2ML IJ SOLN: 4.0000 mg | Freq: Four times a day (QID) | INTRAMUSCULAR | Status: DC | PRN

## 2022-09-12 MED ORDER — GABAPENTIN 300 MG PO CAPS
300.0000 mg | ORAL_CAPSULE | Freq: Two times a day (BID) | ORAL | Status: DC
  Administered 2022-09-12 – 2022-09-17 (×10): 300 mg via ORAL
  Filled 2022-09-12 (×10): qty 1

## 2022-09-12 MED ORDER — ONDANSETRON HCL 4 MG PO TABS: 4.0000 mg | ORAL_TABLET | Freq: Four times a day (QID) | ORAL | Status: DC | PRN

## 2022-09-12 MED ORDER — ACETAMINOPHEN 650 MG RE SUPP: 650.0000 mg | Freq: Four times a day (QID) | RECTAL | Status: DC | PRN

## 2022-09-12 MED ORDER — HYDROMORPHONE HCL 1 MG/ML IJ SOLN
0.5000 mg | INTRAMUSCULAR | Status: DC | PRN
  Administered 2022-09-12 – 2022-09-17 (×23): 0.5 mg via INTRAVENOUS
  Filled 2022-09-12: qty 1
  Filled 2022-09-12 (×13): qty 0.5
  Filled 2022-09-12: qty 1
  Filled 2022-09-12 (×6): qty 0.5
  Filled 2022-09-12: qty 1
  Filled 2022-09-12: qty 0.5
  Filled 2022-09-12: qty 1

## 2022-09-12 MED ORDER — DOCUSATE SODIUM 100 MG PO CAPS
100.0000 mg | ORAL_CAPSULE | Freq: Two times a day (BID) | ORAL | Status: DC
  Administered 2022-09-12 – 2022-09-17 (×10): 100 mg via ORAL
  Filled 2022-09-12 (×10): qty 1

## 2022-09-12 MED ORDER — ACETAMINOPHEN 325 MG PO TABS
650.0000 mg | ORAL_TABLET | Freq: Four times a day (QID) | ORAL | Status: DC | PRN
  Administered 2022-09-16: 650 mg via ORAL
  Filled 2022-09-12: qty 2

## 2022-09-12 NOTE — ED Provider Notes (Signed)
  Physical Exam  BP 113/73   Pulse 98   Temp 98.4 F (36.9 C) (Oral)   Resp 14   Ht 1.549 m ('5\' 1"'$ )   Wt 94.8 kg   LMP 09/03/2022 (Exact Date)   SpO2 100%   BMI 39.49 kg/m   Physical Exam  Procedures  Procedures  ED Course / MDM   Clinical Course as of 09/12/22 1630  Sat Sep 12, 2022  0704 Spoke with Dr. Reatha Armour of [facility] who does not believe additional imaging is needed unless there is evidence of true neurologic deficit. Agrees with plan for pain control. Their service will consider admission for pain control if unable to be improved in the ED. [KH]    Clinical Course User Index [KH] Antonietta Breach, PA-C   Medical Decision Making Amount and/or Complexity of Data Reviewed Labs: ordered. Radiology: ordered.  Risk Prescription drug management.   36 yo female thoracic surgery 2 days ago Dr. Zada Finders with severe pain radiating down legs, subjective numbness and weakness.  Dr. Sherry Ruffing, Dr. 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 Merla Riches and Discussed MRI findings with him.  Patient is also concerned that she is having increased weakness in her legs mainly with the associated pain. Dr. Baird Lyons will admit for pain management and further imaging      Pattricia Boss, MD 09/12/22 1736

## 2022-09-12 NOTE — ED Provider Notes (Signed)
[facility] EMERGENCY DEPARTMENT Provider Note   CSN: [id number] Arrival date & time: 09/12/22  2119     History  Chief Complaint  Patient presents with   Back Pain    S/P LaminectomyT11/T12    Alejandra Barr is a 36 y.o. female.  The history is provided by the patient and medical records. No language interpreter was used.  Back Pain Location:  Lumbar spine Quality:  Aching Radiates to:  L posterior upper leg and R posterior upper leg Pain severity:  Severe Pain is:  Unable to specify Onset quality:  Gradual Duration:  2 days Timing:  Constant Progression:  Worsening Chronicity:  New Context comment:  Surgery 2 daysa go Worsened by:  Palpation, sitting, touching and standing Ineffective treatments: oxy. Associated symptoms: numbness and weakness   Associated symptoms: no abdominal pain, no chest pain, no dysuria, no fever and no headaches  Bladder incontinence: less urination. Risk factors: recent surgery        Home Medications Prior to Admission medications   Medication Sig Start Date End Date Taking? Authorizing Provider  acetaminophen (TYLENOL) 325 MG tablet Take 650 mg by mouth every 6 (six) hours as needed for mild pain or moderate pain.    [provider]  albuterol (VENTOLIN HFA) 108 (90 Base) MCG/ACT inhaler Inhale 2 puffs into the lungs every 6 (six) hours as needed for wheezing or shortness of breath. 10/20/21   Renee Rival, FNP  Botulinum Toxin Type A (BOTOX) 200 units SOLR 155 Units See admin instructions. INJECT 155 UNITS EVERY THREE MONTHS FOR CHRONIC MIGRAINES.    [provider]  cyanocobalamin (,VITAMIN B-12,) 1000 MCG/ML injection Inject 1 mL (1,000 mcg total) into the muscle once a week. 03/13/22   Harriett Rush, PA-C  cyclobenzaprine (FLEXERIL) 10 MG tablet Take 1 tablet (10 mg total) by mouth 3 (three) times daily as needed for muscle spasms. 09/11/22   Judith Part, MD  DULoxetine  (CYMBALTA) 30 MG capsule Take 30 mg by mouth 2 (two) times daily. 09/17/21   [provider]  gabapentin (NEURONTIN) 300 MG capsule Take 300 mg by mouth 2 (two) times daily.    [provider]  iron polysaccharides (FERREX 150) 150 MG capsule Take 1 capsule (150 mg total) by mouth daily. 03/04/21   Suzan Nailer, CNM  Iron Sucrose (VENOFER IV) Inject into the vein. Every three months    [provider]  levothyroxine (SYNTHROID) 75 MCG tablet Take 75 mcg by mouth daily before breakfast. 09/24/20   [provider]  metoprolol succinate (TOPROL-XL) 25 MG 24 hr tablet TAKE 2 TABLETS DAILY (DOSE INCREASE) 05/12/22   Strader, Fransisco Hertz, PA-C  NEEDLE, DISP, 25 G 25G X 1-1/2" MISC To be used with B 12 injections 06/23/22   Pennington, Rebekah M, PA-C  OVER THE COUNTER MEDICATION BC powder    [provider]  oxyCODONE (OXY IR/ROXICODONE) 5 MG immediate release tablet Take 1 tablet (5 mg total) by mouth every 4 (four) hours as needed for moderate pain ((score 4 to 6)). 09/11/22   Judith Part, MD  phentermine (ADIPEX-P) 37.5 MG tablet Take 1 tablet (37.5 mg total) by mouth daily before breakfast. 08/14/22   Fayrene Helper, MD  Prenatal Vit-Fe Fumarate-FA (PRENATAL PO) Take 1 tablet by mouth daily.    [provider]  topiramate (TOPAMAX) 100 MG tablet Take 100 mg by mouth 2 (two) times daily. 05/31/21   [provider]  UBRELVY 100 MG TABS Take by mouth. 06/18/21   [provider]  Vitamin D, Ergocalciferol, (DRISDOL) 1.25 MG (50000 UNIT) CAPS capsule Take 1 capsule (50,000 Units total) by mouth once a week. 03/13/22   Harriett Rush, PA-C      Allergies    Ciprofloxacin, Morphine and related, and Penicillins    Review of Systems   Review of Systems  Constitutional:  Negative for chills, fatigue and fever.  HENT:  Negative for congestion.   Respiratory:  Negative for cough, chest tightness, shortness of breath and  wheezing.   Cardiovascular:  Negative for chest pain.  Gastrointestinal:  Negative for abdominal pain, constipation, diarrhea, nausea and vomiting.  Genitourinary:  Positive for decreased urine volume. Negative for dysuria, flank pain and frequency. Bladder incontinence: less urination. Musculoskeletal:  Positive for back pain. Negative for neck pain and neck stiffness.  Skin:  Positive for wound (surgical wound). Negative for rash.  Neurological:  Positive for weakness and numbness. Negative for dizziness, light-headedness and headaches.  Psychiatric/Behavioral:  Negative for agitation and confusion.   All other systems reviewed and are negative.   Physical Exam Updated Vital Signs BP 90/65 (BP Location: Right Arm)   Pulse 94   Temp 98.4 F (36.9 C) (Oral)   Resp 17   LMP 09/03/2022 (Exact Date)   SpO2 96%  Physical Exam Vitals and nursing note reviewed.  Constitutional:      General: She is not in acute distress.    Appearance: She is well-developed.  HENT:     Head: Normocephalic and atraumatic.     Nose: Nose normal.     Mouth/Throat:     Mouth: Mucous membranes are dry.  Eyes:     Extraocular Movements: Extraocular movements intact.     Conjunctiva/sclera: Conjunctivae normal.     Pupils: Pupils are equal, round, and reactive to light.  Cardiovascular:     Rate and Rhythm: Normal rate and regular rhythm.     Heart sounds: No murmur heard. Pulmonary:     Effort: Pulmonary effort is normal. No respiratory distress.     Breath sounds: Normal breath sounds. No wheezing, rhonchi or rales.  Chest:     Chest wall: No tenderness.  Abdominal:     General: Abdomen is flat.     Palpations: Abdomen is soft.     Tenderness: There is no abdominal tenderness. There is no guarding or rebound.  Musculoskeletal:        General: Tenderness present. No swelling.     Cervical back: Neck supple.     Lumbar back: Tenderness present.       Back:     Comments: Patient has tenderness  and surgical dressing in her lumbar spine area.  There is tenderness across the low back and buttocks area.  Patient has reported pain radiating down both legs right worse than left and has worsened numbness and weakness in both legs.  She also reports decreased urination.  She is able to move both feet and has sensation in both feet but she reports it is worse than her baseline.  Skin:    General: Skin is warm and dry.     Capillary Refill: Capillary refill takes less than 2 seconds.     Findings: No erythema or rash.  Neurological:     Mental Status: She is alert.     Sensory: Sensory deficit present.     Motor: Weakness present.  Psychiatric:  Mood and Affect: Mood normal.     ED Results / Procedures / Treatments   Labs (all labs ordered are listed, but only abnormal results are displayed) Labs Reviewed  URINALYSIS, ROUTINE W REFLEX MICROSCOPIC - Abnormal; Notable for the following components:      Result Value   APPearance CLOUDY (*)    Ketones, ur 5 (*)    All other components within normal limits  CBC WITH DIFFERENTIAL/PLATELET - Abnormal; Notable for the following components:   Abs Immature Granulocytes 0.08 (*)    All other components within normal limits  COMPREHENSIVE METABOLIC PANEL - Abnormal; Notable for the following components:   Glucose, Bld 103 (*)    AST 138 (*)    ALT 147 (*)    Alkaline Phosphatase 253 (*)    All other components within normal limits  URINE CULTURE    EKG None  Radiology MR THORACIC SPINE W WO CONTRAST  Result Date: 09/11/2022 EXAM: MRI THORACIC WITHOUT AND WITH CONTRAST TECHNIQUE: Multiplanar and multiecho pulse sequences of the thoracic spine were obtained without and with intravenous contrast. CONTRAST:  4m GADAVIST GADOBUTROL 1 MMOL/ML IV SOLN COMPARISON:  MRI of the thoracic spine May 13, 2022. FINDINGS: Alignment:  Lower thoracic kyphosis is unchanged. Vertebrae: Postsurgical changes from right laminectomy at T11.  Hemangiomas are seen at T7 and T10 vertebral bodies. No evidence of discitis or aggressive bone lesion. Cord: Mild mass effect on the cord at T8-9, T9-10, T10-11 and T11-12. No cord signal abnormality. Right-sided intradural/extramedullary enhancing lesion is no longer identified. Paraspinal and other soft tissues: Expected postsurgical changes in the posterior soft tissues at the T11 level with T1 and T2 hypointense small fluid collection measuring up to 1 cm in thickness causing mild mass effect on the thecal sac. Disc levels: T1-2:No spinal canal or neural foraminal stenosis. T2-3:No spinal canal or neural foraminal stenosis. T3-4:No spinal canal or neural foraminal stenosis. T4-5:No spinal canal or neural foraminal stenosis. T5-6:No spinal canal or neural foraminal stenosis. T6-7:No spinal canal or neural foraminal stenosis. T7-8:No spinal canal or neural foraminal stenosis. T8-9:Small left posterolateral disc protrusion causing indentation on the thecal sac and mild mass effect on the anterior cord surface without significant spinal canal or neural foraminal stenosis. T9-10:Small right posterolateral disc protrusion causing indentation on the thecal sac and mild mass effect on the anterior cord surface without significant spinal canal or neural foraminal stenosis. T10-11:Small central disc protrusion causing small indentation the thecal sac and on the anterior cord surface and resulting in mild spinal canal stenosis. No significant neural foraminal narrowing. T11-12:Postsurgical changes from right laminectomy with small fluid collection resulting in mild mass effect on the thecal sac and mild spinal canal stenosis. No significant neural foraminal narrowing. T12-L1:No spinal canal or neural foraminal stenosis. IMPRESSION: 1. Postsurgical changes from right laminectomy at T11 with small fluid collection causing mild mass effect on the thecal sac and mild spinal canal stenosis. No evidence of residual  intradural/extramedullary lesion. 2. Small disc protrusions at T8-9, T9-10 and T10-11 causing mild mass effect on the anterior cord surface without high-grade spinal canal or neural foraminal stenosis. Electronically Signed   By: KPedro EarlsM.D.   On: 09/11/2022 10:39   DG THORACOLUMABAR SPINE  Result Date: 09/10/2022 CLINICAL DATA:  Elective surgery. T11-T12 laminectomy. EXAM: THORACOLUMBAR SPINE 1V COMPARISON:  None Available. FINDINGS: Single frontal view of the lower thoracic and upper lumbar spine provided in the operating room. Surgical instruments project over the T11-T12 level assuming  the lower most ribs are present T12. Fluoroscopy time 4 seconds. Dose 2.41 mGy. IMPRESSION: Fluoroscopic spot view during lower thoracic surgery. Electronically Signed   By: Keith Rake M.D.   On: 09/10/2022 18:38   DG C-Arm 1-60 Min-No Report  Result Date: 09/10/2022 Fluoroscopy was utilized by the requesting physician.  No radiographic interpretation.   DG C-Arm 1-60 Min-No Report  Result Date: 09/10/2022 Fluoroscopy was utilized by the requesting physician.  No radiographic interpretation.    Procedures Procedures    Medications Ordered in ED Medications  HYDROmorphone (DILAUDID) injection 0.5 mg (0.5 mg Intramuscular Given 09/12/22 0635)  HYDROmorphone (DILAUDID) injection 1 mg (1 mg Intravenous Given 09/12/22 1352)  HYDROmorphone (DILAUDID) injection 1 mg (1 mg Intravenous Given 09/12/22 1545)    ED Course/ Medical Decision Making/ A&P Clinical Course as of 09/12/22 1550  Sat Sep 12, 2022  0704 Spoke with Dr. Reatha Armour of [facility] who does not believe additional imaging is needed unless there is evidence of true neurologic deficit. Agrees with plan for pain control. Their service will consider admission for pain control if unable to be improved in the ED. [KH]    Clinical Course User Index [KH] Antonietta Breach, PA-C                           Medical Decision Making Amount  and/or Complexity of Data Reviewed Labs: ordered. Radiology: ordered.  Risk Prescription drug management.    LAJEANA STROUGH is a 36 y.o. female with a past medical history significant for atrial flutter not on anticoagulation status post ablation, hypothyroidism, obesity status post gastric bypass surgery, previous cholecystectomy, asthma, depression, and recent lumbar laminectomy on 09/10/2022 (2 days ago) who presents with worsening back pain, bilateral leg numbness, bilateral leg weakness, and difficulty or with urination.  According to patient, she was discharged yesterday from the hospital and has been taking the medications prescribed.  She reports she was given oxycodone that has not seemed to help the pain.  She says that the pain feels different than it was when she was in the hospital and is now having sensation of bilateral leg numbness worse than baseline and bilateral leg weakness.  She reports she has only urinated once yesterday and did not urinate any today and is concerned about this.  She is denying any abdominal pain and also denies fevers, chills, nausea, or vomiting.  She reports she had a bowel movement yesterday and has not had a bowel movement today.  She says the pain is 10 out of 10 and wraps from her back down and around down her legs.  She has not had pain like this in the past.  On exam, lungs clear and chest nontender.  Back is tender to palpation diffusely in the lumbar spine and she does have a surgical dressing still in place.  She had sensation of both feet that felt different than her baseline and she was able to move both feet although she reports it is weaker than baseline for her.  She had nontender abdomen and had normal bowel sounds.  Lungs were clear bilaterally.  Patient's blood pressure is around 90 systolic but she is afebrile and not tachycardic here.  She is not hypoxic.  Clinically I am somewhat concerned patient could have some postoperative problem or  complication given the change in discomfort quality and the severity of the pain.  We will discuss with neurosurgery what if any imaging they would  prefer to rule out complications.  We will also get some screening labs and give her some pain medicine and fluids as she has very dry mucous membranes as well.  Anticipate reassessment after work-up.  1:18 PM Spoke with Dr. Reatha Armour with neurosurgery who recommends MRI T-spine without contrast to further evaluate.  This was ordered.  Anticipate discussion with him after imaging is completed to determine plan.  Care transferred, continue to await MRI results and reassessment.  Anticipate discussion with Dr. Reatha Armour with neurosurgery after MRI is completed to determine disposition plans.        Final Clinical Impression(s) / ED Diagnoses Final diagnoses:  Acute low back pain with bilateral sciatica, unspecified back pain laterality    Clinical Impression: 1. Acute low back pain with bilateral sciatica, unspecified back pain laterality     Disposition: Care transferred, continue to await MRI results and reassessment.  Anticipate discussion with Dr. Reatha Armour with neurosurgery after MRI is completed to determine disposition plans.  This note was prepared with assistance of Systems analyst. Occasional wrong-word or sound-a-like substitutions may have occurred due to the inherent limitations of voice recognition software.      Sharday Michl, Gwenyth Allegra, MD 09/12/22 1550

## 2022-09-12 NOTE — ED Triage Notes (Signed)
Patient arrived with EMS from home , recent Laminectomy surgery 09/10/22 , reports persistent low back pain radiating to right leg onset last night unrelieved by prescription medications .

## 2022-09-12 NOTE — ED Notes (Signed)
Pt s/p T11, T12 laminectomy on 11/9, presents from home with c/o increased back pain radiating down bilateral LE, worse on right. Pain constant, worse with activity. Denies fall/injury.

## 2022-09-12 NOTE — ED Notes (Signed)
Pt placed on purewick 

## 2022-09-12 NOTE — ED Notes (Signed)
To MRI at this time.

## 2022-09-12 NOTE — ED Provider Triage Note (Addendum)
Emergency Medicine Provider Triage Evaluation Note  Alejandra Barr , a 36 y.o. female  was evaluated in triage.  Pt complains of pain in her low back which is constant and became worse tonight. It wraps around her waistband like a belt and is worse with movement. She is unable to ambulate due to pain severity which has not been controlled with home Oxycodone. Also on daily Cymbalta and Gabapentin which she has remained compliant with. No genital or perianal numbness, new or changed lower extremity numbness, fevers.  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 Zada Finders.  Review of Systems  Positive: As above Negative: As above  Physical Exam  BP (!) 93/51   Pulse 97   Temp 98.9 F (37.2 C) (Oral)   Resp 18   LMP 09/03/2022 (Exact Date)   SpO2 99%  Gen:   Awake, no distress. Appears uncomfortable Resp:  Normal effort  MSK:   Difficult exam in triage and due to pain. Strength in BLE with knee extension, plantar and dorsiflexion against resistance 4-4+/5. Sensation decreased in the RLE, per patient which is her baseline. Other:  Postoperative incision site C/D/I  Medical Decision Making  Medically screening exam initiated at 6:38 AM.  Appropriate orders placed.  ARIELL GUNNELS was informed that the remainder of the evaluation will be completed by another provider, this initial triage assessment does not replace that evaluation, and the importance of remaining in the ED until their evaluation is complete.  Worsening back pain with hx of thoracic laminectomy 2 days ago. Difficult exam in triage. MRI thoracic spine yesterday prior to discharge as below.  IMPRESSION: 1. Postsurgical changes from right laminectomy at T11 with small fluid collection causing mild mass effect on the thecal sac and mild spinal canal stenosis. No evidence of residual intradural/extramedullary lesion.  Consult placed to [facility] to see if there is utility to repeat  imaging given postop fluid collection w/mass effect. Will focus on pain control in the interim. Dilaudid IM ordered pending room placement in the ED.   Antonietta Breach, PA-C 09/12/22 5003  7:03 AM Spoke with Dr. Reatha Armour of [facility] who does not believe additional imaging is needed unless there is evidence of true neurologic deficit. Agrees with plan for pain control. Their service will consider admission for pain control if unable to be improved in the ED.    Antonietta Breach, PA-C 09/12/22 7048

## 2022-09-12 NOTE — ED Notes (Signed)
Transported to CT 

## 2022-09-12 NOTE — ED Notes (Signed)
Patient transferred to MRI 

## 2022-09-13 DIAGNOSIS — M5416 Radiculopathy, lumbar region: Secondary | ICD-10-CM | POA: Diagnosis not present

## 2022-09-13 MED ORDER — OXYCODONE HCL 5 MG PO TABS
5.0000 mg | ORAL_TABLET | ORAL | Status: DC | PRN
  Administered 2022-09-13 – 2022-09-17 (×17): 5 mg via ORAL
  Filled 2022-09-13 (×17): qty 1

## 2022-09-13 MED ORDER — HEPARIN SODIUM (PORCINE) 5000 UNIT/ML IJ SOLN
5000.0000 [IU] | Freq: Three times a day (TID) | INTRAMUSCULAR | Status: DC
  Administered 2022-09-13 – 2022-09-17 (×12): 5000 [IU] via SUBCUTANEOUS
  Filled 2022-09-13 (×12): qty 1

## 2022-09-13 MED ORDER — DEXAMETHASONE SODIUM PHOSPHATE 4 MG/ML IJ SOLN
4.0000 mg | Freq: Four times a day (QID) | INTRAMUSCULAR | Status: DC
  Administered 2022-09-13 – 2022-09-17 (×16): 4 mg via INTRAVENOUS
  Filled 2022-09-13 (×18): qty 1

## 2022-09-13 NOTE — H&P (Signed)
Providing Compassionate, Quality Care - Together  NEUROSURGERY HISTORY & PHYSICAL   Alejandra Barr is an 36 y.o. female.   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 Zada Finders 09/10/2022.  She notes when she was at home she began having severe low back pain radiating into her bilateral hips and down her lower extremities.  She had episodes of falls and her legs "giving out".  At this time she denies any bowel or bladder changes, denies any groin numbness or saddle anesthesia.  She does continue to complain of sharp stabbing type pain in her bilateral low back that is quite severe.  It typically is aggravated with movement.  She denies any fevers.  Her incision is healing appropriately.  Past Medical History:  Diagnosis Date   Asthma    Atrial flutter ([place])    Status post RFA by Dr. 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    Iron deficiency    Iron deficiency anemia 10/02/2010   Qualifier: Diagnosis of  By: Moshe Cipro MD, Margaret     Low vitamin B12 level 07/10/2013   Overview:  Last Assessment & Plan:  Being treated through hematology   Newborn product of in vitro fertilization (IVF) pregnancy    Obesity    PONV (postoperative nausea and vomiting)     Past Surgical History:  Procedure Laterality Date   Bilateral eustachian tube placement on 4 different occasions     CARDIAC ELECTROPHYSIOLOGY [place]  2004   for aflutter/afib   CARPAL TUNNEL RELEASE Bilateral 05/2018   CESAREAN SECTION N/A 05/26/2017   Procedure: CESAREAN SECTION;  Surgeon:  Linda Hedges, DO;  Location: [facility];  Service: Obstetrics;  Laterality: N/A;   CESAREAN SECTION N/A 03/01/2021   Procedure: Repeat CESAREAN SECTION;  Surgeon: Brien Few, MD;  Location: [facility];  Service: Obstetrics;  Laterality: N/A;  EDD: 03/07/21   CESAREAN SECTION MULTI-GESTATIONAL N/A 02/10/2019   Procedure: Repeat CESAREAN SECTION MULTI-GESTATIONAL;  Surgeon: Brien Few, MD;  Location: [facility];  Service: Obstetrics;  Laterality: N/A;  EDD: 03/15/19 Allergy: Penicillin, Morphine, Cipro   CHOLECYSTECTOMY  2003   COLONOSCOPY N/A 11/29/2013   Procedure: COLONOSCOPY;  Surgeon: Rogene Houston, MD;  [facility];  Service: Endoscopy;  Laterality: N/A;  120-rescheduled to 300 Ann notified pt   GASTRIC BYPASS  2006   LAMINECTOMY N/A 09/10/2022   Procedure: 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;  Surgeon: Judith Part, MD;  Location: [facility];  Service: Neurosurgery;  Laterality: N/A;  RM 20 to follow   TONSILLECTOMY  1990    Family History  Problem Relation Age of Onset   Fibromyalgia Mother        Chronic pelvic pain    Hypothyroidism Mother    Obesity Mother    Heart defect Mother        Supraventricular, tachycardia /tachypalpation    Arrhythmia Mother    Breast cancer Mother 73   Hypertension Maternal Grandmother    Diabetes Maternal Grandmother  Hypertension Maternal Grandfather    Heart disease Paternal Grandmother    Hypertension Paternal Grandmother    Heart disease Paternal Grandfather    Hypertension Paternal Grandfather    Social History:  reports that she quit smoking about 7 years ago. Her smoking use included cigarettes. She started smoking about 9 years ago. She smoked an average of .5 packs per day. She has never used smokeless tobacco. She reports that she does not drink alcohol and does not use drugs.  Allergies:  Allergies  Allergen Reactions   Ciprofloxacin Anaphylaxis   Morphine And  Related Anaphylaxis    Pt states she can tolerate hydromorphone   Penicillins Anaphylaxis    Has patient had a PCN reaction causing immediate rash, facial/tongue/throat swelling, SOB or lightheadedness with hypotension: No Has patient had a PCN reaction causing severe rash involving mucus membranes or skin necrosis: No Has patient had a PCN reaction that required hospitalization: No Has patient had a PCN reaction occurring within the last 10 years: No If all of the above answers are "NO", then may proceed with Cephalosporin use.    (Not in a hospital admission)   Results for orders placed or performed during the hospital encounter of 09/12/22 (from the past 48 hour(s))  Urinalysis, Routine w reflex microscopic Urine, Clean Catch     Status: Abnormal   Collection Time: 09/12/22  1:20 PM  Result Value Ref Range   Color, Urine YELLOW YELLOW   APPearance CLOUDY (A) CLEAR   Specific Gravity, Urine 1.020 1.005 - 1.030   pH 6.0 5.0 - 8.0   Glucose, UA NEGATIVE NEGATIVE mg/dL   Hgb urine dipstick NEGATIVE NEGATIVE   Bilirubin Urine NEGATIVE NEGATIVE   Ketones, ur 5 (A) NEGATIVE mg/dL   Protein, ur NEGATIVE NEGATIVE mg/dL   Nitrite NEGATIVE NEGATIVE   Leukocytes,Ua NEGATIVE NEGATIVE    Comment: Performed at [facility] [address], [place], [place] [address]  CBC with Differential     Status: Abnormal   Collection Time: 09/12/22  1:30 PM  Result Value Ref Range   WBC 9.9 4.0 - 10.5 K/uL   RBC 4.29 3.87 - 5.11 MIL/uL   Hemoglobin 12.9 12.0 - 15.0 g/dL   HCT 39.9 36.0 - 46.0 %   MCV 93.0 80.0 - 100.0 fL   MCH 30.1 26.0 - 34.0 pg   MCHC 32.3 30.0 - 36.0 g/dL   RDW 13.1 11.5 - 15.5 %   Platelets 270 150 - 400 K/uL   nRBC 0.0 0.0 - 0.2 %   Neutrophils Relative % 74 %   Neutro Abs 7.4 1.7 - 7.7 K/uL   Lymphocytes Relative 15 %   Lymphs Abs 1.4 0.7 - 4.0 K/uL   Monocytes Relative 5 %   Monocytes Absolute 0.5 0.1 - 1.0 K/uL   Eosinophils Relative 4 %   Eosinophils Absolute  0.4 0.0 - 0.5 K/uL   Basophils Relative 1 %   Basophils Absolute 0.1 0.0 - 0.1 K/uL   Immature Granulocytes 1 %   Abs Immature Granulocytes 0.08 (H) 0.00 - 0.07 K/uL    Comment: Performed at [facility] [address] [place], [place] [address]  Comprehensive metabolic panel     Status: Abnormal   Collection Time: 09/12/22  1:30 PM  Result Value Ref Range   Sodium 139 135 - 145 mmol/L   Potassium 3.9 3.5 - 5.1 mmol/L   Chloride 103 98 - 111 mmol/L   CO2 24 22 -  32 mmol/L   Glucose, Bld 103 (H) 70 - 99 mg/dL    Comment: Glucose reference range applies only to samples taken after fasting for at least 8 hours.   BUN 12 6 - 20 mg/dL   Creatinine, Ser 0.65 0.44 - 1.00 mg/dL   Calcium 9.4 8.9 - 10.3 mg/dL   Total Protein 6.6 6.5 - 8.1 g/dL   Albumin 3.6 3.5 - 5.0 g/dL   AST 138 (H) 15 - 41 U/L   ALT 147 (H) 0 - 44 U/L   Alkaline Phosphatase 253 (H) 38 - 126 U/L   Total Bilirubin 0.4 0.3 - 1.2 mg/dL   GFR, Estimated >60 >60 mL/min    Comment: (NOTE) Calculated using the CKD-EPI Creatinine Equation (2021)    Anion gap 12 5 - 15    Comment: Performed at [facility] [address], [place], [place] [address]   MR LUMBAR SPINE WO CONTRAST  Result Date: 09/13/2022 CLINICAL DATA:  Lumbar radiculopathy EXAM: MRI LUMBAR SPINE WITHOUT CONTRAST TECHNIQUE: Multiplanar, multisequence MR imaging of the lumbar spine was performed. No intravenous contrast was administered. COMPARISON:  03/31/2022 FINDINGS: Segmentation:  Standard. Alignment:  Physiologic. Vertebrae:  No fracture, evidence of discitis, or bone lesion. Conus medullaris and cauda equina: Conus extends to the L1 level. Conus and cauda equina appear normal. Paraspinal and other soft tissues: Negative. Disc levels: L1-L2: Normal disc space and facet joints. No spinal canal stenosis. No neural foraminal stenosis. L2-L3: Normal disc space and facet joints. No spinal canal stenosis. No neural foraminal stenosis. L3-L4: Normal  disc space and facet joints. No spinal canal stenosis. No neural foraminal stenosis. L4-L5: Small left subarticular disc protrusion unchanged. Left lateral recess narrowing without central spinal canal stenosis. No neural foraminal stenosis. L5-S1: Small central disc protrusion. No spinal canal stenosis. No neural foraminal stenosis. Visualized sacrum: Normal. IMPRESSION: 1. Unchanged small left subarticular disc protrusion at L4-L5 narrowing the left lateral recess. This is a potential source of left L5 radiculopathy. 2. Small central disc protrusion at L5-S1 without associated stenosis. Electronically Signed   By: Ulyses Jarred M.D.   On: 09/13/2022 01:20   MR THORACIC SPINE WO CONTRAST  Result Date: 09/12/2022 CLINICAL DATA:  Mid-back pain Surgery 2 days ago, worsened pain with subjective numbness and weakness in the legs and decreased urine and bowel movements EXAM: MRI THORACIC SPINE WITHOUT CONTRAST TECHNIQUE: Multiplanar, multisequence MR imaging of the thoracic spine was performed. No intravenous contrast was administered. COMPARISON:  None Available. FINDINGS: Motion limited study.  Within this limitation: Alignment: Lower thoracic kyphosis, unchanged. No new sagittal subluxation. Vertebrae: Postsurgical changes of right laminectomy at T11. 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 Motion limited STIR sequence without specific evidence of acute fracture or discitis/osteomyelitis. Cord: Similar mild mass effect on the cord at multiple levels due to small disc protrusions, detailed on recent prior. The previously seen intradural/extramedullary enhancing lesion is not visible but the absence of contrast on this study limits assessment. Paraspinal and other soft tissues: Postoperative changes in the posterior spinal soft tissues at T11 with similar fluid collection. Disc levels: In comparison to MRI from yesterday, no substantial change in postsurgical changes of right laminectomy at T11 with  small fluid collection causing mild to moderate mass effect on the thecal sac and mild to moderate spinal canal stenosis. The absence of contrast precludes evaluation for enhancing lesion. Also, similar multilevel degenerative change in the thoracic spine which was detailed on the prior. IMPRESSION: 1.  In comparison to MRI from yesterday, no substantial change in postsurgical changes of right laminectomy at T11 with small fluid collection causing mild mass effect on the thecal sac and mild to moderate spinal canal stenosis. 2. Similar mild multilevel degenerative change which was detailed on the prior. 3. Motion limited study. Electronically Signed   By: Margaretha Sheffield M.D.   On: 09/12/2022 17:14    ROS All pertinent positive negatives are listed in HPI above  Blood pressure 129/77, pulse 94, temperature 98.2 F (36.8 C), temperature source Oral, resp. rate 13, height '5\' 1"'$  (1.549 m), weight 94.8 kg, last menstrual period 09/03/2022, SpO2 100 %. Physical Exam  Awake alert Orient x3, mild distress due to pain PERRLA Cranial nerves II through XII intact Bilateral upper extremities full strength Incision clean Dry and intact Bilateral lower extremities are 4-4+/5 throughout Sensation light touch is intact in the lower extremities  Assessment/Plan 36 year old female with  Acute on chronic low back pain with radiating radiculopathy   -MRI thoracic and lumbar spine obtained.  There is no acute change of her lumbar spine with no high-grade stenosis.  MRI thoracic spine shows postoperative changes at T11 without hematoma or compressive pathology.  I do believe she needs to be admitted in order for pain control and PT/OT evaluation.  Thank you for allowing me to participate in this patient's care.  Please do not hesitate to call with questions or concerns.   Elwin Sleight, [facility] Cell: [phone]

## 2022-09-14 ENCOUNTER — Encounter (HOSPITAL_COMMUNITY): Payer: Self-pay | Admitting: Neurological Surgery

## 2022-09-14 DIAGNOSIS — M4804 Spinal stenosis, thoracic region: Secondary | ICD-10-CM | POA: Diagnosis present

## 2022-09-14 DIAGNOSIS — J45909 Unspecified asthma, uncomplicated: Secondary | ICD-10-CM | POA: Diagnosis present

## 2022-09-14 DIAGNOSIS — Z9884 Bariatric surgery status: Secondary | ICD-10-CM | POA: Diagnosis not present

## 2022-09-14 DIAGNOSIS — Z87891 Personal history of nicotine dependence: Secondary | ICD-10-CM | POA: Diagnosis not present

## 2022-09-14 DIAGNOSIS — Z8249 Family history of ischemic heart disease and other diseases of the circulatory system: Secondary | ICD-10-CM | POA: Diagnosis not present

## 2022-09-14 DIAGNOSIS — M5441 Lumbago with sciatica, right side: Secondary | ICD-10-CM | POA: Diagnosis present

## 2022-09-14 DIAGNOSIS — Z79899 Other long term (current) drug therapy: Secondary | ICD-10-CM | POA: Diagnosis not present

## 2022-09-14 DIAGNOSIS — I4891 Unspecified atrial fibrillation: Secondary | ICD-10-CM | POA: Diagnosis present

## 2022-09-14 DIAGNOSIS — M5416 Radiculopathy, lumbar region: Secondary | ICD-10-CM | POA: Diagnosis present

## 2022-09-14 DIAGNOSIS — G8929 Other chronic pain: Secondary | ICD-10-CM | POA: Diagnosis present

## 2022-09-14 DIAGNOSIS — M5442 Lumbago with sciatica, left side: Secondary | ICD-10-CM | POA: Diagnosis present

## 2022-09-14 DIAGNOSIS — E669 Obesity, unspecified: Secondary | ICD-10-CM | POA: Diagnosis present

## 2022-09-14 DIAGNOSIS — Z880 Allergy status to penicillin: Secondary | ICD-10-CM | POA: Diagnosis not present

## 2022-09-14 DIAGNOSIS — F32A Depression, unspecified: Secondary | ICD-10-CM | POA: Diagnosis present

## 2022-09-14 DIAGNOSIS — R531 Weakness: Secondary | ICD-10-CM | POA: Diagnosis present

## 2022-09-14 DIAGNOSIS — Z881 Allergy status to other antibiotic agents status: Secondary | ICD-10-CM | POA: Diagnosis not present

## 2022-09-14 DIAGNOSIS — I4892 Unspecified atrial flutter: Secondary | ICD-10-CM | POA: Diagnosis present

## 2022-09-14 DIAGNOSIS — Z885 Allergy status to narcotic agent status: Secondary | ICD-10-CM | POA: Diagnosis not present

## 2022-09-14 DIAGNOSIS — E039 Hypothyroidism, unspecified: Secondary | ICD-10-CM | POA: Diagnosis present

## 2022-09-14 DIAGNOSIS — Z6839 Body mass index (BMI) 39.0-39.9, adult: Secondary | ICD-10-CM | POA: Diagnosis not present

## 2022-09-14 LAB — URINE CULTURE: Culture: <10000 — AB

## 2022-09-14 LAB — HIV ANTIBODY (ROUTINE TESTING W REFLEX): HIV Screen 4th Generation wRfx: NONREACTIVE

## 2022-09-14 NOTE — Evaluation (Signed)
Occupational Therapy Evaluation Patient Details Name: Alejandra Barr MRN: [id number] DOB: 1985-11-19 Today's Date: 09/14/2022   History of Present Illness Patient is a 36 y/o female who presents from home on 09/12/22 with worsening back pain radiating to BLEs (RLE>LLE) and weakness. Found to have small fluid collection causing mild mass effect on thecal sac and mild spinal stenosis. Pt with recent thoracic tumor resection and T11 Laminectomy on 11/9. PMH includes chronic anxiety, chronic depression, DM, asthma.   Clinical Impression   Prior to back surgery, pt was able to ambulate and was functioning modified independently in self care, but spent much of her time in bed due to back pain. She lives with her husband and young children. Pt presents with severe back pain, but chose to push through the pain (post dilaudid) to stand with RW and +2 min assist. She was able to side step along EOB. Pt assisted back to bed, crying with increase in pain. Pt is unable to sit without B UE support at EOB. She needs set up at bed level to total assist for ADLs. Pt likely to progress well once pain is managed. Will follow acutely.      Recommendations for follow up therapy are one component of a multi-disciplinary discharge planning process, led by the attending physician.  Recommendations may be updated based on patient status, additional functional criteria and insurance authorization.   Follow Up Recommendations  Home health OT     Assistance Recommended at Discharge Intermittent Supervision/Assistance  Patient can return home with the following A lot of help with bathing/dressing/bathroom;Assistance with cooking/housework;Assist for transportation;Help with stairs or ramp for entrance;A lot of help with walking and/or transfers    Functional Status Assessment  Patient has had a recent decline in their functional status and demonstrates the ability to make significant improvements in function in a  reasonable and predictable amount of time.  Equipment Recommendations  None recommended by OT    Recommendations for Other Services       Precautions / Restrictions Precautions Precautions: Back;Fall Precaution Comments: reviewed back precautions Restrictions Weight Bearing Restrictions: No      Mobility Bed Mobility Overal bed mobility: Needs Assistance Bed Mobility: Sidelying to Sit, Sit to Sidelying   Sidelying to sit: Min guard     Sit to sidelying: Mod assist General bed mobility comments: increased time, assist for LEs back into bed    Transfers Overall transfer level: Needs assistance Equipment used: Rolling walker (2 wheels) Transfers: Sit to/from Stand Sit to Stand: +2 physical assistance, Min assist, From elevated surface           General transfer comment: increased time, assist to rise and steady with RW stabilized      Balance Overall balance assessment: Needs assistance Sitting-balance support: Bilateral upper extremity supported Sitting balance-Leahy Scale: Fair Sitting balance - Comments: required B UE support at all times   Standing balance support: Bilateral upper extremity supported, Reliant on assistive device for balance Standing balance-Leahy Scale: Poor Standing balance comment: reliant on UE support of walker and min assist                           ADL either performed or assessed with clinical judgement   ADL Overall ADL's : Needs assistance/impaired Eating/Feeding: Set up;Bed level   Grooming: Total assistance;Sitting Grooming Details (indicate cue type and reason): to put hair up Upper Body Bathing: Total assistance;Sitting   Lower Body Bathing: Total assistance;Sit  to/from stand;+2 for physical assistance   Upper Body Dressing : Total assistance;Sitting Upper Body Dressing Details (indicate cue type and reason): changed gown Lower Body Dressing: Total assistance;Bed level   Toilet Transfer: +2 for physical  assistance;Minimal assistance   Toileting- Clothing Manipulation and Hygiene: Total assistance;Sit to/from stand;+2 for safety/equipment       Functional mobility during ADLs: Minimal assistance;Rolling walker (2 wheels);+2 for safety/equipment (took side steps along EOB) General ADL Comments: pt unable to sit without B UE support due to pain     Vision Baseline Vision/History: 0 No visual deficits       Perception     Praxis      Pertinent Vitals/Pain Pain Assessment Pain Assessment: Faces Faces Pain Scale: Hurts worst Pain Location: back Pain Descriptors / Indicators: Grimacing, Guarding, Crying Pain Intervention(s): Monitored during session, Premedicated before session, Repositioned, Ice applied     Hand Dominance Right   Extremity/Trunk Assessment Upper Extremity Assessment Upper Extremity Assessment: Overall WFL for tasks assessed   Lower Extremity Assessment Lower Extremity Assessment: Defer to PT evaluation   Cervical / Trunk Assessment Cervical / Trunk Assessment: Back Surgery   Communication Communication Communication: No difficulties   Cognition Arousal/Alertness: Awake/alert Behavior During Therapy: WFL for tasks assessed/performed Overall Cognitive Status: Within Functional Limits for tasks assessed                                       General Comments       Exercises     Shoulder Instructions      Home Living Family/patient expects to be discharged to:: Private residence Living Arrangements: Spouse/significant other;Children Available Help at Discharge: Family;Available 24 hours/day Type of Home: House Home Access: Ramped entrance     Home Layout: Two level;Able to live on main level with bedroom/bathroom;Full bath on main level     Bathroom Shower/Tub: Occupational psychologist: Standard     Home Equipment: None (has access to [facility], RW, 4WW if needed)   Additional Comments: has 4 small children - 25yo, twin  3 yr and 19 mo daughter. spouse is stay at home dad so can give help. they have 29 yo dog that requires (A) and spouse can manage      Prior Functioning/Environment Prior Level of Function : Independent/Modified Independent;Working/employed;Driving             Mobility Comments: reports being in bed alot in the last couple months due to back pain. ADLs Comments: pt reports working from home in the bed level due to pain        OT Problem List: Impaired balance (sitting and/or standing);Pain;Decreased knowledge of use of DME or AE      OT Treatment/Interventions: Self-care/ADL training;Therapeutic exercise;Neuromuscular education;DME and/or AE instruction;Therapeutic activities;Patient/family education;Balance training    OT Goals(Current goals can be found in the care plan section) Acute Rehab OT Goals OT Goal Formulation: With patient Time For Goal Achievement: 09/28/22 Potential to Achieve Goals: Good ADL Goals Pt Will Perform Grooming: with supervision;standing Pt Will Perform Lower Body Bathing: with supervision;sit to/from stand;with adaptive equipment Pt Will Perform Lower Body Dressing: with supervision;sit to/from stand;with adaptive equipment Pt Will Transfer to Toilet: with supervision;ambulating Pt Will Perform Toileting - Clothing Manipulation and hygiene: with supervision;sit to/from stand;with adaptive equipment Additional ADL Goal #1: Pt will perform bed mobility modified independently in preparation for ADLs.  OT Frequency: Min 2X/week  Co-evaluation PT/OT/SLP Co-Evaluation/Treatment: Yes Reason for Co-Treatment: For patient/therapist safety   OT goals addressed during session: ADL's and self-care;Strengthening/ROM      AM-PAC OT "6 Clicks" Daily Activity     Outcome Measure Help from another person eating meals?: None Help from another person taking care of personal grooming?: Total Help from another person toileting, which includes using toliet,  bedpan, or urinal?: Total Help from another person bathing (including washing, rinsing, drying)?: Total Help from another person to put on and taking off regular upper body clothing?: Total Help from another person to put on and taking off regular lower body clothing?: Total 6 Click Score: 9   End of Session Equipment Utilized During Treatment: Rolling walker (2 wheels) Nurse Communication: Mobility status  Activity Tolerance: Patient limited by pain Patient left: in bed;with call bell/phone within reach;with family/visitor present  OT Visit Diagnosis: Unsteadiness on feet (R26.81);Other abnormalities of gait and mobility (R26.89);Pain                Time: 5361-4431 OT Time Calculation (min): 22 min Charges:  OT General Charges $OT Visit: 1 Visit OT Evaluation $OT Eval Moderate Complexity: [place], OTR/L Acute Rehabilitation Services Office: [phone]   Malka So 09/14/2022, 9:40 AM

## 2022-09-14 NOTE — Progress Notes (Signed)
   Providing Compassionate, Quality Care - Together  NEUROSURGERY PROGRESS NOTE   S: No issues overnight. Feels like steroids have helped pain  O: EXAM:  BP (!) 88/54   Pulse 89   Temp 98.9 F (37.2 C) (Oral)   Resp 20   Ht '5\' 1"'$  (1.549 m)   Wt 94.8 kg   LMP 09/03/2022 (Exact Date)   SpO2 96%   BMI 39.49 kg/m   Awake alert [place] x3, mild distress due to pain PERRLA Cranial nerves II through XII intact Bilateral upper extremities full strength Incision clean Dry and intact Bilateral lower extremities are 4-4+/5 throughout Sensation light touch is intact in the lower extremities   Assessment/Plan 36 year old female with   Acute on chronic low back pain with radiating radiculopathy  -cont pain control, pt/ot to come by today -steroids -dvt ppx   Thank you for allowing me to participate in this patient's care.  Please do not hesitate to call with questions or concerns.   Elwin Sleight, [facility] Cell: [phone]

## 2022-09-14 NOTE — Evaluation (Signed)
Physical Therapy Evaluation Patient Details Name: Alejandra Barr MRN: [id number] DOB: January 25, 1986 Today's Date: 09/14/2022  History of Present Illness  Patient is a 36 y/o female who presents from home on 09/12/22 with worsening back pain radiating to BLEs (RLE>LLE) and weakness. Found to have small fluid collection causing mild mass effect on thecal sac and mild spinal stenosis. Pt with recent thoracic tumor resection and T11 Laminectomy on 11/9. PMH includes chronic anxiety, chronic depression, DM, asthma.  Clinical Impression  Patient presents with pain, decreased activity tolerance and impaired mobility s/p above. Pt recently d/ced home on 11/10 for above back surgery and unable to get OOB or mobilize once home due to worsening radiating pain down BLEs. Normally pt is a very busy individual taking care of 4 children and working PTA. Today, pt requires Min guard -Mod A for bed mobility, Min A of 2 for standing and able to side step along side bed with Min A and RW. Pt limited by pain but very motivated.  Likely will progress well once pain is more under control. Will follow acutely to maximize independence and mobility prior to return home.     Recommendations for follow up therapy are one component of a multi-disciplinary discharge planning process, led by the attending physician.  Recommendations may be updated based on patient status, additional functional criteria and insurance authorization.  Follow Up Recommendations No PT follow up (pending pain improvement)      Assistance Recommended at Discharge Frequent or constant Supervision/Assistance  Patient can return home with the following  A little help with walking and/or transfers;A little help with bathing/dressing/bathroom;Assistance with cooking/housework;Assist for transportation;Help with stairs or ramp for entrance    Equipment Recommendations None recommended by PT  Recommendations for Other Services       Functional Status  Assessment Patient has had a recent decline in their functional status and demonstrates the ability to make significant improvements in function in a reasonable and predictable amount of time.     Precautions / Restrictions Precautions Precautions: Back;Fall Precaution Booklet Issued: Yes (comment) Precaution Comments: reviewed back precautions Restrictions Weight Bearing Restrictions: No      Mobility  Bed Mobility Overal bed mobility: Needs Assistance Bed Mobility: Sidelying to Sit, Sit to Sidelying   Sidelying to sit: Min guard     Sit to sidelying: Mod assist General bed mobility comments: increased time, assist for LEs back into bed.    Transfers Overall transfer level: Needs assistance Equipment used: Rolling walker (2 wheels) Transfers: Sit to/from Stand Sit to Stand: +2 physical assistance, Min assist, From elevated surface           General transfer comment: increased time, assist to rise and steady with RW stabilized, cues to breathe. Difficulty getting dfully upright    Ambulation/Gait Ambulation/Gait assistance: Min assist, +2 safety/equipment Gait Distance (Feet): 3 Feet Assistive device: Rolling walker (2 wheels)   Gait velocity: decreased     General Gait Details: Able to side step along side bed with assist for RW management and increased time, pain limiting.  Stairs            Wheelchair Mobility    Modified Rankin (Stroke Patients Only)       Balance Overall balance assessment: Needs assistance Sitting-balance support: Feet supported, Bilateral upper extremity supported Sitting balance-Leahy Scale: Fair Sitting balance - Comments: required B UE support at all times due to pain   Standing balance support: Bilateral upper extremity supported, Reliant on assistive device for  balance Standing balance-Leahy Scale: Poor Standing balance comment: reliant on UE support of walker and min assist                              Pertinent Vitals/Pain Pain Assessment Pain Assessment: Faces Faces Pain Scale: Hurts worst Pain Location: back, radiating down BLEs Pain Descriptors / Indicators: Grimacing, Guarding, Crying, Sore Pain Intervention(s): Monitored during session, Repositioned, Limited activity within patient's tolerance, Premedicated before session    Home Living Family/patient expects to be discharged to:: Private residence Living Arrangements: Spouse/significant other;Children Available Help at Discharge: Family;Available 24 hours/day Type of Home: House Home Access: Ramped entrance       Home Layout: Two level;Able to live on main level with bedroom/bathroom;Full bath on main level Home Equipment: None Additional Comments: has 4 small children - 57yo, twin 3 yr and 41 mo daughter. spouse is stay at home dad so can give help. they have 12 yo dog that requires (A) and spouse can manage    Prior Function Prior Level of Function : Independent/Modified Independent;Working/employed;Driving             Mobility Comments: reports being in bed alot in the last couple months due to back pain. Not able to get up once d/ced from hospital due to pain ADLs Comments: pt reports working from home in the bed level due to pain     Hand Dominance   Dominant Hand: Right    Extremity/Trunk Assessment   Upper Extremity Assessment Upper Extremity Assessment: Defer to OT evaluation    Lower Extremity Assessment Lower Extremity Assessment: Generalized weakness RLE Deficits / Details: Decreased sensation RLE into foot, radiating symptoms posterior leg LLE Deficits / Details: Numbness in foot    Cervical / Trunk Assessment Cervical / Trunk Assessment: Back Surgery  Communication   Communication: No difficulties  Cognition Arousal/Alertness: Awake/alert Behavior During Therapy: WFL for tasks assessed/performed Overall Cognitive Status: Within Functional Limits for tasks assessed                                           General Comments General comments (skin integrity, edema, etc.): Mother present during session.    Exercises     Assessment/Plan    PT Assessment Patient needs continued PT services  PT Problem List Decreased strength;Decreased balance;Decreased activity tolerance;Decreased mobility;Decreased knowledge of precautions;Impaired sensation;Decreased skin integrity;Pain       PT Treatment Interventions DME instruction;Gait training;Therapeutic activities;Therapeutic exercise;Functional mobility training;Balance training;Patient/family education    PT Goals (Current goals can be found in the Care Plan section)  Acute Rehab PT Goals Patient Stated Goal: decrease pain, get back to life PT Goal Formulation: With patient Time For Goal Achievement: 09/28/22 Potential to Achieve Goals: Good    Frequency Min 4X/week     Co-evaluation PT/OT/SLP Co-Evaluation/Treatment: Yes Reason for Co-Treatment: For patient/therapist safety PT goals addressed during session: Mobility/safety with mobility OT goals addressed during session: ADL's and self-care;Strengthening/ROM       AM-PAC PT "6 Clicks" Mobility  Outcome Measure Help needed turning from your back to your side while in a flat bed without using bedrails?: A Little Help needed moving from lying on your back to sitting on the side of a flat bed without using bedrails?: A Little Help needed moving to and from a bed to a chair (including a wheelchair)?:  A Little Help needed standing up from a chair using your arms (e.g., wheelchair or bedside chair)?: A Little Help needed to walk in hospital room?: Total Help needed climbing 3-5 steps with a railing? : A Lot 6 Click Score: 15    End of Session   Activity Tolerance: Patient limited by pain Patient left: in bed;with call bell/phone within reach;with family/visitor present;with nursing/sitter in room Nurse Communication: Mobility status PT Visit  Diagnosis: Muscle weakness (generalized) (M62.81);Other symptoms and signs involving the nervous system (R29.898);Pain;Difficulty in walking, not elsewhere classified (R26.2) Pain - part of body:  (back)    Time: 5277-8242 PT Time Calculation (min) (ACUTE ONLY): 22 min   Charges:   PT Evaluation $PT Eval Moderate Complexity: 1 Mod          Marisa Severin, PT, DPT Acute Rehabilitation Services Secure chat preferred Office [phone]     Marguarite Arbour A [place] 09/14/2022, 9:50 AM

## 2022-09-15 LAB — SURGICAL PATHOLOGY

## 2022-09-15 NOTE — Progress Notes (Signed)
Mobility Specialist Progress Note   09/15/22 1354  Mobility  Activity Transferred from chair to bed  Level of Assistance Minimal assist, patient does 75% or more  Assistive Device Front wheel walker  Distance Ambulated (ft) 4 ft  Activity Response Tolerated well  $Mobility charge 1 Mobility   Pt requesting assistance to get from chair to bed d/t increase pain. Required MinA for power up in chair but minG for the remainder of transfer. Pt requiring increased time throughout d/t pain tolerance but able to demonstrate good back precautions during mobility. Pt left in bed w/o incident and call bell in reach.   Holland Falling Mobility Specialist Acute Rehab Office:  [phone]

## 2022-09-15 NOTE — Progress Notes (Signed)
Physical Therapy Treatment Patient Details Name: Alejandra Barr MRN: [id number] DOB: Aug 16, 1986 Today's Date: 09/15/2022   History of Present Illness Patient is a 36 y/o female who presents from home on 09/12/22 with worsening back pain radiating to BLEs (RLE>LLE) and weakness. Found to have small fluid collection causing mild mass effect on thecal sac and mild spinal stenosis. Pt with recent thoracic tumor resection and T11 Laminectomy on 11/9. PMH includes chronic anxiety, chronic depression, DM, asthma.    PT Comments    Pt received in sidelying, pillow bwn knees, mother present; pt agreeable to therapy session although unable to be premedicated for pain due to IV malfunction per RN and awaiting new IV placement from IV team. Pt agreeable to attempt transfers/gait and making good progress toward goals this session, pt needing up to min guard for all functional mobility tasks with increased time given. Pt progressed gait distance to ~29f with RW. Cues for pursed-lip breathing needed; Ice given for her to place on her back while awaiting new IV. Pt continues to benefit from PT services to progress toward functional mobility goals.    Recommendations for follow up therapy are one component of a multi-disciplinary discharge planning process, led by the attending physician.  Recommendations may be updated based on patient status, additional functional criteria and insurance authorization.  Follow Up Recommendations  No PT follow up (pending pain improvement)     Assistance Recommended at Discharge Frequent or constant Supervision/Assistance  Patient can return home with the following A little help with walking and/or transfers;A little help with bathing/dressing/bathroom;Assistance with cooking/housework;Assist for transportation;Help with stairs or ramp for entrance   Equipment Recommendations  None recommended by PT    Recommendations for Other Services       Precautions / Restrictions  Precautions Precautions: Back;Fall Precaution Comments: reviewed back precautions, pt able to state them back Restrictions Weight Bearing Restrictions: No     Mobility  Bed Mobility Overal bed mobility: Needs Assistance Bed Mobility: Rolling Rolling: Modified independent (Device/Increase time) Sidelying to sit: Min guard     Sit to sidelying: Min assist, +2 for physical assistance General bed mobility comments: Increased time, from flat bed not using rails to sit up, upon return to supine pt given BLE assist and light trunk guarding for safety    Transfers Overall transfer level: Needs assistance Equipment used: Rolling walker (2 wheels) Transfers: Sit to/from Stand, Bed to chair/wheelchair/BSC Sit to Stand: Min guard           General transfer comment: Increased time, vc's to breathe and steady, pt pushing with LUE and maintains RUE on RW for stability, no LOB    Ambulation/Gait Ambulation/Gait assistance: Min assist, +2 safety/equipment Gait Distance (Feet): 70 Feet Assistive device: Rolling walker (2 wheels) Gait Pattern/deviations: Step-through pattern, Decreased stride length, Antalgic       General Gait Details: cues for pursed-lip breathing, chair follow for safety but pt did not need seated break; pain significant throughout but worsening toward end of trial      Balance Overall balance assessment: Needs assistance Sitting-balance support: Feet supported, Bilateral upper extremity supported Sitting balance-Leahy Scale: Fair Sitting balance - Comments: required B UE support at all times due to pain   Standing balance support: Bilateral upper extremity supported, Reliant on assistive device for balance Standing balance-Leahy Scale: Fair Standing balance comment: reliant on UE support of walker, min guard for safety  Cognition Arousal/Alertness: Awake/alert Behavior During Therapy: WFL for tasks  assessed/performed Overall Cognitive Status: Within Functional Limits for tasks assessed                                          Exercises Other Exercises Other Exercises: seated BLE AROM: LAQ, ankle pumps x5 reps    General Comments General comments (skin integrity, edema, etc.): Mother present and able to assist with pushing chair behind her for safety, instructed on gait belt positioning.      Pertinent Vitals/Pain Pain Assessment Pain Assessment: 0-10 Pain Score: 7  Pain Location: back, radiating down BLEs, worse on her R side Pain Descriptors / Indicators: Grimacing, Guarding, Sore, Radiating, Sharp Pain Intervention(s): Monitored during session, Repositioned, Patient requesting pain meds-RN notified, Ice applied, Other (comment) (per RN, IV not working and IV team consulted to place a new IV)           PT Goals (current goals can now be found in the care plan section) Acute Rehab PT Goals Patient Stated Goal: decrease pain, get back to life, be able to bend down to play with my kids. PT Goal Formulation: With patient Time For Goal Achievement: 09/28/22 Progress towards PT goals: Progressing toward goals    Frequency    Min 4X/week      PT Plan Current plan remains appropriate       AM-PAC PT "6 Clicks" Mobility   Outcome Measure  Help needed turning from your back to your side while in a flat bed without using bedrails?: None Help needed moving from lying on your back to sitting on the side of a flat bed without using bedrails?: A Little Help needed moving to and from a bed to a chair (including a wheelchair)?: A Little Help needed standing up from a chair using your arms (e.g., wheelchair or bedside chair)?: A Little Help needed to walk in hospital room?: A Little Help needed climbing 3-5 steps with a railing? : A Lot 6 Click Score: 18    End of Session Equipment Utilized During Treatment: Gait belt Activity Tolerance: Patient tolerated  treatment well;Patient limited by pain Patient left: in bed;with call bell/phone within reach;with family/visitor present (s/l in bed with pillow between her knees, ice to back incisional area) Nurse Communication: Mobility status;Patient requests pain meds PT Visit Diagnosis: Muscle weakness (generalized) (M62.81);Other symptoms and signs involving the nervous system (R29.898);Pain;Difficulty in walking, not elsewhere classified (R26.2) Pain - part of body:  (Back, RLE)     Time: 6568-1275 PT Time Calculation (min) (ACUTE ONLY): 25 min  Charges:  $Gait Training: 8-22 mins $Therapeutic Activity: 8-22 mins                     Ruthie Berch P., PTA Acute Rehabilitation Services Secure Chat Preferred 9a-5:30pm Office: [place] 09/15/2022, 6:19 PM

## 2022-09-15 NOTE — Progress Notes (Signed)
   Providing Compassionate, Quality Care - Together  NEUROSURGERY PROGRESS NOTE   S: No issues overnight.  Was able to get to the chair today.  Feels as though her pain and mobility is improving  O: EXAM:  BP 115/67 (BP Location: Right Wrist)   Pulse 82   Temp 98.2 F (36.8 C) (Oral)   Resp 14   Ht '5\' 1"'$  (1.549 m)   Wt 94.8 kg   LMP 09/03/2022 (Exact Date)   SpO2 97%   BMI 39.49 kg/m   Awake, alert, oriented x3 PERRL Speech fluent, appropriate  CNs grossly intact  5/5 BUE/BLE  Wound clean dry and intact  ASSESSMENT:  36 y.o. female with   Acute on chronic low back pain with radiating radiculopathy T11 schwannoma, status post resection  PLAN: -Continue mobility, continue steroids, continue pain control -DC possibly tomorrow if she is able to ambulate further in the hallway    Thank you for allowing me to participate in this patient's care.  Please do not hesitate to call with questions or concerns.   Elwin Sleight, [facility] Cell: [phone]

## 2022-09-15 NOTE — Progress Notes (Signed)
Occupational Therapy Treatment Patient Details Name: Alejandra Barr MRN: [id number] DOB: [birth year] Today's Date: 09/15/2022   History of present illness Patient is a 36 y/o female who presents from home on 09/12/22 with worsening back pain radiating to BLEs (RLE>LLE) and weakness. Found to have small fluid collection causing mild mass effect on thecal sac and mild spinal stenosis. Pt with recent thoracic tumor resection and T11 Laminectomy on 11/9. PMH includes chronic anxiety, chronic depression, DM, asthma.   OT comments  Pt was seen for OT with focus on ADL retraining/functional mobility/transfers. She performed bed mobility Mod I with increased time while adhering to back precautions and performing sidlying to sit. She was able to sit EOB with decreased pain per her report, "as compared to yesterday". Pt was Min A +1 sit to stand from EOB with vc's for hand placement and RW use. She then ambulated to chair and completed transfer with Min A +1 overall. Pt was assisted with meal tray placement and was sitting up to eat lunch at conclusion of therapy session. Pt/RN were educated to call mobility specialist for assist back to bed after lunch, with goal to sit up ~30-74mn as tolerated.   Recommendations for follow up therapy are one component of a multi-disciplinary discharge planning process, led by the attending physician.  Recommendations may be updated based on patient status, additional functional criteria and insurance authorization.    Follow Up Recommendations  Home health OT     Assistance Recommended at Discharge Intermittent Supervision/Assistance  Patient can return home with the following  A lot of help with bathing/dressing/bathroom;Assistance with cooking/housework;Assist for transportation;Help with stairs or ramp for entrance;A lot of help with walking and/or transfers   Equipment Recommendations  None recommended by OT    Recommendations for Other Services       Precautions / Restrictions Precautions Precautions: Back;Fall Precaution Comments: reviewed back precautions Restrictions Weight Bearing Restrictions: No       Mobility Bed Mobility Overal bed mobility: Needs Assistance Bed Mobility: Sidelying to Sit   Sidelying to sit: Modified independent (Device/Increase time)       General bed mobility comments: Increased time    Transfers Overall transfer level: Needs assistance Equipment used: Rolling walker (2 wheels) Transfers: Sit to/from Stand, Bed to chair/wheelchair/BSC Sit to Stand: Min assist     General transfer comment: Increased time, vc's to breathe and steady but no hands on assist required given increased time and moving at pt own pace. Pt ambulated in room to chair and was sitting up in chair for lunch, family in room     Balance Overall balance assessment: Needs assistance Sitting-balance support: Feet supported, Bilateral upper extremity supported Sitting balance-Leahy Scale: Fair     Standing balance support: Bilateral upper extremity supported, Reliant on assistive device for balance Standing balance-Leahy Scale: Fair Standing balance comment: reliant on UE support of walker and min assist       ADL either performed or assessed with clinical judgement   ADL Overall ADL's : Needs assistance/impaired Eating/Feeding: Set up;Sitting Eating/Feeding Details (indicate cue type and reason): Sitting up in chair Grooming: Minimal assistance;Sitting   Toilet Transfer: Minimal assistance;Rolling walker (2 wheels);BSC/3in1 Toilet Transfer Details (indicate cue type and reason): Simulated toilet transfer from EOB and ambulation to chair ~8 feet away in room, +1 assist, increased time Toileting- Clothing Manipulation and Hygiene: Maximal assistance;Total assistance;Sit to/from stand   Functional mobility during ADLs: Minimal assistance;Rolling walker (2 wheels) General ADL Comments: Pt was premedicated per her  request,  she performed bed mobility Mod I with increased time while adhering to back precautions and performing sidlying to sit, She was able to sit EOB with decreased pain per her report, "as compared to yesterday". Pt was Min A +1 sit to stand from EOB with vc's for hand placement and RW use. She then ambulated to chair and completed transfer with Min A +1 overall. Pt was assisted with meal tray placement and was sitting up to eat lunch at conclusion of therapy session. Pt/RN were educated to cal for assist back to bed after lunch, with goal to sit up ~30-21mn as tolerated.    Extremity/Trunk Assessment Upper Extremity Assessment Upper Extremity Assessment: Defer to OT evaluation   Lower Extremity Assessment Lower Extremity Assessment: Defer to PT evaluation   Cervical / Trunk Assessment Cervical / Trunk Assessment: Back Surgery    Vision Baseline Vision/History: 0 No visual deficits Ability to See in Adequate Light: 0 Adequate Patient Visual Report: No change from baseline            Cognition Arousal/Alertness: Awake/alert Behavior During Therapy: WFL for tasks assessed/performed Overall Cognitive Status: Within Functional Limits for tasks assessed                General Comments Mother present during session    Pertinent Vitals/ Pain       Pain Assessment Pain Assessment: 0-10 Pain Score: 6  Faces Pain Scale: Hurts even more Pain Location: back, radiating down BLEs Pain Descriptors / Indicators: Grimacing, Guarding, Sore Pain Intervention(s): Limited activity within patient's tolerance, Monitored during session, Premedicated before session, Repositioned  Home Living  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 Please refer to OT initial Eval for details    Prior Functioning/Environment   Please refer to initial eval for details   Frequency  Min 2X/week        Progress Toward Goals  OT Goals(current goals can now be found in the care  plan section)  Progress towards OT goals: Progressing toward goals  Acute Rehab OT Goals Patient Stated Goal: Be able to care for herself and her 4 children OT Goal Formulation: With patient Time For Goal Achievement: 09/28/22 Potential to Achieve Goals: Good  Plan Discharge plan remains appropriate       AM-PAC OT "6 Clicks" Daily Activity     Outcome Measure     Help from another person taking care of personal grooming?: A Little Help from another person toileting, which includes using toliet, bedpan, or urinal?: Total Help from another person bathing (including washing, rinsing, drying)?: Total Help from another person to put on and taking off regular upper body clothing?: A Lot Help from another person to put on and taking off regular lower body clothing?: Total 6 Click Score: 8    End of Session Equipment Utilized During Treatment: Rolling walker (2 wheels);Gait belt  OT Visit Diagnosis: Unsteadiness on feet (R26.81);Other abnormalities of gait and mobility (R26.89);Pain Pain - Right/Left: Right Pain - part of body: Leg   Activity Tolerance Patient tolerated treatment well;Patient limited by pain   Patient Left in chair;with call bell/phone within reach;with family/visitor present;with nursing/sitter in room   Nurse Communication Mobility status        Time: 11610-9604OT Time Calculation (min): 24 min  Charges: OT General Charges $OT Visit: 1 Visit OT Treatments $Therapeutic Activity: 23-37 mins   Jacquez Sheetz Beth Dixon, OTR/L 09/15/2022, 12:22 PM

## 2022-09-16 NOTE — Progress Notes (Signed)
Physical Therapy Treatment Patient Details Name: Alejandra Barr MRN: [id number] DOB: 11-18-1985 Today's Date: 09/16/2022   History of Present Illness Patient is a 36 y/o female who presents from home on 09/12/22 with worsening back pain radiating to BLEs (RLE>LLE) and weakness. Found to have small fluid collection causing mild mass effect on thecal sac and mild spinal stenosis. Pt with recent thoracic tumor resection and T11 Laminectomy on 11/9. PMH includes chronic anxiety, chronic depression, DM, asthma.    PT Comments    Pt received in supine, agreeable to therapy session after premedication with IV pain meds, motivated to progress standing tolerance. Pt able to progress to longer household distance gait trial with RW and min guard for safety, pt not buckling but does report radiating pain to RLE and some numbness. Pt encouraged to try not relying on IV meds overnight and in AM next date to see if pain manageable for mobility when preparing to DC home. Pt continues to benefit from PT services to progress toward functional mobility goals.    Recommendations for follow up therapy are one component of a multi-disciplinary discharge planning process, led by the attending physician.  Recommendations may be updated based on patient status, additional functional criteria and insurance authorization.  Follow Up Recommendations  No PT follow up (pending pain improvement)     Assistance Recommended at Discharge Frequent or constant Supervision/Assistance  Patient can return home with the following A little help with walking and/or transfers;A little help with bathing/dressing/bathroom;Assistance with cooking/housework;Assist for transportation;Help with stairs or ramp for entrance   Equipment Recommendations  None recommended by PT    Recommendations for Other Services       Precautions / Restrictions Precautions Precautions: Back;Fall Precaution Comments: pt able to state  3/3 Restrictions Weight Bearing Restrictions: No     Mobility  Bed Mobility Overal bed mobility: Needs Assistance Bed Mobility: Rolling Rolling: Modified independent (Device/Increase time) Sidelying to sit: Min guard       General bed mobility comments: pt with good recall of precs; increased time to perform    Transfers Overall transfer level: Needs assistance Equipment used: Rolling walker (2 wheels) Transfers: Sit to/from Stand, Bed to chair/wheelchair/BSC Sit to Stand: Min guard           General transfer comment: Increased time, vc's to breathe and steady, pt pushing with LUE and maintains RUE on RW for stability, no LOB; elevated bed (per home setup) to RW and RW>chair    Ambulation/Gait Ambulation/Gait assistance: +2 safety/equipment, Min guard Gait Distance (Feet): 150 Feet Assistive device: Rolling walker (2 wheels) Gait Pattern/deviations: Step-through pattern, Decreased stride length, Antalgic       General Gait Details: cues for pursed-lip breathing, chair follow for safety but pt did not need seated break; pain significant throughout but worsening toward end of trial   Stairs Stairs:  (pt reports no need, reviewed step sequencing in case she needs to go up on step stool)            Balance Overall balance assessment: Needs assistance Sitting-balance support: Feet supported, Bilateral upper extremity supported Sitting balance-Leahy Scale: Fair Sitting balance - Comments: able to sit briefly unsupported but BUE support to offload discomfort   Standing balance support: Bilateral upper extremity supported, Reliant on assistive device for balance Standing balance-Leahy Scale: Fair Standing balance comment: reliant on UE support of walker, min guard for safety; pt able to remove single UE support briefly while static standing  Cognition Arousal/Alertness: Awake/alert Behavior During Therapy: WFL for tasks  assessed/performed Overall Cognitive Status: Within Functional Limits for tasks assessed                                 General Comments: motivated to progress        Exercises      General Comments General comments (skin integrity, edema, etc.): Mother present and assisting with peri care and chair follow.      Pertinent Vitals/Pain Pain Assessment Pain Assessment: 0-10 Pain Score: 5  Faces Pain Scale: Hurts even more Pain Location: back, radiating down BLEs, worse on her R side, worse with transfers/gait Pain Descriptors / Indicators: Grimacing, Guarding, Sore, Radiating, Sharp Pain Intervention(s): Monitored during session, Premedicated before session, Repositioned, Ice applied           PT Goals (current goals can now be found in the care plan section) Acute Rehab PT Goals Patient Stated Goal: decrease pain, get back to life, be able to bend down to play with my kids. PT Goal Formulation: With patient Time For Goal Achievement: 09/28/22 Progress towards PT goals: Progressing toward goals    Frequency    Min 4X/week      PT Plan Current plan remains appropriate       AM-PAC PT "6 Clicks" Mobility   Outcome Measure  Help needed turning from your back to your side while in a flat bed without using bedrails?: None Help needed moving from lying on your back to sitting on the side of a flat bed without using bedrails?: A Little Help needed moving to and from a bed to a chair (including a wheelchair)?: A Little Help needed standing up from a chair using your arms (e.g., wheelchair or bedside chair)?: A Little Help needed to walk in hospital room?: A Little Help needed climbing 3-5 steps with a railing? : A Lot 6 Click Score: 18    End of Session Equipment Utilized During Treatment: Gait belt Activity Tolerance: Patient tolerated treatment well;Patient limited by pain Patient left: with call bell/phone within reach;with family/visitor present;in  chair (mother present; ice to her back) Nurse Communication: Mobility status PT Visit Diagnosis: Muscle weakness (generalized) (M62.81);Other symptoms and signs involving the nervous system (R29.898);Pain;Difficulty in walking, not elsewhere classified (R26.2) Pain - part of body:  (Back, RLE)     Time: 2878-6767 PT Time Calculation (min) (ACUTE ONLY): 34 min  Charges:  $Gait Training: 8-22 mins $Therapeutic Activity: 8-22 mins                     Pawel Soules P., PTA Acute Rehabilitation Services Secure Chat Preferred 9a-5:30pm Office: [place] 09/16/2022, 2:53 PM

## 2022-09-16 NOTE — Progress Notes (Signed)
   Providing Compassionate, Quality Care - Together  NEUROSURGERY PROGRESS NOTE   S: No issues overnight. Able to walk in hallway yesterday, mother requesting her to stay one more day  O: EXAM:  BP 102/73 (BP Location: Right Wrist)   Pulse 73   Temp 98.3 F (36.8 C)   Resp 18   Ht '5\' 1"'$  (1.549 m)   Wt 94.8 kg   LMP 09/03/2022 (Exact Date)   SpO2 99%   BMI 39.49 kg/m   Awake, alert, oriented x3 PERRL Speech fluent, appropriate  CNs grossly intact  5/5 BUE/BLE  Wound clean dry and intact   ASSESSMENT:  36 y.o. female with    Acute on chronic low back pain with radiating radiculopathy T11 schwannoma, status post resection   PLAN: -Continue mobility, continue steroids, continue pain control -DC tomorrow     Thank you for allowing me to participate in this patient's care.  Please do not hesitate to call with questions or concerns.   Elwin Sleight, [facility] Cell: [phone]

## 2022-09-16 NOTE — Plan of Care (Signed)

## 2022-09-17 MED ORDER — DEXAMETHASONE 2 MG PO TABS: ORAL_TABLET | ORAL | 0 refills | Status: AC

## 2022-09-17 NOTE — Plan of Care (Signed)

## 2022-09-17 NOTE — Consult Note (Signed)
   [facility] CM Inpatient Consult   09/17/2022  [name] 04-Nov-1985 [id number]  [facility] [ACO] Patient: Alejandra Barr [facility] PPO  Primary Care Provider:  Fayrene Helper, MD, [facility] is listed to provide the transition of care follow up   Patient screened for less than 7 days readmission hospitalization  for unplanned readmission.  Also, to assess for potential [place] service needs for post hospital transition for care coordination.  Review of patient's electronic medical record reveals patient is for transitioning home.  Called and spoke with patient via hospital operator assisted call at the bedside to assess for post hospital community follow up needs.  HIPAA verified and she endorses provider.  Patient states she has good family support and SDOH is intact with no needs verbalized.  Offered post hospital community support for any care coordination.  Patient states her spouse is nurse and she has great support.   Plan:    No [facility] referral request for community care coordination  assessed at this time: encouraged to contact provider if needs changes.  Patient verbalized understanding.  Of note, [facility] Care Management/Population Health does not replace or interfere with any arrangements made by the Inpatient Transition of Care team.  For questions contact:   Natividad Brood, RN BSN [place]  [phone] business mobile phone Toll free office [phone]  *[place]  [phone] Fax number: [phone] [email or web address]

## 2022-09-17 NOTE — Discharge Summary (Signed)
Physician Discharge Summary  Patient ID: Alejandra Barr MRN: [id number] DOB/AGE: [birth year] 37 y.o.  Admit date: 09/12/2022 Discharge date: 09/17/2022  Admission Diagnoses:  Acute on chronic low back pain with radiculopathy Status post T11 laminectomy for schwannoma resection  Discharge Diagnoses:  Same Principal Problem:   Weakness   Discharged Condition: Stable  Hospital Course:  Alejandra Barr is a 36 y.o. female underwent T11 laminectomy for schwannoma resection by Dr. Zada Finders, tolerated surgery well.  Postoperatively she had intractable acute low back pain, exacerbation of her chronic symptoms and return to the emergency department.  Imaging revealed stable MRI lumbar spine, with postoperative changes in the thoracic spine without acute pathology.  She was admitted for pain control, which progressively improved with steroids and narcotics.  Her neurologic exam remained stable, she was ambulating independently, tolerating normal diet.  Her incision was clean dry and intact.  Treatments: Admission for pain control  Discharge Exam: Blood pressure (!) 91/55, pulse 70, temperature 98.1 F (36.7 C), temperature source Oral, resp. rate 16, height '5\' 1"'$  (1.549 m), weight 94.8 kg, last menstrual period 09/03/2022, SpO2 98 %. Awake, alert, oriented x3 Speech fluent, appropriate CN grossly intact 5/5 BUE/BLE Wound c/d/i  Disposition: Discharge disposition: 01-Home or Self Care        Allergies as of 09/17/2022       Reactions   Ciprofloxacin Anaphylaxis   Morphine And Related Anaphylaxis   Pt states she can tolerate hydromorphone   Penicillins Anaphylaxis   Has patient had a PCN reaction causing immediate rash, facial/tongue/throat swelling, SOB or lightheadedness with hypotension: No Has patient had a PCN reaction causing severe rash involving mucus membranes or skin necrosis: No Has patient had a PCN reaction that required hospitalization: No Has patient had a  PCN reaction occurring within the last 10 years: No If all of the above answers are "NO", then may proceed with Cephalosporin use.        Medication List     TAKE these medications    acetaminophen 325 MG tablet Commonly known as: TYLENOL Take 650 mg by mouth every 6 (six) hours as needed for mild pain or moderate pain.   albuterol 108 (90 Base) MCG/ACT inhaler Commonly known as: VENTOLIN HFA Inhale 2 puffs into the lungs every 6 (six) hours as needed for wheezing or shortness of breath.   Botox 200 units injection Generic drug: botulinum toxin Type A 155 Units See admin instructions. INJECT 155 UNITS EVERY THREE MONTHS FOR CHRONIC MIGRAINES.   cyanocobalamin 1000 MCG/ML injection Commonly known as: VITAMIN B12 Inject 1 mL (1,000 mcg total) into the muscle once a week.   cyclobenzaprine 10 MG tablet Commonly known as: FLEXERIL Take 1 tablet (10 mg total) by mouth 3 (three) times daily as needed for muscle spasms.   dexamethasone 2 MG tablet Commonly known as: DECADRON Take 2 tablets (4 mg total) by mouth 3 (three) times daily for 3 days, THEN 1 tablet (2 mg total) every 6 (six) hours for 3 days, THEN 1 tablet (2 mg total) every 8 (eight) hours for 3 days, THEN 1 tablet (2 mg total) 2 (two) times daily for 3 days, THEN 1 tablet (2 mg total) daily for 3 days. Start taking on: September 17, 2022   DULoxetine 30 MG capsule Commonly known as: CYMBALTA Take 30 mg by mouth 2 (two) times daily.   gabapentin 300 MG capsule Commonly known as: NEURONTIN Take 300 mg by mouth 2 (two) times daily.   iron  polysaccharides 150 MG capsule Commonly known as: Ferrex 150 Take 1 capsule (150 mg total) by mouth daily.   levothyroxine 75 MCG tablet Commonly known as: SYNTHROID Take 75 mcg by mouth daily before breakfast.   metoprolol succinate 25 MG 24 hr tablet Commonly known as: TOPROL-XL TAKE 2 TABLETS DAILY (DOSE INCREASE) What changed:  how much to take how to take this when to  take this   NEEDLE (DISP) 25 G 25G X 1-1/2" Misc To be used with B 12 injections   OVER THE COUNTER MEDICATION BC powder   oxyCODONE 5 MG immediate release tablet Commonly known as: Oxy IR/ROXICODONE Take 1 tablet (5 mg total) by mouth every 4 (four) hours as needed for moderate pain ((score 4 to 6)).   phentermine 37.5 MG tablet Commonly known as: ADIPEX-P Take 1 tablet (37.5 mg total) by mouth daily before breakfast.   PRENATAL PO Take 1 tablet by mouth daily.   topiramate 100 MG tablet Commonly known as: TOPAMAX Take 100 mg by mouth 2 (two) times daily.   Ubrelvy 100 MG Tabs Generic drug: Ubrogepant Take 100 mg by mouth See admin instructions. Take 1 tablet by mouth once as needed for migraines. May take one additional dose 2 hours after for maximum daily dose of '200mg'$    VENOFER IV Inject into the vein. Every three months   Vitamin D (Ergocalciferol) 1.25 MG (50000 UNIT) Caps capsule Commonly known as: DRISDOL Take 1 capsule (50,000 Units total) by mouth once a week. What changed: additional instructions        Follow-up Information     Judith Part, MD Follow up.   Specialty: Neurosurgery Why: as scheduled Contact information: [place] [address] [phone]                 Signed: Theodoro Doing Esley Brooking 09/17/2022, 8:55 AM

## 2022-09-22 ENCOUNTER — Inpatient Hospital Stay: Payer: BC Managed Care – PPO

## 2022-10-02 ENCOUNTER — Telehealth: Payer: BC Managed Care – PPO

## 2022-10-08 ENCOUNTER — Encounter (HOSPITAL_COMMUNITY): Payer: Self-pay | Admitting: Physical Therapy

## 2022-10-08 NOTE — Therapy (Signed)
[place], [place], [address] Phone: [phone]   Fax:  [phone]  Patient Details  Name: Alejandra Barr MRN: [id number] Date of Birth: 22-Mar-1986 Referring Provider:  No ref. provider found  Encounter Date: 10/08/2022  PHYSICAL THERAPY DISCHARGE SUMMARY  Visits from Start of Care: 4  Current functional level related to goals / functional outcomes: NA   Remaining deficits: NA   Education / Equipment: Patient DC per non return    Patient agrees to discharge. Patient goals were not met. Patient is being discharged due to not returning since the last visit.  Elizbeth Squires, PT 10/08/2022, 11:19 AM  [place], [place], [address] Phone: [phone]   Fax:  [phone]

## 2022-10-12 ENCOUNTER — Other Ambulatory Visit: Payer: Self-pay | Admitting: Physician Assistant

## 2022-10-12 DIAGNOSIS — I491 Atrial premature depolarization: Secondary | ICD-10-CM

## 2022-10-12 DIAGNOSIS — I493 Ventricular premature depolarization: Secondary | ICD-10-CM

## 2022-10-12 DIAGNOSIS — I4892 Unspecified atrial flutter: Secondary | ICD-10-CM

## 2022-10-12 DIAGNOSIS — R002 Palpitations: Secondary | ICD-10-CM

## 2022-10-12 DIAGNOSIS — Z01810 Encounter for preprocedural cardiovascular examination: Secondary | ICD-10-CM

## 2022-10-19 ENCOUNTER — Other Ambulatory Visit: Payer: BC Managed Care – PPO

## 2022-10-20 ENCOUNTER — Encounter: Payer: Self-pay | Admitting: Physician Assistant

## 2022-11-03 ENCOUNTER — Ambulatory Visit: Payer: BC Managed Care – PPO | Admitting: Internal Medicine

## 2022-11-05 ENCOUNTER — Encounter: Payer: Self-pay | Admitting: Family Medicine

## 2022-11-12 MED ORDER — PHENTERMINE HCL 37.5 MG PO TABS: 37.5000 mg | ORAL_TABLET | Freq: Every day | ORAL | 0 refills | Status: DC

## 2022-11-17 ENCOUNTER — Ambulatory Visit: Payer: BC Managed Care – PPO | Admitting: Family Medicine

## 2022-11-24 ENCOUNTER — Encounter: Payer: Self-pay | Admitting: Internal Medicine

## 2022-11-24 ENCOUNTER — Ambulatory Visit: Payer: BC Managed Care – PPO | Attending: Internal Medicine | Admitting: Internal Medicine

## 2022-11-24 VITALS — BP 114/70 | HR 97 | Ht 61.0 in | Wt 226.0 lb

## 2022-11-24 DIAGNOSIS — R0602 Shortness of breath: Secondary | ICD-10-CM

## 2022-11-24 DIAGNOSIS — I493 Ventricular premature depolarization: Secondary | ICD-10-CM | POA: Insufficient documentation

## 2022-11-24 MED ORDER — METOPROLOL SUCCINATE ER 25 MG PO TB24: 50.0000 mg | ORAL_TABLET | Freq: Every day | ORAL | 3 refills | Status: DC

## 2022-11-24 NOTE — Progress Notes (Signed)
Cardiology Office Note  Date: 11/24/2022   ID: Mildreth, Reek 1986/01/03, MRN [id number]  PCP:  Fayrene Helper, MD  Cardiologist:  Chalmers Guest, MD Electrophysiologist:  None   Reason for Office Visit: Follow-up of atrial flutter status post ablation 20 years ago, in 2004  History of Present Illness: JERYN BERTONI is a 37 y.o. female known to have atrial flutter s/p ablation in 2004, iron deficiency anemia, B12 deficiency, anxiety/depression presented to cardiology clinic for follow-up visit.  Patient underwent successful RFA ablation of atrial flutter by Dr. Lovena Le in 2004.  She does have a family history of arrhythmias, atrial fibrillation with multiple ablations in her mother who was a longstanding patient of Dr. Caryl Comes and her grandmother had arrhythmias as well. Last event monitor from 2020 showed normal sinus rhythm to sinus tachycardia, isolated PACs and 2.6% PVC's without any arrhythmias. Average HR 99 bpm.  Patient was also having back pain for the last 8 years but was recently diagnosed with Schwannoma of spine and currently undergoing surgeries. She has palpitations lasting for a few seconds to sometimes a minutes, no dizziness/lightheadedness or syncope and denies any angina but complains of SOB in the last few months.  Past Medical History:  Diagnosis Date   Asthma    Atrial flutter ([place])    Status post RFA by Dr. 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    Iron deficiency    Iron deficiency anemia 10/02/2010   Qualifier: Diagnosis of  By: Moshe Cipro MD, Margaret     Low vitamin B12 level 07/10/2013   Overview:  Last Assessment & Plan:  Being treated through hematology   Newborn product of  in vitro fertilization (IVF) pregnancy    Obesity    PONV (postoperative nausea and vomiting)     Past Surgical History:  Procedure Laterality Date   Bilateral eustachian tube placement on 4 different occasions     CARDIAC ELECTROPHYSIOLOGY [place]  2004   for aflutter/afib   CARPAL TUNNEL RELEASE Bilateral 05/2018   CESAREAN SECTION N/A 05/26/2017   Procedure: CESAREAN SECTION;  Surgeon: Linda Hedges, DO;  Location: [facility];  Service: Obstetrics;  Laterality: N/A;   CESAREAN SECTION N/A 03/01/2021   Procedure: Repeat CESAREAN SECTION;  Surgeon: Brien Few, MD;  Location: [facility] LD ORS;  Service: Obstetrics;  Laterality: N/A;  EDD: 03/07/21   CESAREAN SECTION MULTI-GESTATIONAL N/A 02/10/2019   Procedure: Repeat CESAREAN SECTION MULTI-GESTATIONAL;  Surgeon: Brien Few, MD;  Location: [facility] LD [facility]S;  Service: Obstetrics;  Laterality: N/A;  EDD: 03/15/19 Allergy: Penicillin, Morphine, Cipro   CHOLECYSTECTOMY  2003   COLONOSCOPY N/A 11/29/2013   Procedure: COLONOSCOPY;  Surgeon: Rogene Houston, MD;  [facility];  Service: Endoscopy;  Laterality: N/A;  120-rescheduled to 300 Ann notified pt   GASTRIC BYPASS  2006   LAMINECTOMY N/A 09/10/2022   Procedure: 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;  Surgeon: Judith Part, MD;  Location: [facility];  Service: Neurosurgery;  Laterality: N/A;  RM 20 to follow   TONSILLECTOMY  1990    Current Outpatient Medications  Medication Sig Dispense Refill  acetaminophen (TYLENOL) 325 MG tablet Take 650 mg by mouth every 6 (six) hours as needed for mild pain or moderate pain.     albuterol (VENTOLIN HFA) 108 (90 Base) MCG/ACT inhaler Inhale 2 puffs into the lungs every 6 (six) hours as needed for wheezing or shortness of breath. 1 each 0   Botulinum Toxin Type A (BOTOX) 200 units SOLR 155 Units See admin instructions. INJECT 155 UNITS EVERY THREE MONTHS FOR CHRONIC MIGRAINES.      cyanocobalamin (,VITAMIN B-12,) 1000 MCG/ML injection Inject 1 mL (1,000 mcg total) into the muscle once a week. 5 mL 11   cyclobenzaprine (FLEXERIL) 10 MG tablet Take 1 tablet (10 mg total) by mouth 3 (three) times daily as needed for muscle spasms. 30 tablet 0   DULoxetine (CYMBALTA) 30 MG capsule Take 30 mg by mouth 2 (two) times daily.     gabapentin (NEURONTIN) 300 MG capsule Take 300 mg by mouth 2 (two) times daily.     iron polysaccharides (FERREX 150) 150 MG capsule Take 1 capsule (150 mg total) by mouth daily. 30 capsule 1   Iron Sucrose (VENOFER IV) Inject into the vein. Every three months     levothyroxine (SYNTHROID) 75 MCG tablet Take 75 mcg by mouth daily before breakfast.     NEEDLE, DISP, 25 G 25G X 1-1/2" MISC To be used with B 12 injections 10 each 0   OVER THE COUNTER MEDICATION BC powder     phentermine (ADIPEX-P) 37.5 MG tablet Take 1 tablet (37.5 mg total) by mouth daily before breakfast. 30 tablet 0   Prenatal Vit-Fe Fumarate-FA (PRENATAL PO) Take 1 tablet by mouth daily.     UBRELVY 100 MG TABS Take 100 mg by mouth See admin instructions. Take 1 tablet by mouth once as needed for migraines. May take one additional dose 2 hours after for maximum daily dose of '200mg'$      Vitamin D, Ergocalciferol, (DRISDOL) 1.25 MG (50000 UNIT) CAPS capsule Take 1 capsule (50,000 Units total) by mouth once a week. (Patient taking differently: Take 50,000 Units by mouth once a week. Monday) 5 capsule 11   metoprolol succinate (TOPROL-XL) 25 MG 24 hr tablet Take 2 tablets (50 mg total) by mouth daily. 180 tablet 3   oxyCODONE (OXY IR/ROXICODONE) 5 MG immediate release tablet Take 1 tablet (5 mg total) by mouth every 4 (four) hours as needed for moderate pain ((score 4 to 6)). (Patient not taking: Reported on 11/24/2022) 30 tablet 0   topiramate (TOPAMAX) 100 MG tablet Take 100 mg by mouth 2 (two) times daily. (Patient not taking: Reported on 11/24/2022)     No current facility-administered  medications for this visit.   Allergies:  Ciprofloxacin, Morphine and related, and Penicillins   Social History: The patient  reports that she quit smoking about 7 years ago. Her smoking use included cigarettes. She started smoking about 9 years ago. She smoked an average of .5 packs per day. She has never used smokeless tobacco. She reports that she does not drink alcohol and does not use drugs.   Family History: The patient's family history includes Arrhythmia in her mother; Breast cancer (age of onset: 70) in her mother; Diabetes in her maternal grandmother; Fibromyalgia in her mother; Heart defect in her mother; Heart disease in her paternal grandfather and paternal grandmother; Hypertension in her maternal grandfather, maternal grandmother, paternal grandfather, and paternal grandmother; Hypothyroidism in her mother; Obesity in her mother.   ROS:  Please see the history  of present illness. Otherwise, complete review of systems is positive for none.  All other systems are reviewed and negative.   Physical Exam: VS:  BP 114/70   Pulse 97   Ht '5\' 1"'$  (1.549 m)   Wt 226 lb (102.5 kg)   SpO2 100%   BMI 42.70 kg/m , BMI Body mass index is 42.7 kg/m.  Wt Readings from Last 3 Encounters:  11/24/22 226 lb (102.5 kg)  09/12/22 209 lb (94.8 kg)  09/10/22 209 lb (94.8 kg)    General: Patient appears comfortable at rest. HEENT: Conjunctiva and lids normal, oropharynx clear with moist mucosa. Neck: Supple, no elevated JVP or carotid bruits, no thyromegaly. Lungs: Clear to auscultation, nonlabored breathing at rest. Cardiac: Regular rate and rhythm, no S3 or significant systolic murmur, no pericardial rub. Abdomen: Soft, nontender, no hepatomegaly, bowel sounds present, no guarding or rebound. Extremities: No pitting edema, distal pulses 2+. Skin: Warm and dry. Musculoskeletal: No kyphosis. Neuropsychiatric: Alert and oriented x3, affect grossly appropriate.  ECG:  An ECG dated 09/2022 was  personally reviewed today and demonstrated:  NSR  Recent Labwork: 08/14/2022: TSH 2.240 09/12/2022: ALT 147; AST 138; BUN 12; Creatinine, Ser 0.65; Hemoglobin 12.9; Platelets 270; Potassium 3.9; Sodium 139     Component Value Date/Time   CHOL 160 03/10/2022 1148   TRIG 73 03/10/2022 1148   HDL 52 03/10/2022 1148   CHOLHDL 3.1 03/10/2022 1148   VLDL 15 03/10/2022 1148   LDLCALC 93 03/10/2022 1148   [place] 90 05/10/2018 0805    Other Studies Reviewed Today:   Assessment and Plan:  Patient is a 37 year old F known to have atrial flutter s/p RFA ablation in 2004 by Dr. Lovena Le, 2.6% PVC burden, Schwannoma of spine s/p surgeries presented to cardiology clinic for follow-up visit.  # Atrial flutter s/p RF ablation in 2004, currently in normal sinus rhythm # 2.6% PVC burden per 2020 event monitor, symptomatic -Continue metoprolol succinate 50 mg once daily -No indication of anticoagulation after 20 years of ablation and patient has been in normal sinus rhythm since then with no history of stroke. -Recommend [employer] mobile for arrhythmia monitoring (patient is a Marine scientist).  # SOB -Patient is reporting SOB associated with palpitations in the last few months.  Last echo cardiogram was in 2004 which was normal per patient. Obtain 2D echocardiogram.  I have spent a total of 30 minutes with patient reviewing chart, EKGs, labs and examining patient as well as establishing an assessment and plan that was discussed with the patient.  > 50% of time was spent in direct patient care.      Medication Adjustments/Labs and Tests Ordered: Current medicines are reviewed at length with the patient today.  Concerns regarding medicines are outlined above.   Tests Ordered: Orders Placed This Encounter  Procedures   ECHOCARDIOGRAM COMPLETE    Medication Changes: Meds ordered this encounter  Medications   metoprolol succinate (TOPROL-XL) 25 MG 24 hr tablet    Sig: Take 2 tablets (50 mg total) by mouth  daily.    Dispense:  180 tablet    Refill:  3    Disposition:  Follow up  1 year  Signed Emmalynne Courtney Fidel Levy, MD, 11/24/2022 4:04 PM    [facility], [place], [place] [address]

## 2022-11-24 NOTE — Patient Instructions (Addendum)
Medication Instructions:  Your physician recommends that you continue on your current medications as directed. Please refer to the Current Medication list given to you today.  Labwork: none  Testing/Procedures: Your physician has requested that you have an echocardiogram. Echocardiography is a painless test that uses sound waves to create images of your heart. It provides your doctor with information about the size and shape of your heart and how well your heart's chambers and valves are working. This procedure takes approximately one hour. There are no restrictions for this procedure. Please do NOT wear cologne, perfume, aftershave, or lotions (deodorant is allowed). Please arrive 15 minutes prior to your appointment time.  Follow-Up: Your physician recommends that you schedule a follow-up appointment in: 1 year. You will receive a reminder call in the mail in about 10 months reminding you to call and schedule your appointment. If you don't receive this call, please contact our office.  Any Other Special Instructions Will Be Listed Below (If Applicable).  If you need a refill on your cardiac medications before your next appointment, please call your pharmacy.

## 2022-12-08 ENCOUNTER — Other Ambulatory Visit: Payer: Self-pay | Admitting: Neurological Surgery

## 2022-12-09 ENCOUNTER — Ambulatory Visit: Payer: BC Managed Care – PPO | Admitting: Family Medicine

## 2022-12-14 ENCOUNTER — Inpatient Hospital Stay: Payer: BC Managed Care – PPO | Attending: Hematology

## 2022-12-14 DIAGNOSIS — D500 Iron deficiency anemia secondary to blood loss (chronic): Secondary | ICD-10-CM

## 2022-12-14 DIAGNOSIS — E559 Vitamin D deficiency, unspecified: Secondary | ICD-10-CM | POA: Insufficient documentation

## 2022-12-14 DIAGNOSIS — R7989 Other specified abnormal findings of blood chemistry: Secondary | ICD-10-CM

## 2022-12-14 DIAGNOSIS — D509 Iron deficiency anemia, unspecified: Secondary | ICD-10-CM | POA: Insufficient documentation

## 2022-12-14 DIAGNOSIS — N920 Excessive and frequent menstruation with regular cycle: Secondary | ICD-10-CM | POA: Insufficient documentation

## 2022-12-14 DIAGNOSIS — K909 Intestinal malabsorption, unspecified: Secondary | ICD-10-CM | POA: Diagnosis not present

## 2022-12-14 DIAGNOSIS — E538 Deficiency of other specified B group vitamins: Secondary | ICD-10-CM | POA: Insufficient documentation

## 2022-12-14 LAB — CBC WITH DIFFERENTIAL/PLATELET
Abs Immature Granulocytes: 0.04 10*3/uL (ref 0.00–0.07)
Basophils Absolute: 0.1 10*3/uL (ref 0.0–0.1)
Basophils Relative: 1 %
Eosinophils Absolute: 0.3 10*3/uL (ref 0.0–0.5)
Eosinophils Relative: 3 %
HCT: 39.9 % (ref 36.0–46.0)
Hemoglobin: 13 g/dL (ref 12.0–15.0)
Immature Granulocytes: 1 %
Lymphocytes Relative: 26 %
Lymphs Abs: 2.2 10*3/uL (ref 0.7–4.0)
MCH: 29.5 pg (ref 26.0–34.0)
MCHC: 32.6 g/dL (ref 30.0–36.0)
MCV: 90.5 fL (ref 80.0–100.0)
Monocytes Absolute: 0.5 10*3/uL (ref 0.1–1.0)
Monocytes Relative: 6 %
Neutro Abs: 5.3 10*3/uL (ref 1.7–7.7)
Neutrophils Relative %: 63 %
Platelets: 267 10*3/uL (ref 150–400)
RBC: 4.41 MIL/uL (ref 3.87–5.11)
RDW: 12.9 % (ref 11.5–15.5)
WBC: 8.3 10*3/uL (ref 4.0–10.5)
nRBC: 0 % (ref 0.0–0.2)

## 2022-12-14 LAB — IRON AND TIBC
Iron: 71 ug/dL (ref 28–170)
Saturation Ratios: 19 % (ref 10.4–31.8)
TIBC: 373 ug/dL (ref 250–450)
UIBC: 302 ug/dL

## 2022-12-14 LAB — VITAMIN B12: Vitamin B-12: 128 pg/mL — ABNORMAL LOW (ref 180–914)

## 2022-12-14 LAB — FERRITIN: Ferritin: 109 ng/mL (ref 11–307)

## 2022-12-16 DIAGNOSIS — G43711 Chronic migraine without aura, intractable, with status migrainosus: Secondary | ICD-10-CM | POA: Diagnosis not present

## 2022-12-16 DIAGNOSIS — M545 Low back pain, unspecified: Secondary | ICD-10-CM | POA: Diagnosis not present

## 2022-12-16 DIAGNOSIS — G8929 Other chronic pain: Secondary | ICD-10-CM | POA: Diagnosis not present

## 2022-12-16 DIAGNOSIS — G894 Chronic pain syndrome: Secondary | ICD-10-CM | POA: Diagnosis not present

## 2022-12-16 DIAGNOSIS — M546 Pain in thoracic spine: Secondary | ICD-10-CM | POA: Diagnosis not present

## 2022-12-18 LAB — METHYLMALONIC ACID, SERUM: Methylmalonic Acid, Quantitative: 166 nmol/L (ref 0–378)

## 2022-12-21 ENCOUNTER — Inpatient Hospital Stay (HOSPITAL_BASED_OUTPATIENT_CLINIC_OR_DEPARTMENT_OTHER): Payer: BC Managed Care – PPO | Admitting: Physician Assistant

## 2022-12-21 ENCOUNTER — Encounter: Payer: Self-pay | Admitting: Physician Assistant

## 2022-12-21 VITALS — Wt 223.0 lb

## 2022-12-21 DIAGNOSIS — R7989 Other specified abnormal findings of blood chemistry: Secondary | ICD-10-CM | POA: Diagnosis not present

## 2022-12-21 DIAGNOSIS — E559 Vitamin D deficiency, unspecified: Secondary | ICD-10-CM

## 2022-12-21 DIAGNOSIS — E538 Deficiency of other specified B group vitamins: Secondary | ICD-10-CM

## 2022-12-21 DIAGNOSIS — D508 Other iron deficiency anemias: Secondary | ICD-10-CM | POA: Diagnosis not present

## 2022-12-21 DIAGNOSIS — D500 Iron deficiency anemia secondary to blood loss (chronic): Secondary | ICD-10-CM

## 2022-12-21 MED ORDER — CYANOCOBALAMIN 1000 MCG/ML IJ SOLN: 1000.0000 ug | INTRAMUSCULAR | 10 refills | Status: DC

## 2022-12-21 MED ORDER — "NEEDLE (DISP) 25G X 1-1/2"" MISC": 2 refills | Status: AC

## 2022-12-21 NOTE — Progress Notes (Signed)
VIRTUAL VISIT via [facility]   I connected with Alejandra Barr  on 12/21/2022 at 4:17 PM by telephone and verified that I am speaking with the correct person using two identifiers.  Location: Patient: Home Provider: [facility]   I discussed the limitations, risks, security and privacy concerns of performing an evaluation and management service by telephone and the availability of in person appointments. I also discussed with the patient that there may be a patient responsible charge related to this service. The patient expressed understanding and agreed to proceed.  REASON FOR VISIT: Iron deficiency anemia and B12 deficiency  CURRENT THERAPY: Intermittent IV iron  INTERVAL HISTORY:  Alejandra Barr is contacted today for follow-up of her iron deficiency anemia and B12 deficiency.  She was last seen in clinic by Tarri Abernethy PA-C on 06/23/2022.  At today's visit, she reports feeling "run down."  She continues to have menorrhagia but denies bright red blood per rectum or melena.  She reports ongoing fatigue with energy about 25%.  She denies any pica, restless legs, chest pain, dyspnea on exertion, lightheadedness, or syncope.  She has chronic migraine headaches.  She is continuing to take her daily iron supplements, weekly folic acid supplement, weekly vitamin D supplement, and weekly B12 injections.  She has 25% energy and 100% appetite. She endorses that she is maintaining a stable weight.  REVIEW OF SYSTEMS:   Review of Systems  Constitutional:  Positive for malaise/fatigue. Negative for chills, diaphoresis, fever and weight loss.  Respiratory:  Negative for cough and shortness of breath.   Cardiovascular:  Positive for palpitations. Negative for chest pain.  Gastrointestinal:  Negative for abdominal pain, blood in stool, melena, nausea and vomiting.  Musculoskeletal:  Positive for back pain.  Neurological:  Positive for  tingling. Negative for dizziness and headaches.  Psychiatric/Behavioral:  The patient has insomnia.      PHYSICAL EXAM: (per limitations of virtual telephone visit)  The patient is alert and oriented x 3, exhibiting adequate mentation, good mood, and ability to speak in full sentences and execute sound judgement.  ASSESSMENT & PLAN:  1.  Iron deficiency anemia: - History of gastric bypass surgery -- Venofer 300 mg x 3 from 07/03/2022 through 08/03/2022 - Iron deficiency anemia is caused by menorrhagia and malabsorption  - Symptoms improved after IV iron initially improved, but she has recurrent fatigue with energy about 25% - Most recent labs (12/14/2022): Hgb 13.0/MCV 90.5, ferritin 109, iron saturation 19% - Currently taking prenatal vitamin and ferrous sulfate 325 mg daily. - PLAN: Continue oral iron tablet once daily. - RTC in 3 months with repeat labs. (Patient prefers follow-up every 3 months due to sudden drops in her iron).  Prefers phone visits, but she is agreeable to annual in person visit.   2.  Vitamin B12 deficiency: - Currently taking B12 injection once weekly, but reports that she previously had to be on injections twice a week - Most recent B12 (12/14/2022) level low at 128, MMA normal - PLAN: We will increase her B12 injections to TWICE WEEKLY.   Recheck B12/methylmalonic acid in 3 months (May 2024)   3.  Vitamin D deficiency: - Most recent vitamin D level (06/16/2022) normal at 31.53 - Currently taking ergocalciferol 50,000 units weekly   - PLAN: Continue vitamin D once a week.  Recheck vitamin D in 3 months (May 2024)   4.  Folic acid deficiency: - Folate checked on 06/16/2022  normal at 9.9 - Currently taking folic acid 1 mg daily     - PLAN: Continue folic acid supplement.  Recheck folic acid in May [id number]  PLAN SUMMARY: >> Prescription sent to pharmacy for vitamin B12 injections TWICE weekly >> Labs in 3 months (CBC/D, ferritin, iron/TIBC, B12, MMA, vitamin D,  folate) >> PHONE visit in 3 months (1 week after labs)     I discussed the assessment and treatment plan with the patient. The patient was provided an opportunity to ask questions and all were answered. The patient agreed with the plan and demonstrated an understanding of the instructions.   The patient was advised to call back or seek an in-person evaluation if the symptoms worsen or if the condition fails to improve as anticipated.  I provided 22 minutes of non-face-to-face time during this encounter.   Harriett Rush, PA-C 12/21/2022 4:37 PM

## 2022-12-22 ENCOUNTER — Other Ambulatory Visit: Payer: Self-pay

## 2022-12-22 DIAGNOSIS — D508 Other iron deficiency anemias: Secondary | ICD-10-CM

## 2022-12-22 DIAGNOSIS — D500 Iron deficiency anemia secondary to blood loss (chronic): Secondary | ICD-10-CM

## 2022-12-22 DIAGNOSIS — E559 Vitamin D deficiency, unspecified: Secondary | ICD-10-CM

## 2022-12-22 DIAGNOSIS — E538 Deficiency of other specified B group vitamins: Secondary | ICD-10-CM

## 2022-12-22 DIAGNOSIS — R7989 Other specified abnormal findings of blood chemistry: Secondary | ICD-10-CM

## 2022-12-23 ENCOUNTER — Ambulatory Visit: Payer: BC Managed Care – PPO | Attending: Internal Medicine

## 2022-12-23 DIAGNOSIS — R0602 Shortness of breath: Secondary | ICD-10-CM

## 2022-12-24 LAB — ECHOCARDIOGRAM COMPLETE
AR max vel: 1.98 cm2
AV Area VTI: 2.13 cm2
AV Area mean vel: 2 cm2
AV Mean grad: 2.9 mmHg
AV Peak grad: 5.1 mmHg
Ao pk vel: 1.13 m/s
Area-P 1/2: 3.77 cm2
Calc EF: 70.1 %
S' Lateral: 2.7 cm
Single Plane A2C EF: 67.3 %
Single Plane A4C EF: 73.3 %

## 2023-01-07 ENCOUNTER — Ambulatory Visit: Payer: BC Managed Care – PPO | Admitting: Family Medicine

## 2023-01-13 DIAGNOSIS — M546 Pain in thoracic spine: Secondary | ICD-10-CM | POA: Diagnosis not present

## 2023-01-13 DIAGNOSIS — G894 Chronic pain syndrome: Secondary | ICD-10-CM | POA: Diagnosis not present

## 2023-01-13 DIAGNOSIS — G8929 Other chronic pain: Secondary | ICD-10-CM | POA: Diagnosis not present

## 2023-01-13 DIAGNOSIS — M545 Low back pain, unspecified: Secondary | ICD-10-CM | POA: Diagnosis not present

## 2023-01-13 DIAGNOSIS — M542 Cervicalgia: Secondary | ICD-10-CM | POA: Diagnosis not present

## 2023-01-14 ENCOUNTER — Encounter: Payer: Self-pay | Admitting: Hematology

## 2023-01-18 NOTE — Pre-Procedure Instructions (Signed)
Surgical Instructions    Your procedure is scheduled on January 21, 2023.  Report to [facility] Main Entrance "A" at 5:30 A.M., then check in with the Admitting office.  Call this number if you have problems the morning of surgery:  [phone]  If you have any questions prior to your surgery date call [phone]: Open Monday-Friday 8am-4pm If you experience any cold or flu symptoms such as cough, fever, chills, shortness of breath, etc. between now and your scheduled surgery, please notify us at the above number.     Remember:  Do not eat after midnight the night before your surgery  You may drink clear liquids until 4:30 AM the morning of your surgery.   Clear liquids allowed are: Water, Non-Citrus Juices (without pulp), Carbonated Beverages, Clear Tea, Black Coffee Only (NO MILK, CREAM OR POWDERED CREAMER of any kind), and Gatorade.     Take these medicines the morning of surgery with A SIP OF WATER:  DULoxetine (CYMBALTA)   gabapentin (NEURONTIN)   HYDROmorphone (DILAUDID)   levothyroxine (SYNTHROID)   metoprolol succinate (TOPROL-XL)   topiramate (TOPAMAX)    May take these medicines IF NEEDED:  acetaminophen (TYLENOL)   albuterol (VENTOLIN HFA) inhaler   cyclobenzaprine (FLEXERIL)   UBRELVY    As of today, STOP taking any Aspirin (unless otherwise instructed by your surgeon) Aleve, Naproxen, Ibuprofen, Motrin, Advil, Goody's, BC's, all herbal medications, fish oil, and all vitamins. This includes your medication: Aspirin-Caffeine PACK                      Do NOT Smoke (Tobacco/Vaping) for 24 hours prior to your procedure.  If you use a CPAP at night, you may bring your mask/headgear for your overnight stay.   Contacts, glasses, piercing's, hearing aid's, dentures or partials may not be worn into surgery, please bring cases for these belongings.    For patients admitted to the hospital, discharge time will be determined by your treatment team.   Patients discharged  the day of surgery will not be allowed to drive home, and someone needs to stay with them for 24 hours.  SURGICAL WAITING ROOM VISITATION Patients having surgery or a procedure may have no more than 2 support people in the waiting area - these visitors may rotate.   Children under the age of 84 must have an adult with them who is not the patient. If the patient needs to stay at the hospital during part of their recovery, the visitor guidelines for inpatient rooms apply. Pre-op nurse will coordinate an appropriate time for 1 support person to accompany patient in pre-op.  This support person may not rotate.   Please refer to the [facility] website for the visitor guidelines for Inpatients (after your surgery is over and you are in a regular room).    Special instructions:   [facility]- Preparing For Surgery  Before surgery, you can play an important role. Because skin is not sterile, your skin needs to be as free of germs as possible. You can reduce the number of germs on your skin by washing with CHG (chlorahexidine gluconate) Soap before surgery.  CHG is an antiseptic cleaner which kills germs and bonds with the skin to continue killing germs even after washing.    Oral Hygiene is also important to reduce your risk of infection.  Remember - BRUSH YOUR TEETH THE MORNING OF SURGERY WITH YOUR REGULAR TOOTHPASTE  Please do not use if you have an allergy to  CHG or antibacterial soaps. If your skin becomes reddened/irritated stop using the CHG.  Do not shave (including legs and underarms) for at least 48 hours prior to first CHG shower. It is OK to shave your face.  Please follow these instructions carefully.   Shower the NIGHT BEFORE SURGERY and the MORNING OF SURGERY  If you chose to wash your hair, wash your hair first as usual with your normal shampoo.  After you shampoo, rinse your hair and body thoroughly to remove the shampoo.  Use CHG Soap as you would any other liquid soap. You can  apply CHG directly to the skin and wash gently with a scrungie or a clean washcloth.   Apply the CHG Soap to your body ONLY FROM THE NECK DOWN.  Do not use on open wounds or open sores. Avoid contact with your eyes, ears, mouth and genitals (private parts). Wash Face and genitals (private parts)  with your normal soap.   Wash thoroughly, paying special attention to the area where your surgery will be performed.  Thoroughly rinse your body with warm water from the neck down.  DO NOT shower/wash with your normal soap after using and rinsing off the CHG Soap.  Pat yourself dry with a CLEAN TOWEL.  Wear CLEAN PAJAMAS to bed the night before surgery  Place CLEAN SHEETS on your bed the night before your surgery  DO NOT SLEEP WITH PETS.   Day of Surgery: Take a shower with CHG soap. Do not wear jewelry or makeup Do not wear lotions, powders, perfumes/colognes, or deodorant. Do not shave 48 hours prior to surgery.  Men may shave face and neck. Do not bring valuables to the hospital.  [facility] is not responsible for any belongings or valuables. Do not wear nail polish, gel polish, artificial nails, or any other type of covering on natural nails (fingers and toes) If you have artificial nails or gel coating that need to be removed by a nail salon, please have this removed prior to surgery. Artificial nails or gel coating may interfere with anesthesia's ability to adequately monitor your vital signs.  Wear Clean/Comfortable clothing the morning of surgery Remember to brush your teeth WITH YOUR REGULAR TOOTHPASTE.   Please read over the following fact sheets that you were given.    If you received a COVID test during your pre-op visit  it is requested that you wear a mask when out in public, stay away from anyone that may not be feeling well and notify your surgeon if you develop symptoms. If you have been in contact with anyone that has tested positive in the last 10 days please notify  you surgeon.

## 2023-01-19 ENCOUNTER — Encounter (HOSPITAL_COMMUNITY)
Admission: RE | Admit: 2023-01-19 | Discharge: 2023-01-19 | Disposition: A | Discharge status: Home / Self Care | Payer: BC Managed Care – PPO | Source: Ambulatory Visit | Attending: Neurological Surgery | Admitting: Neurological Surgery

## 2023-01-19 ENCOUNTER — Encounter (HOSPITAL_COMMUNITY): Payer: Self-pay

## 2023-01-19 ENCOUNTER — Other Ambulatory Visit: Payer: Self-pay

## 2023-01-19 VITALS — BP 120/86 | HR 88 | Temp 98.4°F | Resp 18 | Ht 61.0 in | Wt 234.9 lb

## 2023-01-19 DIAGNOSIS — I4892 Unspecified atrial flutter: Secondary | ICD-10-CM | POA: Insufficient documentation

## 2023-01-19 DIAGNOSIS — Z01818 Encounter for other preprocedural examination: Secondary | ICD-10-CM

## 2023-01-19 DIAGNOSIS — Z01812 Encounter for preprocedural laboratory examination: Secondary | ICD-10-CM | POA: Insufficient documentation

## 2023-01-19 LAB — SURGICAL PCR SCREEN
MRSA, PCR: NEGATIVE
Staphylococcus aureus: POSITIVE — AB

## 2023-01-19 LAB — CBC
HCT: 37.9 % (ref 36.0–46.0)
Hemoglobin: 12.5 g/dL (ref 12.0–15.0)
MCH: 30 pg (ref 26.0–34.0)
MCHC: 33 g/dL (ref 30.0–36.0)
MCV: 91.1 fL (ref 80.0–100.0)
Platelets: 243 10*3/uL (ref 150–400)
RBC: 4.16 MIL/uL (ref 3.87–5.11)
RDW: 13.3 % (ref 11.5–15.5)
WBC: 7.4 10*3/uL (ref 4.0–10.5)
nRBC: 0 % (ref 0.0–0.2)

## 2023-01-19 LAB — BASIC METABOLIC PANEL
Anion gap: 7 (ref 5–15)
BUN: 9 mg/dL (ref 6–20)
CO2: 24 mmol/L (ref 22–32)
Calcium: 8.5 mg/dL — ABNORMAL LOW (ref 8.9–10.3)
Chloride: 106 mmol/L (ref 98–111)
Creatinine, Ser: 0.85 mg/dL (ref 0.44–1.00)
GFR, Estimated: >60 mL/min (ref >60)
Glucose, Bld: 147 mg/dL — ABNORMAL HIGH (ref 70–99)
Potassium: 3.9 mmol/L (ref 3.5–5.1)
Sodium: 137 mmol/L (ref 135–145)

## 2023-01-19 NOTE — Progress Notes (Addendum)
PCP - Dr. [name] Cardiologist - Dr. Claudina Lick  PPM/ICD - Denies  Chest x-ray - n/a EKG - 09/09/2022 Stress Test - Denies ECHO - 12/23/2022 Cardiac Cath - Denies  Sleep Study - Had a sleep study, but never diagnosed with sleep apnes CPAP - Denies  Non-diabetic Last dose of GLP1 agonist-  n/a GLP1 instructions: n/a  Blood Thinner Instructions:Denies Aspirin Instructions:Last dose 01/18/2023  ERAS Protcol - Yes, clear liquids until 3 hours prior to surgery.  PRE-SURGERY Ensure or G2- No  COVID TEST- Denies   Anesthesia review: Yes, Saw Cardiologist in Jan for f/u from ablation.  Patient has not had any problems, is not experiencing any CP, SOB, dizziness or lightheadedness.  Cardiology note does not specifically state clearance for surgery. Patient does not have an extensive medical history.   Patient denies shortness of breath, fever, cough and chest pain at PAT appointment   All instructions explained to the patient, with a verbal understanding of the material. Patient agrees to go over the instructions while at home for a better understanding. Patient also instructed to self quarantine after being tested for COVID-19. The opportunity to ask questions was provided.

## 2023-01-20 NOTE — Progress Notes (Signed)
Anesthesia Chart Review:  Follows with cardiology for history of atrial flutter s/p successful ablation 2004, PVCs (noted to have 2.6% PVC burden on event monitor 2020).  Last seen by Dr. Dellia Cloud on 11/24/2022 and reported some SOB associated palpitations in the past few months.  Echocardiogram was ordered.  Echo 12/23/2022 was normal, EF 60 to 65%, normal RV function, normal valves.  Preop Labs reviewed, unremarkable.  EKG 09/09/2022: NSR.  Rate 86.  TTE 12/23/2022: 1. Left ventricular ejection fraction, by estimation, is 60 to 65%. The  left ventricle has normal function. The left ventricle has no regional  wall motion abnormalities. Left ventricular diastolic parameters were  normal. The average left ventricular  global longitudinal strain is -23.6 %. The global longitudinal strain is  normal.   2. Right ventricular systolic function is normal. The right ventricular  size is normal.   3. The mitral valve is normal in structure. No evidence of mitral valve  regurgitation. No evidence of mitral stenosis.   4. The tricuspid valve is abnormal.   5. The aortic valve has an indeterminant number of cusps. Aortic valve  regurgitation is not visualized. No aortic stenosis is present.   6. The inferior vena cava is normal in size with greater than 50%  respiratory variability, suggesting right atrial pressure of 3 mmHg.     Wynonia Musty [facility] Short Stay Center/Anesthesiology Phone [phone] 01/20/2023 10:05 AM

## 2023-01-20 NOTE — Anesthesia Preprocedure Evaluation (Addendum)
Anesthesia Evaluation  Patient identified by MRN, date of birth, ID band Patient awake    Reviewed: Allergy & Precautions, NPO status , Patient's Chart, lab work & pertinent test results  History of Anesthesia Complications (+) PONV and history of anesthetic complications  Airway Mallampati: II  TM Distance: >3 FB Neck ROM: Full    Dental  (+) Upper Dentures, Lower Dentures   Pulmonary asthma , former smoker   Pulmonary exam normal        Cardiovascular hypertension, Pt. on medications and Pt. on home beta blockers  Rhythm:Regular Rate:Normal     Neuro/Psych  Headaches  Anxiety Depression       GI/Hepatic negative GI ROS, Neg liver ROS,,,  Endo/Other  diabetesHypothyroidism    Renal/GU negative Renal ROS  negative genitourinary   Musculoskeletal  (+) Arthritis ,    Abdominal Normal abdominal exam  (+)   Peds  Hematology  (+) Blood dyscrasia, anemia   Anesthesia Other Findings   Reproductive/Obstetrics                             Anesthesia Physical Anesthesia Plan  ASA: 2  Anesthesia Plan: General   Post-op Pain Management: Celebrex PO (pre-op)*, Tylenol PO (pre-op)* and Ketamine IV*   Induction: Intravenous  PONV Risk Score and Plan: 4 or greater and Ondansetron, Dexamethasone, Midazolam, Scopolamine patch - Pre-op and Treatment may vary due to age or medical condition  Airway Management Planned: Mask and Oral ETT  Additional Equipment: None  Intra-op Plan:   Post-operative Plan: Extubation in OR  Informed Consent: I have reviewed the patients History and Physical, chart, labs and discussed the procedure including the risks, benefits and alternatives for the proposed anesthesia with the patient or authorized representative who has indicated his/her understanding and acceptance.     Dental advisory given  Plan Discussed with: CRNA  Anesthesia Plan Comments: (PAT note  by Karoline Caldwell, PA-C: Follows with cardiology for history of atrial flutter s/p successful ablation 2004, PVCs (noted to have 2.6% PVC burden on event monitor 2020).  Last seen by Dr. Dellia Cloud on 11/24/2022 and reported some SOB associated palpitations in the past few months.  Echocardiogram was ordered.  Echo 12/23/2022 was normal, EF 60 to 65%, normal RV function, normal valves.  Preop Labs reviewed, unremarkable.  EKG 09/09/2022: NSR.  Rate 86.  TTE 12/23/2022: 1. Left ventricular ejection fraction, by estimation, is 60 to 65%. The  left ventricle has normal function. The left ventricle has no regional  wall motion abnormalities. Left ventricular diastolic parameters were  normal. The average left ventricular  global longitudinal strain is -23.6 %. The global longitudinal strain is  normal.  2. Right ventricular systolic function is normal. The right ventricular  size is normal.  3. The mitral valve is normal in structure. No evidence of mitral valve  regurgitation. No evidence of mitral stenosis.  4. The tricuspid valve is abnormal.  5. The aortic valve has an indeterminant number of cusps. Aortic valve  regurgitation is not visualized. No aortic stenosis is present.  6. The inferior vena cava is normal in size with greater than 50%  respiratory variability, suggesting right atrial pressure of 3 mmHg.   )        Anesthesia Quick Evaluation

## 2023-01-21 ENCOUNTER — Other Ambulatory Visit: Payer: Self-pay

## 2023-01-21 ENCOUNTER — Ambulatory Visit (HOSPITAL_COMMUNITY): Payer: BC Managed Care – PPO | Admitting: Physician Assistant

## 2023-01-21 ENCOUNTER — Observation Stay (HOSPITAL_COMMUNITY)
Admission: RE | Admit: 2023-01-21 | Discharge: 2023-01-22 | Disposition: A | Discharge status: Home / Self Care | Payer: BC Managed Care – PPO | Attending: Neurological Surgery | Admitting: Neurological Surgery

## 2023-01-21 ENCOUNTER — Ambulatory Visit (HOSPITAL_COMMUNITY): Payer: BC Managed Care – PPO

## 2023-01-21 ENCOUNTER — Encounter (HOSPITAL_COMMUNITY)
Admission: RE | Disposition: A | Discharge status: Home / Self Care | Payer: Self-pay | Source: Home / Self Care | Attending: Neurological Surgery

## 2023-01-21 ENCOUNTER — Encounter (HOSPITAL_COMMUNITY): Payer: Self-pay | Admitting: Neurological Surgery

## 2023-01-21 DIAGNOSIS — Z87891 Personal history of nicotine dependence: Secondary | ICD-10-CM | POA: Insufficient documentation

## 2023-01-21 DIAGNOSIS — M5117 Intervertebral disc disorders with radiculopathy, lumbosacral region: Secondary | ICD-10-CM | POA: Diagnosis not present

## 2023-01-21 DIAGNOSIS — E039 Hypothyroidism, unspecified: Secondary | ICD-10-CM | POA: Insufficient documentation

## 2023-01-21 DIAGNOSIS — Z79899 Other long term (current) drug therapy: Secondary | ICD-10-CM | POA: Diagnosis not present

## 2023-01-21 DIAGNOSIS — M5116 Intervertebral disc disorders with radiculopathy, lumbar region: Secondary | ICD-10-CM | POA: Diagnosis not present

## 2023-01-21 DIAGNOSIS — J45909 Unspecified asthma, uncomplicated: Secondary | ICD-10-CM | POA: Insufficient documentation

## 2023-01-21 DIAGNOSIS — Z0389 Encounter for observation for other suspected diseases and conditions ruled out: Secondary | ICD-10-CM | POA: Diagnosis not present

## 2023-01-21 DIAGNOSIS — M5416 Radiculopathy, lumbar region: Secondary | ICD-10-CM | POA: Diagnosis present

## 2023-01-21 HISTORY — PX: LUMBAR LAMINECTOMY/ DECOMPRESSION WITH MET-RX: SHX5959

## 2023-01-21 LAB — POCT PREGNANCY, URINE: Preg Test, Ur: NEGATIVE

## 2023-01-21 SURGERY — LUMBAR LAMINECTOMY/ DECOMPRESSION WITH MET-RX
Anesthesia: General | Site: Spine Lumbar | Laterality: Left

## 2023-01-21 MED ORDER — FENTANYL CITRATE (PF) 100 MCG/2ML IJ SOLN
25.0000 ug | INTRAMUSCULAR | Status: DC | PRN
  Administered 2023-01-21 (×3): 50 ug via INTRAVENOUS

## 2023-01-21 MED ORDER — TOPIRAMATE 100 MG PO TABS
100.0000 mg | ORAL_TABLET | Freq: Two times a day (BID) | ORAL | Status: DC
  Administered 2023-01-21 (×2): 100 mg via ORAL
  Filled 2023-01-21 (×2): qty 1

## 2023-01-21 MED ORDER — ONDANSETRON HCL 4 MG/2ML IJ SOLN
INTRAMUSCULAR | Status: AC
  Filled 2023-01-21: qty 2

## 2023-01-21 MED ORDER — THROMBIN 5000 UNITS EX SOLR
CUTANEOUS | Status: AC
  Filled 2023-01-21: qty 5000

## 2023-01-21 MED ORDER — PHENYLEPHRINE 80 MCG/ML (10ML) SYRINGE FOR IV PUSH (FOR BLOOD PRESSURE SUPPORT)
PREFILLED_SYRINGE | INTRAVENOUS | Status: DC | PRN
  Administered 2023-01-21 (×2): 160 ug via INTRAVENOUS
  Administered 2023-01-21: 80 ug via INTRAVENOUS
  Administered 2023-01-21: 40 ug via INTRAVENOUS
  Administered 2023-01-21: 160 ug via INTRAVENOUS

## 2023-01-21 MED ORDER — DULOXETINE HCL 30 MG PO CPEP
30.0000 mg | ORAL_CAPSULE | Freq: Two times a day (BID) | ORAL | Status: DC
  Administered 2023-01-21 – 2023-01-22 (×3): 30 mg via ORAL
  Filled 2023-01-21 (×3): qty 1

## 2023-01-21 MED ORDER — LIDOCAINE 2% (20 MG/ML) 5 ML SYRINGE
INTRAMUSCULAR | Status: AC
  Filled 2023-01-21: qty 5

## 2023-01-21 MED ORDER — ACETAMINOPHEN 650 MG RE SUPP: 650.0000 mg | RECTAL | Status: DC | PRN

## 2023-01-21 MED ORDER — VANCOMYCIN HCL IN DEXTROSE 1-5 GM/200ML-% IV SOLN
1000.0000 mg | INTRAVENOUS | Status: AC
  Administered 2023-01-21: 1000 mg via INTRAVENOUS
  Filled 2023-01-21: qty 200

## 2023-01-21 MED ORDER — LIDOCAINE 2% (20 MG/ML) 5 ML SYRINGE
INTRAMUSCULAR | Status: DC | PRN
  Administered 2023-01-21: 180 mg via INTRAVENOUS

## 2023-01-21 MED ORDER — SODIUM CHLORIDE 0.9% FLUSH
3.0000 mL | Freq: Two times a day (BID) | INTRAVENOUS | Status: DC
  Administered 2023-01-21: 3 mL via INTRAVENOUS

## 2023-01-21 MED ORDER — FENTANYL CITRATE (PF) 250 MCG/5ML IJ SOLN
INTRAMUSCULAR | Status: AC
  Filled 2023-01-21: qty 5

## 2023-01-21 MED ORDER — HYDROMORPHONE HCL 1 MG/ML IJ SOLN
INTRAMUSCULAR | Status: AC
  Filled 2023-01-21: qty 0.5

## 2023-01-21 MED ORDER — ORAL CARE MOUTH RINSE: 15.0000 mL | Freq: Once | OROMUCOSAL | Status: AC

## 2023-01-21 MED ORDER — DEXAMETHASONE SODIUM PHOSPHATE 10 MG/ML IJ SOLN
INTRAMUSCULAR | Status: DC | PRN
  Administered 2023-01-21: 10 mg via INTRAVENOUS

## 2023-01-21 MED ORDER — MENTHOL 3 MG MT LOZG: 1.0000 | LOZENGE | OROMUCOSAL | Status: DC | PRN

## 2023-01-21 MED ORDER — CYCLOBENZAPRINE HCL 5 MG PO TABS
5.0000 mg | ORAL_TABLET | Freq: Three times a day (TID) | ORAL | Status: DC | PRN
  Administered 2023-01-21 – 2023-01-22 (×3): 5 mg via ORAL
  Filled 2023-01-21 (×2): qty 1

## 2023-01-21 MED ORDER — FENTANYL CITRATE (PF) 250 MCG/5ML IJ SOLN
INTRAMUSCULAR | Status: DC | PRN
  Administered 2023-01-21 (×3): 50 ug via INTRAVENOUS
  Administered 2023-01-21: 100 ug via INTRAVENOUS

## 2023-01-21 MED ORDER — FENTANYL CITRATE (PF) 100 MCG/2ML IJ SOLN
INTRAMUSCULAR | Status: AC
  Filled 2023-01-21: qty 2

## 2023-01-21 MED ORDER — CHLORHEXIDINE GLUCONATE CLOTH 2 % EX PADS: 6.0000 | MEDICATED_PAD | Freq: Once | CUTANEOUS | Status: DC

## 2023-01-21 MED ORDER — HYDROMORPHONE HCL 1 MG/ML IJ SOLN
INTRAMUSCULAR | Status: DC | PRN
  Administered 2023-01-21: .5 mg via INTRAVENOUS

## 2023-01-21 MED ORDER — CYCLOBENZAPRINE HCL 10 MG PO TABS
ORAL_TABLET | ORAL | Status: AC
  Filled 2023-01-21: qty 1

## 2023-01-21 MED ORDER — DIPHENHYDRAMINE HCL 50 MG/ML IJ SOLN
INTRAMUSCULAR | Status: AC
  Filled 2023-01-21: qty 1

## 2023-01-21 MED ORDER — KETAMINE HCL 50 MG/5ML IJ SOSY
PREFILLED_SYRINGE | INTRAMUSCULAR | Status: AC
  Filled 2023-01-21: qty 5

## 2023-01-21 MED ORDER — OXYCODONE HCL 5 MG PO TABS
5.0000 mg | ORAL_TABLET | ORAL | Status: DC | PRN
  Administered 2023-01-21 – 2023-01-22 (×2): 5 mg via ORAL
  Filled 2023-01-21 (×3): qty 1

## 2023-01-21 MED ORDER — AMISULPRIDE (ANTIEMETIC) 5 MG/2ML IV SOLN: 10.0000 mg | Freq: Once | INTRAVENOUS | Status: DC | PRN

## 2023-01-21 MED ORDER — GABAPENTIN 300 MG PO CAPS
300.0000 mg | ORAL_CAPSULE | Freq: Two times a day (BID) | ORAL | Status: DC
  Administered 2023-01-21 – 2023-01-22 (×2): 300 mg via ORAL
  Filled 2023-01-21 (×2): qty 1

## 2023-01-21 MED ORDER — CHLORHEXIDINE GLUCONATE 0.12 % MT SOLN
15.0000 mL | Freq: Once | OROMUCOSAL | Status: AC
  Administered 2023-01-21: 15 mL via OROMUCOSAL
  Filled 2023-01-21: qty 15

## 2023-01-21 MED ORDER — ONDANSETRON HCL 4 MG/2ML IJ SOLN: 4.0000 mg | Freq: Four times a day (QID) | INTRAMUSCULAR | Status: DC | PRN

## 2023-01-21 MED ORDER — CELECOXIB 200 MG PO CAPS
200.0000 mg | ORAL_CAPSULE | Freq: Once | ORAL | Status: AC
  Administered 2023-01-21: 200 mg via ORAL
  Filled 2023-01-21: qty 1

## 2023-01-21 MED ORDER — SODIUM CHLORIDE 0.9 % IV SOLN
250.0000 mL | INTRAVENOUS | Status: DC
  Administered 2023-01-21: 250 mL via INTRAVENOUS

## 2023-01-21 MED ORDER — ROCURONIUM BROMIDE 10 MG/ML (PF) SYRINGE
PREFILLED_SYRINGE | INTRAVENOUS | Status: DC | PRN
  Administered 2023-01-21: 10 mg via INTRAVENOUS
  Administered 2023-01-21: 60 mg via INTRAVENOUS

## 2023-01-21 MED ORDER — METOPROLOL SUCCINATE ER 25 MG PO TB24
25.0000 mg | ORAL_TABLET | Freq: Two times a day (BID) | ORAL | Status: DC
  Administered 2023-01-21: 25 mg via ORAL
  Filled 2023-01-21 (×2): qty 1

## 2023-01-21 MED ORDER — ONDANSETRON HCL 4 MG/2ML IJ SOLN
INTRAMUSCULAR | Status: DC | PRN
  Administered 2023-01-21 (×2): 4 mg via INTRAVENOUS

## 2023-01-21 MED ORDER — LIDOCAINE-EPINEPHRINE 1 %-1:100000 IJ SOLN
INTRAMUSCULAR | Status: AC
  Filled 2023-01-21: qty 1

## 2023-01-21 MED ORDER — KETAMINE HCL 10 MG/ML IJ SOLN
INTRAMUSCULAR | Status: DC | PRN
  Administered 2023-01-21: 10 mg via INTRAVENOUS
  Administered 2023-01-21: 20 mg via INTRAVENOUS

## 2023-01-21 MED ORDER — ACETAMINOPHEN 325 MG PO TABS
650.0000 mg | ORAL_TABLET | ORAL | Status: DC | PRN
  Administered 2023-01-21 – 2023-01-22 (×2): 650 mg via ORAL
  Filled 2023-01-21 (×2): qty 2

## 2023-01-21 MED ORDER — SUGAMMADEX SODIUM 200 MG/2ML IV SOLN
INTRAVENOUS | Status: DC | PRN
  Administered 2023-01-21: 200 mg via INTRAVENOUS

## 2023-01-21 MED ORDER — HYDROMORPHONE HCL 2 MG PO TABS
2.0000 mg | ORAL_TABLET | Freq: Three times a day (TID) | ORAL | Status: DC
  Administered 2023-01-21 – 2023-01-22 (×3): 2 mg via ORAL
  Filled 2023-01-21 (×3): qty 1

## 2023-01-21 MED ORDER — SODIUM CHLORIDE 0.9% FLUSH: 3.0000 mL | INTRAVENOUS | Status: DC | PRN

## 2023-01-21 MED ORDER — ALBUTEROL SULFATE (2.5 MG/3ML) 0.083% IN NEBU: 3.0000 mL | INHALATION_SOLUTION | Freq: Four times a day (QID) | RESPIRATORY_TRACT | Status: DC | PRN

## 2023-01-21 MED ORDER — MIDAZOLAM HCL 2 MG/2ML IJ SOLN
INTRAMUSCULAR | Status: AC
  Filled 2023-01-21: qty 2

## 2023-01-21 MED ORDER — LIDOCAINE-EPINEPHRINE 1 %-1:100000 IJ SOLN
INTRAMUSCULAR | Status: DC | PRN
  Administered 2023-01-21: 10 mL

## 2023-01-21 MED ORDER — PHENOL 1.4 % MT LIQD: 1.0000 | OROMUCOSAL | Status: DC | PRN

## 2023-01-21 MED ORDER — POLYETHYLENE GLYCOL 3350 17 G PO PACK: 17.0000 g | PACK | Freq: Every day | ORAL | Status: DC | PRN

## 2023-01-21 MED ORDER — PHENYLEPHRINE HCL-NACL 20-0.9 MG/250ML-% IV SOLN
INTRAVENOUS | Status: DC | PRN
  Administered 2023-01-21: 40 ug/min via INTRAVENOUS

## 2023-01-21 MED ORDER — DEXAMETHASONE SODIUM PHOSPHATE 10 MG/ML IJ SOLN
INTRAMUSCULAR | Status: AC
  Filled 2023-01-21: qty 1

## 2023-01-21 MED ORDER — PROPOFOL 10 MG/ML IV BOLUS
INTRAVENOUS | Status: AC
  Filled 2023-01-21: qty 20

## 2023-01-21 MED ORDER — MIDAZOLAM HCL 2 MG/2ML IJ SOLN
INTRAMUSCULAR | Status: DC | PRN
  Administered 2023-01-21: 2 mg via INTRAVENOUS

## 2023-01-21 MED ORDER — LACTATED RINGERS IV SOLN: INTRAVENOUS | Status: DC

## 2023-01-21 MED ORDER — PROPOFOL 10 MG/ML IV BOLUS
INTRAVENOUS | Status: DC | PRN
  Administered 2023-01-21: 30 mg via INTRAVENOUS
  Administered 2023-01-21: 50 mg via INTRAVENOUS
  Administered 2023-01-21: 20 mg via INTRAVENOUS
  Administered 2023-01-21: 125 ug/kg/min via INTRAVENOUS

## 2023-01-21 MED ORDER — LEVOTHYROXINE SODIUM 75 MCG PO TABS
75.0000 ug | ORAL_TABLET | Freq: Every day | ORAL | Status: DC
  Administered 2023-01-22: 75 ug via ORAL
  Filled 2023-01-21: qty 1

## 2023-01-21 MED ORDER — THROMBIN 5000 UNITS EX SOLR: OROMUCOSAL | Status: DC | PRN

## 2023-01-21 MED ORDER — EPHEDRINE SULFATE-NACL 50-0.9 MG/10ML-% IV SOSY
PREFILLED_SYRINGE | INTRAVENOUS | Status: DC | PRN
  Administered 2023-01-21: 5 mg via INTRAVENOUS

## 2023-01-21 MED ORDER — DIPHENHYDRAMINE HCL 50 MG/ML IJ SOLN
INTRAMUSCULAR | Status: DC | PRN
  Administered 2023-01-21: 12.5 mg via INTRAVENOUS

## 2023-01-21 MED ORDER — ACETAMINOPHEN 500 MG PO TABS
1000.0000 mg | ORAL_TABLET | Freq: Once | ORAL | Status: AC
  Administered 2023-01-21: 1000 mg via ORAL
  Filled 2023-01-21: qty 2

## 2023-01-21 MED ORDER — HYDROMORPHONE HCL 1 MG/ML IJ SOLN
1.0000 mg | INTRAMUSCULAR | Status: DC | PRN
  Administered 2023-01-21 (×2): 1 mg via INTRAVENOUS
  Filled 2023-01-21 (×2): qty 1

## 2023-01-21 MED ORDER — DOCUSATE SODIUM 100 MG PO CAPS
100.0000 mg | ORAL_CAPSULE | Freq: Two times a day (BID) | ORAL | Status: DC
  Administered 2023-01-21 – 2023-01-22 (×3): 100 mg via ORAL
  Filled 2023-01-21 (×3): qty 1

## 2023-01-21 MED ORDER — ONDANSETRON HCL 4 MG PO TABS: 4.0000 mg | ORAL_TABLET | Freq: Four times a day (QID) | ORAL | Status: DC | PRN

## 2023-01-21 MED ORDER — SCOPOLAMINE 1 MG/3DAYS TD PT72
1.0000 | MEDICATED_PATCH | TRANSDERMAL | Status: DC
  Administered 2023-01-21: 1.5 mg via TRANSDERMAL
  Filled 2023-01-21: qty 1

## 2023-01-21 MED ORDER — SUCCINYLCHOLINE CHLORIDE 200 MG/10ML IV SOSY: PREFILLED_SYRINGE | INTRAVENOUS | Status: DC | PRN

## 2023-01-21 MED ORDER — POLYSACCHARIDE IRON COMPLEX 150 MG PO CAPS
150.0000 mg | ORAL_CAPSULE | Freq: Every day | ORAL | Status: DC
  Administered 2023-01-21 – 2023-01-22 (×2): 150 mg via ORAL
  Filled 2023-01-21 (×2): qty 1

## 2023-01-21 MED ORDER — 0.9 % SODIUM CHLORIDE (POUR BTL) OPTIME
TOPICAL | Status: DC | PRN
  Administered 2023-01-21: 1000 mL

## 2023-01-21 MED ORDER — ROCURONIUM BROMIDE 10 MG/ML (PF) SYRINGE
PREFILLED_SYRINGE | INTRAVENOUS | Status: AC
  Filled 2023-01-21: qty 10

## 2023-01-21 MED ORDER — DEXMEDETOMIDINE HCL IN NACL 80 MCG/20ML IV SOLN
INTRAVENOUS | Status: AC
  Filled 2023-01-21: qty 20

## 2023-01-21 MED ORDER — VANCOMYCIN HCL IN DEXTROSE 1-5 GM/200ML-% IV SOLN
1000.0000 mg | Freq: Once | INTRAVENOUS | Status: AC
  Administered 2023-01-21: 1000 mg via INTRAVENOUS

## 2023-01-21 SURGICAL SUPPLY — 46 items
ADH SKN CLS APL DERMABOND .7 (GAUZE/BANDAGES/DRESSINGS) ×1
BAG COUNTER SPONGE SURGICOUNT (BAG) ×1 IMPLANT
BAG SPNG CNTER NS LX DISP (BAG) ×2
BAND INSRT 18 STRL LF DISP RB (MISCELLANEOUS) ×2
BAND RUBBER #18 3X1/16 STRL (MISCELLANEOUS) ×2 IMPLANT
BLADE SURG 11 STRL SS (BLADE) ×1 IMPLANT
BUR SURGICAL HEAD PROX 3X12.5 (BURR) ×1 IMPLANT
BURR SURGICAL HEAD PROX 3X12.5 (BURR) ×1
CANISTER SUCT 3000ML PPV (MISCELLANEOUS) ×1 IMPLANT
DERMABOND ADVANCED .7 DNX12 (GAUZE/BANDAGES/DRESSINGS) ×1 IMPLANT
DRAPE C-ARM 42X72 X-RAY (DRAPES) ×2 IMPLANT
DRAPE LAPAROTOMY 100X72X124 (DRAPES) ×1 IMPLANT
DRAPE MICROSCOPE SLANT 54X150 (MISCELLANEOUS) ×1 IMPLANT
DRAPE SURG 17X23 STRL (DRAPES) ×1 IMPLANT
DURAPREP 26ML APPLICATOR (WOUND CARE) ×1 IMPLANT
ELECT BLADE 4.0 EZ CLEAN MEGAD (MISCELLANEOUS) ×1
ELECT BLADE 6.5 EXT (BLADE) ×1 IMPLANT
ELECT REM PT RETURN 9FT ADLT (ELECTROSURGICAL) ×1
ELECTRODE BLDE 4.0 EZ CLN MEGD (MISCELLANEOUS) IMPLANT
ELECTRODE REM PT RTRN 9FT ADLT (ELECTROSURGICAL) ×1 IMPLANT
GAUZE 4X4 16PLY ~~LOC~~+RFID DBL (SPONGE) IMPLANT
GLOVE BIOGEL PI IND STRL 7.5 (GLOVE) ×1 IMPLANT
GLOVE ECLIPSE 7.5 STRL STRAW (GLOVE) ×1 IMPLANT
GOWN STRL REUS W/ TWL LRG LVL3 (GOWN DISPOSABLE) ×2 IMPLANT
GOWN STRL REUS W/ TWL XL LVL3 (GOWN DISPOSABLE) IMPLANT
GOWN STRL REUS W/TWL LRG LVL3 (GOWN DISPOSABLE) ×2
GOWN STRL REUS W/TWL XL LVL3 (GOWN DISPOSABLE) ×2
HEMOSTAT POWDER KIT SURGIFOAM (HEMOSTASIS) ×1 IMPLANT
KIT BASIN OR (CUSTOM PROCEDURE TRAY) ×1 IMPLANT
KIT TURNOVER KIT B (KITS) ×1 IMPLANT
NDL SPNL 18GX3.5 QUINCKE PK (NEEDLE) ×1 IMPLANT
NEEDLE HYPO 22GX1.5 SAFETY (NEEDLE) ×1 IMPLANT
NEEDLE SPNL 18GX3.5 QUINCKE PK (NEEDLE) ×1 IMPLANT
NS IRRIG 1000ML POUR BTL (IV SOLUTION) ×1 IMPLANT
PACK LAMINECTOMY NEURO (CUSTOM PROCEDURE TRAY) ×1 IMPLANT
PAD ARMBOARD 7.5X6 YLW CONV (MISCELLANEOUS) ×3 IMPLANT
SOL ELECTROSURG ANTI STICK (MISCELLANEOUS) ×1
SOLUTION ELECTROSURG ANTI STCK (MISCELLANEOUS) ×1 IMPLANT
SPONGE T-LAP 4X18 ~~LOC~~+RFID (SPONGE) IMPLANT
SUT MNCRL AB 3-0 PS2 18 (SUTURE) IMPLANT
SUT VIC AB 0 CT1 18XCR BRD8 (SUTURE) IMPLANT
SUT VIC AB 0 CT1 8-18 (SUTURE)
SUT VIC AB 2-0 CP2 18 (SUTURE) ×1 IMPLANT
TOWEL GREEN STERILE (TOWEL DISPOSABLE) ×1 IMPLANT
TOWEL GREEN STERILE FF (TOWEL DISPOSABLE) ×1 IMPLANT
WATER STERILE IRR 1000ML POUR (IV SOLUTION) ×1 IMPLANT

## 2023-01-21 NOTE — H&P (Signed)
Surgical H&P Update  HPI: 37 y.o. with a history of left greater than right radicular pain. 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 No changes in health since they were last seen. Still having the above and wishes to proceed with surgery.  PMHx:  Past Medical History:  Diagnosis Date   Asthma    Atrial flutter ([place])    Status post RFA by Dr. 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    Iron deficiency    Iron deficiency anemia 10/02/2010   Qualifier: Diagnosis of  By: Moshe Cipro MD, Margaret     Low vitamin B12 level 07/10/2013   Overview:  Last Assessment & Plan:  Being treated through hematology   Newborn product of in vitro fertilization (IVF) pregnancy    Obesity    PONV (postoperative nausea and vomiting)    FamHx:  Family History  Problem Relation Age of Onset   Fibromyalgia Mother        Chronic pelvic pain    Hypothyroidism Mother    Obesity Mother    Heart defect Mother        Supraventricular, tachycardia /tachypalpation    Arrhythmia Mother    Breast cancer Mother 80   Hypertension Maternal Grandmother    Diabetes Maternal Grandmother    Hypertension Maternal Grandfather    Heart disease Paternal Grandmother    Hypertension Paternal Grandmother    Heart disease Paternal Grandfather    Hypertension Paternal Grandfather    SocHx:  reports that she quit smoking about 7 years ago. Her smoking use included cigarettes. She started smoking about 9 years ago. She smoked an average of .5 packs per day. She has never used smokeless tobacco. She reports that she does not drink alcohol and does not use drugs.  Physical Exam: Strength 5/5 x4 and SILTx4 except some numbness in the  dorsum of both feet  Assesment/Plan: 37 y.o. 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 Risks, benefits, and alternatives discussed and the patient would like to continue with surgery.  -OR today -3C post-op  Judith Part, MD 01/21/23 7:14 AM

## 2023-01-21 NOTE — Op Note (Signed)
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 I therefore recommended a minimally invasive L4-5 discectomy. This was discussed with the patient as well as risks, benefits, and alternatives and the patient wished to proceed with surgical treatment.    OPERATIVE DETAIL: The patient was taken to the operating room and placed on the OR table in the prone position. A formal time out was performed with two patient identifiers and confirmed the operative site. Anesthesia was induced by the anesthesia team. The operative site was marked, hair was clipped with surgical clippers, the area was then prepped and draped in a sterile fashion. Fluoroscopy was used to identify the surgical level prior to incision.   A 2cm incision was then marked 1cm off to the left of midline. The fascia was incised sharply and serial dilators were docked to the lamino-facet junction using fluoroscopy to confirm position as well as perform a second count to confirm the correct surgical level. After a final dilator was placed, a tubular retractor was placed over this and secured to the table. The operating microscope was draped and brought into the field. Anatomy was palpated and confirmed, monopolar cautery was used to expose the facet, lamina, and a portion of the spinous process to confirm orientation. A high speed drill and kerrison rongeurs were then used to create a  hemilaminotomy and small medial facetectomy. The ligamentum flavum was resected and the thecal sac and traversing nerve root were identified. The traversing nerve root was gently retracted medially to expose the disc space using a suction-retractor. An annulotomy was created sharply and large, free disc fragments that were easily removed. The annulotomy was explored and additional free fragments were removed. The traversing nerve root was palpated throughout its visible course to confirm good decompression.  Hemostasis was obtained, the wound was copiously irrigated, and the tube was removed while using the microscope to confirm hemostasis of the muscle edges. All instrument and sponge counts were correct and the incision was then closed in layers. The patient was then returned to anesthesia for emergence. No apparent complications at the completion of the procedure.    EBL:  3mL   DRAINS: none   SPECIMENS: none   Judith Part, MD 01/21/23 9:11 AM

## 2023-01-21 NOTE — Transfer of Care (Signed)
Immediate Anesthesia Transfer of Care Note  Patient: Alejandra Barr  Procedure(s) Performed: Left Lumbar Four-Five MIS Discectomy (Left: Spine Lumbar)  Patient Location: PACU  Anesthesia Type:General  Level of Consciousness: awake, alert , drowsy, and patient cooperative  Airway & Oxygen Therapy: Patient Spontanous Breathing and Patient connected to face mask oxygen  Post-op Assessment: Report given to RN  Post vital signs: Reviewed and stable  Last Vitals:  Vitals Value Taken Time  BP 125/78 01/21/23 0916  Temp    Pulse 100 01/21/23 0921  Resp 8 01/21/23 0921  SpO2 78 % 01/21/23 0921  Vitals shown include unvalidated device data.  Last Pain:  Vitals:   01/21/23 0611  TempSrc:   PainSc: 5       Patients Stated Pain Goal: 1 ([id number])  Complications: There were no known notable events for this encounter.

## 2023-01-21 NOTE — Anesthesia Postprocedure Evaluation (Signed)
Anesthesia Post Note  Patient: Alejandra Barr  Procedure(s) Performed: Left Lumbar Four-Five MIS Discectomy (Left: Spine Lumbar)     Patient location during evaluation: PACU Anesthesia Type: General Level of consciousness: awake and alert Pain management: pain level controlled Vital Signs Assessment: post-procedure vital signs reviewed and stable Respiratory status: spontaneous breathing, nonlabored ventilation, respiratory function stable and patient connected to nasal cannula oxygen Cardiovascular status: blood pressure returned to baseline and stable Postop Assessment: no apparent nausea or vomiting Anesthetic complications: no   There were no known notable events for this encounter.  Last Vitals:  Vitals:   01/21/23 0945 01/21/23 1011  BP: (!) 118/56 (!) 132/102  Pulse: 85 (!) 103  Resp: 12 19  Temp:  37.6 C  SpO2: 99% 99%    Last Pain:  Vitals:   01/21/23 1011  TempSrc: Oral  PainSc: 7                  Kacey Vicuna P Aretha Levi

## 2023-01-21 NOTE — Anesthesia Procedure Notes (Signed)
Procedure Name: Intubation Date/Time: 01/21/2023 7:36 AM  Performed by: Gwyndolyn Saxon, CRNAPre-anesthesia Checklist: Patient identified, Emergency Drugs available, Patient being monitored and Suction available Patient Re-evaluated:Patient Re-evaluated prior to induction Oxygen Delivery Method: Circle System Utilized Preoxygenation: Pre-oxygenation with 100% oxygen Induction Type: IV induction Ventilation: Mask ventilation without difficulty Laryngoscope Size: Mac and 3 Grade View: Grade I Tube type: Oral Tube size: 7.0 mm Number of attempts: 1 Airway Equipment and Method: Stylet Placement Confirmation: ETT inserted through vocal cords under direct vision, positive ETCO2 and breath sounds checked- equal and bilateral Tube secured with: Tape Dental Injury: Teeth and Oropharynx as per pre-operative assessment

## 2023-01-22 ENCOUNTER — Encounter (HOSPITAL_COMMUNITY): Payer: Self-pay | Admitting: Neurological Surgery

## 2023-01-22 DIAGNOSIS — E039 Hypothyroidism, unspecified: Secondary | ICD-10-CM | POA: Diagnosis not present

## 2023-01-22 DIAGNOSIS — Z79899 Other long term (current) drug therapy: Secondary | ICD-10-CM | POA: Diagnosis not present

## 2023-01-22 DIAGNOSIS — J45909 Unspecified asthma, uncomplicated: Secondary | ICD-10-CM | POA: Diagnosis not present

## 2023-01-22 DIAGNOSIS — M5117 Intervertebral disc disorders with radiculopathy, lumbosacral region: Secondary | ICD-10-CM | POA: Diagnosis not present

## 2023-01-22 DIAGNOSIS — Z87891 Personal history of nicotine dependence: Secondary | ICD-10-CM | POA: Diagnosis not present

## 2023-01-22 MED ORDER — OXYCODONE HCL 5 MG PO TABS: 5.0000 mg | ORAL_TABLET | ORAL | 0 refills | Status: DC | PRN

## 2023-01-22 NOTE — Progress Notes (Signed)
Neurosurgery Service Progress Note  Subjective: No acute events overnight. Back pain as expected. LE radicular pain is improved compared to pre-op.    Objective: Vitals:   01/21/23 2046 01/21/23 2316 01/22/23 0459 01/22/23 0808  BP: 109/61 111/60 112/64 (!) 110/55  Pulse: 95 87 89 73  Resp: 18 20 18 20   Temp: 99.4 F (37.4 C) 99 F (37.2 C) 99.3 F (37.4 C) 99.2 F (37.3 C)  TempSrc: Oral Oral Oral Oral  SpO2: 98% 98% 97% 98%  Weight:      Height:        Physical Exam: Strength 5/5 x4 and SILTx4, incision c/d/I   Assessment & Plan: 37 y.o. female s/p eft minimally invasive L4-5 discectomy, recovering well.  -PT/OT -discharge home today   Norm Parcel, PA-C 01/22/23 8:57 AM

## 2023-01-22 NOTE — Discharge Summary (Signed)
Discharge Summary  Date of Admission: 01/21/2023  Date of Discharge: 01/22/23  Attending Physician: Emelda Brothers, MD  Hospital Course: Patient was admitted following an uncomplicated left minimally invasive L4-5 discectomy . They were recovered in PACU and transferred to [facility]. Their preop symptoms were improved, their hospital course was uncomplicated and the patient was discharged home today. They will follow up in clinic with me in clinic in 2 weeks.  Neurologic exam at discharge:  Strength 5/5 x4 and SILTx4, incision c/d/I   Discharge diagnosis: HNP with lumbar radiculopathy   Collene Schlichter, PA-C 01/22/23 8:58 AM

## 2023-01-22 NOTE — Progress Notes (Signed)
OT Screen Note  Patient Details Name: Alejandra Barr MRN: [id number] DOB: 1985-11-30   Cancelled Treatment:    Reason Eval/Treat Not Completed: OT screened, no needs identified, will sign off All education provided by PT during evaluation. Pt reports that she is aware of precautions. Has family that will assist her when she goes home. Has all recommended DME such as BSC which she can use in the shower to bath or over the toilet to elevate surface if needed. Able to complete functional mobility with SBA with RW. No further questions for OT from patient or family. No further acute OT needs identified.   Ailene Ravel, OTR/L,CBIS  Supplemental OT - [facility] and [facility] Secure Chat Preferred   01/22/2023, 9:56 AM

## 2023-01-22 NOTE — TOC Transition Note (Signed)
Transition of Care [facility]) - CM/SW Discharge Note   Patient Details  Name: Alejandra Barr MRN: [id number] Date of Birth: 06-02-1986  Transition of Care [facility]) CM/SW Contact:  Levonne Lapping, RN Phone Number: 01/22/2023, 10:00 AM   Clinical Narrative:     CM Acknowledges "Consult to [facility] " for [facility] /DME needs  Patient will DC to Home with Family.  Patient has all DME in the home although there are no recommendations for any.  Patient is insured through [employer] and does have a PCP established.  No additional TOC needs  Patient will follow up as directed on AVS            Patient Goals and CMS Choice      Discharge Placement                         Discharge Plan and Services Additional resources added to the After Visit Summary for                                       Social Determinants of Health (SDOH) Interventions SDOH Screenings   Food Insecurity: No Food Insecurity (09/14/2022)  Housing: Low Risk  (09/14/2022)  Transportation Needs: No Transportation Needs (09/14/2022)  Utilities: Not At Risk (09/14/2022)  Alcohol Screen: Low Risk  (02/11/2021)  Depression (PHQ2-9): Low Risk  (08/14/2022)  Recent Concern: Depression (PHQ2-9) - High Risk (06/26/2022)  Financial Resource Strain: Low Risk  (02/11/2021)  Physical Activity: Inactive (02/11/2021)  Social Connections: Moderately Integrated (02/11/2021)  Stress: No Stress Concern Present (02/11/2021)  Tobacco Use: Medium Risk (01/22/2023)     Readmission Risk Interventions     No data to display

## 2023-01-22 NOTE — Progress Notes (Signed)
Patient alert and oriented, void, ambulate, surgical site clean and dry. D/c instructions explain and given. Will d/c patient home per order.

## 2023-01-22 NOTE — Evaluation (Signed)
Physical Therapy Evaluation Patient Details Name: Alejandra Barr MRN: [id number] DOB: Mar 21, 1986 Today's Date: 01/22/2023  History of Present Illness  Patient is a 37 y/o female who presents on 3/21 for Left L4-5 MIS Discectomy. PMH includes chronic anxiety, depression, DM, asthma, A-flutter, previous back surgery.  Clinical Impression  Patient presents with pain and post surgical deficits s/p above surgery. Pt lives at home with her spouse and 4 young children with frequent help from her parents for IADLs. Today, pt tolerated bed mobility, transfers and ambulation supervision-Mod I for safety with use of RW. Limited by shooting pain down LLE and impaired sensation. Education re: back precautions, log roll technique, positioning, movement, walking etc. Pt will have support from family at home. Does not require further skilled therapy services. All education completed. Discharge from therapy.       Recommendations for follow up therapy are one component of a multi-disciplinary discharge planning process, led by the attending physician.  Recommendations may be updated based on patient status, additional functional criteria and insurance authorization.  Follow Up Recommendations No PT follow up      Assistance Recommended at Discharge PRN  Patient can return home with the following  Assistance with cooking/housework;Assist for transportation;Help with stairs or ramp for entrance    Equipment Recommendations None recommended by PT  Recommendations for Other Services       Functional Status Assessment Patient has had a recent decline in their functional status and demonstrates the ability to make significant improvements in function in a reasonable and predictable amount of time.     Precautions / Restrictions Precautions Precautions: Back Precaution Booklet Issued: Yes (comment) Precaution Comments: Reviewed precautions Restrictions Weight Bearing Restrictions: No      Mobility   Bed Mobility Overal bed mobility: Needs Assistance Bed Mobility: Rolling, Sidelying to Sit, Sit to Sidelying Rolling: Modified independent (Device/Increase time) Sidelying to sit: Modified independent (Device/Increase time)     Sit to sidelying: Modified independent (Device/Increase time) General bed mobility comments: HOB flat and no rails to simulate home, good demo of log roll technique.    Transfers Overall transfer level: Modified independent                 General transfer comment: Stood from EOB x1, from toilet x1 without difficulty, guarded movements.    Ambulation/Gait Ambulation/Gait assistance: Supervision Gait Distance (Feet): 200 Feet Assistive device: Rolling walker (2 wheels) Gait Pattern/deviations: Step-through pattern, Decreased stride length Gait velocity: decreased Gait velocity interpretation: <1.31 ft/sec, indicative of household ambulator   General Gait Details: Slow, guarded and steady gait with use of RW, 1 standing rest break due to pain down LLE.  Stairs            Wheelchair Mobility    Modified Rankin (Stroke Patients Only)       Balance Overall balance assessment: No apparent balance deficits (not formally assessed)                                           Pertinent Vitals/Pain Pain Assessment Pain Assessment: 0-10 Pain Score: 8  Pain Location: back, shooting pain LLE Pain Descriptors / Indicators: Operative site guarding, Shooting, Discomfort, Guarding, Grimacing Pain Intervention(s): Monitored during session, Repositioned, Limited activity within patient's tolerance, Premedicated before session    Home Living Family/patient expects to be discharged to:: Private residence Living Arrangements: Spouse/significant other;Children;Parent Available Help at  Discharge: Family;Available 24 hours/day Type of Home: House Home Access: Ramped entrance       Home Layout: Two level;Able to live on main level  with bedroom/bathroom;Full bath on main level Home Equipment: Rolling Walker (2 wheels);BSC/3in1 Additional Comments: has 4 small children - 66yo, twin 3 yr and 7 mo daughter. spouse is stay at home dad so can give help. they have 72 yo dog that requires (A) and spouse can manage    Prior Function Prior Level of Function : Independent/Modified Independent;Working/employed             Mobility Comments: Moving around a lot at home due to not being able to get comfortable. Working from bed on labtop, assist for [place] from mother and spouse ADLs Comments: independent, some help needed more recently due to pain     Hand Dominance   Dominant Hand: Right    Extremity/Trunk Assessment   Upper Extremity Assessment Upper Extremity Assessment: Defer to OT evaluation    Lower Extremity Assessment Lower Extremity Assessment: LLE deficits/detail LLE Deficits / Details: Shooting pain into LLE, decreased sensation, pins/needles into foot LLE Sensation: decreased light touch    Cervical / Trunk Assessment Cervical / Trunk Assessment: Back Surgery  Communication   Communication: No difficulties  Cognition Arousal/Alertness: Awake/alert Behavior During Therapy: WFL for tasks assessed/performed Overall Cognitive Status: Within Functional Limits for tasks assessed                                          General Comments General comments (skin integrity, edema, etc.): Mother present during session. bandage- clean, dry and intact.    Exercises     Assessment/Plan    PT Assessment Patient does not need any further PT services  PT Problem List         PT Treatment Interventions      PT Goals (Current goals can be found in the Care Plan section)  Acute Rehab PT Goals Patient Stated Goal: to get back to caring for my children, decrease pain PT Goal Formulation: All assessment and education complete, DC therapy    Frequency       Co-evaluation                AM-PAC PT "6 Clicks" Mobility  Outcome Measure Help needed turning from your back to your side while in a flat bed without using bedrails?: None Help needed moving from lying on your back to sitting on the side of a flat bed without using bedrails?: None Help needed moving to and from a bed to a chair (including a wheelchair)?: A Little Help needed standing up from a chair using your arms (e.g., wheelchair or bedside chair)?: A Little Help needed to walk in hospital room?: A Little Help needed climbing 3-5 steps with a railing? : A Little 6 Click Score: 20    End of Session   Activity Tolerance: Patient limited by pain;Patient tolerated treatment well Patient left: in bed;with call bell/phone within reach Nurse Communication: Mobility status PT Visit Diagnosis: Pain;Difficulty in walking, not elsewhere classified (R26.2) Pain - Right/Left: Left Pain - part of body: Leg (back)    Time: [id number] PT Time Calculation (min) (ACUTE ONLY): 24 min   Charges:   PT Evaluation $PT Eval Low Complexity: 1 Low PT Treatments $Gait Training: 8-22 mins        Marisa Severin, PT, DPT  Acute Rehabilitation Services Secure chat preferred Office [place] 01/22/2023, 9:51 AM

## 2023-02-10 DIAGNOSIS — G894 Chronic pain syndrome: Secondary | ICD-10-CM | POA: Diagnosis not present

## 2023-02-10 DIAGNOSIS — M545 Low back pain, unspecified: Secondary | ICD-10-CM | POA: Diagnosis not present

## 2023-02-10 DIAGNOSIS — G8929 Other chronic pain: Secondary | ICD-10-CM | POA: Diagnosis not present

## 2023-02-10 DIAGNOSIS — M542 Cervicalgia: Secondary | ICD-10-CM | POA: Diagnosis not present

## 2023-02-10 DIAGNOSIS — Z79899 Other long term (current) drug therapy: Secondary | ICD-10-CM | POA: Diagnosis not present

## 2023-02-11 ENCOUNTER — Ambulatory Visit: Payer: BC Managed Care – PPO | Admitting: Family Medicine

## 2023-02-24 DIAGNOSIS — M5451 Vertebrogenic low back pain: Secondary | ICD-10-CM | POA: Diagnosis not present

## 2023-02-24 DIAGNOSIS — M546 Pain in thoracic spine: Secondary | ICD-10-CM | POA: Diagnosis not present

## 2023-03-01 DIAGNOSIS — M546 Pain in thoracic spine: Secondary | ICD-10-CM | POA: Diagnosis not present

## 2023-03-01 DIAGNOSIS — M5451 Vertebrogenic low back pain: Secondary | ICD-10-CM | POA: Diagnosis not present

## 2023-03-04 ENCOUNTER — Encounter: Payer: Self-pay | Admitting: Family Medicine

## 2023-03-04 ENCOUNTER — Ambulatory Visit (INDEPENDENT_AMBULATORY_CARE_PROVIDER_SITE_OTHER): Payer: BC Managed Care – PPO | Admitting: Family Medicine

## 2023-03-04 VITALS — BP 125/80 | HR 83 | Resp 16 | Ht 61.5 in | Wt 230.8 lb

## 2023-03-04 DIAGNOSIS — G43E01 Chronic migraine with aura, not intractable, with status migrainosus: Secondary | ICD-10-CM

## 2023-03-04 DIAGNOSIS — G43E11 Chronic migraine with aura, intractable, with status migrainosus: Secondary | ICD-10-CM | POA: Diagnosis not present

## 2023-03-04 DIAGNOSIS — M5416 Radiculopathy, lumbar region: Secondary | ICD-10-CM

## 2023-03-04 MED ORDER — KETOROLAC TROMETHAMINE 60 MG/2ML IM SOLN
60.0000 mg | Freq: Once | INTRAMUSCULAR | Status: AC
  Administered 2023-03-04: 60 mg via INTRAMUSCULAR

## 2023-03-04 MED ORDER — METHYLPREDNISOLONE ACETATE 80 MG/ML IJ SUSP
80.0000 mg | Freq: Once | INTRAMUSCULAR | Status: AC
  Administered 2023-03-04: 80 mg via INTRAMUSCULAR

## 2023-03-04 MED ORDER — SEMAGLUTIDE-WEIGHT MANAGEMENT 0.25 MG/0.5ML ~~LOC~~ SOAJ: 0.2500 mg | SUBCUTANEOUS | 0 refills | Status: DC

## 2023-03-04 NOTE — Patient Instructions (Signed)
F/U in 8 weeks , re evaluate eight , call if you need me sooner   You are referred to Neurology re migraine headaches  New for weight loss is wegovy, IF you can tolerate the medication, stop phentermine Toradol 60 mg IM and depo medrol 80 Mg IM in office today for back pain  PLEASE stop BC powder , high risk of bleeding ulcer  Thanks for choosing [facility], we consider it a privelige to serve you.

## 2023-03-04 NOTE — Progress Notes (Signed)
Alejandra Barr     MRN: [id number]      DOB: 1985-12-31  Chief Complaint  Patient presents with   Back Pain    Wants to get shots for her back pain    Obesity    Wants to discuss if wegovy may be an option for her instead of the phentermine     HPI [name] is here for follow up and re-evaluation of chronic medical conditions, medication management and review of any available recent lab and radiology data.  Preventive health is updated, specifically  Cancer screening and Immunization.   Daily headaches x 6 weeks, aura  blurry vision and nausea Currently on topamax, needs Neurology referral Back pain following 2nd surgery is daily rated at 5, and still limits her ability to play and interact as she would like to with her young children, wants relief Very concerned about ongoing weight gain despite phentermine and wants an injectable if not contra indicated, has had bariatric surgery in the past and does not want to have a revision, wants to avoid surgery if at all possible, ecognizes herl imitations because of lack of mobility/ ability to exercise ROS Denies recent fever or chills. Denies sinus pressure, nasal congestion, ear pain or sore throat. Denies chest congestion, productive cough or wheezing. Denies chest pains, palpitations and leg swelling Denies abdominal pain, nausea, vomiting,diarrhea or constipation.   Denies dysuria, frequency, hesitancy or incontinence. . Denies depression, anxiety or insomnia. Denies skin break down or rash.   PE  BP 125/80   Pulse 83   Resp 16   Ht 5' 1.5" (1.562 m)   Wt 230 lb 12.8 oz (104.7 kg)   SpO2 94%   BMI 42.90 kg/m   Patient alert and oriented and in no cardiopulmonary distress.Pt In pain  HEENT: No facial asymmetry, EOMI,     Neck supple .  Chest: Clear to auscultation bilaterally.  CVS: S1, S2 Regular rate.  ABD: Soft non tender.   Ext: No edema  MS: decreased  ROM thoracolumbar spine, adequate in  shoulders,  and  knees.  Skin: Intact, no ulcerations or rash noted.Incision site well healed  Psych: Good eye contact, normal affect. Memory intact not anxious or depressed appearing.  CNS: CN 2-12 intact, power,  normal throughout.no focal deficits noted.   Assessment & Plan  Chronic migraine with aura, intractable, with status migrainosus 6 week h/o daily headaches , associated with aura, has benefited in the past from Botox, needs Neurology asap, has also had response to Ubrelvy  Lumbar back pain with radiculopathy affecting right lower extremity Unontrolled chronic pain following 2 recent surgeries Uncontrolled.Toradol and depo medrol administered IM in the office   Morbid obesity (HCC) Deteriorated despite phentermin, has had bariatric surgery  Patient re-educated about  the importance of commitment to a  minimum of 150 minutes of exercise per week as able.  The importance of healthy food choices with portion control discussed, as well as eating regularly and within a 12 hour window most days. The need to choose "clean , green" food 50 to 75% of the time is discussed, as well as to make water the primary drink and set a goal of 64 ounces water daily.       03/04/2023    3:41 PM 01/21/2023    6:05 AM 01/19/2023    2:52 PM  Weight /BMI  Weight 230 lb 12.8 oz 226 lb 234 lb 14.4 oz  Height 5' 1.5" (1.562 m) 5'  1" (1.549 m) 5\' 1"  (1.549 m)  BMI 42.9 kg/m2 42.7 kg/m2 44.38 kg/m2    Stop phentermine and wegovy is prescribed

## 2023-03-05 ENCOUNTER — Encounter: Payer: Self-pay | Admitting: Family Medicine

## 2023-03-07 ENCOUNTER — Encounter: Payer: Self-pay | Admitting: Family Medicine

## 2023-03-07 DIAGNOSIS — G43E11 Chronic migraine with aura, intractable, with status migrainosus: Secondary | ICD-10-CM | POA: Insufficient documentation

## 2023-03-07 NOTE — Assessment & Plan Note (Addendum)
Unontrolled chronic pain following 2 recent surgeries Uncontrolled.Toradol and depo medrol administered IM in the office

## 2023-03-07 NOTE — Assessment & Plan Note (Signed)
6 week h/o daily headaches , associated with aura, has benefited in the past from Botox, needs Neurology asap, has also had response to [place]

## 2023-03-07 NOTE — Assessment & Plan Note (Signed)
Deteriorated despite phentermin, has had bariatric surgery  Patient re-educated about  the importance of commitment to a  minimum of 150 minutes of exercise per week as able.  The importance of healthy food choices with portion control discussed, as well as eating regularly and within a 12 hour window most days. The need to choose "clean , green" food 50 to 75% of the time is discussed, as well as to make water the primary drink and set a goal of 64 ounces water daily.       03/04/2023    3:41 PM 01/21/2023    6:05 AM 01/19/2023    2:52 PM  Weight /BMI  Weight 230 lb 12.8 oz 226 lb 234 lb 14.4 oz  Height 5' 1.5" (1.562 m) 5\' 1"  (1.549 m) 5\' 1"  (1.549 m)  BMI 42.9 kg/m2 42.7 kg/m2 44.38 kg/m2    Stop phentermine and wegovy is prescribed

## 2023-03-08 DIAGNOSIS — M5451 Vertebrogenic low back pain: Secondary | ICD-10-CM | POA: Diagnosis not present

## 2023-03-08 DIAGNOSIS — M546 Pain in thoracic spine: Secondary | ICD-10-CM | POA: Diagnosis not present

## 2023-03-09 ENCOUNTER — Encounter: Payer: Self-pay | Admitting: Family Medicine

## 2023-03-09 MED ORDER — SEMAGLUTIDE-WEIGHT MANAGEMENT 1 MG/0.5ML ~~LOC~~ SOAJ: 1.0000 mg | SUBCUTANEOUS | Status: DC

## 2023-03-09 MED ORDER — SEMAGLUTIDE-WEIGHT MANAGEMENT 0.5 MG/0.5ML ~~LOC~~ SOAJ: 0.5000 mg | SUBCUTANEOUS | 0 refills | Status: DC

## 2023-03-10 ENCOUNTER — Encounter: Payer: Self-pay | Admitting: Neurology

## 2023-03-10 ENCOUNTER — Encounter: Payer: Self-pay | Admitting: Family Medicine

## 2023-03-10 DIAGNOSIS — M546 Pain in thoracic spine: Secondary | ICD-10-CM | POA: Diagnosis not present

## 2023-03-10 DIAGNOSIS — G8929 Other chronic pain: Secondary | ICD-10-CM | POA: Diagnosis not present

## 2023-03-10 DIAGNOSIS — G894 Chronic pain syndrome: Secondary | ICD-10-CM | POA: Diagnosis not present

## 2023-03-10 DIAGNOSIS — M545 Low back pain, unspecified: Secondary | ICD-10-CM | POA: Diagnosis not present

## 2023-03-10 DIAGNOSIS — E668 Other obesity: Secondary | ICD-10-CM | POA: Diagnosis not present

## 2023-03-12 DIAGNOSIS — M5451 Vertebrogenic low back pain: Secondary | ICD-10-CM | POA: Diagnosis not present

## 2023-03-12 DIAGNOSIS — M546 Pain in thoracic spine: Secondary | ICD-10-CM | POA: Diagnosis not present

## 2023-03-15 DIAGNOSIS — M5451 Vertebrogenic low back pain: Secondary | ICD-10-CM | POA: Diagnosis not present

## 2023-03-15 DIAGNOSIS — M546 Pain in thoracic spine: Secondary | ICD-10-CM | POA: Diagnosis not present

## 2023-03-16 DIAGNOSIS — F330 Major depressive disorder, recurrent, mild: Secondary | ICD-10-CM | POA: Diagnosis not present

## 2023-03-17 ENCOUNTER — Inpatient Hospital Stay: Payer: BC Managed Care – PPO | Attending: Hematology

## 2023-03-17 DIAGNOSIS — Z9884 Bariatric surgery status: Secondary | ICD-10-CM | POA: Insufficient documentation

## 2023-03-17 DIAGNOSIS — R7989 Other specified abnormal findings of blood chemistry: Secondary | ICD-10-CM

## 2023-03-17 DIAGNOSIS — D500 Iron deficiency anemia secondary to blood loss (chronic): Secondary | ICD-10-CM

## 2023-03-17 DIAGNOSIS — M5451 Vertebrogenic low back pain: Secondary | ICD-10-CM | POA: Diagnosis not present

## 2023-03-17 DIAGNOSIS — M546 Pain in thoracic spine: Secondary | ICD-10-CM | POA: Diagnosis not present

## 2023-03-17 DIAGNOSIS — E538 Deficiency of other specified B group vitamins: Secondary | ICD-10-CM | POA: Diagnosis not present

## 2023-03-17 DIAGNOSIS — E559 Vitamin D deficiency, unspecified: Secondary | ICD-10-CM

## 2023-03-17 DIAGNOSIS — N920 Excessive and frequent menstruation with regular cycle: Secondary | ICD-10-CM | POA: Diagnosis not present

## 2023-03-17 DIAGNOSIS — K909 Intestinal malabsorption, unspecified: Secondary | ICD-10-CM | POA: Insufficient documentation

## 2023-03-17 DIAGNOSIS — D508 Other iron deficiency anemias: Secondary | ICD-10-CM

## 2023-03-17 LAB — CBC WITH DIFFERENTIAL/PLATELET
Abs Immature Granulocytes: 0.07 10*3/uL (ref 0.00–0.07)
Basophils Absolute: 0.1 10*3/uL (ref 0.0–0.1)
Basophils Relative: 1 %
Eosinophils Absolute: 0.8 10*3/uL — ABNORMAL HIGH (ref 0.0–0.5)
Eosinophils Relative: 8 %
HCT: 42.7 % (ref 36.0–46.0)
Hemoglobin: 13.8 g/dL (ref 12.0–15.0)
Immature Granulocytes: 1 %
Lymphocytes Relative: 20 %
Lymphs Abs: 2.1 10*3/uL (ref 0.7–4.0)
MCH: 28.8 pg (ref 26.0–34.0)
MCHC: 32.3 g/dL (ref 30.0–36.0)
MCV: 89 fL (ref 80.0–100.0)
Monocytes Absolute: 0.6 10*3/uL (ref 0.1–1.0)
Monocytes Relative: 6 %
Neutro Abs: 6.9 10*3/uL (ref 1.7–7.7)
Neutrophils Relative %: 64 %
Platelets: 291 10*3/uL (ref 150–400)
RBC: 4.8 MIL/uL (ref 3.87–5.11)
RDW: 13.2 % (ref 11.5–15.5)
WBC: 10.6 10*3/uL — ABNORMAL HIGH (ref 4.0–10.5)
nRBC: 0 % (ref 0.0–0.2)

## 2023-03-17 LAB — IRON AND TIBC
Iron: 72 ug/dL (ref 28–170)
Saturation Ratios: 18 % (ref 10.4–31.8)
TIBC: 393 ug/dL (ref 250–450)
UIBC: 321 ug/dL

## 2023-03-17 LAB — FOLATE: Folate: 9 ng/mL (ref >5.9)

## 2023-03-17 LAB — FERRITIN: Ferritin: 71 ng/mL (ref 11–307)

## 2023-03-17 LAB — VITAMIN B12: Vitamin B-12: 326 pg/mL (ref 180–914)

## 2023-03-19 LAB — VITAMIN D 25 HYDROXY (VIT D DEFICIENCY, FRACTURES)

## 2023-03-21 LAB — METHYLMALONIC ACID, SERUM: Methylmalonic Acid, Quantitative: 206 nmol/L (ref 0–378)

## 2023-03-22 DIAGNOSIS — M5451 Vertebrogenic low back pain: Secondary | ICD-10-CM | POA: Diagnosis not present

## 2023-03-22 DIAGNOSIS — M546 Pain in thoracic spine: Secondary | ICD-10-CM | POA: Diagnosis not present

## 2023-03-23 NOTE — Progress Notes (Unsigned)
VIRTUAL VISIT via TELEPHONE NOTE [facility]   I connected with Alejandra Barr  on 03/24/23 at 2:43 PM by telephone and verified that I am speaking with the correct person using two identifiers.  Location: Patient: Home Provider: [facility]   I discussed the limitations, risks, security and privacy concerns of performing an evaluation and management service by telephone and the availability of in person appointments. I also discussed with the patient that there may be a patient responsible charge related to this service. The patient expressed understanding and agreed to proceed.  REASON FOR VISIT: Iron deficiency anemia and B12 deficiency  CURRENT THERAPY: Intermittent IV iron  INTERVAL HISTORY:  Alejandra Barr is contacted today for follow-up of her iron deficiency anemia and B12 deficiency.  She was evaluated via telephone visit with Rojelio Brenner PA-C on 12/21/2022.  Since her last visit, she had minimally invasive L4-5 discectomy on 01/21/2023.  She denies any other changes in her health.    At today's visit, she reports feeling "run down" with some increased fatigue.  She continues to have menorrhagia every 2-3 weeks after giving birth to her daughter in April 2022.  She denies bright red blood per rectum or melena.  She denies any pica, restless legs, chest pain, dyspnea on exertion, lightheadedness, or syncope.   She has chronic migraine headaches.  She is continuing to take her daily iron supplements, weekly folic acid supplement, and twice weekly B12 injections.  She stopped taking her vitamin D about 6 months ago.  She has 50% energy and 100% appetite. She endorses that she is maintaining a stable weight.  REVIEW OF SYSTEMS:   Review of Systems  Constitutional:  Positive for malaise/fatigue. Negative for chills, diaphoresis, fever and weight loss.  Respiratory:  Negative for cough and shortness of breath.   Cardiovascular:  Positive for  palpitations (occassional). Negative for chest pain.  Gastrointestinal:  Negative for abdominal pain, blood in stool, melena, nausea and vomiting.  Musculoskeletal:  Positive for back pain.  Neurological:  Positive for tingling and headaches (migraines). Negative for dizziness.  Psychiatric/Behavioral:  The patient has insomnia.      PHYSICAL EXAM: (per limitations of virtual telephone visit)  The patient is alert and oriented x 3, exhibiting adequate mentation, good mood, and ability to speak in full sentences and execute sound judgement.  ASSESSMENT & PLAN:  1.  Iron deficiency anemia: - History of gastric bypass surgery -- Venofer 300 mg x 3 from 07/03/2022 through 08/03/2022 - Iron deficiency anemia is caused by menorrhagia and malabsorption  - Symptoms improved after IV iron initially improved, but she has recurrent fatigue - Most recent labs (03/17/2023): Hgb 13.8/MCV 89.0, ferritin 71, iron saturation 18% - Currently taking prenatal vitamin and ferrous sulfate 325 mg daily. - PLAN:  IV Venofer 400 mg x 1 due to symptomatic iron deficiency - Continue oral iron tablet once daily. - RTC in 3 months with repeat labs. (Patient prefers follow-up every 3 months due to sudden drops in her iron).  Prefers phone visits, but she is agreeable to annual in person visit (next due August 2024)   2.  Vitamin B12 deficiency: - Currently taking B12 injection twice weekly as well as sublingual B12 daily - Most recent B12 (03/17/2023) level improved at 326, MMA normal - PLAN: Continue B12 injections to TWICE WEEKLY.   Recheck B12/methylmalonic acid in 6 months (November 2024)   3.  Vitamin D deficiency: - Most  recent vitamin D level (03/17/2023) low at 18.6  - Was previously taking vitamin D 50,000 units weekly, but stopped 6 months ago - PLAN: Restart vitamin D once a week (refill sent to pharmacy).  Recheck vitamin D in 3 months (August 2024)   4.  Folic acid deficiency: - Folate checked on 03/17/2023  normal at 9.0 - Currently taking folic acid 1 mg daily     - PLAN: Continue folic acid supplement.  Recheck folic acid in 6 months (November 2024)   PLAN SUMMARY: >> Prescription sent to pharmacy for vitamin D refill >> Venofer 400 mg x 1 dose  >> Labs in 3 months (CBC/D, ferritin, iron/TIBC, vitamin D) >> OFFICE visit in 3 months (1 week after labs)     I discussed the assessment and treatment plan with the patient. The patient was provided an opportunity to ask questions and all were answered. The patient agreed with the plan and demonstrated an understanding of the instructions.   The patient was advised to call back or seek an in-person evaluation if the symptoms worsen or if the condition fails to improve as anticipated.  I provided 22 minutes of non-face-to-face time during this encounter.  Carnella Guadalajara, PA-C 03/24/23 2:57 PM

## 2023-03-24 ENCOUNTER — Inpatient Hospital Stay (HOSPITAL_BASED_OUTPATIENT_CLINIC_OR_DEPARTMENT_OTHER): Payer: BC Managed Care – PPO | Admitting: Physician Assistant

## 2023-03-24 ENCOUNTER — Encounter: Payer: Self-pay | Admitting: Physician Assistant

## 2023-03-24 ENCOUNTER — Other Ambulatory Visit: Payer: Self-pay

## 2023-03-24 DIAGNOSIS — D500 Iron deficiency anemia secondary to blood loss (chronic): Secondary | ICD-10-CM | POA: Diagnosis not present

## 2023-03-24 DIAGNOSIS — E538 Deficiency of other specified B group vitamins: Secondary | ICD-10-CM | POA: Diagnosis not present

## 2023-03-24 DIAGNOSIS — E559 Vitamin D deficiency, unspecified: Secondary | ICD-10-CM

## 2023-03-24 DIAGNOSIS — R7989 Other specified abnormal findings of blood chemistry: Secondary | ICD-10-CM

## 2023-03-24 MED ORDER — VITAMIN D (ERGOCALCIFEROL) 1.25 MG (50000 UNIT) PO CAPS: 50000.0000 [IU] | ORAL_CAPSULE | ORAL | 11 refills | Status: DC

## 2023-03-30 NOTE — Progress Notes (Unsigned)
NEUROLOGY CONSULTATION NOTE  Alejandra Barr MRN: [id number] DOB: 1986/03/12  Referring provider: [name], MD Primary care provider: [name], MD  Reason for consult:  migraine  Assessment/Plan:   Chronic migraine without aura, without status migrainosus, not intractable Postlaminectomy syndrome   Migraine prevention:  Restart Botox.  She did very well on Botox.  She now has over 15 headache days a month for last several months since off of Botox.  Failed topiramate, Depakote, amitriptyline, nortriptyline, venlafaxine, propranolol Migraine rescue:  advised that she may use Ubrelvy.  Zofran for nausea.  Discontinue BC powder. Limit use of pain relievers to no more than 2 days out of week to prevent risk of rebound or medication-overuse headache. Keep headache diary Follow up for Botox.    Subjective:  Alejandra Barr is a 37 year old female with hypothyroidism, asthma, depression, anxiety and history of a flutter s/p RFA (2004) who presents for migraines.  History supplemented by referring provider's note.  Onset:  9-73 years old.  Worse when she turned 29.  Location:  usually right hemicrania Quality:  pounding, throbbing, sharp Intensity:  severe.   Aura:  absent Prodrome:  absent Associated symptoms:  phonophobia, photophobia, nausea, vomiting.  She denies associated unilateral numbness or weakness. Duration:  4-5 hours, 3 hours with Bernita Raisin Frequency:  Prior to Botox: daily; On Botox: 1 to 2 days a week Triggers:  menses/hormonal Relieving factors:  rest in dark and quiet room Activity:  aggravates Frequency of analgesics:  BC powder 3 times daily (she is avoiding to take Bernita Raisin because she is unsure how it interacts with her pain medications)  She missed her last two doses of Botox because her previous neurologist retired.  Last Botox was in Botox.  Migraines are daily again.  Eye exams unremarkable  Remote MRI of brain on 05/23/2006 personally  reviewed was limited by motion but overall unremarkable.    History of chronic neck and back pain radiating into both legs.  MRI of cervical spine on 07/01/2021 personally reviewed was unremarkable.  Found to have a a meningioma in the caudal thoracic spine.  Underwent T11 laminectomy and tumor resection in November 2023.  Continued to have left leg pain and numbness.  MRI of lumbar spine revealed small left subarticular disc protgrusion at L4-5 causing narrowing in the left lateral recess .  Underwent minimally invasive L4-5 discectomy in March 2024.    Past NSAIDS/analgesics:  Fioricet, ibuprofen Past abortive triptans:  rizatriptan, sumatriptan tab Past abortive ergotamine:  none Past muscle relaxants:  none Past anti-emetic:  Reglan Past antihypertensive medications:  propranolol Past antidepressant medications:  Nortriptyline, amitriptyline, venlafaxine, Wellbutrin Past anticonvulsant medications:  Depakote Past anti-CGRP:  none Past vitamins/Herbal/Supplements:  CoQ10, magnesium oxide Past antihistamines/decongestants:  none Other past therapies:  Botox (effective), trigger point injections  Current NSAIDS/analgesics:  BC powder, acetaminophen, hydromorphone (back pain), lidocaine patch (back pain) Current triptans:  none Current ergotamine:  none Current anti-emetic:  none Current muscle relaxants:  Flexeril 5mg  TID PRN Current Antihypertensive medications:  metoprolol succinate 25mg  BID Current Antidepressant medications:  duloxetine 30mg  BID Current Anticonvulsant medications:  topiramate 100mg  BID, gabapentin 300mg  BID Current anti-CGRP:  Ubrelvy 100mg   Current Vitamins/Herbal/Supplements:  B12, D Current Antihistamines/Decongestants:  none Other therapy:  none Birth control:  none Other medications:  Synthroid   Caffeine:  No coffee.  BC powder Alcohol:  no Smoker:  former smoker Diet:  40 oz water daily.  Drinks diet green tea.  Tries not to  skip meals.   Exercise:  PT  twice a week s/p back surgeries.  Limited mobility.   Pain:  Post-laminectomy syndrome.  2 failed back surgeries.  Waiting on spinal nerve stimulator. Depression:  difficult due to pain preventing her from playing with her children; Anxiety:  no Sleep hygiene:  wakes up frequently related to back pain Family history of headache:  mother (migraines), father (headaches)      PAST MEDICAL HISTORY: Past Medical History:  Diagnosis Date   Asthma    Atrial flutter (HCC)    Status post RFA by Dr. 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    Iron deficiency    Iron deficiency anemia 10/02/2010   Qualifier: Diagnosis of  By: Lodema Hong MD, Margaret     Low vitamin B12 level 07/10/2013   Overview:  Last Assessment & Plan:  Being treated through hematology   Newborn product of in vitro fertilization (IVF) pregnancy    Obesity    PONV (postoperative nausea and vomiting)     PAST SURGICAL HISTORY: Past Surgical History:  Procedure Laterality Date   Bilateral eustachian tube placement on 4 different occasions     CARDIAC ELECTROPHYSIOLOGY MAPPING AND ABLATION  2004   for aflutter/afib   CARPAL TUNNEL RELEASE Bilateral 05/2018   CESAREAN SECTION N/A 05/26/2017   Procedure: CESAREAN SECTION;  Surgeon: Mitchel Honour, DO;  Location: [facility];  Service: Obstetrics;  Laterality: N/A;   CESAREAN SECTION N/A 03/01/2021   Procedure: Repeat CESAREAN SECTION;  Surgeon: Olivia Mackie, MD;  Location: [facility];  Service: Obstetrics;  Laterality: N/A;  EDD: 03/07/21   CESAREAN SECTION MULTI-GESTATIONAL N/A 02/10/2019   Procedure: Repeat CESAREAN SECTION MULTI-GESTATIONAL;  Surgeon: Olivia Mackie, MD;   Location: [facility];  Service: Obstetrics;  Laterality: N/A;  EDD: 03/15/19 Allergy: Penicillin, Morphine, Cipro   CHOLECYSTECTOMY  2003   COLONOSCOPY N/A 11/29/2013   Procedure: COLONOSCOPY;  Surgeon: Malissa Hippo, MD;  Location: [facility];  Service: Endoscopy;  Laterality: N/A;  120-rescheduled to 300 Ann notified pt   GASTRIC BYPASS  2006   LAMINECTOMY N/A 09/10/2022   Procedure: 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;  Surgeon: Jadene Pierini, MD;  Location: [facility] OR;  Service: Neurosurgery;  Laterality: N/A;  RM 20 to follow   LUMBAR LAMINECTOMY/ DECOMPRESSION WITH MET-RX Left 01/21/2023   Procedure: Left Lumbar Four-Five MIS Discectomy;  Surgeon: Jadene Pierini, MD;  Location: [facility] OR;  Service: Neurosurgery;  Laterality: Left;   TONSILLECTOMY  1990    MEDICATIONS: Current Outpatient Medications on File Prior to Visit  Medication Sig Dispense Refill   acetaminophen (TYLENOL) 500 MG tablet Take 1,000 mg by mouth every 6 (six) hours as needed for mild pain or moderate pain.     albuterol (VENTOLIN HFA) 108 (90 Base) MCG/ACT inhaler Inhale 2 puffs into the lungs every 6 (six) hours as needed for wheezing or shortness of breath. 1 each 0   Aspirin-Caffeine 845-65 MG PACK Take 1 Package by mouth 3 (three) times daily as needed (headache/pain). 56 each    Botulinum Toxin Type A (BOTOX) 200 units SOLR 155  Units See admin instructions. INJECT 155 UNITS EVERY THREE MONTHS FOR CHRONIC MIGRAINES.     cyanocobalamin (VITAMIN B12) 1000 MCG/ML injection Inject 1 mL (1,000 mcg total) into the muscle 2 (two) times a week. 10 mL 10   cyclobenzaprine (FLEXERIL) 5 MG tablet Take 5 mg by mouth 3 (three) times daily as needed for muscle spasms.     DULoxetine (CYMBALTA) 30 MG capsule Take 30 mg by mouth 2 (two) times daily.     gabapentin (NEURONTIN) 300 MG capsule Take 300 mg by mouth 2 (two) times daily.     HYDROmorphone (DILAUDID) 2 MG tablet Take 2 mg by mouth  every 8 (eight) hours.     iron polysaccharides (FERREX 150) 150 MG capsule Take 1 capsule (150 mg total) by mouth daily. 30 capsule 1   Iron Sucrose (VENOFER IV) Inject 1 Dose into the vein every 3 (three) months.     levothyroxine (SYNTHROID) 75 MCG tablet Take 75 mcg by mouth daily before breakfast.     Lidocaine (ZTLIDO) 1.8 % PTCH Place 1 patch onto the skin every 12 (twelve) hours as needed (pain).     metoprolol succinate (TOPROL-XL) 25 MG 24 hr tablet Take 2 tablets (50 mg total) by mouth daily. (Patient taking differently: Take 25 mg by mouth in the morning and at bedtime.) 180 tablet 3   NEEDLE, DISP, 25 G 25G X 1-1/2" MISC To be used with B 12 injections twice weekly 20 each 2   Prenatal Vit-Fe Fumarate-FA (PRENATAL PO) Take 1 tablet by mouth daily.     Semaglutide-Weight Management 0.25 MG/0.5ML SOAJ Inject 0.25 mg into the skin once a week. 2 mL 0   [START ON 04/07/2023] Semaglutide-Weight Management 0.5 MG/0.5ML SOAJ Inject 0.5 mg into the skin once a week. (Patient not taking: Reported on 03/24/2023) 2 mL 0   [START ON 05/05/2023] Semaglutide-Weight Management 1 MG/0.5ML SOAJ Inject 1 mg into the skin once a week. (Patient not taking: Reported on 03/24/2023) 2 mL O   topiramate (TOPAMAX) 100 MG tablet Take 100 mg by mouth 2 (two) times daily.     UBRELVY 100 MG TABS Take 100 mg by mouth See admin instructions. Take 1 tablet by mouth once as needed for migraines. May take one additional dose 2 hours after for maximum daily dose of 200mg      Vitamin D, Ergocalciferol, (DRISDOL) 1.25 MG (50000 UNIT) CAPS capsule Take 1 capsule (50,000 Units total) by mouth once a week. 5 capsule 11   No current facility-administered medications on file prior to visit.    ALLERGIES: Allergies  Allergen Reactions   Ciprofloxacin Anaphylaxis   Morphine And Codeine Anaphylaxis    Pt states she can tolerate hydromorphone   Penicillins Anaphylaxis    Has patient had a PCN reaction causing immediate rash,  facial/tongue/throat swelling, SOB or lightheadedness with hypotension: No Has patient had a PCN reaction causing severe rash involving mucus membranes or skin necrosis: No Has patient had a PCN reaction that required hospitalization: No Has patient had a PCN reaction occurring within the last 10 years: No If all of the above answers are "NO", then may proceed with Cephalosporin use.    FAMILY HISTORY: Family History  Problem Relation Age of Onset   Fibromyalgia Mother        Chronic pelvic pain    Hypothyroidism Mother    Obesity Mother    Heart defect Mother        Supraventricular, tachycardia /tachypalpation  Arrhythmia Mother    Breast cancer Mother 67   Hypertension Maternal Grandmother    Diabetes Maternal Grandmother    Hypertension Maternal Grandfather    Heart disease Paternal Grandmother    Hypertension Paternal Grandmother    Heart disease Paternal Grandfather    Hypertension Paternal Grandfather     Objective:  Blood pressure 124/68, pulse 82, height 5\' 2"  (1.575 m), weight 238 lb (108 kg), SpO2 98 %. General: No acute distress.  Patient appears well-groomed.   Head:  Normocephalic/atraumatic Eyes:  fundi examined but not visualized Neck: supple, no paraspinal tenderness, full range of motion Heart: regular rate and rhythm Neurological Exam: Mental status: alert and oriented to person, place, and time, speech fluent and not dysarthric, language intact. Cranial nerves: CN I: not tested CN II: pupils equal, round and reactive to light, visual fields intact CN III, IV, VI:  full range of motion, no nystagmus, no ptosis CN V: facial sensation intact. CN VII: upper and lower face symmetric CN VIII: hearing intact CN IX, X: gag intact, uvula midline CN XI: sternocleidomastoid and trapezius muscles intact CN XII: tongue midline Bulk & Tone: normal, no fasciculations. Motor:  muscle strength 5/5 throughout Sensation:  Pinprick, temperature and vibratory  sensation intact. Deep Tendon Reflexes:  2+ throughout,  toes downgoing.   Finger to nose testing:  Without dysmetria.   Heel to shin:  Without dysmetria.   Gait:  Normal station and stride.  Romberg negative.    Thank you for allowing me to take part in the care of this patient.  Shon Millet, DO  CC: [name], MD

## 2023-03-31 ENCOUNTER — Encounter: Payer: Self-pay | Admitting: Neurology

## 2023-03-31 ENCOUNTER — Encounter: Payer: Self-pay | Admitting: Hematology

## 2023-03-31 ENCOUNTER — Telehealth: Payer: Self-pay | Admitting: Pharmacy Technician

## 2023-03-31 ENCOUNTER — Ambulatory Visit (INDEPENDENT_AMBULATORY_CARE_PROVIDER_SITE_OTHER): Payer: BC Managed Care – PPO | Admitting: Neurology

## 2023-03-31 ENCOUNTER — Other Ambulatory Visit (HOSPITAL_COMMUNITY): Payer: Self-pay

## 2023-03-31 VITALS — BP 124/68 | HR 82 | Ht 62.0 in | Wt 238.0 lb

## 2023-03-31 DIAGNOSIS — M961 Postlaminectomy syndrome, not elsewhere classified: Secondary | ICD-10-CM

## 2023-03-31 DIAGNOSIS — G43E11 Chronic migraine with aura, intractable, with status migrainosus: Secondary | ICD-10-CM | POA: Diagnosis not present

## 2023-03-31 MED ORDER — TOPIRAMATE 100 MG PO TABS: 100.0000 mg | ORAL_TABLET | Freq: Two times a day (BID) | ORAL | 3 refills | Status: DC

## 2023-03-31 MED ORDER — UBRELVY 100 MG PO TABS: 100.0000 mg | ORAL_TABLET | ORAL | 11 refills | Status: DC | PRN

## 2023-03-31 MED ORDER — ONDANSETRON HCL 4 MG PO TABS: 4.0000 mg | ORAL_TABLET | Freq: Three times a day (TID) | ORAL | 11 refills | Status: DC | PRN

## 2023-03-31 NOTE — Patient Instructions (Signed)
Topiramate 100mg  twice daily.  Will restart Botox Ubrelvy and Zofran for migraine attacks. Stop BC powder.  Limit use of pain relievers to no more than 2 days out of week to prevent risk of rebound or medication-overuse headache. Keep headache diary Follow up for Botox

## 2023-03-31 NOTE — Telephone Encounter (Signed)
Patient Advocate Encounter  Received notification from [facility] that prior authorization for BOTOX 200U is required.   PA submitted on 5.29.24 Key [id number] Status is pending

## 2023-04-01 ENCOUNTER — Other Ambulatory Visit (HOSPITAL_COMMUNITY): Payer: Self-pay

## 2023-04-01 NOTE — Telephone Encounter (Signed)
Patient Advocate Encounter  Prior Authorization for BOTOX 200 has been approved with [employer].    PA# [id number] Effective dates: 5.30.24 through 5.1.25  Must fill with [facility] as Medical authorization.

## 2023-04-02 DIAGNOSIS — M546 Pain in thoracic spine: Secondary | ICD-10-CM | POA: Diagnosis not present

## 2023-04-02 DIAGNOSIS — M5451 Vertebrogenic low back pain: Secondary | ICD-10-CM | POA: Diagnosis not present

## 2023-04-02 MED ORDER — ONABOTULINUMTOXINA 200 UNITS IJ SOLR: INTRAMUSCULAR | 4 refills | Status: AC

## 2023-04-02 NOTE — Telephone Encounter (Signed)
Script sent to [facility].   LMOVM for patient.

## 2023-04-05 ENCOUNTER — Other Ambulatory Visit: Payer: Self-pay | Admitting: Internal Medicine

## 2023-04-05 MED ORDER — METOPROLOL SUCCINATE ER 25 MG PO TB24: 50.0000 mg | ORAL_TABLET | Freq: Every day | ORAL | 3 refills | Status: DC

## 2023-04-06 ENCOUNTER — Other Ambulatory Visit (HOSPITAL_COMMUNITY): Payer: Self-pay

## 2023-04-07 DIAGNOSIS — G894 Chronic pain syndrome: Secondary | ICD-10-CM | POA: Diagnosis not present

## 2023-04-07 DIAGNOSIS — Z79899 Other long term (current) drug therapy: Secondary | ICD-10-CM | POA: Diagnosis not present

## 2023-04-07 DIAGNOSIS — E668 Other obesity: Secondary | ICD-10-CM | POA: Diagnosis not present

## 2023-04-07 DIAGNOSIS — G8929 Other chronic pain: Secondary | ICD-10-CM | POA: Diagnosis not present

## 2023-04-07 DIAGNOSIS — G43711 Chronic migraine without aura, intractable, with status migrainosus: Secondary | ICD-10-CM | POA: Diagnosis not present

## 2023-04-07 DIAGNOSIS — M545 Low back pain, unspecified: Secondary | ICD-10-CM | POA: Diagnosis not present

## 2023-04-07 DIAGNOSIS — Z79891 Long term (current) use of opiate analgesic: Secondary | ICD-10-CM | POA: Diagnosis not present

## 2023-04-08 ENCOUNTER — Telehealth: Payer: Self-pay

## 2023-04-08 DIAGNOSIS — M546 Pain in thoracic spine: Secondary | ICD-10-CM | POA: Diagnosis not present

## 2023-04-08 DIAGNOSIS — M5451 Vertebrogenic low back pain: Secondary | ICD-10-CM | POA: Diagnosis not present

## 2023-04-08 NOTE — Telephone Encounter (Signed)
Contact person Trula Ore [phone]

## 2023-04-08 NOTE — Telephone Encounter (Signed)
Per letter received from [facility]: Message:  Please update the Prior Auth to reflect [facility] as the Servicing Provider/Facility.   Patient appt 6/14

## 2023-04-09 ENCOUNTER — Inpatient Hospital Stay: Payer: BC Managed Care – PPO | Attending: Hematology

## 2023-04-09 VITALS — BP 106/61 | HR 79 | Temp 98.2°F | Resp 18

## 2023-04-09 DIAGNOSIS — D508 Other iron deficiency anemias: Secondary | ICD-10-CM

## 2023-04-09 DIAGNOSIS — N920 Excessive and frequent menstruation with regular cycle: Secondary | ICD-10-CM | POA: Diagnosis not present

## 2023-04-09 DIAGNOSIS — D500 Iron deficiency anemia secondary to blood loss (chronic): Secondary | ICD-10-CM | POA: Insufficient documentation

## 2023-04-09 MED ORDER — SODIUM CHLORIDE 0.9 % IV SOLN: INTRAVENOUS | Status: DC

## 2023-04-09 MED ORDER — SODIUM CHLORIDE 0.9 % IV SOLN
400.0000 mg | Freq: Once | INTRAVENOUS | Status: AC
  Administered 2023-04-09: 400 mg via INTRAVENOUS
  Filled 2023-04-09: qty 400

## 2023-04-09 NOTE — Progress Notes (Signed)
Patient took own premeds for iron infusion from home.   Patient tolerated iron infusion with no complaints voiced.  Peripheral IV site clean and dry with good blood return noted before and after infusion.  Band aid applied.  VSS with discharge and left in satisfactory condition with no s/s of distress noted.

## 2023-04-09 NOTE — Patient Instructions (Signed)
[facility] [place]  Discharge Instructions: Thank you for choosing [facility] to provide your oncology and hematology care.  If you have a lab appointment with the Cancer Center - please note that after April 8th, 2024, all labs will be drawn in the cancer center.  You do not have to check in or register with the main entrance as you have in the past but will complete your check-in in the cancer center.  Wear comfortable clothing and clothing appropriate for easy access to any Portacath or PICC line.   We strive to give you quality time with your provider. You may need to reschedule your appointment if you arrive late (15 or more minutes).  Arriving late affects you and other patients whose appointments are after yours.  Also, if you miss three or more appointments without notifying the office, you may be dismissed from the clinic at the provider's discretion.      For prescription refill requests, have your pharmacy contact our office and allow 72 hours for refills to be completed.  To help prevent nausea and vomiting after your treatment, we encourage you to take your nausea medication as directed.  BELOW ARE SYMPTOMS THAT SHOULD BE REPORTED IMMEDIATELY: *FEVER GREATER THAN 100.4 F (38 C) OR HIGHER *CHILLS OR SWEATING *NAUSEA AND VOMITING THAT IS NOT CONTROLLED WITH YOUR NAUSEA MEDICATION *UNUSUAL SHORTNESS OF BREATH *UNUSUAL BRUISING OR BLEEDING *URINARY PROBLEMS (pain or burning when urinating, or frequent urination) *BOWEL PROBLEMS (unusual diarrhea, constipation, pain near the anus) TENDERNESS IN MOUTH AND THROAT WITH OR WITHOUT PRESENCE OF ULCERS (sore throat, sores in mouth, or a toothache) UNUSUAL RASH, SWELLING OR PAIN  UNUSUAL VAGINAL DISCHARGE OR ITCHING   Items with * indicate a potential emergency and should be followed up as soon as possible or go to the Emergency Department if any problems should occur.  Please show the CHEMOTHERAPY ALERT CARD or  IMMUNOTHERAPY ALERT CARD at check-in to the Emergency Department and triage nurse.  Should you have questions after your visit or need to cancel or reschedule your appointment, please contact [facility] [place] [phone]  and follow the prompts.  Office hours are 8:00 a.m. to 4:30 p.m. Monday - Friday. Please note that voicemails left after 4:00 p.m. may not be returned until the following business day.  We are closed weekends and major holidays. You have access to a nurse at all times for urgent questions. Please call the main number to the clinic [phone] and follow the prompts.  For any non-urgent questions, you may also contact your provider using MyChart. We now offer e-Visits for anyone 18 and older to request care online for non-urgent symptoms. For details visit [email or web address].   Also download the MyChart app! Go to the app store, search "MyChart", open the app, select , and log in with your MyChart username and password.   

## 2023-04-12 ENCOUNTER — Encounter: Payer: Self-pay | Admitting: Family Medicine

## 2023-04-13 ENCOUNTER — Other Ambulatory Visit (HOSPITAL_COMMUNITY): Payer: Self-pay

## 2023-04-15 DIAGNOSIS — M5451 Vertebrogenic low back pain: Secondary | ICD-10-CM | POA: Diagnosis not present

## 2023-04-15 DIAGNOSIS — M546 Pain in thoracic spine: Secondary | ICD-10-CM | POA: Diagnosis not present

## 2023-04-15 NOTE — Telephone Encounter (Signed)
Faxed approval letter to [facility] at [phone].

## 2023-04-15 NOTE — Telephone Encounter (Signed)
Auth fax to [facility].

## 2023-04-16 ENCOUNTER — Ambulatory Visit (INDEPENDENT_AMBULATORY_CARE_PROVIDER_SITE_OTHER): Payer: BC Managed Care – PPO | Admitting: Neurology

## 2023-04-16 DIAGNOSIS — G43E11 Chronic migraine with aura, intractable, with status migrainosus: Secondary | ICD-10-CM | POA: Diagnosis not present

## 2023-04-16 MED ORDER — ONABOTULINUMTOXINA 100 UNITS IJ SOLR
200.0000 [IU] | Freq: Once | INTRAMUSCULAR | Status: AC
Start: 2023-04-16 — End: 2023-04-16
  Administered 2023-04-16: 155 [IU] via INTRAMUSCULAR

## 2023-04-16 NOTE — Progress Notes (Signed)
[facility]  ° °Procedure Note Botox ° °Attending: Dr. Micaella Gitto ° °Preoperative Diagnosis(es): Chronic migraine ° °Consent obtained from: The patient °Benefits discussed included, but were not limited to decreased muscle tightness, increased joint range of motion, and decreased pain.  Risk discussed included, but were not limited pain and discomfort, bleeding, bruising, excessive weakness, venous thrombosis, muscle atrophy and dysphagia.  Anticipated outcomes of the procedure as well as he risks and benefits of the alternatives to the procedure, and the roles and tasks of the personnel to be involved, were discussed with the patient, and the patient consents to the procedure and agrees to proceed. A copy of the patient medication guide was given to the patient which explains the blackbox warning. ° °Patients identity and treatment sites confirmed Yes.  . ° °Details of Procedure: °Skin was cleaned with alcohol. Prior to injection, the needle plunger was aspirated to make sure the needle was not within a blood vessel.  There was no blood retrieved on aspiration.   ° °Following is a summary of the muscles injected  And the amount of Botulinum toxin used: ° °Dilution °200 units of Botox was reconstituted with 4 ml of preservative free normal saline. °Time of reconstitution: At the time of the office visit (<30 minutes prior to injection)  ° °Injections  °155 total units of Botox was injected with a 30 gauge needle. ° °Injection Sites: °L occipitalis: 15 units- 3 sites  °R occiptalis: 15 units- 3 sites ° °L upper trapezius: 15 units- 3 sites °R upper trapezius: 15 units- 3 sits          °L paraspinal: 10 units- 2 sites °R paraspinal: 10 units- 2 sites ° °Face °L frontalis(2 injection sites):10 units   °R frontalis(2 injection sites):10 units         °L corrugator: 5 units   °R corrugator: 5 units           °Procerus: 5 units   °L temporalis: 20 units °R temporalis: 20 units  ° °Agent:  °200 units of botulinum Type  A (Onobotulinum Toxin type A) was reconstituted with 4 ml of preservative free normal saline.  °Time of reconstitution: At the time of the office visit (<30 minutes prior to injection)  ° ° ° Total injected (Units):  155 ° Total wasted (Units):  45 ° °Patient tolerated procedure well without complications.   °Reinjection is anticipated in 3 months. ° ° °

## 2023-04-19 ENCOUNTER — Telehealth: Payer: Self-pay | Admitting: Family Medicine

## 2023-04-19 NOTE — Telephone Encounter (Signed)
error 

## 2023-04-22 DIAGNOSIS — M546 Pain in thoracic spine: Secondary | ICD-10-CM | POA: Diagnosis not present

## 2023-04-22 DIAGNOSIS — M5451 Vertebrogenic low back pain: Secondary | ICD-10-CM | POA: Diagnosis not present

## 2023-05-05 DIAGNOSIS — M545 Low back pain, unspecified: Secondary | ICD-10-CM | POA: Diagnosis not present

## 2023-05-05 DIAGNOSIS — G894 Chronic pain syndrome: Secondary | ICD-10-CM | POA: Diagnosis not present

## 2023-05-05 DIAGNOSIS — Z79899 Other long term (current) drug therapy: Secondary | ICD-10-CM | POA: Diagnosis not present

## 2023-05-05 DIAGNOSIS — Z79891 Long term (current) use of opiate analgesic: Secondary | ICD-10-CM | POA: Diagnosis not present

## 2023-05-05 DIAGNOSIS — M542 Cervicalgia: Secondary | ICD-10-CM | POA: Diagnosis not present

## 2023-05-05 DIAGNOSIS — G8929 Other chronic pain: Secondary | ICD-10-CM | POA: Diagnosis not present

## 2023-05-05 DIAGNOSIS — M546 Pain in thoracic spine: Secondary | ICD-10-CM | POA: Diagnosis not present

## 2023-05-11 ENCOUNTER — Encounter: Payer: Self-pay | Admitting: Hematology

## 2023-05-11 ENCOUNTER — Encounter: Payer: Self-pay | Admitting: Family Medicine

## 2023-05-11 ENCOUNTER — Ambulatory Visit (INDEPENDENT_AMBULATORY_CARE_PROVIDER_SITE_OTHER): Payer: BC Managed Care – PPO | Admitting: Family Medicine

## 2023-05-11 VITALS — BP 89/66 | HR 94 | Ht 61.0 in | Wt 218.0 lb

## 2023-05-11 DIAGNOSIS — Z13220 Encounter for screening for lipoid disorders: Secondary | ICD-10-CM

## 2023-05-11 DIAGNOSIS — E039 Hypothyroidism, unspecified: Secondary | ICD-10-CM | POA: Diagnosis not present

## 2023-05-11 DIAGNOSIS — M5416 Radiculopathy, lumbar region: Secondary | ICD-10-CM | POA: Diagnosis not present

## 2023-05-11 DIAGNOSIS — I4892 Unspecified atrial flutter: Secondary | ICD-10-CM

## 2023-05-11 DIAGNOSIS — D508 Other iron deficiency anemias: Secondary | ICD-10-CM

## 2023-05-11 MED ORDER — TIRZEPATIDE 2.5 MG/0.5ML ~~LOC~~ SOAJ
2.5000 mg | SUBCUTANEOUS | 0 refills | Status: DC
Start: 2023-05-11 — End: 2023-07-06

## 2023-05-11 NOTE — Assessment & Plan Note (Signed)
Updated lab needed at/ before next visit.   

## 2023-05-11 NOTE — Progress Notes (Signed)
   Alejandra Barr     MRN: [id number]      DOB: 03-18-1986  Chief Complaint  Patient presents with   Weight Loss    Started wegovy and needs PA to be appealed.    HPI Alejandra Barr is here for follow up and re-evaluation of chronic medical conditions, medication management and review of any available recent lab and radiology data.  Preventive health is updated, specifically  Cancer screening and Immunization.   Questions or concerns regarding consultations or procedures which the PT has had in the interim are  addressed. 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 Denies sinus pressure, nasal congestion, ear pain or sore throat. Denies chest congestion, productive cough or wheezing. Denies chest pains, palpitations and leg swelling Denies abdominal pain, nausea, vomiting,diarrhea or constipation.   Denies dysuria, frequency, hesitancy or incontinence.  Denies depression, anxiety or insomnia. Denies skin break down or rash.   PE  BP (!) 89/66   Pulse 94   Ht 5\' 1"  (1.549 m)   Wt 218 lb 0.6 oz (98.9 kg)   SpO2 94%   BMI 41.20 kg/m   Patient alert and oriented and in no cardiopulmonary distress.  HEENT: No facial asymmetry, EOMI,     Neck supple .  Chest: Clear to auscultation bilaterally.  CVS: S1, S2 no murmurs, no S3.Regular rate.  ABD: Soft non tender.   Ext: No edema  MS: decreased  ROM spine,adequate in  shoulders, hips and knees.  Skin: Intact, no ulcerations or rash noted.  Psych: Good eye contact, normal affect. Memory intact not anxious or depressed appearing.  CNS: CN 2-12 intact, power,  normal throughout.no focal  deficits noted.   Assessment & Plan  Morbid obesity [facility])  Patient re-educated about  the importance of commitment to a  minimum of 150 minutes of exercise per week as able.  The importance of healthy food choices with portion control discussed, as well as eating regularly and within a 12 hour window most days. The need to choose "clean , green" food 50 to 75% of the time is discussed, as well as to make water the primary drink and set a goal of 64 ounces water daily.       05/11/2023    4:28 PM 03/31/2023   11:11 AM 03/04/2023    3:41 PM  Weight /BMI  Weight 218 lb 0.6 oz 238 lb 230 lb 12.8 oz  Height 5\' 1"  (1.549 m) 5\' 2"  (1.575 m) 5' 1.5" (1.562 m)  BMI 41.2 kg/m2 43.53 kg/m2 42.9 kg/m2    Excellent response to wegovy, zepbound in more preferred staus will try to pA, she has had bariatric surgery, tried phentermine in recent psdt with no success, has debilitating back pain  Hypothyroidism Updated lab needed at/ before next visit.   Lumbar back pain with radiculopathy affecting right lower extremity Uncontrolled pain s.\/p surgery, spinal cord stimulator to  be placed  Iron deficiency anemia Managed by hematology  Atrial flutter (HCC) O metoprolo bu cardiology, no palpitations, will need recheck onbP reported low  at visit

## 2023-05-11 NOTE — Assessment & Plan Note (Signed)
  Patient re-educated about  the importance of commitment to a  minimum of 150 minutes of exercise per week as able.  The importance of healthy food choices with portion control discussed, as well as eating regularly and within a 12 hour window most days. The need to choose "clean , green" food 50 to 75% of the time is discussed, as well as to make water the primary drink and set a goal of 64 ounces water daily.       05/11/2023    4:28 PM 03/31/2023   11:11 AM 03/04/2023    3:41 PM  Weight /BMI  Weight 218 lb 0.6 oz 238 lb 230 lb 12.8 oz  Height 5\' 1"  (1.549 m) 5\' 2"  (1.575 m) 5' 1.5" (1.562 m)  BMI 41.2 kg/m2 43.53 kg/m2 42.9 kg/m2    Excellent response to wegovy, zepbound in more preferred staus will try to pA, she has had bariatric surgery, tried phentermine in recent psdt with no success, has debilitating back pain

## 2023-05-11 NOTE — Patient Instructions (Addendum)
F/U in 8 weeks, congrats on excellent weight loss   We will send for Zepbound  based on your response to similar and past bypass surgery  It is important that you exercise regularly at least 30 minutes 5 times a week. If you develop chest pain, have severe difficulty breathing, or feel very tired, stop exercising immediately and seek medical attention   Fasting lipid, cmp and eGFr and TSH this week please  PLEASE let me know about new medication availability  All the best with upcoming surgery , and keep ther faith  Thanks for choosing [facility], we consider it a privelige to serve you.

## 2023-05-11 NOTE — Assessment & Plan Note (Signed)
Uncontrolled pain s.\/p surgery, spinal cord stimulator to  be placed

## 2023-05-11 NOTE — Assessment & Plan Note (Signed)
Managed by hematology 

## 2023-05-11 NOTE — Assessment & Plan Note (Signed)
O metoprolo bu cardiology, no palpitations, will need recheck onbP reported low  at visit

## 2023-05-18 DIAGNOSIS — D321 Benign neoplasm of spinal meninges: Secondary | ICD-10-CM | POA: Insufficient documentation

## 2023-05-18 DIAGNOSIS — Z79899 Other long term (current) drug therapy: Secondary | ICD-10-CM | POA: Diagnosis not present

## 2023-05-18 DIAGNOSIS — Z5181 Encounter for therapeutic drug level monitoring: Secondary | ICD-10-CM | POA: Diagnosis not present

## 2023-05-18 DIAGNOSIS — M961 Postlaminectomy syndrome, not elsewhere classified: Secondary | ICD-10-CM | POA: Diagnosis not present

## 2023-05-20 DIAGNOSIS — M47814 Spondylosis without myelopathy or radiculopathy, thoracic region: Secondary | ICD-10-CM | POA: Diagnosis not present

## 2023-05-20 DIAGNOSIS — M2578 Osteophyte, vertebrae: Secondary | ICD-10-CM | POA: Diagnosis not present

## 2023-05-20 DIAGNOSIS — M5134 Other intervertebral disc degeneration, thoracic region: Secondary | ICD-10-CM | POA: Diagnosis not present

## 2023-05-20 DIAGNOSIS — M48061 Spinal stenosis, lumbar region without neurogenic claudication: Secondary | ICD-10-CM | POA: Diagnosis not present

## 2023-05-20 DIAGNOSIS — M47816 Spondylosis without myelopathy or radiculopathy, lumbar region: Secondary | ICD-10-CM | POA: Diagnosis not present

## 2023-05-20 DIAGNOSIS — M5136 Other intervertebral disc degeneration, lumbar region: Secondary | ICD-10-CM | POA: Diagnosis not present

## 2023-05-20 DIAGNOSIS — M4802 Spinal stenosis, cervical region: Secondary | ICD-10-CM | POA: Diagnosis not present

## 2023-05-20 DIAGNOSIS — M40204 Unspecified kyphosis, thoracic region: Secondary | ICD-10-CM | POA: Diagnosis not present

## 2023-05-20 DIAGNOSIS — M4804 Spinal stenosis, thoracic region: Secondary | ICD-10-CM | POA: Diagnosis not present

## 2023-06-02 DIAGNOSIS — G43711 Chronic migraine without aura, intractable, with status migrainosus: Secondary | ICD-10-CM | POA: Diagnosis not present

## 2023-06-02 DIAGNOSIS — Z79899 Other long term (current) drug therapy: Secondary | ICD-10-CM | POA: Diagnosis not present

## 2023-06-02 DIAGNOSIS — M546 Pain in thoracic spine: Secondary | ICD-10-CM | POA: Diagnosis not present

## 2023-06-02 DIAGNOSIS — G8929 Other chronic pain: Secondary | ICD-10-CM | POA: Diagnosis not present

## 2023-06-02 DIAGNOSIS — M545 Low back pain, unspecified: Secondary | ICD-10-CM | POA: Diagnosis not present

## 2023-06-02 DIAGNOSIS — Z79891 Long term (current) use of opiate analgesic: Secondary | ICD-10-CM | POA: Diagnosis not present

## 2023-06-02 DIAGNOSIS — G894 Chronic pain syndrome: Secondary | ICD-10-CM | POA: Diagnosis not present

## 2023-06-14 ENCOUNTER — Encounter: Payer: Self-pay | Admitting: Family Medicine

## 2023-06-15 MED ORDER — PHENTERMINE HCL 37.5 MG PO TABS: 37.5000 mg | ORAL_TABLET | Freq: Every day | ORAL | 0 refills | Status: DC

## 2023-06-18 DIAGNOSIS — G894 Chronic pain syndrome: Secondary | ICD-10-CM | POA: Diagnosis not present

## 2023-06-18 DIAGNOSIS — M961 Postlaminectomy syndrome, not elsewhere classified: Secondary | ICD-10-CM | POA: Diagnosis not present

## 2023-06-23 ENCOUNTER — Inpatient Hospital Stay: Payer: BC Managed Care – PPO

## 2023-06-24 ENCOUNTER — Inpatient Hospital Stay: Payer: BC Managed Care – PPO | Attending: Hematology

## 2023-06-24 DIAGNOSIS — E559 Vitamin D deficiency, unspecified: Secondary | ICD-10-CM | POA: Diagnosis not present

## 2023-06-24 DIAGNOSIS — E538 Deficiency of other specified B group vitamins: Secondary | ICD-10-CM

## 2023-06-24 DIAGNOSIS — D500 Iron deficiency anemia secondary to blood loss (chronic): Secondary | ICD-10-CM | POA: Insufficient documentation

## 2023-06-24 DIAGNOSIS — N920 Excessive and frequent menstruation with regular cycle: Secondary | ICD-10-CM | POA: Diagnosis not present

## 2023-06-24 LAB — CBC WITH DIFFERENTIAL/PLATELET
Abs Immature Granulocytes: 0.04 10*3/uL (ref 0.00–0.07)
Basophils Absolute: 0.1 10*3/uL (ref 0.0–0.1)
Basophils Relative: 1 %
Eosinophils Absolute: 0.5 10*3/uL (ref 0.0–0.5)
Eosinophils Relative: 5 %
HCT: 41.5 % (ref 36.0–46.0)
Hemoglobin: 13.3 g/dL (ref 12.0–15.0)
Immature Granulocytes: 1 %
Lymphocytes Relative: 21 %
Lymphs Abs: 1.8 10*3/uL (ref 0.7–4.0)
MCH: 28.9 pg (ref 26.0–34.0)
MCHC: 32 g/dL (ref 30.0–36.0)
MCV: 90.2 fL (ref 80.0–100.0)
Monocytes Absolute: 0.6 10*3/uL (ref 0.1–1.0)
Monocytes Relative: 6 %
Neutro Abs: 5.8 10*3/uL (ref 1.7–7.7)
Neutrophils Relative %: 66 %
Platelets: 313 10*3/uL (ref 150–400)
RBC: 4.6 MIL/uL (ref 3.87–5.11)
RDW: 13.3 % (ref 11.5–15.5)
WBC: 8.8 10*3/uL (ref 4.0–10.5)
nRBC: 0 % (ref 0.0–0.2)

## 2023-06-24 LAB — IRON AND TIBC
Iron: 70 ug/dL (ref 28–170)
Saturation Ratios: 20 % (ref 10.4–31.8)
TIBC: 347 ug/dL (ref 250–450)
UIBC: 277 ug/dL

## 2023-06-24 LAB — FERRITIN: Ferritin: 124 ng/mL (ref 11–307)

## 2023-06-24 LAB — VITAMIN D 25 HYDROXY (VIT D DEFICIENCY, FRACTURES): Vit D, 25-Hydroxy: 25.98 ng/mL — ABNORMAL LOW (ref 30–100)

## 2023-06-30 ENCOUNTER — Ambulatory Visit: Payer: BC Managed Care – PPO | Admitting: Physician Assistant

## 2023-07-05 NOTE — Progress Notes (Unsigned)
[facility] [address], [place] [address]   CLINIC:  Medical Oncology/Hematology  PCP:  Alejandra Perches, MD [address] [place] [address] [phone]   REASON FOR VISIT: Iron deficiency anemia and B12 deficiency   CURRENT THERAPY: Intermittent IV iron   INTERVAL HISTORY:   [name] 37 y.o. female returns for routine follow-up of her iron deficiency anemia and B12 deficiency.  She was evaluated via telephone visit with Alejandra Brenner PA-C on 03/24/2023.  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   At today's visit, she reports feeling fair with mild fatigue in addition to ongoing back pain.  She continues to have menorrhagia every 2-3 weeks after giving birth to her daughter in April 2022, has not yet spoken with her GYN regarding this.  She denies bright red blood per rectum or melena.   She denies any pica, restless legs, chest pain, dyspnea on exertion, lightheadedness, or syncope.   She has chronic migraine headaches.   She is continuing to take her daily iron supplements, weekly folic acid supplement, and twice weekly B12 injections.  She restarted vitamin D supplements after her last visit.  She has 70% energy and 90% appetite. She endorses that she is maintaining a stable weight.   ASSESSMENT & PLAN:  1.  Iron deficiency anemia: - History of gastric bypass surgery - Iron deficiency anemia is caused by menorrhagia and malabsorption  -- Most recent IV iron with Venofer 400 mg x 1 on 04/09/2023 - Symptoms improved after IV iron improved - Most recent labs (06/24/2023): Hgb 13.3/MCV 90.2, ferritin 124, iron saturation 20% - Currently taking prenatal vitamin and ferrous sulfate  325 mg daily - PLAN: No indication for IV iron at this time - Continue oral iron tablet once daily. - Recommend GYN follow-up for menorrhagia - RTC in 3 months with repeat labs. (Patient prefers follow-up every 3 months due to sudden drops in her iron).  Prefers phone visits, but she is agreeable to annual in person visit    2.  Vitamin B12 deficiency: - Currently taking B12 injection twice weekly as well as sublingual B12 daily - Most recent B12 (03/17/2023) level improved at 326, MMA normal - PLAN: Continue B12 injections to TWICE WEEKLY.   Recheck B12/MMA at next visit   3.  Vitamin D deficiency: - Most recent vitamin D level (06/24/2023) low at 25.98, but improved from previous (18.6 in May 2024) - Vitamin D 50,000 units weekly was restarted in May 2024 - PLAN: Restart vitamin D once a week (refill sent to pharmacy).  Recheck vitamin D in 3 months    4.  Folic acid deficiency: - Folate checked on 03/17/2023 normal at 9.0 - Currently taking folic acid 1 mg daily     - PLAN: Continue folic acid supplement.  Recheck folic acid at next visit  PLAN SUMMARY: >> Labs in 3 months = CBC/D, ferritin, iron/TIBC, vitamin D, B12, MMA, folate >> PHONE visit in 3 months (1 week after labs)  ** Last office visit 07/06/2023     REVIEW OF SYSTEMS:   Review of Systems  Constitutional:  Positive for fatigue. Negative for appetite change, chills, diaphoresis, fever and unexpected weight change.  HENT:  Negative for lump/mass and nosebleeds.   Eyes:  Negative for eye problems.  Respiratory:  Negative for cough, hemoptysis and shortness of breath.   Cardiovascular:  Negative for chest pain, leg swelling and palpitations.  Gastrointestinal:  Negative for abdominal pain, blood in stool, constipation, diarrhea, nausea and vomiting.  Genitourinary:  Positive for menstrual problem. Negative for hematuria.   Musculoskeletal:  Positive for back pain.  Skin: Negative.   Neurological:  Positive for headaches  and numbness. Negative for dizziness and light-headedness.  Hematological:  Does not bruise/bleed easily.     PHYSICAL EXAM:  ECOG PERFORMANCE STATUS: 1 - Symptomatic but completely ambulatory  Vitals:   07/06/23 1316  BP: 116/68  Pulse: 77  Resp: 18  Temp: 98.7 F (37.1 C)  SpO2: 99%   Filed Weights   07/06/23 1316  Weight: 242 lb 12.8 oz (110.1 kg)   Physical Exam Constitutional:      Appearance: Normal appearance. She is morbidly obese.  Cardiovascular:     Heart sounds: Normal heart sounds.  Pulmonary:     Breath sounds: Normal breath sounds.  Neurological:     General: No focal deficit present.     Mental Status: Mental status is at baseline.  Psychiatric:        Behavior: Behavior normal. Behavior is cooperative.     PAST MEDICAL/SURGICAL HISTORY:  Past Medical History:  Diagnosis Date   Asthma    Atrial flutter (HCC)    Status post RFA by Dr. 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    Iron deficiency    Iron deficiency anemia 10/02/2010   Qualifier: Diagnosis of  By: Alejandra Hong MD, Alejandra Barr     Low vitamin B12 level 07/10/2013   Overview:  Last Assessment & Plan:  Being treated through hematology   Newborn product of in vitro fertilization (IVF) pregnancy    Obesity    PONV (postoperative nausea and vomiting)    Past Surgical History:  Procedure Laterality Date   Bilateral eustachian tube placement on 4 different occasions     CARDIAC ELECTROPHYSIOLOGY MAPPING AND ABLATION  2004   for aflutter/afib   CARPAL TUNNEL RELEASE Bilateral 05/2018   CESAREAN SECTION N/A 05/26/2017   Procedure: CESAREAN SECTION;  Surgeon: Alejandra Honour, DO;  Location: [facility];   Service: Obstetrics;  Laterality: N/A;   CESAREAN SECTION N/A 03/01/2021   Procedure: Repeat CESAREAN SECTION;  Surgeon: Alejandra Mackie, MD;  Location: [facility] LD ORS;  Service: Obstetrics;  Laterality: N/A;  EDD: 03/07/21   CESAREAN SECTION MULTI-GESTATIONAL N/A 02/10/2019   Procedure: Repeat CESAREAN SECTION MULTI-GESTATIONAL;  Surgeon: Alejandra Mackie, MD;  Location: [facility] LD ORS;  Service: Obstetrics;  Laterality: N/A;  EDD: 03/15/19 Allergy: Penicillin, Morphine, Cipro   CHOLECYSTECTOMY  2003   COLONOSCOPY N/A 11/29/2013   Procedure: COLONOSCOPY;  Surgeon: Malissa Hippo, MD;  Location: [facility];  Service: Endoscopy;  Laterality: N/A;  120-rescheduled to 300 Ann notified pt   GASTRIC BYPASS  2006   LAMINECTOMY N/A 09/10/2022   Procedure: 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;  Surgeon: Jadene Pierini, MD;  Location: [facility] OR;  Service:  Neurosurgery;  Laterality: N/A;  RM 20 to follow   LUMBAR LAMINECTOMY/ DECOMPRESSION WITH MET-RX Left 01/21/2023   Procedure: Left Lumbar Four-Five MIS Discectomy;  Surgeon: Jadene Pierini, MD;  Location: [facility] OR;  Service: Neurosurgery;  Laterality: Left;   TONSILLECTOMY  1990    SOCIAL HISTORY:  Social History   Socioeconomic History   Marital status: Married    Spouse name: Onalee Hua   Number of children: Not on file   Years of education: Not on file   Highest education level: Bachelor's degree (e.g., BA, AB, BS)  Occupational History   Occupation: Producer, television/film/video: [employer]  Tobacco Use   Smoking status: Former    Current packs/day: 0.00    Average packs/day: 0.5 packs/day for 2.2 years (1.1 ttl pk-yrs)    Types: Cigarettes    Start date: 03/21/2013    Quit date: 06/02/2015    Years since quitting: 8.0   Smokeless tobacco: Never  Vaping Use   Vaping status: Never Used  Substance and Sexual Activity   Alcohol use: No    Alcohol/week: 0.0 standard drinks of alcohol    Comment: sober alcoholic 6 years   Drug  use: No   Sexual activity: Not Currently    Birth control/protection: None    Comment: husband had vasectomy  Other Topics Concern   Not on file  Social History Narrative   Are you right handed or left handed? Right   Are you currently employed ? 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 N   Who lives with you? Kids,spouse   What type of home do you live in: 1 story or 2 story? 2))       Social Determinants of Health   Financial Resource Strain: Low Risk  (06/24/2023)   Received from [facility]   Overall Financial Resource Strain (CARDIA)    Difficulty of Paying Living Expenses: Not very hard  Food Insecurity: No Food Insecurity (06/24/2023)   [facility]   Hunger Vital Sign    Worried About Running Out of Food in the Last Year: Never true    Ran Out of Food in the Last Year: Never true  Transportation Needs: No Transportation Needs (06/24/2023)   Received from [facility] - Transportation    Lack of Transportation (Medical): No    Lack of Transportation (Non-Medical): No  Physical Activity: Unknown (06/24/2023)   Received from [facility]   Exercise Vital Sign    Days of Exercise per Week: 0 days    Minutes of Exercise per Session: Not on file  Stress: No Stress Concern Present (06/24/2023)   Received from [facility] - Occupational Stress Questionnaire    Feeling of Stress : Only a little  Social Connections: Moderately Integrated (06/24/2023)   Received from [facility]   Social Network    How would you rate your social network (family, work, friends)?: Adequate participation with social networks  Intimate Partner Violence: Not At Risk (06/24/2023)   Received from [facility]   HITS    Over the last 12 months how often did your partner physically hurt you?: 1    Over the last 12 months how often did your partner insult you or talk down to you?: 1    Over the last  12 months how often did your partner threaten you with physical harm?:  1    Over the last 12 months how often did your partner scream or curse at you?: 1    FAMILY HISTORY:  Family History  Problem Relation Age of Onset   Migraines Mother    Fibromyalgia Mother        Chronic pelvic pain    Hypothyroidism Mother    Obesity Mother    Heart defect Mother        Supraventricular, tachycardia /tachypalpation    Arrhythmia Mother    Breast cancer Mother 19   Stroke Maternal Grandmother    Hypertension Maternal Grandmother    Diabetes Maternal Grandmother    Hypertension Maternal Grandfather    Heart disease Paternal Grandmother    Hypertension Paternal Grandmother    Heart disease Paternal Grandfather    Hypertension Paternal Grandfather     CURRENT MEDICATIONS:  Outpatient Encounter Medications as of 07/06/2023  Medication Sig   acetaminophen (TYLENOL) 500 MG tablet Take 1,000 mg by mouth every 6 (six) hours as needed for mild pain or moderate pain.   albuterol (VENTOLIN HFA) 108 (90 Base) MCG/ACT inhaler Inhale 2 puffs into the lungs every 6 (six) hours as needed for wheezing or shortness of breath.   Aspirin-Caffeine 845-65 MG PACK Take 1 Package by mouth 3 (three) times daily as needed (headache/pain).   botulinum toxin Type A (BOTOX) 200 units injection Inject 155 units IM into multiple site in the face,neck and head once every 90 days   Botulinum Toxin Type A (BOTOX) 200 units SOLR 155 Units See admin instructions. INJECT 155 UNITS EVERY THREE MONTHS FOR CHRONIC MIGRAINES.   cyanocobalamin (VITAMIN B12) 1000 MCG/ML injection Inject 1 mL (1,000 mcg total) into the muscle 2 (two) times a week.   cyclobenzaprine (FLEXERIL) 5 MG tablet Take 5 mg by mouth 3 (three) times daily as needed for muscle spasms.   DULoxetine (CYMBALTA) 30 MG capsule Take 30 mg by mouth 2 (two) times daily.   gabapentin (NEURONTIN) 300 MG capsule Take 300 mg by mouth 2 (two) times daily.   iron  polysaccharides (FERREX 150) 150 MG capsule Take 1 capsule (150 mg total) by mouth daily.   Iron Sucrose (VENOFER IV) Inject 1 Dose into the vein every 3 (three) months.   levothyroxine (SYNTHROID) 75 MCG tablet Take 75 mcg by mouth daily before breakfast.   Lidocaine (ZTLIDO) 1.8 % PTCH Place 1 patch onto the skin every 12 (twelve) hours as needed (pain).   metoprolol succinate (TOPROL-XL) 25 MG 24 hr tablet Take 2 tablets (50 mg total) by mouth daily.   NEEDLE, DISP, 25 G 25G X 1-1/2" MISC To be used with B 12 injections twice weekly   ondansetron (ZOFRAN) 4 MG tablet Take 1 tablet (4 mg total) by mouth every 8 (eight) hours as needed for nausea or vomiting.   phentermine (ADIPEX-P) 37.5 MG tablet Take 1 tablet (37.5 mg total) by mouth daily before breakfast.   Prenatal Vit-Fe Fumarate-FA (PRENATAL PO) Take 1 tablet by mouth daily.   Semaglutide-Weight Management 0.5 MG/0.5ML SOAJ Inject 0.5 mg into the skin once a week.   topiramate (TOPAMAX) 100 MG tablet Take 1 tablet (100 mg total) by mouth 2 (two) times daily.   UBRELVY 100 MG TABS Take 1 tablet (100 mg total) by mouth as needed. May repeat after 2 hours.  Maximum 2 tablets in 24 hours.   Vitamin D, Ergocalciferol, (DRISDOL) 1.25 MG (50000 UNIT) CAPS capsule Take 1 capsule (50,000 Units total) by mouth once a  week.   [DISCONTINUED] HYDROmorphone (DILAUDID) 2 MG tablet Take 4 mg by mouth every 6 (six) hours as needed for moderate pain or severe pain.   [DISCONTINUED] tirzepatide [facility]) 2.5 MG/0.5ML Pen Inject 2.5 mg into the skin once a week.   No facility-administered encounter medications on file as of 07/06/2023.    ALLERGIES:  Allergies  Allergen Reactions   Ciprofloxacin Anaphylaxis   Morphine And Codeine Anaphylaxis    Pt states she can tolerate hydromorphone   Penicillins Anaphylaxis    Has patient had a PCN reaction causing immediate rash, facial/tongue/throat swelling, SOB or lightheadedness with hypotension: No Has  patient had a PCN reaction causing severe rash involving mucus membranes or skin necrosis: No Has patient had a PCN reaction that required hospitalization: No Has patient had a PCN reaction occurring within the last 10 years: No If all of the above answers are "NO", then may proceed with Cephalosporin use.    LABORATORY DATA:  I have reviewed the labs as listed.  CBC    Component Value Date/Time   WBC 8.8 06/24/2023 1509   RBC 4.60 06/24/2023 1509   HGB 13.3 06/24/2023 1509   HCT 41.5 06/24/2023 1509   PLT 313 06/24/2023 1509   MCV 90.2 06/24/2023 1509   MCH 28.9 06/24/2023 1509   MCHC 32.0 06/24/2023 1509   RDW 13.3 06/24/2023 1509   LYMPHSABS 1.8 06/24/2023 1509   MONOABS 0.6 06/24/2023 1509   EOSABS 0.5 06/24/2023 1509   BASOSABS 0.1 06/24/2023 1509      Latest Ref Rng & Units 01/19/2023    3:00 PM 09/12/2022    1:30 PM 08/14/2022    5:02 PM  CMP  Glucose 70 - 99 mg/dL 409  811  97   BUN 6 - 20 mg/dL 9  12  15    Creatinine 0.44 - 1.00 mg/dL 9.14  7.82  9.56   Sodium 135 - 145 mmol/L 137  139  140   Potassium 3.5 - 5.1 mmol/L 3.9  3.9  3.7   Chloride 98 - 111 mmol/L 106  103  101   CO2 22 - 32 mmol/L 24  24  24    Calcium 8.9 - 10.3 mg/dL 8.5  9.4  9.3   Total Protein 6.5 - 8.1 g/dL  6.6    Total Bilirubin 0.3 - 1.2 mg/dL  0.4    Alkaline Phos 38 - 126 U/L  253    AST 15 - 41 U/L  138    ALT 0 - 44 U/L  147      DIAGNOSTIC IMAGING:  I have independently reviewed the relevant imaging and discussed with the patient.   WRAP UP:  All questions were answered. The patient knows to call the clinic with any problems, questions or concerns.  Medical decision making: Low  Time spent on visit: I spent 15 minutes counseling the patient face to face. The total time spent in the appointment was 22 minutes and more than 50% was on counseling.  Carnella Guadalajara, PA-C  07/06/23 1:41 PM

## 2023-07-06 ENCOUNTER — Inpatient Hospital Stay: Payer: BC Managed Care – PPO | Attending: Hematology | Admitting: Physician Assistant

## 2023-07-06 VITALS — BP 116/68 | HR 77 | Temp 98.7°F | Resp 18 | Ht 62.0 in | Wt 242.8 lb

## 2023-07-06 DIAGNOSIS — M549 Dorsalgia, unspecified: Secondary | ICD-10-CM | POA: Insufficient documentation

## 2023-07-06 DIAGNOSIS — D508 Other iron deficiency anemias: Secondary | ICD-10-CM

## 2023-07-06 DIAGNOSIS — E538 Deficiency of other specified B group vitamins: Secondary | ICD-10-CM

## 2023-07-06 DIAGNOSIS — D500 Iron deficiency anemia secondary to blood loss (chronic): Secondary | ICD-10-CM | POA: Diagnosis not present

## 2023-07-06 DIAGNOSIS — Z87891 Personal history of nicotine dependence: Secondary | ICD-10-CM | POA: Diagnosis not present

## 2023-07-06 DIAGNOSIS — K909 Intestinal malabsorption, unspecified: Secondary | ICD-10-CM | POA: Insufficient documentation

## 2023-07-06 DIAGNOSIS — R7989 Other specified abnormal findings of blood chemistry: Secondary | ICD-10-CM

## 2023-07-06 DIAGNOSIS — N920 Excessive and frequent menstruation with regular cycle: Secondary | ICD-10-CM | POA: Insufficient documentation

## 2023-07-06 DIAGNOSIS — Z13220 Encounter for screening for lipoid disorders: Secondary | ICD-10-CM | POA: Diagnosis not present

## 2023-07-06 DIAGNOSIS — E039 Hypothyroidism, unspecified: Secondary | ICD-10-CM | POA: Diagnosis not present

## 2023-07-06 DIAGNOSIS — Z9884 Bariatric surgery status: Secondary | ICD-10-CM | POA: Insufficient documentation

## 2023-07-06 DIAGNOSIS — E559 Vitamin D deficiency, unspecified: Secondary | ICD-10-CM

## 2023-07-07 DIAGNOSIS — G43719 Chronic migraine without aura, intractable, without status migrainosus: Secondary | ICD-10-CM | POA: Diagnosis not present

## 2023-07-07 DIAGNOSIS — G43711 Chronic migraine without aura, intractable, with status migrainosus: Secondary | ICD-10-CM | POA: Diagnosis not present

## 2023-07-07 DIAGNOSIS — G43E11 Chronic migraine with aura, intractable, with status migrainosus: Secondary | ICD-10-CM | POA: Diagnosis not present

## 2023-07-08 ENCOUNTER — Ambulatory Visit: Payer: BC Managed Care – PPO | Admitting: Family Medicine

## 2023-07-12 DIAGNOSIS — G894 Chronic pain syndrome: Secondary | ICD-10-CM | POA: Diagnosis not present

## 2023-07-12 DIAGNOSIS — M961 Postlaminectomy syndrome, not elsewhere classified: Secondary | ICD-10-CM | POA: Diagnosis not present

## 2023-07-16 ENCOUNTER — Ambulatory Visit (INDEPENDENT_AMBULATORY_CARE_PROVIDER_SITE_OTHER): Payer: BC Managed Care – PPO | Admitting: Neurology

## 2023-07-16 DIAGNOSIS — G43E11 Chronic migraine with aura, intractable, with status migrainosus: Secondary | ICD-10-CM | POA: Diagnosis not present

## 2023-07-16 NOTE — Progress Notes (Signed)
[facility]  ° °Procedure Note Botox ° °Attending: Dr. Everett Ricciardelli ° °Preoperative Diagnosis(es): Chronic migraine ° °Consent obtained from: The patient °Benefits discussed included, but were not limited to decreased muscle tightness, increased joint range of motion, and decreased pain.  Risk discussed included, but were not limited pain and discomfort, bleeding, bruising, excessive weakness, venous thrombosis, muscle atrophy and dysphagia.  Anticipated outcomes of the procedure as well as he risks and benefits of the alternatives to the procedure, and the roles and tasks of the personnel to be involved, were discussed with the patient, and the patient consents to the procedure and agrees to proceed. A copy of the patient medication guide was given to the patient which explains the blackbox warning. ° °Patients identity and treatment sites confirmed Yes.  . ° °Details of Procedure: °Skin was cleaned with alcohol. Prior to injection, the needle plunger was aspirated to make sure the needle was not within a blood vessel.  There was no blood retrieved on aspiration.   ° °Following is a summary of the muscles injected  And the amount of Botulinum toxin used: ° °Dilution °200 units of Botox was reconstituted with 4 ml of preservative free normal saline. °Time of reconstitution: At the time of the office visit (<30 minutes prior to injection)  ° °Injections  °155 total units of Botox was injected with a 30 gauge needle. ° °Injection Sites: °L occipitalis: 15 units- 3 sites  °R occiptalis: 15 units- 3 sites ° °L upper trapezius: 15 units- 3 sites °R upper trapezius: 15 units- 3 sits          °L paraspinal: 10 units- 2 sites °R paraspinal: 10 units- 2 sites ° °Face °L frontalis(2 injection sites):10 units   °R frontalis(2 injection sites):10 units         °L corrugator: 5 units   °R corrugator: 5 units           °Procerus: 5 units   °L temporalis: 20 units °R temporalis: 20 units  ° °Agent:  °200 units of botulinum Type  A (Onobotulinum Toxin type A) was reconstituted with 4 ml of preservative free normal saline.  °Time of reconstitution: At the time of the office visit (<30 minutes prior to injection)  ° ° ° Total injected (Units):  155 ° Total wasted (Units):  45 ° °Patient tolerated procedure well without complications.   °Reinjection is anticipated in 3 months. ° ° °

## 2023-07-19 ENCOUNTER — Ambulatory Visit: Payer: BC Managed Care – PPO | Admitting: Neurology

## 2023-07-19 DIAGNOSIS — G8918 Other acute postprocedural pain: Secondary | ICD-10-CM | POA: Diagnosis not present

## 2023-07-19 DIAGNOSIS — M5414 Radiculopathy, thoracic region: Secondary | ICD-10-CM | POA: Diagnosis not present

## 2023-07-19 DIAGNOSIS — G43E11 Chronic migraine with aura, intractable, with status migrainosus: Secondary | ICD-10-CM | POA: Diagnosis not present

## 2023-07-19 DIAGNOSIS — M961 Postlaminectomy syndrome, not elsewhere classified: Secondary | ICD-10-CM | POA: Diagnosis not present

## 2023-07-19 MED ORDER — ONABOTULINUMTOXINA 100 UNITS IJ SOLR
200.0000 [IU] | Freq: Once | INTRAMUSCULAR | Status: AC
Start: 2023-07-19 — End: 2023-07-19
  Administered 2023-07-19: 155 [IU] via INTRAMUSCULAR

## 2023-07-19 NOTE — Addendum Note (Signed)
Addended by: Leida Lauth on: 07/19/2023 09:41 AM   Modules accepted: Orders

## 2023-08-02 DIAGNOSIS — T8149XA Infection following a procedure, other surgical site, initial encounter: Secondary | ICD-10-CM | POA: Diagnosis not present

## 2023-08-02 DIAGNOSIS — M961 Postlaminectomy syndrome, not elsewhere classified: Secondary | ICD-10-CM | POA: Diagnosis not present

## 2023-08-03 DIAGNOSIS — Z720 Tobacco use: Secondary | ICD-10-CM | POA: Diagnosis not present

## 2023-08-03 DIAGNOSIS — Z6841 Body Mass Index (BMI) 40.0 and over, adult: Secondary | ICD-10-CM | POA: Diagnosis not present

## 2023-08-03 DIAGNOSIS — Z5181 Encounter for therapeutic drug level monitoring: Secondary | ICD-10-CM | POA: Diagnosis not present

## 2023-08-03 DIAGNOSIS — M51369 Other intervertebral disc degeneration, lumbar region without mention of lumbar back pain or lower extremity pain: Secondary | ICD-10-CM | POA: Diagnosis not present

## 2023-08-03 DIAGNOSIS — I493 Ventricular premature depolarization: Secondary | ICD-10-CM | POA: Diagnosis not present

## 2023-08-03 DIAGNOSIS — G43909 Migraine, unspecified, not intractable, without status migrainosus: Secondary | ICD-10-CM | POA: Diagnosis not present

## 2023-08-03 DIAGNOSIS — T8149XA Infection following a procedure, other surgical site, initial encounter: Secondary | ICD-10-CM | POA: Diagnosis not present

## 2023-08-03 DIAGNOSIS — R000 Tachycardia, unspecified: Secondary | ICD-10-CM | POA: Diagnosis not present

## 2023-08-03 DIAGNOSIS — F32A Depression, unspecified: Secondary | ICD-10-CM | POA: Diagnosis not present

## 2023-08-03 DIAGNOSIS — L089 Local infection of the skin and subcutaneous tissue, unspecified: Secondary | ICD-10-CM | POA: Diagnosis not present

## 2023-08-03 DIAGNOSIS — G894 Chronic pain syndrome: Secondary | ICD-10-CM | POA: Diagnosis not present

## 2023-08-03 DIAGNOSIS — T8142XA Infection following a procedure, deep incisional surgical site, initial encounter: Secondary | ICD-10-CM | POA: Diagnosis not present

## 2023-08-03 DIAGNOSIS — T85734A Infection and inflammatory reaction due to implanted electronic neurostimulator, generator, initial encounter: Secondary | ICD-10-CM | POA: Diagnosis not present

## 2023-08-03 DIAGNOSIS — Z7409 Other reduced mobility: Secondary | ICD-10-CM | POA: Diagnosis not present

## 2023-08-03 DIAGNOSIS — R21 Rash and other nonspecific skin eruption: Secondary | ICD-10-CM | POA: Diagnosis not present

## 2023-08-03 DIAGNOSIS — M549 Dorsalgia, unspecified: Secondary | ICD-10-CM | POA: Diagnosis not present

## 2023-08-03 DIAGNOSIS — E876 Hypokalemia: Secondary | ICD-10-CM | POA: Diagnosis not present

## 2023-08-03 DIAGNOSIS — M47816 Spondylosis without myelopathy or radiculopathy, lumbar region: Secondary | ICD-10-CM | POA: Diagnosis not present

## 2023-08-03 DIAGNOSIS — A419 Sepsis, unspecified organism: Secondary | ICD-10-CM | POA: Diagnosis not present

## 2023-08-03 DIAGNOSIS — D649 Anemia, unspecified: Secondary | ICD-10-CM | POA: Diagnosis not present

## 2023-08-03 DIAGNOSIS — T85733A Infection and inflammatory reaction due to implanted electronic neurostimulator of spinal cord, electrode (lead), initial encounter: Secondary | ICD-10-CM | POA: Diagnosis not present

## 2023-08-03 DIAGNOSIS — E039 Hypothyroidism, unspecified: Secondary | ICD-10-CM | POA: Diagnosis not present

## 2023-08-03 DIAGNOSIS — L03312 Cellulitis of back [any part except buttock]: Secondary | ICD-10-CM | POA: Diagnosis not present

## 2023-08-03 DIAGNOSIS — L0390 Cellulitis, unspecified: Secondary | ICD-10-CM | POA: Diagnosis not present

## 2023-08-03 DIAGNOSIS — B9561 Methicillin susceptible Staphylococcus aureus infection as the cause of diseases classified elsewhere: Secondary | ICD-10-CM | POA: Diagnosis not present

## 2023-08-03 DIAGNOSIS — E66813 Obesity, class 3: Secondary | ICD-10-CM | POA: Diagnosis not present

## 2023-08-04 DIAGNOSIS — T8149XA Infection following a procedure, other surgical site, initial encounter: Secondary | ICD-10-CM | POA: Insufficient documentation

## 2023-08-04 DIAGNOSIS — E876 Hypokalemia: Secondary | ICD-10-CM | POA: Insufficient documentation

## 2023-08-12 DIAGNOSIS — T85733A Infection and inflammatory reaction due to implanted electronic neurostimulator of spinal cord, electrode (lead), initial encounter: Secondary | ICD-10-CM | POA: Diagnosis not present

## 2023-08-13 DIAGNOSIS — T85733A Infection and inflammatory reaction due to implanted electronic neurostimulator of spinal cord, electrode (lead), initial encounter: Secondary | ICD-10-CM | POA: Diagnosis not present

## 2023-08-14 DIAGNOSIS — T85733A Infection and inflammatory reaction due to implanted electronic neurostimulator of spinal cord, electrode (lead), initial encounter: Secondary | ICD-10-CM | POA: Diagnosis not present

## 2023-08-15 DIAGNOSIS — T85733A Infection and inflammatory reaction due to implanted electronic neurostimulator of spinal cord, electrode (lead), initial encounter: Secondary | ICD-10-CM | POA: Diagnosis not present

## 2023-08-16 DIAGNOSIS — T85733A Infection and inflammatory reaction due to implanted electronic neurostimulator of spinal cord, electrode (lead), initial encounter: Secondary | ICD-10-CM | POA: Diagnosis not present

## 2023-08-17 DIAGNOSIS — T85733A Infection and inflammatory reaction due to implanted electronic neurostimulator of spinal cord, electrode (lead), initial encounter: Secondary | ICD-10-CM | POA: Diagnosis not present

## 2023-08-18 ENCOUNTER — Inpatient Hospital Stay: Payer: BC Managed Care – PPO | Admitting: Internal Medicine

## 2023-08-18 DIAGNOSIS — I119 Hypertensive heart disease without heart failure: Secondary | ICD-10-CM | POA: Diagnosis not present

## 2023-08-18 DIAGNOSIS — T85733A Infection and inflammatory reaction due to implanted electronic neurostimulator of spinal cord, electrode (lead), initial encounter: Secondary | ICD-10-CM | POA: Diagnosis not present

## 2023-08-18 DIAGNOSIS — D649 Anemia, unspecified: Secondary | ICD-10-CM | POA: Diagnosis not present

## 2023-08-19 DIAGNOSIS — T85733A Infection and inflammatory reaction due to implanted electronic neurostimulator of spinal cord, electrode (lead), initial encounter: Secondary | ICD-10-CM | POA: Diagnosis not present

## 2023-08-19 DIAGNOSIS — T85733D Infection and inflammatory reaction due to implanted electronic neurostimulator of spinal cord, electrode (lead), subsequent encounter: Secondary | ICD-10-CM | POA: Diagnosis not present

## 2023-08-19 DIAGNOSIS — Z09 Encounter for follow-up examination after completed treatment for conditions other than malignant neoplasm: Secondary | ICD-10-CM | POA: Diagnosis not present

## 2023-08-20 DIAGNOSIS — T85733A Infection and inflammatory reaction due to implanted electronic neurostimulator of spinal cord, electrode (lead), initial encounter: Secondary | ICD-10-CM | POA: Diagnosis not present

## 2023-08-21 DIAGNOSIS — T85733A Infection and inflammatory reaction due to implanted electronic neurostimulator of spinal cord, electrode (lead), initial encounter: Secondary | ICD-10-CM | POA: Diagnosis not present

## 2023-08-22 ENCOUNTER — Telehealth: Payer: Self-pay | Admitting: Family Medicine

## 2023-08-22 DIAGNOSIS — T85733A Infection and inflammatory reaction due to implanted electronic neurostimulator of spinal cord, electrode (lead), initial encounter: Secondary | ICD-10-CM | POA: Diagnosis not present

## 2023-08-22 NOTE — Telephone Encounter (Signed)
No contact attempted by telephone

## 2023-08-23 DIAGNOSIS — T85733A Infection and inflammatory reaction due to implanted electronic neurostimulator of spinal cord, electrode (lead), initial encounter: Secondary | ICD-10-CM | POA: Diagnosis not present

## 2023-08-24 DIAGNOSIS — T85733A Infection and inflammatory reaction due to implanted electronic neurostimulator of spinal cord, electrode (lead), initial encounter: Secondary | ICD-10-CM | POA: Diagnosis not present

## 2023-08-25 DIAGNOSIS — M5414 Radiculopathy, thoracic region: Secondary | ICD-10-CM | POA: Diagnosis not present

## 2023-08-25 DIAGNOSIS — T8149XA Infection following a procedure, other surgical site, initial encounter: Secondary | ICD-10-CM | POA: Diagnosis not present

## 2023-08-25 DIAGNOSIS — M961 Postlaminectomy syndrome, not elsewhere classified: Secondary | ICD-10-CM | POA: Diagnosis not present

## 2023-08-25 DIAGNOSIS — T85733A Infection and inflammatory reaction due to implanted electronic neurostimulator of spinal cord, electrode (lead), initial encounter: Secondary | ICD-10-CM | POA: Diagnosis not present

## 2023-08-25 DIAGNOSIS — G8918 Other acute postprocedural pain: Secondary | ICD-10-CM | POA: Diagnosis not present

## 2023-08-26 DIAGNOSIS — T85733A Infection and inflammatory reaction due to implanted electronic neurostimulator of spinal cord, electrode (lead), initial encounter: Secondary | ICD-10-CM | POA: Diagnosis not present

## 2023-08-27 ENCOUNTER — Ambulatory Visit: Payer: BC Managed Care – PPO | Admitting: Family Medicine

## 2023-08-27 DIAGNOSIS — T8463XA Infection and inflammatory reaction due to internal fixation device of spine, initial encounter: Secondary | ICD-10-CM | POA: Diagnosis not present

## 2023-08-27 DIAGNOSIS — T85733A Infection and inflammatory reaction due to implanted electronic neurostimulator of spinal cord, electrode (lead), initial encounter: Secondary | ICD-10-CM | POA: Diagnosis not present

## 2023-08-28 DIAGNOSIS — T85733A Infection and inflammatory reaction due to implanted electronic neurostimulator of spinal cord, electrode (lead), initial encounter: Secondary | ICD-10-CM | POA: Diagnosis not present

## 2023-08-29 DIAGNOSIS — T85733A Infection and inflammatory reaction due to implanted electronic neurostimulator of spinal cord, electrode (lead), initial encounter: Secondary | ICD-10-CM | POA: Diagnosis not present

## 2023-08-30 DIAGNOSIS — T85733A Infection and inflammatory reaction due to implanted electronic neurostimulator of spinal cord, electrode (lead), initial encounter: Secondary | ICD-10-CM | POA: Diagnosis not present

## 2023-08-31 DIAGNOSIS — T85733A Infection and inflammatory reaction due to implanted electronic neurostimulator of spinal cord, electrode (lead), initial encounter: Secondary | ICD-10-CM | POA: Diagnosis not present

## 2023-09-01 DIAGNOSIS — T85733A Infection and inflammatory reaction due to implanted electronic neurostimulator of spinal cord, electrode (lead), initial encounter: Secondary | ICD-10-CM | POA: Diagnosis not present

## 2023-09-02 DIAGNOSIS — T85733A Infection and inflammatory reaction due to implanted electronic neurostimulator of spinal cord, electrode (lead), initial encounter: Secondary | ICD-10-CM | POA: Diagnosis not present

## 2023-09-17 ENCOUNTER — Ambulatory Visit (INDEPENDENT_AMBULATORY_CARE_PROVIDER_SITE_OTHER): Payer: BC Managed Care – PPO | Admitting: Family Medicine

## 2023-09-17 ENCOUNTER — Encounter: Payer: Self-pay | Admitting: Family Medicine

## 2023-09-17 VITALS — BP 126/84 | HR 90 | Ht 62.0 in | Wt 198.0 lb

## 2023-09-17 DIAGNOSIS — M5416 Radiculopathy, lumbar region: Secondary | ICD-10-CM

## 2023-09-17 DIAGNOSIS — E039 Hypothyroidism, unspecified: Secondary | ICD-10-CM

## 2023-09-17 DIAGNOSIS — G43E11 Chronic migraine with aura, intractable, with status migrainosus: Secondary | ICD-10-CM | POA: Diagnosis not present

## 2023-09-17 DIAGNOSIS — D508 Other iron deficiency anemias: Secondary | ICD-10-CM

## 2023-09-17 DIAGNOSIS — R4589 Other symptoms and signs involving emotional state: Secondary | ICD-10-CM

## 2023-09-17 DIAGNOSIS — J4520 Mild intermittent asthma, uncomplicated: Secondary | ICD-10-CM

## 2023-09-17 MED ORDER — LEVOTHYROXINE SODIUM 75 MCG PO TABS: 75.0000 ug | ORAL_TABLET | Freq: Every day | ORAL | 0 refills | Status: DC

## 2023-09-17 NOTE — Patient Instructions (Addendum)
F/U in 3 monhts, call if you need me sooner  TSH, chem 7 , cBC, needs to be  drawn end January   90 day supply is sent to [facility] are referred to therapy  Thanks for choosing [facility], we consider it a privelige to serve you.

## 2023-09-17 NOTE — Assessment & Plan Note (Signed)
Check CBC 

## 2023-09-17 NOTE — Progress Notes (Unsigned)
   Alejandra Barr     MRN: [id number]      DOB: 30-Oct-1986  Chief Complaint  Patient presents with   Follow-up    Follow up recent back surgery with sepsis     HPI Alejandra Barr is here for follow up and re-evaluation of chronic medical conditions, medication management and review of any available recent lab and radiology data.  Preventive health is updated, specifically  Cancer screening and Immunization. Current at this time  The month of October was spent in the hospital due to sepsis following spine surgery . Surgery was successful as far as pain was concerned , unfortunately however , she developed sepsis as a complication. She will  met with neurosurgeon to discuss additional surgery for her chronic debilitating back pain, but undesirably has a lot of fear and anxiety about any interventions as she has had several complications. On the other hand her pain is real and debilitating , it prevents her from being the mother she would like to be in terms of physical activity Is interested in therapy, as she has bad and good days and has a lot on her plate really. PHQ 9 and GAD scores are good at visit, bUT, therapy will only benefit her and is indicated ROS Denies recent fever or chills. C/o food has a bad taste following her prolonged antibiotic course , as a result she has lost a significant amount owf weight and has been on no medication for weight loss Denies sinus pressure, nasal congestion, ear pain or sore throat. Denies chest congestion, productive cough or wheezing. Denies chest pains, palpitations and leg swelling Denies abdominal pain, nausea, vomiting,diarrhea or constipation.   Denies dysuria, frequency, hesitancy or incontinence.  Denies uncontrolled headaches, seizures, numbness, or tingling.  Denies skin break down or rash.   PE  BP 126/84 (BP Location: Right Arm, Patient Position: Sitting, Cuff Size: Large)   Pulse 90   Ht 5\' 2"  (1.575 m)   Wt 198 lb (89.8 kg)   SpO2  97%   BMI 36.21 kg/m   Patient alert and oriented and in no cardiopulmonary distress.  HEENT: No facial asymmetry, EOMI,     Neck supple .  Chest: Clear to auscultation bilaterally.  CVS: S1, S2 no murmurs, no S3.Regular rate.  ABD: Soft non tender.   Ext: No edema  MS: Decreased  ROM lumbar spine,    Skin: Intact, no ulcerations or rash noted.Surgical scars are well healed  Psych: Good eye contact, normal affect. Memory intact not anxious at times mildly  depressed appearing.  CNS: CN 2-12 intact, power,  normal throughout.no focal deficits noted.   Assessment & Plan  No problem-specific Assessment & Plan notes found for this encounter.

## 2023-09-17 NOTE — Assessment & Plan Note (Signed)
Updated lab needed at/ before next visit. Refill sent for 90 day of current dose

## 2023-09-17 NOTE — Assessment & Plan Note (Signed)
Well controlled on medication from Neurology

## 2023-09-20 ENCOUNTER — Encounter: Payer: Self-pay | Admitting: Family Medicine

## 2023-09-20 DIAGNOSIS — R4589 Other symptoms and signs involving emotional state: Secondary | ICD-10-CM | POA: Insufficient documentation

## 2023-09-20 NOTE — Assessment & Plan Note (Signed)
Unchanged severe pain managed through pain management , considering surgery in 2025, has upcoming Neurosurgery consult

## 2023-09-20 NOTE — Assessment & Plan Note (Signed)
Requires no prophylactic medication and only uses prn albuterol , often no more frequent than once evey 6 t 8 weeks

## 2023-09-20 NOTE — Assessment & Plan Note (Signed)
Chronic back pain unresolved despite several attempts to correct this, the most recent resulting in sepsis from wond infection Anxiety about future pain management , probable repeat surgery, mother of 4 young children who she wants to be present in the moment with, refer therapy , some days her anxiety is ecessive and she does have episodes of depression also will benefit from therapy and wants htis

## 2023-09-20 NOTE — Assessment & Plan Note (Signed)
Improved mainly due to recent severe illness  Patient re-educated about  the importance of commitment to a  minimum of 150 minutes of exercise per week as able.  The importance of healthy food choices with portion control discussed, as well as eating regularly and within a 12 hour window most days. The need to choose "clean , green" food 50 to 75% of the time is discussed, as well as to make water the primary drink and set a goal of 64 ounces water daily.       09/17/2023    8:21 AM 07/06/2023    1:16 PM 05/11/2023    4:28 PM  Weight /BMI  Weight 198 lb 242 lb 12.8 oz 218 lb 0.6 oz  Height 5\' 2"  (1.575 m) 5\' 2"  (1.575 m) 5\' 1"  (1.549 m)  BMI 36.21 kg/m2 44.41 kg/m2 41.2 kg/m2    No medication at this time

## 2023-09-22 DIAGNOSIS — M961 Postlaminectomy syndrome, not elsewhere classified: Secondary | ICD-10-CM | POA: Diagnosis not present

## 2023-09-22 DIAGNOSIS — T8149XA Infection following a procedure, other surgical site, initial encounter: Secondary | ICD-10-CM | POA: Diagnosis not present

## 2023-09-22 DIAGNOSIS — M5414 Radiculopathy, thoracic region: Secondary | ICD-10-CM | POA: Diagnosis not present

## 2023-09-27 NOTE — Progress Notes (Unsigned)
NEUROLOGY FOLLOW UP OFFICE NOTE  Alejandra Barr [id number]  Assessment/Plan:   Chronic migraine without aura, without status migrainosus, not intractable Postlaminectomy syndrome   Migraine prevention:  Botox every 3 months; topiramate 100mg  twice daily Migraine rescue:  Ubrelvy 100mg .  Zofran for nausea.   Limit use of pain relievers to no more than 2 days out of week to prevent risk of rebound or medication-overuse headache. Keep headache diary Follow up in 6 months for routine visit.    Subjective:  Alejandra Barr is a 37 year old female with hypothyroidism, asthma, depression, anxiety and history of a flutter s/p RFA (2004) who follows up for migraines.  UPDATE: She had complication with the spinal stimulator that was inserted on 9/9.  It became infected and she was hospitalized for sepsis in October.  It has been removed.  She sees neurosurgery again on 12/3.  A paddle stimulator was suggested but she does not think she will proceed.    Status post 2 rounds since restarting Botox.  Despite everything she has been through, migraines have still significantly improved. Intensity:  severe Duration:  3 hours with Bernita Raisin Frequency:  2 days a week (down from daily)  Current NSAIDS/analgesics:  acetaminophen, hydromorphone (back pain), lidocaine patch (back pain) Current triptans:  none Current ergotamine:  none Current anti-emetic:  none Current muscle relaxants:  Flexeril 5mg  TID PRN Current Antihypertensive medications:  metoprolol succinate 25mg  BID Current Antidepressant medications:  duloxetine 30mg  BID Current Anticonvulsant medications:  topiramate 100mg  BID, gabapentin 300mg  BID Current anti-CGRP:  Ubrelvy 100mg   Current Vitamins/Herbal/Supplements:  B12, D Current Antihistamines/Decongestants:  none Other therapy:  none Birth control:  none Other medications:  Synthroid   Caffeine:  No coffee.  BC powder Alcohol:  no Smoker:  former smoker Diet:  40 oz  water daily.  Drinks diet green tea.  Tries not to skip meals.   Exercise:  PT twice a week s/p back surgeries.  Limited mobility.   Pain:  Post-laminectomy syndrome.  2 failed back surgeries.  Waiting on spinal nerve stimulator. Depression:  difficult due to pain preventing her from playing with her children; Anxiety:  no Sleep hygiene:  wakes up frequently related to back pain  HISTORY: Onset:  12-50 years old.  Worse when she turned 29.  Location:  usually right hemicrania Quality:  pounding, throbbing, sharp Intensity:  severe.   Aura:  absent Prodrome:  absent Associated symptoms:  phonophobia, photophobia, nausea, vomiting.  She denies associated unilateral numbness or weakness. Duration:  4-5 hours, 3 hours with Bernita Raisin Frequency:  Prior to Botox: daily; On Botox: 1 to 2 days a week Triggers:  menses/hormonal Relieving factors:  rest in dark and quiet room Activity:  aggravates Frequency of analgesics:  BC powder 3 times daily (she is avoiding to take Bernita Raisin because she is unsure how it interacts with her pain medications)  She missed her last two doses of Botox because her previous neurologist retired.  Last Botox was in Botox.  Migraines are daily again.  Eye exams unremarkable  Remote MRI of brain on 05/23/2006 personally reviewed was limited by motion but overall unremarkable.    History of chronic neck and back pain radiating into both legs.  MRI of cervical spine on 07/01/2021 personally reviewed was unremarkable.  Found to have a a meningioma in the caudal thoracic spine.  Underwent T11 laminectomy and tumor resection in November 2023.  Continued to have left leg pain and numbness.  MRI of lumbar  spine revealed small left subarticular disc protgrusion at L4-5 causing narrowing in the left lateral recess .  Underwent minimally invasive L4-5 discectomy in March 2024.    Past NSAIDS/analgesics:  Fioricet, ibuprofen, BC powder Past abortive triptans:  rizatriptan, sumatriptan  tab Past abortive ergotamine:  none Past muscle relaxants:  none Past anti-emetic:  Reglan Past antihypertensive medications:  propranolol Past antidepressant medications:  Nortriptyline, amitriptyline, venlafaxine, Wellbutrin Past anticonvulsant medications:  Depakote Past anti-CGRP:  none Past vitamins/Herbal/Supplements:  CoQ10, magnesium oxide Past antihistamines/decongestants:  none Other past therapies:  Botox (effective), trigger point injections   Family history of headache:  mother (migraines), father (headaches)  PAST MEDICAL HISTORY: Past Medical History:  Diagnosis Date   Asthma    Atrial flutter (HCC)    Status post RFA by Dr. 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     albuterol (VENTOLIN HFA) 108 (90 Base) MCG/ACT inhaler Inhale 2 puffs into the lungs every 6 (six) hours as needed for wheezing or shortness of breath. 1 each 0   Aspirin-Caffeine 845-65 MG PACK Take 1 Package by mouth 3 (three) times daily as needed (headache/pain). 56 each    botulinum toxin Type A (BOTOX) 200 units injection Inject 155 units IM into  multiple site in the face,neck and head once every 90 days 1 each 4   Botulinum Toxin Type A (BOTOX) 200 units SOLR 155 Units See admin instructions. INJECT 155 UNITS EVERY THREE MONTHS FOR CHRONIC MIGRAINES.     cyanocobalamin (VITAMIN B12) 1000 MCG/ML injection Inject 1 mL (1,000 mcg total) into the muscle 2 (two) times a week. 10 mL 10   cyclobenzaprine (FLEXERIL) 5 MG tablet Take 5 mg by mouth 3 (three) times daily as needed for muscle spasms.     DULoxetine (CYMBALTA) 30 MG capsule Take 30 mg by mouth 2 (two) times daily.     gabapentin (NEURONTIN) 300 MG capsule Take 300 mg by mouth 2 (two) times daily.     iron polysaccharides (FERREX 150) 150 MG capsule Take 1 capsule (150 mg total) by mouth daily. 30 capsule 1   Iron Sucrose (VENOFER IV) Inject 1 Dose into the vein every 3 (three) months.     levothyroxine (SYNTHROID) 75 MCG tablet Take 1 tablet (75 mcg total) by mouth daily before breakfast. 90 tablet 0   Lidocaine (ZTLIDO) 1.8 % PTCH Place 1 patch onto the skin every 12 (twelve) hours as needed (pain).     metoprolol succinate (TOPROL-XL) 25 MG 24 hr tablet Take 2 tablets (  50 mg total) by mouth daily. 180 tablet 3   NEEDLE, DISP, 25 G 25G X 1-1/2" MISC To be used with B 12 injections twice weekly 20 each 2   ondansetron (ZOFRAN) 4 MG tablet Take 1 tablet (4 mg total) by mouth every 8 (eight) hours as needed for nausea or vomiting. 20 tablet 11   Prenatal Vit-Fe Fumarate-FA (PRENATAL PO) Take 1 tablet by mouth daily.     topiramate (TOPAMAX) 100 MG tablet Take 1 tablet (100 mg total) by mouth 2 (two) times daily. 180 tablet 3   UBRELVY 100 MG TABS Take 1 tablet (100 mg total) by mouth as needed. May repeat after 2 hours.  Maximum 2 tablets in 24 hours. 16 tablet 11   Vitamin D, Ergocalciferol, (DRISDOL) 1.25 MG (50000 UNIT) CAPS capsule Take 1 capsule (50,000 Units total) by mouth once a week. 5 capsule 11   No current facility-administered medications on file prior to visit.      ALLERGIES: Allergies  Allergen Reactions   Ciprofloxacin Anaphylaxis   Morphine And Codeine Anaphylaxis    Pt states she can tolerate hydromorphone   Penicillins Anaphylaxis    Has patient had a PCN reaction causing immediate rash, facial/tongue/throat swelling, SOB or lightheadedness with hypotension: No Has patient had a PCN reaction causing severe rash involving mucus membranes or skin necrosis: No Has patient had a PCN reaction that required hospitalization: No Has patient had a PCN reaction occurring within the last 10 years: No If all of the above answers are "NO", then may proceed with Cephalosporin use.    FAMILY HISTORY: Family History  Problem Relation Age of Onset   Migraines Mother    Fibromyalgia Mother        Chronic pelvic pain    Hypothyroidism Mother    Obesity Mother    Heart defect Mother        Supraventricular, tachycardia /tachypalpation    Arrhythmia Mother    Breast cancer Mother 93   Stroke Maternal Grandmother    Hypertension Maternal Grandmother    Diabetes Maternal Grandmother    Hypertension Maternal Grandfather    Heart disease Paternal Grandmother    Hypertension Paternal Grandmother    Heart disease Paternal Grandfather    Hypertension Paternal Grandfather       Objective:  Blood pressure 93/64, pulse 84, height 5\' 1"  (1.549 m), weight 205 lb 6.4 oz (93.2 kg), SpO2 97%. General: No acute distress.  Patient appears well-groomed.   Head:  Normocephalic/atraumatic Eyes:  Fundi examined but not visualized Neck: supple, no paraspinal tenderness, full range of motion Heart:  Regular rate and rhythm Neurological Exam: alert and oriented.  Speech fluent and not dysarthric, language intact.  CN II-XII intact. Bulk and tone normal, muscle strength 5/5 throughout.  Sensation to light touch intact.  Deep tendon reflexes 2+ throughout, toes downgoing.  Finger to nose testing intact.  Gait normal, Romberg negative.   Shon Millet, DO  CC:  [name], MD

## 2023-09-28 ENCOUNTER — Ambulatory Visit (INDEPENDENT_AMBULATORY_CARE_PROVIDER_SITE_OTHER): Payer: BC Managed Care – PPO | Admitting: Neurology

## 2023-09-28 ENCOUNTER — Encounter: Payer: Self-pay | Admitting: Neurology

## 2023-09-28 VITALS — BP 93/64 | HR 84 | Ht 61.0 in | Wt 205.4 lb

## 2023-09-28 DIAGNOSIS — G43E11 Chronic migraine with aura, intractable, with status migrainosus: Secondary | ICD-10-CM

## 2023-09-28 NOTE — Patient Instructions (Signed)
Follow up for next Botox Continue topiramate and Bernita Raisin

## 2023-10-05 ENCOUNTER — Inpatient Hospital Stay: Payer: BC Managed Care – PPO

## 2023-10-07 ENCOUNTER — Inpatient Hospital Stay: Payer: BC Managed Care – PPO | Attending: Hematology

## 2023-10-07 DIAGNOSIS — E538 Deficiency of other specified B group vitamins: Secondary | ICD-10-CM | POA: Insufficient documentation

## 2023-10-07 DIAGNOSIS — Z9884 Bariatric surgery status: Secondary | ICD-10-CM | POA: Diagnosis not present

## 2023-10-07 DIAGNOSIS — D500 Iron deficiency anemia secondary to blood loss (chronic): Secondary | ICD-10-CM | POA: Diagnosis not present

## 2023-10-07 DIAGNOSIS — R7989 Other specified abnormal findings of blood chemistry: Secondary | ICD-10-CM

## 2023-10-07 DIAGNOSIS — N920 Excessive and frequent menstruation with regular cycle: Secondary | ICD-10-CM | POA: Insufficient documentation

## 2023-10-07 DIAGNOSIS — E559 Vitamin D deficiency, unspecified: Secondary | ICD-10-CM | POA: Diagnosis not present

## 2023-10-07 DIAGNOSIS — D508 Other iron deficiency anemias: Secondary | ICD-10-CM

## 2023-10-07 LAB — CBC WITH DIFFERENTIAL/PLATELET
Abs Immature Granulocytes: 0.03 10*3/uL (ref 0.00–0.07)
Basophils Absolute: 0.1 10*3/uL (ref 0.0–0.1)
Basophils Relative: 1 %
Eosinophils Absolute: 0.4 10*3/uL (ref 0.0–0.5)
Eosinophils Relative: 7 %
HCT: 37.9 % (ref 36.0–46.0)
Hemoglobin: 12.2 g/dL (ref 12.0–15.0)
Immature Granulocytes: 1 %
Lymphocytes Relative: 30 %
Lymphs Abs: 1.6 10*3/uL (ref 0.7–4.0)
MCH: 28.5 pg (ref 26.0–34.0)
MCHC: 32.2 g/dL (ref 30.0–36.0)
MCV: 88.6 fL (ref 80.0–100.0)
Monocytes Absolute: 0.4 10*3/uL (ref 0.1–1.0)
Monocytes Relative: 7 %
Neutro Abs: 2.8 10*3/uL (ref 1.7–7.7)
Neutrophils Relative %: 54 %
Platelets: 246 10*3/uL (ref 150–400)
RBC: 4.28 MIL/uL (ref 3.87–5.11)
RDW: 14.1 % (ref 11.5–15.5)
WBC: 5.2 10*3/uL (ref 4.0–10.5)
nRBC: 0 % (ref 0.0–0.2)

## 2023-10-07 LAB — FOLATE: Folate: 8.1 ng/mL (ref >5.9)

## 2023-10-07 LAB — FERRITIN: Ferritin: 77 ng/mL (ref 11–307)

## 2023-10-07 LAB — VITAMIN D 25 HYDROXY (VIT D DEFICIENCY, FRACTURES): Vit D, 25-Hydroxy: 21.36 ng/mL — ABNORMAL LOW (ref 30–100)

## 2023-10-07 LAB — VITAMIN B12: Vitamin B-12: 206 pg/mL (ref 180–914)

## 2023-10-07 LAB — IRON AND TIBC
Iron: 50 ug/dL (ref 28–170)
Saturation Ratios: 16 % (ref 10.4–31.8)
TIBC: 310 ug/dL (ref 250–450)
UIBC: 260 ug/dL

## 2023-10-10 NOTE — Progress Notes (Unsigned)
VIRTUAL VISIT via TELEPHONE NOTE [facility]   I connected with Alejandra Barr  on 10/11/23 at  3:33 PM by telephone and verified that I am speaking with the correct person using two identifiers.  Location: Patient: Home Provider: [facility]   I discussed the limitations, risks, security and privacy concerns of performing an evaluation and management service by telephone and the availability of in person appointments. I also discussed with the patient that there may be a patient responsible charge related to this service. The patient expressed understanding and agreed to proceed.  REASON FOR VISIT: Iron deficiency anemia and B12 deficiency   CURRENT THERAPY: Intermittent IV iron  INTERVAL HISTORY:  [name] is contacted today for follow-up of iron deficiency anemia and B12 deficiency.  She was last seen by Rojelio Brenner PA-C on 07/06/2023.   At today's visit, she reports feeling fair with moderate fatigue in addition to ongoing back pain s/p spinal cord stimulator placement on 07/12/2023.  Hospitalized for over a week a in October due to sepsis and epidural infection, treated with 4 weeks of IV antibiotics.  She is off of antibiotics and has been released by Infectious Disease.  She continues to have menorrhagia every 2-3 weeks after giving birth to her daughter in April 2022, has not yet spoken with her GYN regarding this.  She denies bright red blood per rectum or melena.   She denies any pica, restless legs, chest pain, dyspnea on exertion, lightheadedness, or syncope.   She has chronic migraine headaches and some tingling in her feet.   She is continuing to take her daily iron supplements, weekly folic acid supplement, sublingual B12 daily/twice weekly B12 injections, and weekly vitamin D supplements.  She has 25% energy and 60% appetite. She endorses that she is maintaining a stable weight.   REVIEW OF SYSTEMS:   Review of Systems   Constitutional:  Positive for malaise/fatigue. Negative for chills, diaphoresis, fever and weight loss.  Respiratory:  Positive for cough. Negative for shortness of breath.   Cardiovascular:  Negative for chest pain and palpitations.  Gastrointestinal:  Negative for abdominal pain, blood in stool, melena, nausea and vomiting.  Musculoskeletal:  Positive for back pain.  Neurological:  Positive for tingling. Negative for dizziness and headaches.  Psychiatric/Behavioral:  The patient has insomnia.      PHYSICAL EXAM: (per limitations of virtual telephone visit)  The patient is alert and oriented x 3, exhibiting adequate mentation, good mood, and ability to speak in full sentences and execute sound judgement.  ASSESSMENT & PLAN:  1.  Iron deficiency anemia: - History of gastric bypass surgery - Iron deficiency anemia is caused by menorrhagia and malabsorption  -- Most recent IV iron with Venofer 400 mg x 1 on 04/09/2023 - Symptoms improved after IV iron  - Most recent labs (10/07/2023): Hgb 12.2/MCV 88.6, ferritin 77, iron saturation 16% - Currently taking prenatal vitamin and ferrous sulfate 325 mg daily - PLAN: Recommend Venofer 400 mg x 2  - Continue oral iron tablet once daily. - Recommend GYN follow-up for menorrhagia - RTC in 3 months with repeat labs. (Patient prefers follow-up every 3 months due to sudden drops in her iron).  Prefers phone visits, but she is agreeable to annual in person visit    2.  Vitamin B12 deficiency: - Currently taking B12 injection twice weekly as well as 500 mcg sublingual OTC B12 daily  - Most recent B12 (10/07/2023) level  marginal at 206, normal MMA - PLAN: Increase sublingual vitamin B12 to 1000 mcg daily. - Continue B12 injections to TWICE WEEKLY.   Recheck B12/MMA at next visit   3.  Vitamin D deficiency: - Vitamin D (06/24/2023) low at 25.98, but improved from previous (18.6 in May 2024) - Vitamin D 50,000 units weekly was restarted in May 2024 -  Most recent vitamin D (10/07/2023) remains low at 21.36.   - PLAN: Continue vitamin D once a week (refill sent to pharmacy).   - Recommend that she start taking 2000 units daily in addition to weekly vitamin D - Recheck vitamin D in 3 months    4.  Folic acid deficiency: - Folate checked on 10/07/2023 normal at 8.1 - Currently taking folic acid 1 mg daily     - PLAN: Continue folic acid supplement.  Recheck folic acid in 6 months (June 2025)   PLAN SUMMARY: >> Venofer 400 mg x 2 >> Labs in 3 months = CBC/D, ferritin, iron/TIBC, vitamin D, B12, MMA  >> PHONE visit in 3 months (1 week after labs)   ** Last office visit 07/06/2023     I discussed the assessment and treatment plan with the patient. The patient was provided an opportunity to ask questions and all were answered. The patient agreed with the plan and demonstrated an understanding of the instructions.   The patient was advised to call back or seek an in-person evaluation if the symptoms worsen or if the condition fails to improve as anticipated.  I provided 22 minutes of non-face-to-face time during this encounter.  Carnella Guadalajara, PA-C 10/11/23 10:13 PM

## 2023-10-11 ENCOUNTER — Inpatient Hospital Stay: Payer: BC Managed Care – PPO | Admitting: Physician Assistant

## 2023-10-11 DIAGNOSIS — D508 Other iron deficiency anemias: Secondary | ICD-10-CM | POA: Diagnosis not present

## 2023-10-11 DIAGNOSIS — E538 Deficiency of other specified B group vitamins: Secondary | ICD-10-CM | POA: Diagnosis not present

## 2023-10-11 DIAGNOSIS — E559 Vitamin D deficiency, unspecified: Secondary | ICD-10-CM | POA: Diagnosis not present

## 2023-10-11 DIAGNOSIS — D500 Iron deficiency anemia secondary to blood loss (chronic): Secondary | ICD-10-CM

## 2023-10-11 DIAGNOSIS — R7989 Other specified abnormal findings of blood chemistry: Secondary | ICD-10-CM

## 2023-10-11 LAB — METHYLMALONIC ACID, SERUM: Methylmalonic Acid, Quantitative: 140 nmol/L (ref 0–378)

## 2023-10-11 MED ORDER — VITAMIN D (ERGOCALCIFEROL) 1.25 MG (50000 UNIT) PO CAPS: 50000.0000 [IU] | ORAL_CAPSULE | ORAL | 11 refills | Status: DC

## 2023-10-11 MED ORDER — VITAMIN D 50 MCG (2000 UT) PO CAPS: 1.0000 | ORAL_CAPSULE | Freq: Every day | ORAL | 3 refills | Status: DC

## 2023-10-12 DIAGNOSIS — Z09 Encounter for follow-up examination after completed treatment for conditions other than malignant neoplasm: Secondary | ICD-10-CM | POA: Diagnosis not present

## 2023-10-12 DIAGNOSIS — D321 Benign neoplasm of spinal meninges: Secondary | ICD-10-CM | POA: Diagnosis not present

## 2023-10-12 DIAGNOSIS — G43E11 Chronic migraine with aura, intractable, with status migrainosus: Secondary | ICD-10-CM | POA: Diagnosis not present

## 2023-10-12 DIAGNOSIS — G43719 Chronic migraine without aura, intractable, without status migrainosus: Secondary | ICD-10-CM | POA: Diagnosis not present

## 2023-10-12 DIAGNOSIS — T85733D Infection and inflammatory reaction due to implanted electronic neurostimulator of spinal cord, electrode (lead), subsequent encounter: Secondary | ICD-10-CM | POA: Diagnosis not present

## 2023-10-12 DIAGNOSIS — G43711 Chronic migraine without aura, intractable, with status migrainosus: Secondary | ICD-10-CM | POA: Diagnosis not present

## 2023-10-12 DIAGNOSIS — M5136 Other intervertebral disc degeneration, lumbar region with discogenic back pain only: Secondary | ICD-10-CM | POA: Diagnosis not present

## 2023-10-15 ENCOUNTER — Ambulatory Visit (INDEPENDENT_AMBULATORY_CARE_PROVIDER_SITE_OTHER): Payer: BC Managed Care – PPO | Admitting: Neurology

## 2023-10-15 ENCOUNTER — Telehealth: Payer: Self-pay | Admitting: Neurology

## 2023-10-15 DIAGNOSIS — G43E11 Chronic migraine with aura, intractable, with status migrainosus: Secondary | ICD-10-CM

## 2023-10-15 MED ORDER — ONABOTULINUMTOXINA 100 UNITS IJ SOLR
200.0000 [IU] | Freq: Once | INTRAMUSCULAR | Status: AC
  Administered 2023-10-15: 155 [IU] via INTRAMUSCULAR

## 2023-10-15 NOTE — Progress Notes (Signed)

## 2023-10-15 NOTE — Telephone Encounter (Signed)
Patient states her insurance is no longer covering her Bernita Raisin.  Will provide her samples of Nurtec and she will contact me with update

## 2023-10-18 ENCOUNTER — Encounter: Payer: Self-pay | Admitting: Family Medicine

## 2023-10-21 ENCOUNTER — Inpatient Hospital Stay: Payer: BC Managed Care – PPO

## 2023-10-21 VITALS — BP 116/68 | HR 93 | Temp 97.4°F | Resp 18 | Ht 62.0 in | Wt 195.4 lb

## 2023-10-21 DIAGNOSIS — E559 Vitamin D deficiency, unspecified: Secondary | ICD-10-CM | POA: Diagnosis not present

## 2023-10-21 DIAGNOSIS — Z9884 Bariatric surgery status: Secondary | ICD-10-CM | POA: Diagnosis not present

## 2023-10-21 DIAGNOSIS — E538 Deficiency of other specified B group vitamins: Secondary | ICD-10-CM | POA: Diagnosis not present

## 2023-10-21 DIAGNOSIS — D500 Iron deficiency anemia secondary to blood loss (chronic): Secondary | ICD-10-CM | POA: Diagnosis not present

## 2023-10-21 DIAGNOSIS — D508 Other iron deficiency anemias: Secondary | ICD-10-CM

## 2023-10-21 DIAGNOSIS — N920 Excessive and frequent menstruation with regular cycle: Secondary | ICD-10-CM | POA: Diagnosis not present

## 2023-10-21 MED ORDER — ACETAMINOPHEN 325 MG PO TABS
650.0000 mg | ORAL_TABLET | Freq: Once | ORAL | Status: DC
Start: 2023-10-21 — End: 2023-10-21

## 2023-10-21 MED ORDER — SODIUM CHLORIDE 0.9 % IV SOLN: INTRAVENOUS | Status: DC

## 2023-10-21 MED ORDER — CETIRIZINE HCL 10 MG PO TABS
10.0000 mg | ORAL_TABLET | Freq: Once | ORAL | Status: DC
Start: 2023-10-21 — End: 2023-10-21

## 2023-10-21 MED ORDER — SODIUM CHLORIDE 0.9 % IV SOLN
400.0000 mg | Freq: Once | INTRAVENOUS | Status: AC
  Administered 2023-10-21: 400 mg via INTRAVENOUS
  Filled 2023-10-21: qty 400

## 2023-10-21 NOTE — Patient Instructions (Signed)
Iron Sucrose Injection What is this medication? IRON SUCROSE (EYE ern SOO krose) treats low levels of iron (iron deficiency anemia) in people with kidney disease. Iron is a mineral that plays an important role in making red blood cells, which carry oxygen from your lungs to the rest of your body. This medicine may be used for other purposes; ask your health care provider or pharmacist if you have questions. COMMON BRAND NAME(S): Venofer What should I tell my care team before I take this medication? They need to know if you have any of these conditions: Anemia not caused by low iron levels Heart disease High levels of iron in the blood Kidney disease Liver disease An unusual or allergic reaction to iron, other medications, foods, dyes, or preservatives Pregnant or trying to get pregnant Breastfeeding How should I use this medication? This medication is for infusion into a vein. It is given in a hospital or clinic setting. Talk to your care team about the use of this medication in children. While this medication may be prescribed for children as young as 2 years for selected conditions, precautions do apply. Overdosage: If you think you have taken too much of this medicine contact a poison control center or emergency room at once. NOTE: This medicine is only for you. Do not share this medicine with others. What if I miss a dose? Keep appointments for follow-up doses. It is important not to miss your dose. Call your care team if you are unable to keep an appointment. What may interact with this medication? Do not take this medication with any of the following: Deferoxamine Dimercaprol Other iron products This medication may also interact with the following: Chloramphenicol Deferasirox This list may not describe all possible interactions. Give your health care provider a list of all the medicines, herbs, non-prescription drugs, or dietary supplements you use. Also tell them if you smoke,  drink alcohol, or use illegal drugs. Some items may interact with your medicine. What should I watch for while using this medication? Visit your care team regularly. Tell your care team if your symptoms do not start to get better or if they get worse. You may need blood work done while you are taking this medication. You may need to follow a special diet. Talk to your care team. Foods that contain iron include: whole grains/cereals, dried fruits, beans, or peas, leafy green vegetables, and organ meats (liver, kidney). What side effects may I notice from receiving this medication? Side effects that you should report to your care team as soon as possible: Allergic reactions--skin rash, itching, hives, swelling of the face, lips, tongue, or throat Low blood pressure--dizziness, feeling faint or lightheaded, blurry vision Shortness of breath Side effects that usually do not require medical attention (report to your care team if they continue or are bothersome): Flushing Headache Joint pain Muscle pain Nausea Pain, redness, or irritation at injection site This list may not describe all possible side effects. Call your doctor for medical advice about side effects. You may report side effects to FDA at 1-800-FDA-1088. Where should I keep my medication? This medication is given in a hospital or clinic. It will not be stored at home. NOTE: This sheet is a summary. It may not cover all possible information. If you have questions about this medicine, talk to your doctor, pharmacist, or health care provider.  2024 Elsevier/Gold Standard (2023-03-26 00:00:00)

## 2023-10-21 NOTE — Progress Notes (Signed)
Patient tolerated iron infusion with no complaints voiced.  Peripheral IV site clean and dry with good blood return noted before and after infusion.  Band aid applied. Pt did not want to wait the full 30 minutes post iron infusion. Pt observed for 15 minutes without any complications. VSS with discharge and left in satisfactory condition with no s/s of distress noted. All follow ups as scheduled.   Kathye Cipriani [employer]

## 2023-10-28 ENCOUNTER — Telehealth: Payer: Self-pay | Admitting: Pharmacy Technician

## 2023-10-28 NOTE — Telephone Encounter (Signed)
Pharmacy Patient Advocate Encounter   Received notification from Pt Calls Messages that prior authorization for Ubrelvy 100MG  tablets is required/requested.   Insurance verification completed.   The patient is insured through [facility] .   Per test claim: PA required; PA submitted to above mentioned insurance via CoverMyMeds Key/confirmation #/[id number] [facility] Status is pending

## 2023-10-28 NOTE — Telephone Encounter (Signed)
PA request has been Submitted. New Encounter created for follow up. For additional info see Pharmacy Prior Auth telephone encounter from 10/28/2023.

## 2023-10-29 ENCOUNTER — Other Ambulatory Visit: Payer: Self-pay | Admitting: Family Medicine

## 2023-10-29 ENCOUNTER — Inpatient Hospital Stay: Payer: BC Managed Care – PPO

## 2023-10-29 ENCOUNTER — Encounter: Payer: Self-pay | Admitting: Hematology

## 2023-10-29 VITALS — BP 114/79 | HR 84 | Temp 97.5°F | Resp 18

## 2023-10-29 DIAGNOSIS — Z9884 Bariatric surgery status: Secondary | ICD-10-CM | POA: Diagnosis not present

## 2023-10-29 DIAGNOSIS — E559 Vitamin D deficiency, unspecified: Secondary | ICD-10-CM | POA: Diagnosis not present

## 2023-10-29 DIAGNOSIS — N920 Excessive and frequent menstruation with regular cycle: Secondary | ICD-10-CM | POA: Diagnosis not present

## 2023-10-29 DIAGNOSIS — D508 Other iron deficiency anemias: Secondary | ICD-10-CM

## 2023-10-29 DIAGNOSIS — D500 Iron deficiency anemia secondary to blood loss (chronic): Secondary | ICD-10-CM | POA: Diagnosis not present

## 2023-10-29 DIAGNOSIS — E538 Deficiency of other specified B group vitamins: Secondary | ICD-10-CM | POA: Diagnosis not present

## 2023-10-29 DIAGNOSIS — G8929 Other chronic pain: Secondary | ICD-10-CM

## 2023-10-29 MED ORDER — SODIUM CHLORIDE 0.9 % IV SOLN
400.0000 mg | Freq: Once | INTRAVENOUS | Status: AC
  Administered 2023-10-29: 400 mg via INTRAVENOUS
  Filled 2023-10-29: qty 400

## 2023-10-29 MED ORDER — SODIUM CHLORIDE 0.9 % IV SOLN: Freq: Once | INTRAVENOUS | Status: AC

## 2023-10-29 NOTE — Patient Instructions (Signed)
 [facility] [place] - A DEPT OF [facility]  Discharge Instructions: Thank you for choosing [facility] to provide your oncology and hematology care.  If you have a lab appointment with the Cancer Center - please note that after April 8th, 2024, all labs will be drawn in the cancer center.  You do not have to check in or register with the main entrance as you have in the past but will complete your check-in in the cancer center.  Wear comfortable clothing and clothing appropriate for easy access to any Portacath or PICC line.   We strive to give you quality time with your provider. You may need to reschedule your appointment if you arrive late (15 or more minutes).  Arriving late affects you and other patients whose appointments are after yours.  Also, if you miss three or more appointments without notifying the office, you may be dismissed from the clinic at the provider's discretion.      For prescription refill requests, have your pharmacy contact our office and allow 72 hours for refills to be completed.    Today you received the following chemotherapy and/or immunotherapy agents Venofer      To help prevent nausea and vomiting after your treatment, we encourage you to take your nausea medication as directed.  BELOW ARE SYMPTOMS THAT SHOULD BE REPORTED IMMEDIATELY: *FEVER GREATER THAN 100.4 F (38 C) OR HIGHER *CHILLS OR SWEATING *NAUSEA AND VOMITING THAT IS NOT CONTROLLED WITH YOUR NAUSEA MEDICATION *UNUSUAL SHORTNESS OF BREATH *UNUSUAL BRUISING OR BLEEDING *URINARY PROBLEMS (pain or burning when urinating, or frequent urination) *BOWEL PROBLEMS (unusual diarrhea, constipation, pain near the anus) TENDERNESS IN MOUTH AND THROAT WITH OR WITHOUT PRESENCE OF ULCERS (sore throat, sores in mouth, or a toothache) UNUSUAL RASH, SWELLING OR PAIN  UNUSUAL VAGINAL DISCHARGE OR ITCHING   Items with * indicate a potential emergency and should be followed up  as soon as possible or go to the Emergency Department if any problems should occur.  Please show the CHEMOTHERAPY ALERT CARD or IMMUNOTHERAPY ALERT CARD at check-in to the Emergency Department and triage nurse.  Should you have questions after your visit or need to cancel or reschedule your appointment, please contact [facility] [place] - A DEPT OF [facility] [phone]  and follow the prompts.  Office hours are 8:00 a.m. to 4:30 p.m. Monday - Friday. Please note that voicemails left after 4:00 p.m. may not be returned until the following business day.  We are closed weekends and major holidays. You have access to a nurse at all times for urgent questions. Please call the main number to the clinic [phone] and follow the prompts.  For any non-urgent questions, you may also contact your provider using MyChart. We now offer e-Visits for anyone 63 and older to request care online for non-urgent symptoms. For details visit [email or web address].   Also download the MyChart app! Go to the app store, search "MyChart", open the app, select [facility], and log in with your MyChart username and password.

## 2023-10-29 NOTE — Progress Notes (Signed)
Patient presents today for Venofer infusion per providers order.  Vital signs WNL.  Patient has no new complaints at this time. Patient took premedications at home.   Peripheral IV started and blood return noted pre and post infusion.    Treatment given today per MD orders.  Stable during infusion without adverse affects.  Vital signs stable.  No complaints at this time.  Discharge from clinic ambulatory in stable condition.  Alert and oriented X 3.  Follow up with [facility] as scheduled.

## 2023-10-30 DIAGNOSIS — D321 Benign neoplasm of spinal meninges: Secondary | ICD-10-CM | POA: Diagnosis not present

## 2023-11-11 NOTE — Progress Notes (Signed)
  Intake history:  BP 119/83   Pulse 90   Ht 5' 2 (1.575 m)   Wt 195 lb (88.5 kg)   BMI 35.67 kg/m  Body mass index is 35.67 kg/m.    WHAT ARE WE SEEING YOU FOR TODAY?   right knee(s)  How long has this bothered you?   6 weeks  Anticoag.  No  Diabetes No  Heart disease No  Hypertension No  SMOKING HX former   Kidney disease No  Any ALLERGIES ______________________________________________   Treatment:  Have you taken:  Tylenol  Yes  Advil  Yes  Had PT No  Had injection No  Other  ____Takes dilaudid  for spine_____________________

## 2023-11-15 ENCOUNTER — Ambulatory Visit: Payer: Medicaid Other | Admitting: Orthopedic Surgery

## 2023-11-15 ENCOUNTER — Encounter: Payer: Self-pay | Admitting: Hematology

## 2023-11-15 VITALS — BP 119/83 | HR 90 | Ht 62.0 in | Wt 195.0 lb

## 2023-11-15 DIAGNOSIS — G8929 Other chronic pain: Secondary | ICD-10-CM

## 2023-11-15 DIAGNOSIS — M25561 Pain in right knee: Secondary | ICD-10-CM

## 2023-11-15 NOTE — Patient Instructions (Signed)
 Call [phone] to schedule your physical therapy

## 2023-11-15 NOTE — Progress Notes (Signed)
 Office Visit Note   Patient: Alejandra Barr           Date of Birth: 1986-01-04           MRN: [id number] Visit Date: 11/15/2023 Requested by: Antonetta Rollene BRAVO, MD [address], [address] [place],  [place] [address] PCP: Antonetta Rollene BRAVO, MD   Assessment & Plan:   Encounter Diagnosis  Name Primary?   Chronic pain of right knee Yes    No orders of the defined types were placed in this encounter.   Recommend physical therapy right knee if no improvement MRI right knee to rule out intra-articular pathology   Subjective: Chief Complaint  Patient presents with   Hip Pain    Bilateral- pain started 6 weeks ago has had multiple back surgeries got septic from spine stimulator   Intake history:   BP 119/83   Pulse 90   Ht 5' 2 (1.575 m)   Wt 195 lb (88.5 kg)   BMI 35.67 kg/m  Body mass index is 35.67 kg/m.       WHAT ARE WE SEEING YOU FOR TODAY?    right knee(s)   How long has this bothered you?    6 weeks   Anticoag.  No   Diabetes No   Heart disease No   Hypertension No   SMOKING HX former    Kidney disease No   Any ALLERGIES ______________________________________________     Treatment:  Have you taken:  Tylenol  Yes  Advil  Yes  Had PT No  Had injection No  Other  ____Takes dilaudid  for spine_____________________      HPI: 38 year old female with bilateral hip pain with some radiation into the groin on the right side but primarily lateral and lower back presents with medial right knee pain on ambulation and sometimes at night with some popping in the right knee negative x-ray no improvement with anti-inflammatories              ROS: Primarily related to her back bilateral hip pain as stated which is really pain in the lower back   Images personally read and my interpretation : First image was an AP pelvis taken in 23 hips were normal next 1 is a right knee x-ray which was also normal and no arthritis seen  Visit Diagnoses:  1.  Chronic pain of right knee      Follow-Up Instructions: Return in about 6 weeks (around 12/27/2023) for FOLLOW UP, RIGHT, KNEE.    Objective: Vital Signs: BP 119/83   Pulse 90   Ht 5' 2 (1.575 m)   Wt 195 lb (88.5 kg)   BMI 35.67 kg/m   Physical Exam Vitals and nursing note reviewed.  Constitutional:      Appearance: Normal appearance.  HENT:     Head: Normocephalic and atraumatic.  Eyes:     General: No scleral icterus.       Right eye: No discharge.        Left eye: No discharge.     Extraocular Movements: Extraocular movements intact.     Conjunctiva/sclera: Conjunctivae normal.     Pupils: Pupils are equal, round, and reactive to light.  Cardiovascular:     Rate and Rhythm: Normal rate.     Pulses: Normal pulses.  Skin:    General: Skin is warm and dry.     Capillary Refill: Capillary refill takes less than 2 seconds.  Neurological:     General: No focal deficit  present.     Mental Status: She is alert and oriented to person, place, and time.  Psychiatric:        Mood and Affect: Mood normal.        Behavior: Behavior normal.        Thought Content: Thought content normal.        Judgment: Judgment normal.      Right Knee Exam  Right knee exam is normal.   Left Knee Exam  Left knee exam is normal.   Right Hip Exam  Right hip exam is normal.    Left Hip Exam  Left hip exam is normal.      Specialty Comments:  No specialty comments available.  Imaging: No results found.   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   Depression, major,  single episode, severe (HCC) 06/28/2022   Right hip pain 03/08/2022   Right knee pain 03/08/2022   Lump of axilla, left 03/05/2022   FH: breast cancer 03/05/2022   COVID-19 10/20/2021   Neck pain 06/19/2021   Lumbar back pain with radiculopathy affecting right lower extremity 06/19/2021   Acute on chronic anemia 03/03/2021   Hypothyroidism 03/02/2021   GDM, class A2 03/02/2021   History of postpartum hemorrhage, delayed 03/01/2021   RLTCS 4/30 03/01/2021   Abdominal pain 10/16/2013   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    Iron  deficiency    Iron  deficiency anemia 10/02/2010   Qualifier: Diagnosis of  By: Antonetta MD, Margaret     Low vitamin B12 level 07/10/2013   Overview:  Last Assessment & Plan:  Being treated through hematology   Newborn product of in vitro fertilization (IVF) pregnancy    Obesity    PONV (postoperative nausea and vomiting)     Family History  Problem Relation Age of Onset   Migraines Mother    Fibromyalgia Mother        Chronic pelvic pain    Hypothyroidism Mother    Obesity Mother    Heart defect Mother        Supraventricular, tachycardia /tachypalpation    Arrhythmia Mother    Breast cancer Mother 44   Stroke Maternal Grandmother    Hypertension Maternal Grandmother    Diabetes Maternal  Grandmother    Hypertension Maternal Grandfather    Heart disease Paternal Grandmother    Hypertension Paternal  Grandmother    Heart disease Paternal Grandfather    Hypertension Paternal Grandfather     Past Surgical History:  Procedure Laterality Date   Bilateral eustachian tube placement on 4 different occasions     CARDIAC ELECTROPHYSIOLOGY MAPPING AND ABLATION  2004   for aflutter/afib   CARPAL TUNNEL RELEASE Bilateral 05/2018   CESAREAN SECTION N/A 05/26/2017   Procedure: CESAREAN SECTION;  Surgeon: Dannielle Bouchard, DO;  [facility];  Service: Obstetrics;  Laterality: N/A;   CESAREAN SECTION N/A 03/01/2021   Procedure: Repeat CESAREAN SECTION;  Surgeon: Gorge Ade, MD;  Location: [facility];  Service: Obstetrics;  Laterality: N/A;  EDD: 03/07/21   CESAREAN SECTION MULTI-GESTATIONAL N/A 02/10/2019   Procedure: Repeat CESAREAN SECTION MULTI-GESTATIONAL;  Surgeon: Gorge Ade, MD;  Location: [facility];  Service: Obstetrics;  Laterality: N/A;  EDD: 03/15/19 Allergy: Penicillin, Morphine , Cipro   CHOLECYSTECTOMY  2003   COLONOSCOPY N/A 11/29/2013   Procedure: COLONOSCOPY;  Surgeon: Claudis RAYMOND Rivet, MD;  [facility]: [facility];  Service: Endoscopy;  Laterality: N/A;  120-rescheduled to 300 Ann notified pt   GASTRIC BYPASS  2006   LAMINECTOMY N/A 09/10/2022   Procedure: 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;  Surgeon: Cheryle Debby LABOR, MD;  Location: [facility] OR;  Service: Neurosurgery;  Laterality: N/A;  RM 20 to follow   LUMBAR LAMINECTOMY/ DECOMPRESSION WITH MET-RX Left 01/21/2023   Procedure: Left Lumbar Four-Five MIS Discectomy;  Surgeon: Cheryle Debby LABOR, MD;  Location: [facility];  Service: Neurosurgery;  Laterality: Left;   TONSILLECTOMY  1990   Social History   Occupational History   Occupation: Producer, Television/film/video: [employer]  Tobacco Use   Smoking status: Former    Current packs/day: 0.00    Average packs/day: 0.5 packs/day for  2.2 years (1.1 ttl pk-yrs)    Types: Cigarettes    Start date: 03/21/2013    Quit date: 06/02/2015    Years since quitting: 8.4   Smokeless tobacco: Never  Vaping Use   Vaping status: Never Used  Substance and Sexual Activity   Alcohol use: No    Alcohol/week: 0.0 standard drinks of alcohol    Comment: sober alcoholic 6 years   Drug use: No   Sexual activity: Not Currently    Birth control/protection: None    Comment: husband had vasectomy

## 2023-11-15 NOTE — Addendum Note (Signed)
 Addended by: Debby Bud R on: 11/15/2023 02:01 PM   Modules accepted: Orders

## 2023-12-01 ENCOUNTER — Ambulatory Visit: Payer: Medicaid Other | Admitting: Family Medicine

## 2023-12-01 VITALS — BP 127/84 | HR 94 | Ht 62.0 in | Wt 192.1 lb

## 2023-12-01 DIAGNOSIS — E039 Hypothyroidism, unspecified: Secondary | ICD-10-CM | POA: Diagnosis not present

## 2023-12-01 DIAGNOSIS — M25561 Pain in right knee: Secondary | ICD-10-CM

## 2023-12-01 DIAGNOSIS — F4329 Adjustment disorder with other symptoms: Secondary | ICD-10-CM | POA: Diagnosis not present

## 2023-12-01 DIAGNOSIS — G43E11 Chronic migraine with aura, intractable, with status migrainosus: Secondary | ICD-10-CM

## 2023-12-01 DIAGNOSIS — E559 Vitamin D deficiency, unspecified: Secondary | ICD-10-CM | POA: Diagnosis not present

## 2023-12-01 DIAGNOSIS — Z23 Encounter for immunization: Secondary | ICD-10-CM | POA: Diagnosis not present

## 2023-12-01 DIAGNOSIS — M5416 Radiculopathy, lumbar region: Secondary | ICD-10-CM

## 2023-12-01 DIAGNOSIS — G8929 Other chronic pain: Secondary | ICD-10-CM

## 2023-12-01 MED ORDER — SEMAGLUTIDE-WEIGHT MANAGEMENT 0.25 MG/0.5ML ~~LOC~~ SOAJ: 0.2500 mg | SUBCUTANEOUS | 0 refills | Status: DC

## 2023-12-01 MED ORDER — SEMAGLUTIDE-WEIGHT MANAGEMENT 0.5 MG/0.5ML ~~LOC~~ SOAJ: 0.5000 mg | SUBCUTANEOUS | 2 refills | Status: DC

## 2023-12-01 NOTE — Patient Instructions (Addendum)
F/U in  12 weeks, callif you need me sooner  Alejandra Barr is sent to [facility] let us know if a problem  I will check on referral to therapist and get back to you  Pneumonia 20 today  Hold on to hope and keep pushing forward!  Thanks for choosing [facility], we consider it a privelige to serve you.

## 2023-12-02 ENCOUNTER — Encounter (HOSPITAL_COMMUNITY): Payer: Self-pay

## 2023-12-02 ENCOUNTER — Ambulatory Visit (HOSPITAL_COMMUNITY): Payer: Medicaid Other

## 2023-12-02 NOTE — Therapy (Deleted)
OUTPATIENT PHYSICAL THERAPY LOWER EXTREMITY EVALUATION   Patient Name: Alejandra Barr MRN: [id number] DOB:1986/10/18, 38 y.o., female Today's Date: 12/02/2023  END OF SESSION:   Past Medical History:  Diagnosis Date   Asthma    Atrial flutter (HCC)    Status post RFA by Dr. 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    Iron deficiency    Iron deficiency anemia 10/02/2010   Qualifier: Diagnosis of  By: Lodema Hong MD, Margaret     Low vitamin B12 level 07/10/2013   Overview:  Last Assessment & Plan:  Being treated through hematology   Newborn product of in vitro fertilization (IVF) pregnancy    Obesity    PONV (postoperative nausea and vomiting)    Past Surgical History:  Procedure Laterality Date   Bilateral eustachian tube placement on 4 different occasions     CARDIAC ELECTROPHYSIOLOGY MAPPING AND ABLATION  2004   for aflutter/afib   CARPAL TUNNEL RELEASE Bilateral 05/2018   CESAREAN SECTION N/A 05/26/2017   Procedure: CESAREAN SECTION;  Surgeon: Mitchel Honour, DO;  [facility];  Service: Obstetrics;  Laterality: N/A;   CESAREAN SECTION N/A 03/01/2021   Procedure: Repeat CESAREAN SECTION;  Surgeon: Olivia Mackie, MD;  [facility];  Service: Obstetrics;  Laterality: N/A;  EDD: 03/07/21   CESAREAN SECTION MULTI-GESTATIONAL N/A 02/10/2019   Procedure: Repeat CESAREAN SECTION MULTI-GESTATIONAL;  Surgeon: Olivia Mackie, MD;  [facility];  Service: Obstetrics;  Laterality: N/A;  EDD: 03/15/19 Allergy: Penicillin, Morphine, Cipro   CHOLECYSTECTOMY  2003   COLONOSCOPY N/A 11/29/2013   Procedure: COLONOSCOPY;  Surgeon: Malissa Hippo, MD;  [facility];  Service: Endoscopy;  Laterality: N/A;  120-rescheduled to 300 Ann notified pt   GASTRIC BYPASS  2006   LAMINECTOMY N/A 09/10/2022   Procedure: 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;  Surgeon: Jadene Pierini, MD;  Location: [facility] OR;  Service: Neurosurgery;  Laterality: N/A;  RM 20 to follow   LUMBAR LAMINECTOMY/ DECOMPRESSION WITH MET-RX Left 01/21/2023   Procedure: Left Lumbar Four-Five MIS Discectomy;  Surgeon: Jadene Pierini, MD;  Location: [facility];  Service: Neurosurgery;  Laterality: Left;   TONSILLECTOMY  1990   Patient Active Problem List   Diagnosis Date Noted   Anxiety about health 09/20/2023   Hypokalemia 08/04/2023   Surgical site infection 08/04/2023   Spinal meningioma (HCC) 05/18/2023   Chronic migraine with aura, intractable, with status migrainosus 03/07/2023   Radiculopathy of lumbar region 01/21/2023   Symptomatic PVCs 11/24/2022   SOB (shortness of breath) 11/24/2022   Weakness 09/12/2022   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   Depression, major, single episode, severe (HCC) 06/28/2022   Right hip pain 03/08/2022   Right knee pain 03/08/2022   Lump of axilla, left 03/05/2022   FH: breast cancer 03/05/2022   COVID-19 10/20/2021   Neck pain 06/19/2021   Lumbar back pain with radiculopathy affecting right lower extremity 06/19/2021   Acute on chronic anemia 03/03/2021   Hypothyroidism 03/02/2021   GDM, class A2 03/02/2021   History of postpartum hemorrhage, delayed 03/01/2021   RLTCS 4/30 03/01/2021  Abdominal pain 10/16/2013   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  Rationale for Evaluation and Treatment: Rehabilitation  ONSET DATE: ***  SUBJECTIVE:   SUBJECTIVE STATEMENT: ***  PERTINENT HISTORY: *** PAIN:  Are you having pain? Yes: NPRS scale: *** Pain location: *** Pain description: *** Aggravating factors: *** Relieving factors: ***  PRECAUTIONS: None  RED FLAGS: None   WEIGHT BEARING RESTRICTIONS: No  FALLS:  Has patient fallen in last 6 months? {fallsyesno:27318}  PATIENT GOALS: ***  NEXT MD VISIT: ***  OBJECTIVE:  Note: Objective measures were completed at Evaluation unless otherwise noted.  DIAGNOSTIC FINDINGS: ***  PATIENT SURVEYS:  LEFS ***  COGNITION: Overall cognitive status: Within functional limits for tasks assessed     SENSATION: {sensation:27233}   POSTURE: {posture:25561}  PALPATION: ***  LOWER EXTREMITY ROM:  Active ROM Right eval Left eval  Hip flexion    Hip extension    Hip abduction    Hip adduction    Hip internal rotation    Hip external rotation    Knee flexion    Knee extension    Ankle dorsiflexion    Ankle plantarflexion    Ankle inversion    Ankle eversion     (Blank rows = not tested)  LOWER EXTREMITY MMT:  MMT Right eval Left eval  Hip flexion    Hip extension    Hip abduction    Hip adduction    Hip internal rotation    Hip external rotation    Knee flexion    Knee extension    Ankle dorsiflexion    Ankle plantarflexion    Ankle inversion    Ankle eversion     (Blank rows = not tested)  LOWER EXTREMITY SPECIAL TESTS:  {LEspecialtests:26242}  FUNCTIONAL TESTS:  30 seconds chair stand test: *** 2 minute walk test: ***  GAIT: Distance walked: *** Assistive device utilized: {Assistive devices:23999} Level of assistance: {Levels of assistance:24026} Comments: ***                                                                                                                                 TREATMENT DATE:   12/02/2023 PT Evaluation and HEP    PATIENT EDUCATION:  Education details: PT Evaluation, findings, prognosis, frequency, attendance policy, and HEP if given.  Person educated: Patient Education method: Medical illustrator Education comprehension: verbalized understanding  HOME EXERCISE PROGRAM: ***  ASSESSMENT:  CLINICAL IMPRESSION: Patient is a *** y.o. *** who was seen today for physical therapy evaluation and  treatment for M25.561,G89.29 (ICD-10-CM) - Chronic pain of right knee. Pt demonstrating *** due to ***. Pt will benefit from skilled Physical Therapy services to address deficits/limitations in order to improve functional and QOL.    OBJECTIVE IMPAIRMENTS: {opptimpairments:25111}.   ACTIVITY LIMITATIONS: {activitylimitations:27494}  PARTICIPATION LIMITATIONS: {participationrestrictions:25113}  PERSONAL FACTORS: {Personal factors:25162} are also affecting patient's functional outcome.   REHAB POTENTIAL: {rehabpotential:25112}  CLINICAL DECISION MAKING: {clinical decision making:25114}  EVALUATION COMPLEXITY: {Evaluation complexity:25115}   GOALS: Goals reviewed with patient? {yes/no:20286}  SHORT TERM GOALS: Target date: ***  Pt will be independent with HEP in order to demonstrate participation in Physical Therapy POC.  Baseline: Goal status: {GOALSTATUS:25110}  2.  Pt will ***/10 pain during mobility in order to demonstrate improved pain with functional activities.  Baseline:  Goal status: {GOALSTATUS:25110}  LONG TERM GOALS: Target date: ***  Pt will improve 5x STS by *** in order to demonstrate improved functional strength to return to desired activities.  Baseline: *** Goal status: {GOALSTATUS:25110}  2.  Pt will improve 2 MWT by *** in order to demonstrate improved functional ambulatory capacity in community setting.  Baseline: *** Goal status: {GOALSTATUS:25110}  3.  Pt will improve FOTO score by *** in order to  demonstrate improved pain with functional goals and outcomes. Baseline: *** Goal status: {GOALSTATUS:25110}  4.  Pt will improve *** by *** in order to improve *** during functional activities.. Baseline: *** Goal status: {GOALSTATUS:25110}  5.  Pt will improve *** by *** in order to improve *** during functional activities.. Baseline: *** Goal status: {GOALSTATUS:25110}   PLAN:  PT FREQUENCY: {rehab frequency:25116}  PT DURATION: {rehab duration:25117}  PLANNED INTERVENTIONS: {rehab planned interventions:25118::"97110-Therapeutic exercises","97530- Therapeutic [phone]- Neuromuscular re-education","97535- Self JXBJ","47829- Manual therapy"}  PLAN FOR NEXT SESSION: ***   Nelida Meuse, PT 12/02/2023, 9:08 AM

## 2023-12-02 NOTE — Therapy (Signed)
[facility] Rehabilitation at [facility] [address] [place], [place], [address] Phone: [phone]   Fax:  [phone]  Patient Details  Name: Alejandra Barr MRN: [id number] Date of Birth: 29-Jul-1986 Referring Provider:  No ref. provider found  Encounter Date: 12/02/2023  Pt was called regarding no show to evaluation. Pt was informed to call back either to re-schedule or discontinue PT script. Left voicemail.   Nelida Meuse PT, DPT [facility]- [place] 336 [phone] office  Nelida Meuse, PT 12/02/2023, 2:06 PM  [facility] Outpatient Rehabilitation at [facility] [address] [place], [place], [address] Phone: [phone]   Fax:  [phone]

## 2023-12-05 ENCOUNTER — Telehealth: Payer: Self-pay | Admitting: Orthopedic Surgery

## 2023-12-05 NOTE — Telephone Encounter (Signed)
Dr. Mort Sawyers pt - pt lvm after hours on 12/03/23, she stated that Dr. Romeo Apple was sending there to PT, but they can't see her until 12/28/23 which is after her next appointment with Dr. Rexene Edison.  She wants to see if she can be referred to somewhere else. [phone]

## 2023-12-06 DIAGNOSIS — F4329 Adjustment disorder with other symptoms: Secondary | ICD-10-CM | POA: Insufficient documentation

## 2023-12-06 DIAGNOSIS — Z23 Encounter for immunization: Secondary | ICD-10-CM | POA: Insufficient documentation

## 2023-12-06 NOTE — Assessment & Plan Note (Signed)
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

## 2023-12-06 NOTE — Assessment & Plan Note (Signed)
 After obtaining informed consent, the pneumonia 20  vaccine is  administered , with no adverse effect noted at the time of administration.

## 2023-12-06 NOTE — Assessment & Plan Note (Signed)
 Controlled, no change in medication

## 2023-12-06 NOTE — Telephone Encounter (Signed)
We are limited where to send due to Medicaid Maybe [place] it may be closer since she lives in [place] I left message for her to call back  If she calls back will you let me know if she wants [facility]

## 2023-12-06 NOTE — Progress Notes (Signed)
Alejandra Barr     MRN: [id number]      DOB: 03-Mar-1986  Chief Complaint  Patient presents with   Knee Pain    R knee pain seen Dr Romeo Apple he ordered PT     HPI Alejandra Barr is here for follow up and re-evaluation of chronic medical conditions, medication management and review of any available recent lab and radiology data.  Preventive health is updated, specifically  Cancer screening and Immunization.   Now debilitated from uncontrolled back pain, has had surgery complicated by sepsis, has been laid off her job of nearly 12 years and continues to try her best as she raises 4 children who mean the world to her , still waiting on appt for psychotherapy Currently in PT for right knee pain, as ordered / directed by Ortho, may need mRI and definitive management  ROS Denies recent fever or chills. Denies sinus pressure, nasal congestion, ear pain or sore throat. Denies chest congestion, productive cough or wheezing. Denies chest pains, palpitations and leg swelling Denies abdominal pain, nausea, vomiting,diarrhea or constipation.   Denies dysuria, frequency, hesitancy or incontinence. Debilitating back and right knee pain and limitation in mobility. Denies headaches, seizures,  PE  BP 127/84 (BP Location: Right Arm, Patient Position: Sitting, Cuff Size: Large)   Pulse 94   Ht 5\' 2"  (1.575 m)   Wt 192 lb 1.9 oz (87.1 kg)   SpO2 96%   BMI 35.14 kg/m   Patient alert and oriented and in no cardiopulmonary distress.  HEENT: No facial asymmetry, EOMI,     Neck supple .  Chest: Clear to auscultation bilaterally.  CVS: S1, S2 no murmurs, no S3.Regular rate.  ABD: Soft non tender.   Ext: No edema  MS: decreased  ROM  lumbar spine,  Skin: Intact, no ulcerations or rash noted.  Psych: Good eye contact, normal affect. Memory intact not anxious or depressed appearing.  CNS: CN 2-12 intact, power,  normal throughout.no focal deficits noted.   Assessment & Plan  Chronic migraine  with aura, intractable, with status migrainosus Managed by Neurology and controlleed  Hypothyroidism Controlled, no change in medication   Vitamin D deficiency Continue weekly supplement  Morbid obesity (HCC)  Patient re-educated about  the importance of commitment to a  minimum of 150 minutes of exercise per week as able.  The importance of healthy food choices with portion control discussed, as well as eating regularly and within a 12 hour window most days. The need to choose "clean , green" food 50 to 75% of the time is discussed, as well as to make water the primary drink and set a goal of 64 ounces water daily.       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

## 2023-12-06 NOTE — Assessment & Plan Note (Signed)
Continue weekly supplement 

## 2023-12-06 NOTE — Assessment & Plan Note (Signed)
  Patient re-educated about  the importance of commitment to a  minimum of 150 minutes of exercise per week as able.  The importance of healthy food choices with portion control discussed, as well as eating regularly and within a 12 hour window most days. The need to choose "clean , green" food 50 to 75% of the time is discussed, as well as to make water the primary drink and set a goal of 64 ounces water daily.       12/01/2023    2:47 PM 11/15/2023    1:43 PM 10/21/2023    8:01 AM  Weight /BMI  Weight 192 lb 1.9 oz 195 lb 195 lb 6.4 oz  Height 5\' 2"  (1.575 m) 5\' 2"  (1.575 m) 5\' 2"  (1.575 m)  BMI 35.14 kg/m2 35.67 kg/m2 35.74 kg/m2    Start wegovy , .25 to  mg dose in next 3 months and re eval at thatr time

## 2023-12-06 NOTE — Assessment & Plan Note (Signed)
Managed by Neurology and controlleed

## 2023-12-06 NOTE — Assessment & Plan Note (Signed)
Debilitating , now unemployed as a result, additional surgery planned

## 2023-12-06 NOTE — Assessment & Plan Note (Signed)
Managed by ortho and in therapy

## 2023-12-07 ENCOUNTER — Encounter: Payer: BC Managed Care – PPO | Admitting: Internal Medicine

## 2023-12-07 ENCOUNTER — Encounter: Payer: Self-pay | Admitting: Internal Medicine

## 2023-12-07 NOTE — Progress Notes (Signed)
 Erroneous encounter - please disregard.

## 2023-12-14 ENCOUNTER — Other Ambulatory Visit: Payer: Self-pay | Admitting: Family Medicine

## 2023-12-17 NOTE — Telephone Encounter (Signed)
Pharmacy Patient Advocate Encounter  Received notification from [facility] that Prior Authorization for Ubrelvy 100MG  tablets  has been APPROVED from 10/28/2023 to 10/27/2024   PA #/Case ID/Reference #: [id number]

## 2023-12-24 ENCOUNTER — Ambulatory Visit: Payer: BC Managed Care – PPO | Admitting: Family Medicine

## 2023-12-26 NOTE — Therapy (Incomplete)
 OUTPATIENT PHYSICAL THERAPY THORACOLUMBAR EVALUATION   Patient Name: Alejandra Barr MRN: [id number] DOB:1985/12/07, 38 y.o., female Today's Date: 12/26/2023  END OF SESSION:   Past Medical History:  Diagnosis Date   Asthma    Atrial flutter (HCC)    Status post RFA by Dr. 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    Iron deficiency    Iron deficiency anemia 10/02/2010   Qualifier: Diagnosis of  By: Lodema Hong MD, Margaret     Low vitamin B12 level 07/10/2013   Overview:  Last Assessment & Plan:  Being treated through hematology   Newborn product of in vitro fertilization (IVF) pregnancy    Obesity    PONV (postoperative nausea and vomiting)    Past Surgical History:  Procedure Laterality Date   Bilateral eustachian tube placement on 4 different occasions     CARDIAC ELECTROPHYSIOLOGY MAPPING AND ABLATION  2004   for aflutter/afib   CARPAL TUNNEL RELEASE Bilateral 05/2018   CESAREAN SECTION N/A 05/26/2017   Procedure: CESAREAN SECTION;  Surgeon: Mitchel Honour, DO;  [facility];  Service: Obstetrics;  Laterality: N/A;   CESAREAN SECTION N/A 03/01/2021   Procedure: Repeat CESAREAN SECTION;  Surgeon: Olivia Mackie, MD;  [facility];  Service: Obstetrics;  Laterality: N/A;  EDD: 03/07/21   CESAREAN SECTION MULTI-GESTATIONAL N/A 02/10/2019   Procedure: Repeat CESAREAN SECTION MULTI-GESTATIONAL;  Surgeon: Olivia Mackie, MD;  [facility]: [facility];  Service: Obstetrics;  Laterality: N/A;  EDD: 03/15/19 Allergy: Penicillin, Morphine, Cipro   CHOLECYSTECTOMY  2003   COLONOSCOPY N/A 11/29/2013   Procedure: COLONOSCOPY;  Surgeon: Malissa Hippo, MD;  [facility];   Service: Endoscopy;  Laterality: N/A;  120-rescheduled to [address] notified pt   GASTRIC BYPASS  2006   LAMINECTOMY N/A 09/10/2022   Procedure: 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;  Surgeon: Jadene Pierini, MD;  Location: [facility];  Service: Neurosurgery;  Laterality: N/A;  RM 20 to follow   LUMBAR LAMINECTOMY/ DECOMPRESSION WITH MET-RX Left 01/21/2023   Procedure: Left Lumbar Four-Five MIS Discectomy;  Surgeon: Jadene Pierini, MD;  Location: [facility];  Service: Neurosurgery;  Laterality: Left;   TONSILLECTOMY  1990   Patient Active Problem List   Diagnosis Date Noted   ERRONEOUS ENCOUNTER--DISREGARD 12/07/2023   Stress and adjustment reaction 12/06/2023   Encounter for immunization 12/06/2023   Anxiety about health 09/20/2023   Hypokalemia 08/04/2023   Surgical site infection 08/04/2023   Spinal meningioma (HCC) 05/18/2023   Chronic migraine with aura, intractable, with status migrainosus 03/07/2023   Radiculopathy of lumbar region 01/21/2023   Symptomatic PVCs 11/24/2022   SOB (shortness of breath) 11/24/2022   Weakness 09/12/2022   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   Depression, major, single episode, severe (HCC) 06/28/2022   Right hip pain 03/08/2022   Right knee pain 03/08/2022   Lump of axilla, left 03/05/2022   FH: breast cancer 03/05/2022   COVID-19 10/20/2021   Neck pain 06/19/2021   Lumbar back pain with radiculopathy affecting right lower extremity 06/19/2021   Acute on chronic anemia 03/03/2021   Hypothyroidism 03/02/2021   GDM,  class A2 03/02/2021   History of postpartum hemorrhage, delayed 03/01/2021   RLTCS 4/30 03/01/2021   Abdominal pain 10/16/2013   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  ONSET DATE: ***  SUBJECTIVE:                                                                                                                                                                                           SUBJECTIVE STATEMENT: ***  PERTINENT HISTORY:  ***  PAIN:  Are you having pain? {OPRCPAIN:27236}  PRECAUTIONS: {Therapy precautions:24002}  RED FLAGS: {PT Red Flags:29287}   WEIGHT BEARING RESTRICTIONS: {Yes ***/No:24003}  FALLS:  Has patient fallen in last 6 months? {fallsyesno:27318}  LIVING ENVIRONMENT: Lives with: {OPRC lives with:25569::"lives with their family"} Lives in: {Lives in:25570} Stairs: {opstairs:27293} Has following equipment at home: {Assistive devices:23999}  OCCUPATION: ***  PLOF: {PLOF:24004}  PATIENT GOALS: ***  NEXT MD VISIT: ***  OBJECTIVE:  Note: Objective measures were completed at Evaluation unless otherwise noted.  DIAGNOSTIC FINDINGS:  ***  PATIENT SURVEYS:  {rehab surveys:24030}  COGNITION: Overall cognitive status: {cognition:24006}     SENSATION: {sensation:27233}  MUSCLE LENGTH: Hamstrings: Right *** deg; Left *** deg Maisie Fus test: Right *** deg; Left *** deg  POSTURE: {posture:25561}  PALPATION: ***  LUMBAR ROM:   AROM eval  Flexion   Extension   Right lateral flexion   Left lateral flexion   Right rotation   Left rotation    (Blank rows = not tested)  LOWER EXTREMITY ROM:     {AROM/PROM:27142}  Right eval Left eval  Hip flexion    Hip extension    Hip abduction    Hip adduction    Hip internal rotation    Hip external rotation    Knee flexion    Knee extension    Ankle dorsiflexion    Ankle plantarflexion    Ankle inversion    Ankle eversion     (Blank rows = not tested)  LOWER EXTREMITY MMT:    MMT Right eval Left eval  Hip  flexion    Hip extension    Hip abduction    Hip adduction    Hip internal rotation    Hip external rotation    Knee flexion  Knee extension    Ankle dorsiflexion    Ankle plantarflexion    Ankle inversion    Ankle eversion     (Blank rows = not tested)  LUMBAR SPECIAL TESTS:  {lumbar special test:25242}  FUNCTIONAL TESTS:  {Functional tests:24029}  GAIT: Distance walked: *** Assistive device utilized: {Assistive devices:23999} Level of assistance: {Levels of assistance:24026} Comments: ***  TREATMENT DATE: ***                                                                                                                                 PATIENT EDUCATION:  Education details: *** Person educated: {Person educated:25204} Education method: {Education Method:25205} Education comprehension: {Education Comprehension:25206}  HOME EXERCISE PROGRAM: ***  ASSESSMENT:  CLINICAL IMPRESSION: Patient is a *** y.o. *** who was seen today for physical therapy evaluation and treatment for ***.   OBJECTIVE IMPAIRMENTS: {opptimpairments:25111}.   ACTIVITY LIMITATIONS: {activitylimitations:27494}  PARTICIPATION LIMITATIONS: {participationrestrictions:25113}  PERSONAL FACTORS: {Personal factors:25162} are also affecting patient's functional outcome.   REHAB POTENTIAL: {rehabpotential:25112}  CLINICAL DECISION MAKING: {clinical decision making:25114}  EVALUATION COMPLEXITY: {Evaluation complexity:25115}   GOALS: Goals reviewed with patient? {yes/no:20286}  SHORT TERM GOALS: Target date: ***  *** Baseline: Goal status: INITIAL  2.  *** Baseline:  Goal status: INITIAL  3.  *** Baseline:  Goal status: INITIAL  4.  *** Baseline:  Goal status: INITIAL  5.  *** Baseline:  Goal status: INITIAL  6.  *** Baseline:  Goal status: INITIAL  LONG TERM GOALS: Target date: ***  *** Baseline:  Goal status: INITIAL  2.  *** Baseline:  Goal status:  INITIAL  3.  *** Baseline:  Goal status: INITIAL  4.  *** Baseline:  Goal status: INITIAL  5.  *** Baseline:  Goal status: INITIAL  6.  *** Baseline:  Goal status: INITIAL  PLAN:  PT FREQUENCY: {rehab frequency:25116}  PT DURATION: {rehab duration:25117}  PLANNED INTERVENTIONS: {rehab planned interventions:25118::"97110-Therapeutic exercises","97530- Therapeutic [phone]- Neuromuscular re-education","97535- Self UUVO","53664- Manual therapy"}.  PLAN FOR NEXT SESSION: ***  9:20 AM, 12/26/23 Shanikka Wonders Small Raymond Azure MPT [facility] [place] [phone]

## 2023-12-27 ENCOUNTER — Ambulatory Visit: Payer: Medicaid Other | Admitting: Orthopedic Surgery

## 2023-12-28 ENCOUNTER — Other Ambulatory Visit: Payer: Self-pay

## 2023-12-28 ENCOUNTER — Ambulatory Visit (HOSPITAL_COMMUNITY): Payer: Medicaid Other | Attending: Orthopedic Surgery

## 2023-12-28 DIAGNOSIS — M25561 Pain in right knee: Secondary | ICD-10-CM

## 2023-12-28 DIAGNOSIS — R262 Difficulty in walking, not elsewhere classified: Secondary | ICD-10-CM

## 2023-12-28 DIAGNOSIS — G8929 Other chronic pain: Secondary | ICD-10-CM | POA: Insufficient documentation
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
## 2023-12-30 ENCOUNTER — Ambulatory Visit (HOSPITAL_COMMUNITY): Payer: Medicaid Other

## 2023-12-30 ENCOUNTER — Telehealth (HOSPITAL_COMMUNITY): Payer: Self-pay

## 2023-12-30 ENCOUNTER — Encounter (HOSPITAL_COMMUNITY): Payer: Self-pay

## 2023-12-30 NOTE — Telephone Encounter (Signed)
 Called patient today for her 1st no show. Patient reported that she forgot her appointment today. Informed patient about the facility's policies on cancellation/no shows and that she can call if she has questions. Also informed patient about her future appointment.  Tish Frederickson. Tequia Wolman, PT, DPT, OCS Board-Certified Clinical Specialist in Orthopedic PT PT Compact Privilege # ([place]): [phone]

## 2024-01-05 ENCOUNTER — Ambulatory Visit (HOSPITAL_COMMUNITY): Payer: Medicaid Other | Attending: Orthopedic Surgery

## 2024-01-05 DIAGNOSIS — M25561 Pain in right knee: Secondary | ICD-10-CM | POA: Diagnosis present

## 2024-01-05 DIAGNOSIS — R262 Difficulty in walking, not elsewhere classified: Secondary | ICD-10-CM | POA: Diagnosis present
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
## 2024-01-07 ENCOUNTER — Ambulatory Visit (HOSPITAL_COMMUNITY): Payer: Medicaid Other

## 2024-01-07 DIAGNOSIS — R262 Difficulty in walking, not elsewhere classified: Secondary | ICD-10-CM | POA: Diagnosis not present

## 2024-01-07 DIAGNOSIS — M25561 Pain in right knee: Secondary | ICD-10-CM
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
## 2024-01-10 ENCOUNTER — Inpatient Hospital Stay: Payer: Medicaid Other

## 2024-01-10 ENCOUNTER — Other Ambulatory Visit (HOSPITAL_COMMUNITY): Payer: Self-pay

## 2024-01-10 ENCOUNTER — Telehealth: Payer: Self-pay | Admitting: Neurology

## 2024-01-10 ENCOUNTER — Telehealth: Payer: Self-pay | Admitting: Pharmacy Technician

## 2024-01-10 ENCOUNTER — Encounter: Payer: Self-pay | Admitting: Hematology

## 2024-01-10 NOTE — Telephone Encounter (Signed)
 Pharmacy Patient Advocate Encounter   Received notification from Physician's Office that prior authorization for BOTOX 200 is required/requested.   Insurance verification completed.   The patient is insured through [facility] MEDICAID .   Per test claim: PA required; PA submitted to above mentioned insurance via CoverMyMeds Key/confirmation #/EOC [id number] Status is pending

## 2024-01-10 NOTE — Telephone Encounter (Signed)
 [facility] pharmacy called and left a message with the access nurse. She is calling to advise of a delay in shipment for the patient order. The patient's insurance is inactive.

## 2024-01-11 ENCOUNTER — Encounter (HOSPITAL_COMMUNITY): Payer: Medicaid Other

## 2024-01-12 ENCOUNTER — Other Ambulatory Visit (HOSPITAL_COMMUNITY): Payer: Self-pay

## 2024-01-13 ENCOUNTER — Telehealth (HOSPITAL_COMMUNITY): Payer: Self-pay

## 2024-01-13 ENCOUNTER — Encounter (HOSPITAL_COMMUNITY): Payer: Medicaid Other
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
## 2024-01-14 ENCOUNTER — Ambulatory Visit: Payer: BC Managed Care – PPO | Admitting: Neurology

## 2024-01-14 ENCOUNTER — Encounter: Payer: Self-pay | Admitting: Physician Assistant

## 2024-01-17 ENCOUNTER — Other Ambulatory Visit (HOSPITAL_COMMUNITY): Payer: Self-pay

## 2024-01-17 ENCOUNTER — Inpatient Hospital Stay: Payer: Medicaid Other | Admitting: Physician Assistant

## 2024-01-17 NOTE — Telephone Encounter (Signed)
 Pharmacy Patient Advocate Encounter- Botox BIV-Pharmacy Benefit:  PA was submitted to [facility] MEDICAID and has been approved through: 3.10.25 TO 3.10.26 Authorization# [id number]  Please send prescription to Specialty Pharmacy: [facility]: [phone]   Admin Code: [id number]   DOES NOT require Prior Auth.  Patient IS NOT eligible for Botox Copay Card, which will make patient's copay as little as zero. Copay card will be provided to pharmacy.

## 2024-01-18 ENCOUNTER — Encounter (HOSPITAL_COMMUNITY): Payer: Self-pay

## 2024-01-18 ENCOUNTER — Ambulatory Visit (HOSPITAL_COMMUNITY): Payer: Medicaid Other

## 2024-01-18 DIAGNOSIS — R262 Difficulty in walking, not elsewhere classified: Secondary | ICD-10-CM

## 2024-01-18 DIAGNOSIS — M25561 Pain in right knee: Secondary | ICD-10-CM
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
## 2024-01-19 ENCOUNTER — Ambulatory Visit: Admitting: Neurology

## 2024-01-21 ENCOUNTER — Inpatient Hospital Stay

## 2024-01-21 ENCOUNTER — Encounter (HOSPITAL_COMMUNITY): Payer: Self-pay

## 2024-01-21 ENCOUNTER — Ambulatory Visit (HOSPITAL_COMMUNITY): Payer: Medicaid Other

## 2024-01-21 DIAGNOSIS — R262 Difficulty in walking, not elsewhere classified: Secondary | ICD-10-CM | POA: Diagnosis not present

## 2024-01-21 DIAGNOSIS — R7989 Other specified abnormal findings of blood chemistry: Secondary | ICD-10-CM

## 2024-01-21 DIAGNOSIS — E538 Deficiency of other specified B group vitamins: Secondary | ICD-10-CM | POA: Insufficient documentation

## 2024-01-21 DIAGNOSIS — E559 Vitamin D deficiency, unspecified: Secondary | ICD-10-CM | POA: Insufficient documentation

## 2024-01-21 DIAGNOSIS — D500 Iron deficiency anemia secondary to blood loss (chronic): Secondary | ICD-10-CM | POA: Insufficient documentation

## 2024-01-21 DIAGNOSIS — N920 Excessive and frequent menstruation with regular cycle: Secondary | ICD-10-CM | POA: Insufficient documentation

## 2024-01-21 DIAGNOSIS — D508 Other iron deficiency anemias: Secondary | ICD-10-CM

## 2024-01-21 DIAGNOSIS — M25561 Pain in right knee: Secondary | ICD-10-CM

## 2024-01-21 LAB — CBC WITH DIFFERENTIAL/PLATELET
Abs Immature Granulocytes: 0.01 10*3/uL (ref 0.00–0.07)
Basophils Absolute: 0.1 10*3/uL (ref 0.0–0.1)
Basophils Relative: 1 %
Eosinophils Absolute: 0.3 10*3/uL (ref 0.0–0.5)
Eosinophils Relative: 5 %
HCT: 37.9 % (ref 36.0–46.0)
Hemoglobin: 12 g/dL (ref 12.0–15.0)
Immature Granulocytes: 0 %
Lymphocytes Relative: 30 %
Lymphs Abs: 1.6 10*3/uL (ref 0.7–4.0)
MCH: 29 pg (ref 26.0–34.0)
MCHC: 31.7 g/dL (ref 30.0–36.0)
MCV: 91.5 fL (ref 80.0–100.0)
Monocytes Absolute: 0.4 10*3/uL (ref 0.1–1.0)
Monocytes Relative: 7 %
Neutro Abs: 3.1 10*3/uL (ref 1.7–7.7)
Neutrophils Relative %: 57 %
Platelets: 209 10*3/uL (ref 150–400)
RBC: 4.14 MIL/uL (ref 3.87–5.11)
RDW: 13.2 % (ref 11.5–15.5)
WBC: 5.4 10*3/uL (ref 4.0–10.5)
nRBC: 0 % (ref 0.0–0.2)

## 2024-01-21 LAB — IRON AND TIBC
Iron: 73 ug/dL (ref 28–170)
Saturation Ratios: 25 % (ref 10.4–31.8)
TIBC: 298 ug/dL (ref 250–450)
UIBC: 225 ug/dL

## 2024-01-21 LAB — FERRITIN: Ferritin: 134 ng/mL (ref 11–307)

## 2024-01-21 LAB — VITAMIN D 25 HYDROXY (VIT D DEFICIENCY, FRACTURES): Vit D, 25-Hydroxy: 30.31 ng/mL (ref 30–100)

## 2024-01-21 LAB — VITAMIN B12: Vitamin B-12: 163 pg/mL — ABNORMAL LOW (ref 180–914)

## 2024-01-21 NOTE — Progress Notes (Signed)
 VIRTUAL VISIT via TELEPHONE NOTE [facility]   I connected with Alejandra Barr  on 01/24/24 at  1:55 PM by telephone and verified that I am speaking with the correct person using two identifiers.  Location: Patient: Home Provider: [facility]   I discussed the limitations, risks, security and privacy concerns of performing an evaluation and management service by telephone and the availability of in person appointments. I also discussed with the patient that there may be a patient responsible charge related to this service. The patient expressed understanding and agreed to proceed.  REASON FOR VISIT: Iron deficiency anemia and B12 deficiency   CURRENT THERAPY: Intermittent IV iron  INTERVAL HISTORY:  Alejandra Barr is contacted today for follow-up of iron deficiency anemia and B12 deficiency.  She was last evaluated via telemedicine visit with Rojelio Brenner PA-C on 10/11/2023.   She is continuing to have pain and weakness related to spinal stenosis, with surgery in September 2024. She felt some improved energy after IV iron in December 2024.  She continues to have menorrhagia every 2-3 weeks after giving birth to her daughter in April 2022, has not yet spoken with her GYN regarding this. She denies bright red blood per rectum or melena.  She denies any pica, restless legs, chest pain, dyspnea on exertion, lightheadedness, or syncope.  She has chronic migraine headaches and some tingling in her feet.  Due to insurance change, she has not had access to her vitamin B12 injections or high-dose vitamin D since January 2025.  She has 75% energy and 100% appetite. She endorses that she is maintaining a stable weight.   ASSESSMENT & PLAN:  1.  Iron deficiency anemia: - History of gastric bypass surgery - Iron deficiency anemia is caused by menorrhagia and malabsorption  -- Most recent IV iron with Venofer 400 mg x 2 in December 2024 - Symptoms improved  after IV iron - Most recent labs (01/21/2024): Hgb 12.0/MCV 91.5, ferritin 134, iron saturation 25% - Currently taking prenatal vitamin and ferrous sulfate 325 mg daily - PLAN: No indication for IV iron at this time. - Continue oral iron tablet once daily (Rx to pharmacy). - Recommend GYN follow-up for menorrhagia - RTC in 3 months with repeat labs. (Patient prefers follow-up every 3 months due to sudden drops in her iron).  Prefers phone visits, but she is agreeable to annual in person visit    2.  Vitamin B12 deficiency: - Currently taking B12 injection twice weekly as well as 1000 mcg sublingual OTC B12 daily - No access to B12 injections from January 2025 through March 2025 due to insurance changes - Most recent B12 (01/21/2024) is low at 163, MMA pending  - PLAN: Rx to pharmacy for vitamin B12 injections (daily x 7, then twice weekly indefinitely) + sublingual B12 1000 mcg daily.   Recheck B12/MMA at next visit   3.  Vitamin D deficiency: - Vitamin D (06/24/2023) low at 25.98, but improved from previous (18.6 in May 2024) - Vitamin D 50,000 units weekly was restarted in May 2024 with addition of daily vitamin D2 1000 units (as of December 2024) - Most recent vitamin D (01/21/2024) normal at 30.31 - PLAN: Lapse in medication due to insurance change around December 2024.  Rx sent to pharmacy for vitamin D 50,000 units weekly + vitamin D 2000 units daily. - Recheck vitamin D in 3 months   4.  Folic acid deficiency: - Folate checked on  10/07/2023 normal at 8.1 - Currently taking folic acid 1 mg daily     - PLAN: Continue folic acid supplement (Rx sent to pharmacy).  Recheck folic acid around June 2025   PLAN SUMMARY: >> Rx to pharmacy [facility] for... Vitamin B12 injection Sublingual vitamin B-12 Vitamin D 50,000 units weekly Vitamin D 2000 units daily Folic acid 400 mcg daily Ferrous sulfate 325 mg daily  >> Labs in 3 months = CBC/D, ferritin, iron/TIBC, vitamin D, B12, MMA, folate >>  PHONE visit in 3 months (1 week after labs)   ** Last office visit 07/06/2023    REVIEW OF SYSTEMS:   Review of Systems  Constitutional:  Positive for malaise/fatigue. Negative for chills, diaphoresis, fever and weight loss.  Respiratory:  Negative for cough and shortness of breath.   Cardiovascular:  Negative for chest pain and palpitations.  Gastrointestinal:  Negative for abdominal pain, blood in stool, melena, nausea and vomiting.  Musculoskeletal:  Positive for back pain.  Neurological:  Positive for tingling. Negative for dizziness and headaches.  Psychiatric/Behavioral:  The patient has insomnia.     PHYSICAL EXAM: (per limitations of virtual telephone visit)  The patient is alert and oriented x 3, exhibiting adequate mentation, good mood, and ability to speak in full sentences and execute sound judgement.  WRAP UP:   I discussed the assessment and treatment plan with the patient. The patient was provided an opportunity to ask questions and all were answered. The patient agreed with the plan and demonstrated an understanding of the instructions.   The patient was advised to call back or seek an in-person evaluation if the symptoms worsen or if the condition fails to improve as anticipated.  I provided 22 minutes of non-face-to-face time during this encounter, including >10 minutes of medical discussion.  Carnella Guadalajara, PA-C 01/24/24 2:29 PM
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
## 2024-01-24 ENCOUNTER — Inpatient Hospital Stay: Admitting: Physician Assistant

## 2024-01-24 ENCOUNTER — Encounter: Payer: Self-pay | Admitting: Physician Assistant

## 2024-01-24 DIAGNOSIS — D500 Iron deficiency anemia secondary to blood loss (chronic): Secondary | ICD-10-CM

## 2024-01-24 DIAGNOSIS — R7989 Other specified abnormal findings of blood chemistry: Secondary | ICD-10-CM

## 2024-01-24 DIAGNOSIS — E559 Vitamin D deficiency, unspecified: Secondary | ICD-10-CM

## 2024-01-24 DIAGNOSIS — E538 Deficiency of other specified B group vitamins: Secondary | ICD-10-CM

## 2024-01-24 DIAGNOSIS — D508 Other iron deficiency anemias: Secondary | ICD-10-CM

## 2024-01-24 MED ORDER — VITAMIN D 50 MCG (2000 UT) PO CAPS: 1.0000 | ORAL_CAPSULE | Freq: Every day | ORAL | 3 refills | Status: DC

## 2024-01-24 MED ORDER — FERROUS SULFATE 325 (65 FE) MG PO TBEC: 325.0000 mg | DELAYED_RELEASE_TABLET | Freq: Every day | ORAL | 3 refills | Status: AC

## 2024-01-24 MED ORDER — B-12 1000 MCG SL SUBL: 1000.0000 ug | SUBLINGUAL_TABLET | Freq: Every day | SUBLINGUAL | 3 refills | Status: AC

## 2024-01-24 MED ORDER — FOLIC ACID 400 MCG PO TABS: 400.0000 ug | ORAL_TABLET | Freq: Every day | ORAL | 3 refills | Status: AC

## 2024-01-24 MED ORDER — VITAMIN D (ERGOCALCIFEROL) 1.25 MG (50000 UNIT) PO CAPS: 50000.0000 [IU] | ORAL_CAPSULE | ORAL | 11 refills | Status: AC

## 2024-01-24 MED ORDER — CYANOCOBALAMIN 1000 MCG/ML IJ SOLN: INTRAMUSCULAR | 3 refills | Status: AC

## 2024-01-25 ENCOUNTER — Ambulatory Visit (HOSPITAL_COMMUNITY): Payer: Medicaid Other

## 2024-01-25 ENCOUNTER — Encounter (HOSPITAL_COMMUNITY): Payer: Self-pay

## 2024-01-25 DIAGNOSIS — R262 Difficulty in walking, not elsewhere classified: Secondary | ICD-10-CM

## 2024-01-25 DIAGNOSIS — M25561 Pain in right knee: Secondary | ICD-10-CM

## 2024-01-25 LAB — METHYLMALONIC ACID, SERUM: Methylmalonic Acid, Quantitative: 221 nmol/L (ref 0–378)
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
## 2024-01-26 ENCOUNTER — Ambulatory Visit: Admitting: Neurology

## 2024-01-28 ENCOUNTER — Ambulatory Visit (HOSPITAL_COMMUNITY): Payer: Medicaid Other

## 2024-01-28 DIAGNOSIS — R262 Difficulty in walking, not elsewhere classified: Secondary | ICD-10-CM

## 2024-01-28 DIAGNOSIS — M25561 Pain in right knee: Secondary | ICD-10-CM
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
## 2024-01-31 ENCOUNTER — Other Ambulatory Visit (HOSPITAL_COMMUNITY): Payer: Self-pay

## 2024-02-07 ENCOUNTER — Ambulatory Visit: Payer: Medicaid Other | Admitting: Orthopedic Surgery

## 2024-02-07 ENCOUNTER — Encounter: Payer: Self-pay | Admitting: Orthopedic Surgery

## 2024-02-07 DIAGNOSIS — G8929 Other chronic pain: Secondary | ICD-10-CM

## 2024-02-07 DIAGNOSIS — M25561 Pain in right knee: Secondary | ICD-10-CM | POA: Diagnosis not present

## 2024-02-07 NOTE — Patient Instructions (Signed)
 While we are working on your approval for MRI please go ahead and call to schedule your appointment with [facility] within at least one (1) week.   Central Scheduling [phone]

## 2024-02-07 NOTE — Progress Notes (Signed)
 Chief Complaint  Patient presents with   Knee Pain    Right feels stiff / has had physical therapy longer than 6 weeks  with no relief/ still painful    Leg Pain    From back, waiting for appointment with [facility] regarding her back, had a spinal cord stimulator / sepsis... ongoing problem    HPI: 38 year old female with bilateral hip pain with some radiation into the groin on the right side but primarily lateral and lower back presents with medial right knee pain on ambulation and sometimes at night with some popping in the right knee negative x-ray no improvement with anti-inflammatories   History noted above.  Follow-up visit.  Patient 38 years old still having trouble with his right knee status post physical therapy.  Symptoms have not changed exam is remarkable for medial joint line tenderness with small effusion decreased extension cannot get it down all the way flat flexion seems to be full but painful.  On testing the McMurray's sign it is seemingly positive  Recommend MRI right knee

## 2024-02-07 NOTE — Progress Notes (Signed)
   There were no vitals taken for this visit.  There is no height or weight on file to calculate BMI.  Chief Complaint  Patient presents with   Knee Pain    Right feels stiff / has had physical therapy longer than 6 weeks  with no relief/ still painful    Leg Pain    From back, waiting for appointment with [facility] regarding her back, had a spinal cord stimulator / sepsis... ongoing problem     No diagnosis found.  DOI/DOS/ Date: ongoing   Unchanged

## 2024-02-11 ENCOUNTER — Ambulatory Visit: Admitting: Neurology

## 2024-02-11 ENCOUNTER — Telehealth: Payer: Self-pay | Admitting: Pharmacist

## 2024-02-11 DIAGNOSIS — G43E11 Chronic migraine with aura, intractable, with status migrainosus: Secondary | ICD-10-CM

## 2024-02-11 MED ORDER — ONABOTULINUMTOXINA 200 UNITS IJ SOLR
200.0000 [IU] | Freq: Once | INTRAMUSCULAR | Status: AC
  Administered 2024-02-11: 155 [IU] via INTRAMUSCULAR

## 2024-02-11 MED ORDER — UBRELVY 100 MG PO TABS: 100.0000 mg | ORAL_TABLET | ORAL | 5 refills | Status: DC | PRN

## 2024-02-11 NOTE — Telephone Encounter (Signed)
 Pharmacy Patient Advocate Encounter  Received notification from [facility] MEDICAID that Prior Authorization for Ubrelvy 100MG  tablets has been APPROVED from 02/11/2024 to 02/10/2025   PA #/Case ID/Reference #: [id number]

## 2024-02-11 NOTE — Telephone Encounter (Signed)
 Pharmacy Patient Advocate Encounter   Received notification from Patient Pharmacy that prior authorization for Ubrelvy 100MG  tablets is required/requested.   Insurance verification completed.   The patient is insured through [facility] MEDICAID .   Per test claim: PA required; PA submitted to above mentioned insurance via CoverMyMeds Key/confirmation #/EOC  [id number] Status is pending

## 2024-02-11 NOTE — Progress Notes (Signed)

## 2024-02-14 ENCOUNTER — Telehealth: Payer: Self-pay

## 2024-02-14 NOTE — Telephone Encounter (Signed)
 PA has been submitted, and telephone encounter has been created. Please see telephone encounter dated 4.11.25.

## 2024-02-14 NOTE — Telephone Encounter (Signed)
-----   Message from Nathaniel Bald sent at 02/11/2024  2:03 PM EDT ----- Patient lost commercial insurance in December and has been on [place] since.  Has not been able to pick up Ubrelvy.  Likely needs a PA due to change in insurance.

## 2024-02-23 ENCOUNTER — Ambulatory Visit: Payer: Medicaid Other | Admitting: Family Medicine

## 2024-02-23 ENCOUNTER — Ambulatory Visit (HOSPITAL_COMMUNITY): Attending: Orthopedic Surgery

## 2024-02-23 ENCOUNTER — Encounter (HOSPITAL_COMMUNITY): Payer: Self-pay

## 2024-02-23 ENCOUNTER — Other Ambulatory Visit: Payer: Self-pay

## 2024-02-23 DIAGNOSIS — M5459 Other low back pain: Secondary | ICD-10-CM | POA: Diagnosis present

## 2024-02-23 DIAGNOSIS — R29898 Other symptoms and signs involving the musculoskeletal system: Secondary | ICD-10-CM | POA: Diagnosis present

## 2024-02-23 DIAGNOSIS — R208 Other disturbances of skin sensation: Secondary | ICD-10-CM | POA: Diagnosis present
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
## 2024-03-08 ENCOUNTER — Ambulatory Visit (HOSPITAL_COMMUNITY): Attending: Orthopedic Surgery

## 2024-03-08 ENCOUNTER — Encounter (HOSPITAL_COMMUNITY): Payer: Self-pay

## 2024-03-08 DIAGNOSIS — M5459 Other low back pain: Secondary | ICD-10-CM | POA: Diagnosis present

## 2024-03-08 DIAGNOSIS — R29898 Other symptoms and signs involving the musculoskeletal system: Secondary | ICD-10-CM | POA: Diagnosis present

## 2024-03-08 DIAGNOSIS — R208 Other disturbances of skin sensation: Secondary | ICD-10-CM | POA: Diagnosis present
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
## 2024-03-09 ENCOUNTER — Encounter (HOSPITAL_COMMUNITY)

## 2024-03-10 ENCOUNTER — Other Ambulatory Visit: Payer: Self-pay | Admitting: Neurology

## 2024-03-11 ENCOUNTER — Ambulatory Visit (HOSPITAL_COMMUNITY)
Admission: RE | Admit: 2024-03-11 | Discharge: 2024-03-11 | Disposition: A | Discharge status: Home / Self Care | Source: Ambulatory Visit | Attending: Orthopedic Surgery | Admitting: Orthopedic Surgery

## 2024-03-11 DIAGNOSIS — M25561 Pain in right knee: Secondary | ICD-10-CM | POA: Insufficient documentation

## 2024-03-11 DIAGNOSIS — G8929 Other chronic pain: Secondary | ICD-10-CM | POA: Diagnosis present

## 2024-03-13 ENCOUNTER — Encounter (HOSPITAL_COMMUNITY)

## 2024-03-14 ENCOUNTER — Encounter (HOSPITAL_COMMUNITY)

## 2024-03-15 ENCOUNTER — Other Ambulatory Visit: Payer: Self-pay | Admitting: Internal Medicine

## 2024-03-16 ENCOUNTER — Encounter (HOSPITAL_COMMUNITY): Payer: Self-pay

## 2024-03-16 ENCOUNTER — Ambulatory Visit (HOSPITAL_COMMUNITY)

## 2024-03-16 DIAGNOSIS — M5459 Other low back pain: Secondary | ICD-10-CM

## 2024-03-16 DIAGNOSIS — R29898 Other symptoms and signs involving the musculoskeletal system: Secondary | ICD-10-CM
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
## 2024-03-20 ENCOUNTER — Encounter (HOSPITAL_COMMUNITY)

## 2024-03-23 ENCOUNTER — Ambulatory Visit: Admitting: Family Medicine

## 2024-03-23 ENCOUNTER — Encounter: Payer: Self-pay | Admitting: Family Medicine

## 2024-03-23 VITALS — BP 109/73 | HR 92 | Resp 18 | Ht 62.0 in | Wt 184.1 lb

## 2024-03-23 DIAGNOSIS — M961 Postlaminectomy syndrome, not elsewhere classified: Secondary | ICD-10-CM | POA: Diagnosis not present

## 2024-03-23 DIAGNOSIS — R748 Abnormal levels of other serum enzymes: Secondary | ICD-10-CM

## 2024-03-23 DIAGNOSIS — G43E11 Chronic migraine with aura, intractable, with status migrainosus: Secondary | ICD-10-CM

## 2024-03-23 DIAGNOSIS — E039 Hypothyroidism, unspecified: Secondary | ICD-10-CM | POA: Diagnosis not present

## 2024-03-23 NOTE — Patient Instructions (Signed)
 F/U in 14 weeks, call if you need  me sooner  Labs  today  You are referred to [facility], pls keep in touch by My chart with response   Thanks for choosing [facility], we consider it a privelige to serve you.

## 2024-03-23 NOTE — Assessment & Plan Note (Signed)
 Updated lab needed at/ before next visit.

## 2024-03-24 ENCOUNTER — Encounter (HOSPITAL_COMMUNITY)

## 2024-03-24 ENCOUNTER — Telehealth (HOSPITAL_COMMUNITY): Payer: Self-pay

## 2024-03-24 ENCOUNTER — Ambulatory Visit: Payer: Self-pay | Admitting: Family Medicine

## 2024-03-24 DIAGNOSIS — R748 Abnormal levels of other serum enzymes: Secondary | ICD-10-CM

## 2024-03-24 LAB — CMP14+EGFR
ALT: 24 IU/L (ref 0–32)
AST: 21 IU/L (ref 0–40)
Albumin: 4.2 g/dL (ref 3.9–4.9)
Alkaline Phosphatase: 288 IU/L — ABNORMAL HIGH (ref 44–121)
BUN/Creatinine Ratio: 23 (ref 9–23)
BUN: 14 mg/dL (ref 6–20)
Bilirubin Total: <0.2 mg/dL (ref 0.0–1.2)
CO2: 24 mmol/L (ref 20–29)
Calcium: 9.2 mg/dL (ref 8.7–10.2)
Chloride: 101 mmol/L (ref 96–106)
Creatinine, Ser: 0.6 mg/dL (ref 0.57–1.00)
Globulin, Total: 2.5 g/dL (ref 1.5–4.5)
Glucose: 87 mg/dL (ref 70–99)
Potassium: 4.1 mmol/L (ref 3.5–5.2)
Sodium: 139 mmol/L (ref 134–144)
Total Protein: 6.7 g/dL (ref 6.0–8.5)
eGFR: 117 mL/min/{1.73_m2} (ref >59)

## 2024-03-24 LAB — TSH+FREE T4
Free T4: 0.93 ng/dL (ref 0.82–1.77)
TSH: 3.64 u[IU]/mL (ref 0.450–4.500)

## 2024-03-24 NOTE — Telephone Encounter (Signed)
 NS, called with no answer. Left message concerning missed apt. Included next apt date and time and requested pt to call if unable to make it future apts.   Minor Amble, LPTA/CLT; Johnye Napoleon [phone]

## 2024-03-28 ENCOUNTER — Encounter (HOSPITAL_COMMUNITY)

## 2024-03-31 ENCOUNTER — Encounter (HOSPITAL_COMMUNITY): Payer: Self-pay

## 2024-03-31 ENCOUNTER — Encounter: Payer: Self-pay | Admitting: Family Medicine

## 2024-03-31 ENCOUNTER — Ambulatory Visit (HOSPITAL_COMMUNITY)

## 2024-03-31 DIAGNOSIS — M5459 Other low back pain: Secondary | ICD-10-CM

## 2024-03-31 DIAGNOSIS — R748 Abnormal levels of other serum enzymes: Secondary | ICD-10-CM | POA: Insufficient documentation

## 2024-03-31 DIAGNOSIS — M961 Postlaminectomy syndrome, not elsewhere classified: Secondary | ICD-10-CM | POA: Insufficient documentation

## 2024-03-31 DIAGNOSIS — R208 Other disturbances of skin sensation: Secondary | ICD-10-CM

## 2024-03-31 DIAGNOSIS — R29898 Other symptoms and signs involving the musculoskeletal system: Secondary | ICD-10-CM

## 2024-03-31 NOTE — Assessment & Plan Note (Signed)
 Controled on current management
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
## 2024-03-31 NOTE — Progress Notes (Signed)
   Alejandra Barr     MRN: [id number]      DOB: 1986/03/25  Chief Complaint  Patient presents with   Medical Management of Chronic Issues    12 week follow up     HPI Alejandra Barr is here for follow up and re-evaluation of chronic medical conditions, medication management and review of any available recent lab and radiology data.  Since her last visit her spouse has been diagnosed with advanced cancer She continues to have chronic debilitating back pain and is in the process of trying to get [facility] spine surgery to evaluate her but was initially turned down Her pain medication has been increased and she remains very limited in what she is able to do and lives in pain   ROS Denies recent fever or chills. Denies sinus pressure, nasal congestion, ear pain or sore throat. Denies chest congestion, productive cough or wheezing. Denies chest pains, palpitations and leg swelling Denies abdominal pain, nausea, vomiting,diarrhea or constipation.   Denies dysuria, frequency, hesitancy or incontinence.  Denies uncontrolled  headaches, seizures,  C/o stress and anxiety , though would like therapy has no time for it. Denies skin break down or rash.   PE  BP 109/73   Pulse 92   Resp 18   Ht 5\' 2"  (1.575 m)   Wt 184 lb 1.3 oz (83.5 kg)   LMP 03/09/2024 (Approximate)   SpO2 98%   BMI 33.67 kg/m   Patient alert and oriented and in no cardiopulmonary distress.  HEENT: No facial asymmetry, EOMI,     Neck supple .  Chest: Clear to auscultation bilaterally.  CVS: S1, S2 no murmurs, no S3.Regular rate.  ABD: Soft non tender.   Ext: No edema  MS: markedly decreased  ROM lumbar spine, adequate In shoulders, hips and knees.  Skin: Intact, no ulcerations or rash noted.  Psych: Good eye contact, normal affect. Memory intact mildly  anxious and  depressed appearing.  CNS: CN 2-12 intact, power,  normal throughout.no focal deficits noted.   Assessment & Plan  Hypothyroidism Adequately  corrected continue same dose of synthroid   Post laminectomy syndrome Uncontrolled back pain s/p lumbar surgery x 2 request consultation at [facility] spine surgery , pt has had complications with both surgeries and her current pain level is 10 she is on chronic pain management and remains extremely limited in her ability to function  Chronic migraine with aura, intractable, with status migrainosus Controled on current management  Morbid obesity (HCC)  Patient re-educated about  the importance of commitment to a  minimum of 150 minutes of exercise per week as able.  The importance of healthy food choices with portion control discussed, as well as eating regularly and within a 12 hour window most days. The need to choose "clean , green" food 50 to 75% of the time is discussed, as well as to make water the primary drink and set a goal of 64 ounces water daily.       03/23/2024    3:27 PM 12/01/2023    2:47 PM 11/15/2023    1:43 PM  Weight /BMI  Weight 184 lb 1.3 oz 192 lb 1.9 oz 195 lb  Height 5\' 2"  (1.575 m) 5\' 2"  (1.575 m) 5\' 2"  (1.575 m)  BMI 33.67 kg/m2 35.14 kg/m2 35.67 kg/m2    Steady weight loss , congratulated on this  Elevated alkaline phosphatase level Persistently elevated level, GI to evaluate

## 2024-03-31 NOTE — Assessment & Plan Note (Signed)
 Persistently elevated level, GI to evaluate

## 2024-03-31 NOTE — Assessment & Plan Note (Signed)
  Patient re-educated about  the importance of commitment to a  minimum of 150 minutes of exercise per week as able.  The importance of healthy food choices with portion control discussed, as well as eating regularly and within a 12 hour window most days. The need to choose "clean , green" food 50 to 75% of the time is discussed, as well as to make water the primary drink and set a goal of 64 ounces water daily.       03/23/2024    3:27 PM 12/01/2023    2:47 PM 11/15/2023    1:43 PM  Weight /BMI  Weight 184 lb 1.3 oz 192 lb 1.9 oz 195 lb  Height 5\' 2"  (1.575 m) 5\' 2"  (1.575 m) 5\' 2"  (1.575 m)  BMI 33.67 kg/m2 35.14 kg/m2 35.67 kg/m2    Steady weight loss , congratulated on this

## 2024-03-31 NOTE — Assessment & Plan Note (Signed)
 Uncontrolled back pain s/p lumbar surgery x 2 request consultation at [facility] spine surgery , pt has had complications with both surgeries and her current pain level is 10 she is on chronic pain management and remains extremely limited in her ability to function

## 2024-04-03 ENCOUNTER — Telehealth (HOSPITAL_COMMUNITY): Payer: Self-pay

## 2024-04-03 ENCOUNTER — Encounter (HOSPITAL_COMMUNITY)

## 2024-04-03 NOTE — Telephone Encounter (Signed)
 NS #2 Called patient regarding missed appointment on this date. No answer. Left message on voicemail with reminder of next visit on Thursday June 5th @ 3:15 pm, and no show/cancellation policy.   6:09 PM, 04/03/24 Alejandra Barr, PT, DPT [facility] [place]

## 2024-04-03 NOTE — Progress Notes (Deleted)
 NEUROLOGY FOLLOW UP OFFICE NOTE  Alejandra Barr [id number]  Assessment/Plan:   Chronic migraine without aura, without status migrainosus, not intractable Postlaminectomy syndrome   Migraine prevention:  Botox  every 3 months; topiramate  100mg  twice daily Migraine rescue:  Ubrelvy  100mg .  Zofran  for nausea.   Limit use of pain relievers to no more than 2 days out of week to prevent risk of rebound or medication-overuse headache. Keep headache diary Follow up in 6 months for routine visit.    Subjective:  Alejandra Barr is a 38 year old female with hypothyroidism, asthma, depression, anxiety and history of a flutter s/p RFA (2004) who follows up for migraines.  UPDATE: Status post 4 rounds since restarting Botox .  Migraines still significantly improved. Intensity:  severe Duration:  3 hours with Ubrelvy  *** Frequency:  2 days a week (down from daily) ***  Current NSAIDS/analgesics:  acetaminophen , hydromorphone  (back pain), lidocaine  patch (back pain) Current triptans:  none Current ergotamine:  none Current anti-emetic:  none Current muscle relaxants:  Flexeril  5mg  TID PRN Current Antihypertensive medications:  metoprolol  succinate 25mg  BID Current Antidepressant medications:  duloxetine  30mg  BID Current Anticonvulsant medications:  topiramate  100mg  BID, gabapentin  300mg  BID Current anti-CGRP:  Ubrelvy  100mg   Current Vitamins/Herbal/Supplements:  B12, D Current Antihistamines/Decongestants:  none Other therapy:  none Birth control:  none Other medications:  Synthroid    Caffeine :  No coffee.  BC powder Alcohol:  no Smoker:  former smoker Diet:  40 oz water daily.  Drinks diet green tea.  Tries not to skip meals.   Exercise:  PT twice a week s/p back surgeries.  Limited mobility.   Pain:  Post-laminectomy syndrome.  2 failed back surgeries.  Waiting on spinal nerve stimulator. Depression:  difficult due to pain preventing her from playing with her children;  Anxiety:  no Sleep hygiene:  wakes up frequently related to back pain  HISTORY: Onset:  [age] years old.  Worse when she turned 29.  Location:  usually right hemicrania Quality:  pounding, throbbing, sharp Intensity:  severe.   Aura:  absent Prodrome:  absent Associated symptoms:  phonophobia, photophobia, nausea, vomiting.  She denies associated unilateral numbness or weakness. Duration:  4-5 hours, 3 hours with Ubrelvy  Frequency:  Prior to Botox : daily; On Botox : 1 to 2 days a week Triggers:  menses/hormonal Relieving factors:  rest in dark and quiet room Activity:  aggravates Frequency of analgesics:  BC powder 3 times daily (she is avoiding to take Ubrelvy  because she is unsure how it interacts with her pain medications)  She missed her last two doses of Botox  because her previous neurologist retired.  Last Botox  was in Botox .  Migraines are daily again.  Eye exams unremarkable  Remote MRI of brain on 05/23/2006 personally reviewed was limited by motion but overall unremarkable.    History of chronic neck and back pain radiating into both legs.  MRI of cervical spine on 07/01/2021 personally reviewed was unremarkable.  Found to have a a meningioma in the caudal thoracic spine.  Underwent T11 laminectomy and tumor resection in November 2023.  Continued to have left leg pain and numbness.  MRI of lumbar spine revealed small left subarticular disc protgrusion at L4-5 causing narrowing in the left lateral recess .  Underwent minimally invasive L4-5 discectomy in March 2024.    Past NSAIDS/analgesics:  Fioricet, ibuprofen , BC powder Past abortive triptans:  rizatriptan , sumatriptan tab Past abortive ergotamine:  none Past muscle relaxants:  none Past anti-emetic:  Reglan  Past  antihypertensive medications:  propranolol Past antidepressant medications:  Nortriptyline, amitriptyline, venlafaxine, Wellbutrin  Past anticonvulsant medications:  Depakote Past anti-CGRP:  none Past  vitamins/Herbal/Supplements:  CoQ10, magnesium  oxide Past antihistamines/decongestants:  none Other past therapies:  Botox  (effective), trigger point injections   Family history of headache:  mother (migraines), father (headaches)  PAST MEDICAL HISTORY: Past Medical History:  Diagnosis Date   Asthma    Atrial flutter (HCC)    Status post RFA by Dr. 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     albuterol  (VENTOLIN  HFA) 108 (90 Base) MCG/ACT inhaler Inhale 2 puffs into the lungs every 6 (six) hours as needed for wheezing or shortness of breath. 1 each 0   Aspirin -Caffeine  845-65 MG PACK Take 1 Package by mouth 3 (three) times daily as needed (headache/pain). 56 each    botulinum toxin Type A  (BOTOX ) 200 units injection Inject 155 units IM into multiple site in the face,neck and head once every 90 days 1 each 4   Botulinum Toxin Type A  (BOTOX ) 200 units SOLR 155 Units See admin instructions. INJECT 155 UNITS EVERY THREE MONTHS FOR CHRONIC MIGRAINES.     Cholecalciferol (VITAMIN D ) 50 MCG (2000 UT) CAPS Take 1 capsule (2,000 Units total) by mouth  daily. 30 capsule 3   Cyanocobalamin  (B-12) 1000 MCG SUBL Place 1,000 mcg under the tongue daily. 90 tablet 3   cyanocobalamin  (VITAMIN B12) 1000 MCG/ML injection Inject 1 mL (1,000 mcg total) into the muscle daily for 7 days, THEN 1 mL (1,000 mcg total) 2 (two) times a week. 15 mL 3   cyclobenzaprine  (FLEXERIL ) 5 MG tablet Take 5 mg by mouth 3 (three) times daily as needed for muscle spasms.     DULoxetine  (CYMBALTA ) 30 MG capsule Take 30 mg by mouth 2 (two) times daily.     ferrous sulfate  325 (65 FE) MG EC tablet Take 1 tablet (325 mg total) by mouth daily with breakfast. 90 tablet 3   folic acid  (V-R FOLIC ACID ) 400 MCG tablet Take 1 tablet (400 mcg total) by mouth daily. 90 tablet 3   gabapentin  (NEURONTIN ) 400 MG capsule Take 400 mg by mouth 3 (three) times daily.     HYDROmorphone  (DILAUDID ) 4 MG tablet Take 4 mg by mouth every 4 (four) hours as needed.     levothyroxine  (SYNTHROID ) 75 MCG tablet Take 1 tablet by mouth daily before breakfast. 90 tablet 0   Lidocaine  (ZTLIDO ) 1.8 % PTCH Place 1 patch onto the skin every 12 (twelve) hours as needed (pain).  metoprolol  succinate (TOPROL -XL) 25 MG 24 hr tablet Take 2 tablets by mouth daily. 60 tablet 0   NEEDLE, DISP, 25 G 25G X 1-1/2" MISC To be used with B 12 injections twice weekly 20 each 2   ondansetron  (ZOFRAN ) 4 MG tablet Take 1 tablet (4 mg total) by mouth every 8 (eight) hours as needed for nausea or vomiting. 20 tablet 11   topiramate  (TOPAMAX ) 100 MG tablet Take 1 tablet by mouth twice daily. 180 tablet 0   UBRELVY  100 MG TABS Take 1 tablet (100 mg total) by mouth as needed. May repeat after 2 hours.  Maximum 2 tablets in 24 hours. 16 tablet 5   Vitamin D , Ergocalciferol , (DRISDOL ) 1.25 MG (50000 UNIT) CAPS capsule Take 1 capsule (50,000 Units total) by mouth once a week. 5 capsule 11   No current facility-administered medications on file prior to visit.     ALLERGIES: Allergies  Allergen Reactions   Ciprofloxacin  Anaphylaxis   Morphine  And Codeine Anaphylaxis    Pt states she can tolerate hydromorphone    Penicillins Anaphylaxis    Has patient had a PCN reaction causing immediate rash, facial/tongue/throat swelling, SOB or lightheadedness with hypotension: No Has patient had a PCN reaction causing severe rash involving mucus membranes or skin necrosis: No Has patient had a PCN reaction that required hospitalization: No Has patient had a PCN reaction occurring within the last 10 years: No If all of the above answers are "NO", then may proceed with Cephalosporin use.    FAMILY HISTORY: Family History  Problem Relation Age of Onset   Migraines Mother    Fibromyalgia Mother        Chronic pelvic pain    Hypothyroidism Mother    Obesity Mother    Heart defect Mother        Supraventricular, tachycardia /tachypalpation    Arrhythmia Mother    Breast cancer Mother 54   Stroke Maternal Grandmother    Hypertension Maternal Grandmother    Diabetes Maternal Grandmother    Hypertension Maternal Grandfather    Heart disease Paternal Grandmother    Hypertension Paternal Grandmother    Heart disease Paternal Grandfather    Hypertension Paternal Grandfather       Objective:  *** General: No acute distress.  Patient appears well-groomed.   ***   Janne Members, DO  CC: Alberteen Huge, MD

## 2024-04-04 ENCOUNTER — Ambulatory Visit: Payer: BC Managed Care – PPO | Admitting: Neurology

## 2024-04-04 NOTE — Progress Notes (Deleted)
 NEUROLOGY FOLLOW UP OFFICE NOTE  Alejandra Barr [id number]  Assessment/Plan:   Chronic migraine without aura, without status migrainosus, not intractable Postlaminectomy syndrome   Migraine prevention:  Botox  every 3 months; topiramate  100mg  twice daily Migraine rescue:  Ubrelvy  100mg .  Zofran  for nausea.   Limit use of pain relievers to no more than 2 days out of week to prevent risk of rebound or medication-overuse headache. Keep headache diary Follow up in 6 months for routine visit.    Subjective:  Alejandra Barr is a 38 year old female with hypothyroidism, asthma, depression, anxiety and history of a flutter s/p RFA (2004) who follows up for migraines.  UPDATE: Status post 4 rounds since restarting Botox .  Migraines still significantly improved. Intensity:  severe Duration:  3 hours with Ubrelvy  *** Frequency:  2 days a week (down from daily) ***  Current NSAIDS/analgesics:  acetaminophen , hydromorphone  (back pain), lidocaine  patch (back pain) Current triptans:  none Current ergotamine:  none Current anti-emetic:  none Current muscle relaxants:  Flexeril  5mg  TID PRN Current Antihypertensive medications:  metoprolol  succinate 25mg  BID Current Antidepressant medications:  duloxetine  30mg  BID Current Anticonvulsant medications:  topiramate  100mg  BID, gabapentin  300mg  BID Current anti-CGRP:  Ubrelvy  100mg   Current Vitamins/Herbal/Supplements:  B12, D Current Antihistamines/Decongestants:  none Other therapy:  none Birth control:  none Other medications:  Synthroid    Caffeine :  No coffee.  BC powder Alcohol:  no Smoker:  former smoker Diet:  40 oz water daily.  Drinks diet green tea.  Tries not to skip meals.   Exercise:  PT twice a week s/p back surgeries.  Limited mobility.   Pain:  Post-laminectomy syndrome.  2 failed back surgeries.  Waiting on spinal nerve stimulator. Depression:  difficult due to pain preventing her from playing with her children;  Anxiety:  no Sleep hygiene:  wakes up frequently related to back pain  HISTORY: Onset:  [age] years old.  Worse when she turned 29.  Location:  usually right hemicrania Quality:  pounding, throbbing, sharp Intensity:  severe.   Aura:  absent Prodrome:  absent Associated symptoms:  phonophobia, photophobia, nausea, vomiting.  She denies associated unilateral numbness or weakness. Duration:  4-5 hours, 3 hours with Ubrelvy  Frequency:  Prior to Botox : daily; On Botox : 1 to 2 days a week Triggers:  menses/hormonal Relieving factors:  rest in dark and quiet room Activity:  aggravates Frequency of analgesics:  BC powder 3 times daily (she is avoiding to take Ubrelvy  because she is unsure how it interacts with her pain medications)  She missed her last two doses of Botox  because her previous neurologist retired.  Last Botox  was in Botox .  Migraines are daily again.  Eye exams unremarkable  Remote MRI of brain on 05/23/2006 personally reviewed was limited by motion but overall unremarkable.    History of chronic neck and back pain radiating into both legs.  MRI of cervical spine on 07/01/2021 personally reviewed was unremarkable.  Found to have a a meningioma in the caudal thoracic spine.  Underwent T11 laminectomy and tumor resection in November 2023.  Continued to have left leg pain and numbness.  MRI of lumbar spine revealed small left subarticular disc protgrusion at L4-5 causing narrowing in the left lateral recess .  Underwent minimally invasive L4-5 discectomy in March 2024.    Past NSAIDS/analgesics:  Fioricet, ibuprofen , BC powder Past abortive triptans:  rizatriptan , sumatriptan tab Past abortive ergotamine:  none Past muscle relaxants:  none Past anti-emetic:  Reglan  Past  antihypertensive medications:  propranolol Past antidepressant medications:  Nortriptyline, amitriptyline, venlafaxine, Wellbutrin  Past anticonvulsant medications:  Depakote Past anti-CGRP:  none Past  vitamins/Herbal/Supplements:  CoQ10, magnesium  oxide Past antihistamines/decongestants:  none Other past therapies:  Botox  (effective), trigger point injections   Family history of headache:  mother (migraines), father (headaches)  PAST MEDICAL HISTORY: Past Medical History:  Diagnosis Date   Asthma    Atrial flutter (HCC)    Status post RFA by Dr. 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     albuterol  (VENTOLIN  HFA) 108 (90 Base) MCG/ACT inhaler Inhale 2 puffs into the lungs every 6 (six) hours as needed for wheezing or shortness of breath. 1 each 0   Aspirin -Caffeine  845-65 MG PACK Take 1 Package by mouth 3 (three) times daily as needed (headache/pain). 56 each    botulinum toxin Type A  (BOTOX ) 200 units injection Inject 155 units IM into multiple site in the face,neck and head once every 90 days 1 each 4   Botulinum Toxin Type A  (BOTOX ) 200 units SOLR 155 Units See admin instructions. INJECT 155 UNITS EVERY THREE MONTHS FOR CHRONIC MIGRAINES.     Cholecalciferol (VITAMIN D ) 50 MCG (2000 UT) CAPS Take 1 capsule (2,000 Units total) by mouth  daily. 30 capsule 3   Cyanocobalamin  (B-12) 1000 MCG SUBL Place 1,000 mcg under the tongue daily. 90 tablet 3   cyanocobalamin  (VITAMIN B12) 1000 MCG/ML injection Inject 1 mL (1,000 mcg total) into the muscle daily for 7 days, THEN 1 mL (1,000 mcg total) 2 (two) times a week. 15 mL 3   cyclobenzaprine  (FLEXERIL ) 5 MG tablet Take 5 mg by mouth 3 (three) times daily as needed for muscle spasms.     DULoxetine  (CYMBALTA ) 30 MG capsule Take 30 mg by mouth 2 (two) times daily.     ferrous sulfate  325 (65 FE) MG EC tablet Take 1 tablet (325 mg total) by mouth daily with breakfast. 90 tablet 3   folic acid  (V-R FOLIC ACID ) 400 MCG tablet Take 1 tablet (400 mcg total) by mouth daily. 90 tablet 3   gabapentin  (NEURONTIN ) 400 MG capsule Take 400 mg by mouth 3 (three) times daily.     HYDROmorphone  (DILAUDID ) 4 MG tablet Take 4 mg by mouth every 4 (four) hours as needed.     levothyroxine  (SYNTHROID ) 75 MCG tablet Take 1 tablet by mouth daily before breakfast. 90 tablet 0   Lidocaine  (ZTLIDO ) 1.8 % PTCH Place 1 patch onto the skin every 12 (twelve) hours as needed (pain).  metoprolol  succinate (TOPROL -XL) 25 MG 24 hr tablet Take 2 tablets by mouth daily. 60 tablet 0   NEEDLE, DISP, 25 G 25G X 1-1/2" MISC To be used with B 12 injections twice weekly 20 each 2   ondansetron  (ZOFRAN ) 4 MG tablet Take 1 tablet (4 mg total) by mouth every 8 (eight) hours as needed for nausea or vomiting. 20 tablet 11   topiramate  (TOPAMAX ) 100 MG tablet Take 1 tablet by mouth twice daily. 180 tablet 0   UBRELVY  100 MG TABS Take 1 tablet (100 mg total) by mouth as needed. May repeat after 2 hours.  Maximum 2 tablets in 24 hours. 16 tablet 5   Vitamin D , Ergocalciferol , (DRISDOL ) 1.25 MG (50000 UNIT) CAPS capsule Take 1 capsule (50,000 Units total) by mouth once a week. 5 capsule 11   No current facility-administered medications on file prior to visit.     ALLERGIES: Allergies  Allergen Reactions   Ciprofloxacin  Anaphylaxis   Morphine  And Codeine Anaphylaxis    Pt states she can tolerate hydromorphone    Penicillins Anaphylaxis    Has patient had a PCN reaction causing immediate rash, facial/tongue/throat swelling, SOB or lightheadedness with hypotension: No Has patient had a PCN reaction causing severe rash involving mucus membranes or skin necrosis: No Has patient had a PCN reaction that required hospitalization: No Has patient had a PCN reaction occurring within the last 10 years: No If all of the above answers are "NO", then may proceed with Cephalosporin use.    FAMILY HISTORY: Family History  Problem Relation Age of Onset   Migraines Mother    Fibromyalgia Mother        Chronic pelvic pain    Hypothyroidism Mother    Obesity Mother    Heart defect Mother        Supraventricular, tachycardia /tachypalpation    Arrhythmia Mother    Breast cancer Mother 54   Stroke Maternal Grandmother    Hypertension Maternal Grandmother    Diabetes Maternal Grandmother    Hypertension Maternal Grandfather    Heart disease Paternal Grandmother    Hypertension Paternal Grandmother    Heart disease Paternal Grandfather    Hypertension Paternal Grandfather       Objective:  *** General: No acute distress.  Patient appears well-groomed.   ***   Janne Members, DO  CC: Alberteen Huge, MD

## 2024-04-05 ENCOUNTER — Telehealth: Payer: Self-pay | Admitting: Neurology

## 2024-04-05 ENCOUNTER — Ambulatory Visit: Admitting: Neurology
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
## 2024-04-05 NOTE — Telephone Encounter (Signed)
 Pharmacy Patient Advocate Encounter- Botox BIV-Pharmacy Benefit:  PA was submitted to [facility] MEDICAID and has been approved through: 3.10.25 TO 3.10.26 Authorization# [id number]  Please send prescription to Specialty Pharmacy: [facility]: [phone]   Admin Code: [id number]   DOES NOT require Prior Auth.  Patient IS NOT eligible for Botox Copay Card, which will make patient's copay as little as zero. Copay card will be provided to pharmacy.

## 2024-04-05 NOTE — Telephone Encounter (Signed)
 She is ok with a Clinical cytogeneticist message. Her phone sometimes doesn't work.

## 2024-04-06 ENCOUNTER — Encounter (HOSPITAL_COMMUNITY)

## 2024-04-10 ENCOUNTER — Encounter (INDEPENDENT_AMBULATORY_CARE_PROVIDER_SITE_OTHER): Payer: Self-pay | Admitting: *Deleted

## 2024-04-10 ENCOUNTER — Ambulatory Visit (HOSPITAL_COMMUNITY): Attending: Orthopedic Surgery

## 2024-04-10 DIAGNOSIS — R262 Difficulty in walking, not elsewhere classified: Secondary | ICD-10-CM | POA: Diagnosis present

## 2024-04-10 DIAGNOSIS — M5459 Other low back pain: Secondary | ICD-10-CM | POA: Insufficient documentation

## 2024-04-10 DIAGNOSIS — R29898 Other symptoms and signs involving the musculoskeletal system: Secondary | ICD-10-CM | POA: Insufficient documentation
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
## 2024-04-12 ENCOUNTER — Telehealth: Payer: Self-pay | Admitting: Orthopedic Surgery

## 2024-04-12 ENCOUNTER — Encounter: Payer: Self-pay | Admitting: Hematology

## 2024-04-12 NOTE — Telephone Encounter (Signed)
 Vit D 50000 units weekly is the highest dose told her to use that and she voiced understanding  What brace does she need? I told her I will call back about the brace

## 2024-04-12 NOTE — Telephone Encounter (Signed)
 Dr. Delfino Fellers pt - pt called back bc she missed an a call.  Dr. Phyllis Breeze had tried to call her.  She stated she already takes 50,000iu (???) of vitamin D  weekly, she wants to know if that goes any higher.  She also wants to know about the brace.  She would like a call back [phone]

## 2024-04-12 NOTE — Telephone Encounter (Signed)
 This patient was called could not get through on the phone therefore I sent a text message  She has bone edema arthritis of the knee and will need brace for 6 weeks and vitamin D  and medication for arthritis

## 2024-04-13 ENCOUNTER — Encounter (HOSPITAL_COMMUNITY)

## 2024-04-13 ENCOUNTER — Telehealth (HOSPITAL_COMMUNITY): Payer: Self-pay

## 2024-04-13 NOTE — Telephone Encounter (Signed)
 NS #3 Called patient concerning missed appointment this date. No answer, left voicemail explaining No Show policy. Cancelled all remaining appointments except for next appointment on Monday 04/17/24 at 2:30 pm. Reminded patient of appointment time and that from that appointment on she will have to schedule appointments one at a time.    3:57 PM, 04/13/24 Marysue Sola, PT, DPT [facility] [place]

## 2024-04-17 ENCOUNTER — Encounter (HOSPITAL_COMMUNITY)

## 2024-04-19 NOTE — Telephone Encounter (Signed)
 She can come next week for brace I lm for her to call back to let me know when she can come in for brace, if she comes Friday she may have to wait if she comes next week I just need to know when.

## 2024-04-21 ENCOUNTER — Ambulatory Visit: Admitting: Neurology

## 2024-04-24 ENCOUNTER — Inpatient Hospital Stay: Attending: Physician Assistant

## 2024-04-26 ENCOUNTER — Encounter (HOSPITAL_COMMUNITY): Admitting: Physical Therapy

## 2024-04-28 IMAGING — MR MR LUMBAR SPINE W/O CM
5 series · 31 of 48 positions shown · non-contrast
Comparison: Radiograph from 03/10/2022.

CLINICAL DATA: Initial evaluation for low back pain with radiation
into both lower extremities.

EXAM:
MRI LUMBAR SPINE WITHOUT CONTRAST
TECHNIQUE: Multiplanar, multisequence MR imaging of the lumbar spine was
performed. No intravenous contrast was administered.

[Series 6: T1 · sagittal · 4.0mm · 0.81mm/px · 6 of 15 slices shown (1 of 2)]
[im 1/15]
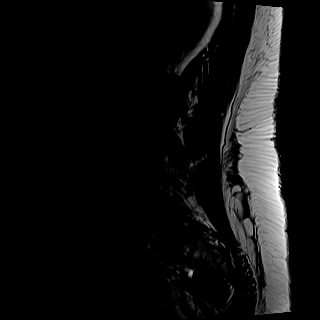
[im 3/15]
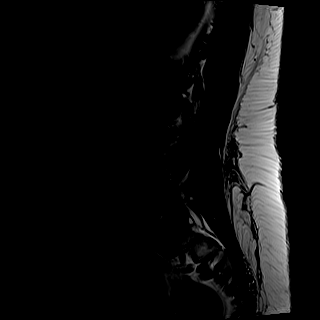
[im 6/15]
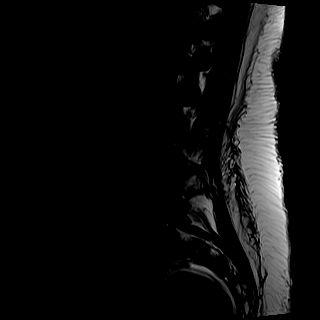
[im 9/15]
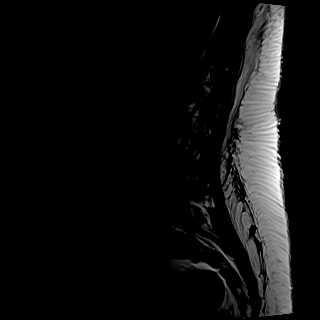
[im 12/15]
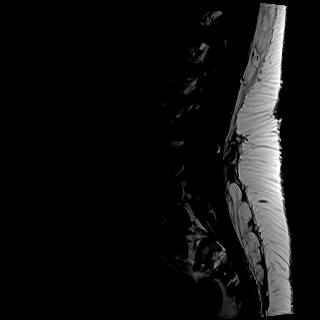
[im 15/15]
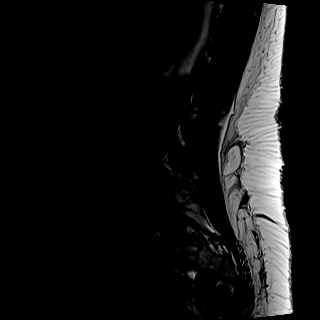

[Series 7: STIR · sagittal · 4.0mm · 0.51mm/px · 2 of 15 slices shown]
[im 1/15]
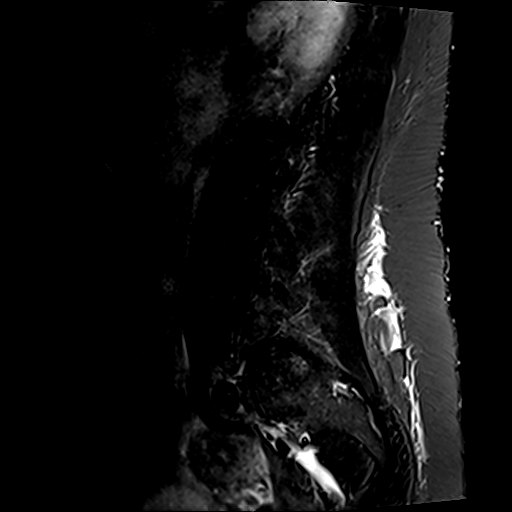
[im 3/15]
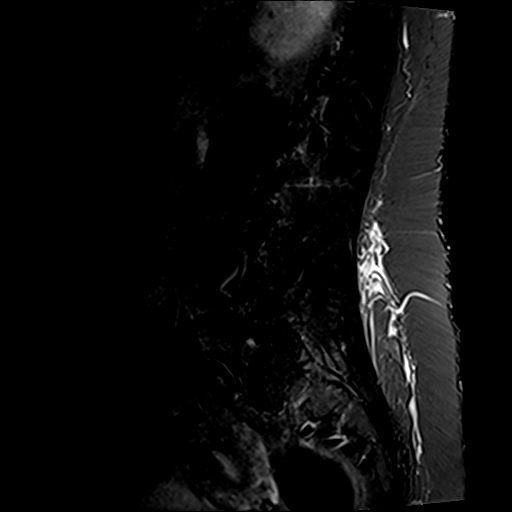

[Series 8: T2 · sagittal · 4.0mm · 0.81mm/px · 7 of 15 slices shown (1 of 2)]
[im 1/15]
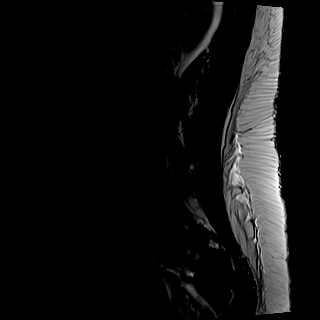
[im 3/15]
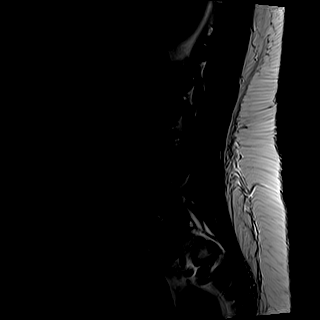
[im 5/15]
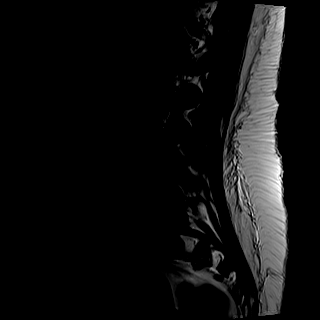
[im 8/15]
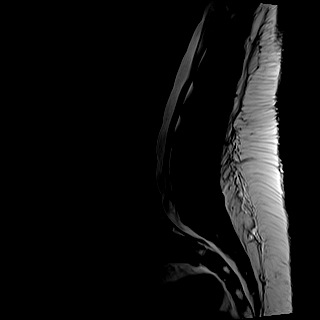
[im 10/15]
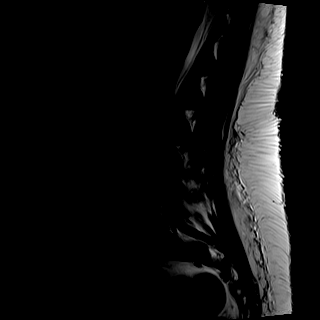
[im 12/15]
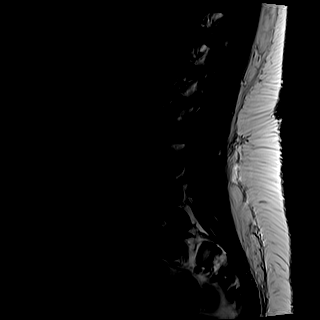
[im 15/15]
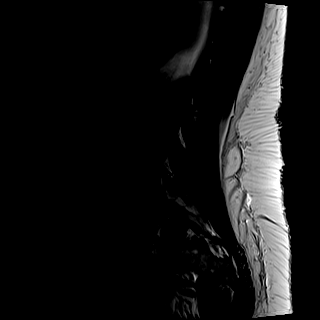

[Series 9: T2 · axial · 4.0mm · 0.70mm/px · z∈[-124,+48]mm · 8 of 31 slices shown (2 of 2)]
[im 1/31]
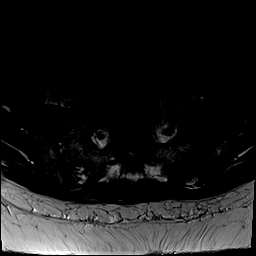
[im 5/31]
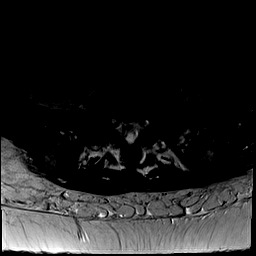
[im 10/31]
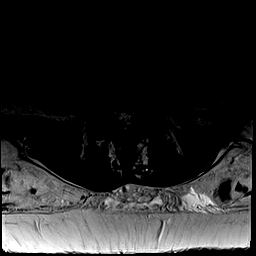
[im 14/31]
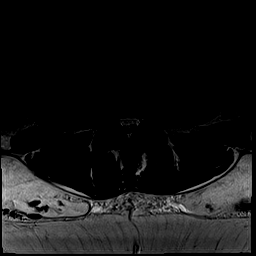
[im 17/31]
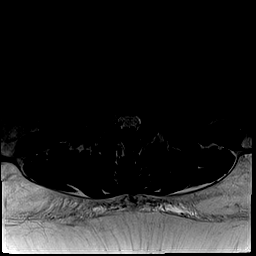
[im 21/31]
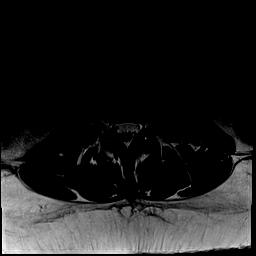
[im 26/31]
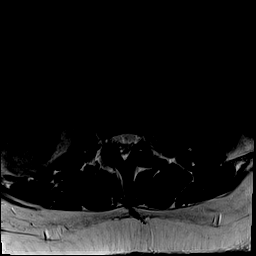
[im 31/31]
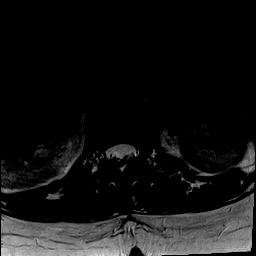

[Series 10: T1 · axial · 4.0mm · 0.35mm/px · z∈[-124,+48]mm · 8 of 31 slices shown (2 of 2)]
[im 1/31]
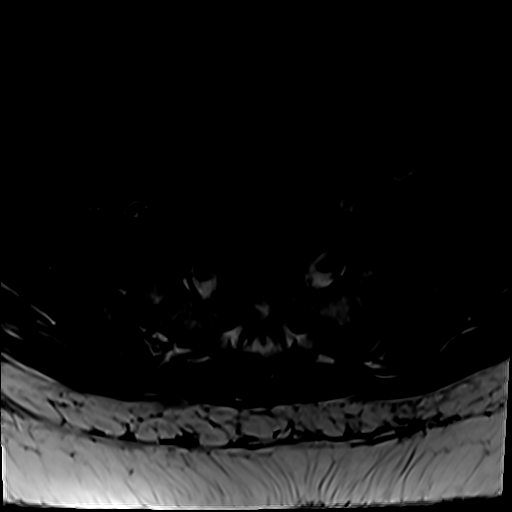
[im 5/31]
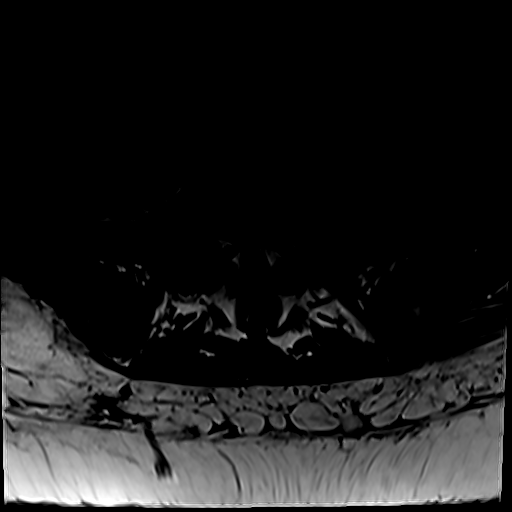
[im 10/31]
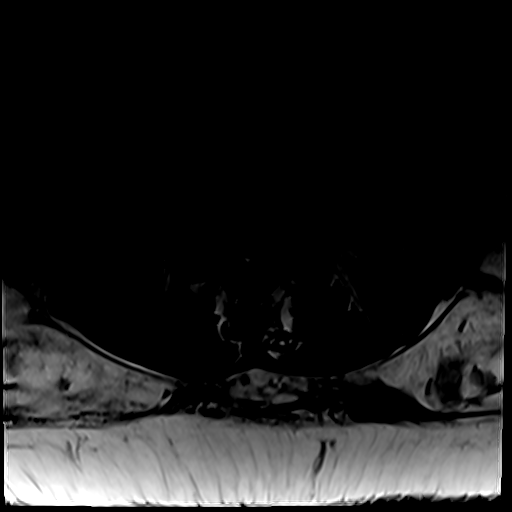
[im 14/31]
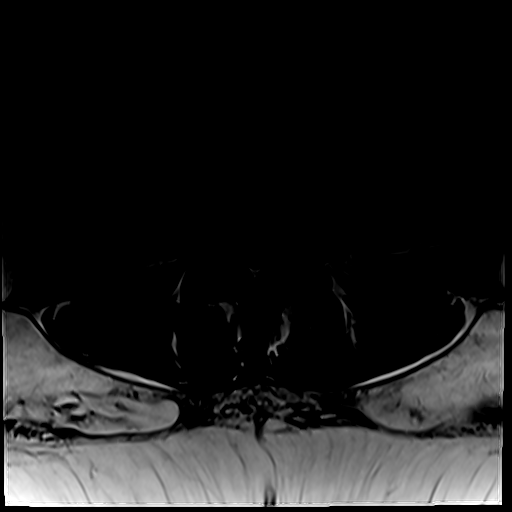
[im 17/31]
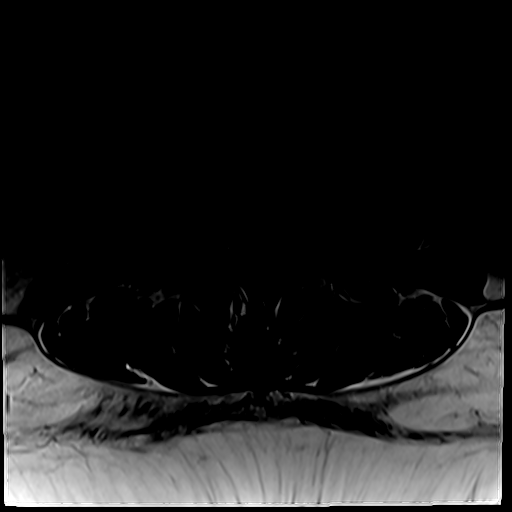
[im 21/31]
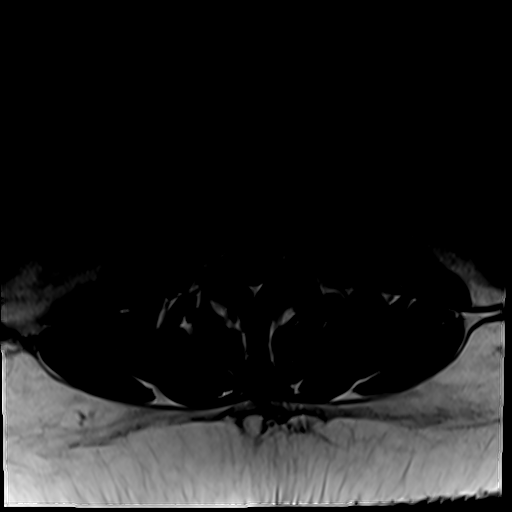
[im 26/31]
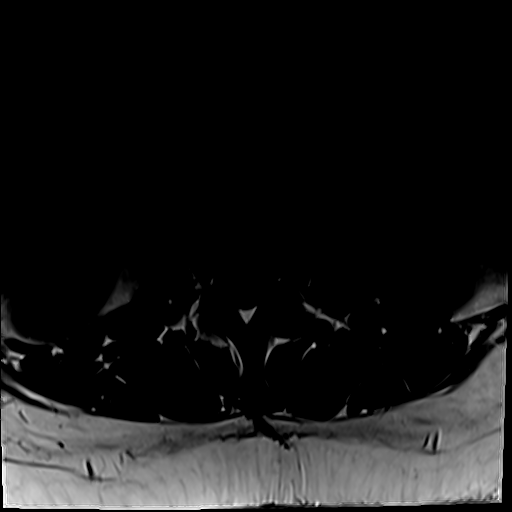
[im 31/31]
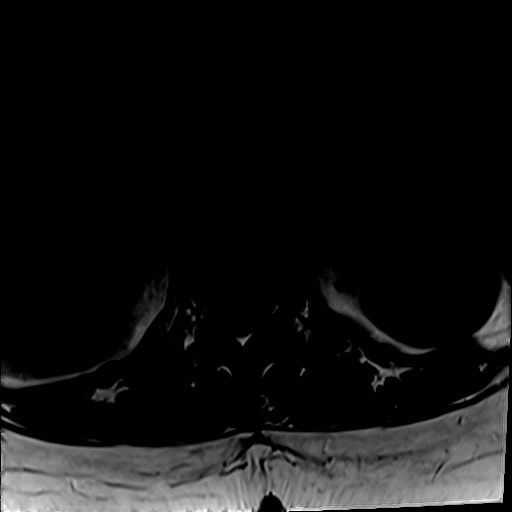

[31 of 48 positions shown; findings below may reference images not displayed]

FINDINGS: Segmentation: Standard. Lowest well-formed disc space labeled the
L5-S1 level.

Alignment: Mild exaggeration of the normal lumbar lordosis. No
listhesis.

Vertebrae: Vertebral body height maintained without acute or chronic
fracture. Bone marrow signal intensity diffusely decreased on T1
weighted sequence, nonspecific, but most commonly related to anemia,
smoking or obesity. No discrete or worrisome osseous lesions. Mild
discogenic reactive endplate change present about the T11-12 and
L4-5 interspaces. No other abnormal marrow edema.

Conus medullaris and cauda equina: Conus extends to the T12 level.
Conus and cauda equina appear normal.

Paraspinal and other soft tissues: Unremarkable.

Disc levels:

T11-12: Seen only on sagittal projection. Mild disc bulge with disc
desiccation. Mild reactive endplate spurring with small endplate
Schmorl's node deformity. No significant stenosis.

T12-L1: Unremarkable.

L1-2:  Unremarkable.

L2-3: Small left foraminal to extraforaminal disc protrusion closely
approximates the exiting left L2 nerve root (series 10, image 12).
No significant spinal stenosis. Foramina remain patent.

L3-4: Mild disc bulge with disc desiccation. Mild bilateral facet
hypertrophy. No significant spinal stenosis. Foramina remain patent.

L4-5: Degenerative intervertebral disc space narrowing with disc
desiccation and diffuse disc bulge. Mild reactive endplate change.
Superimposed central to left subarticular disc extrusion with
inferior migration (series 8, image 9). Mild facet hypertrophy.
Resultant mild canal and right lateral recess stenosis, with more
moderate narrowing of the left lateral recess. Mild to moderate left
L4 foraminal stenosis. Right neural foramina remains patent.

L5-S1: Disc desiccation. Small central disc protrusion closely
approximates the descending S1 nerve roots without frank impingement
(series 10, image 27). Mild facet hypertrophy. No significant spinal
stenosis. Mild bilateral L5 foraminal narrowing.
IMPRESSION: 1. Central to left subarticular disc extrusion with inferior
migration at L4-5 with resultant mild canal with moderate left
lateral recess stenosis, with mild to moderate left L4 foraminal
narrowing.
2. Small central disc protrusion at L5-S1, closely approximating the
descending S1 nerve roots without frank impingement.
3. Small left foraminal to extraforaminal disc protrusion at L2-3,
closely approximating and potentially irritating the exiting left L2
nerve root.

## 2024-05-01 ENCOUNTER — Inpatient Hospital Stay: Admitting: Physician Assistant

## 2024-05-01 ENCOUNTER — Encounter: Payer: Self-pay | Admitting: Orthopedic Surgery

## 2024-05-01 ENCOUNTER — Ambulatory Visit: Admitting: Orthopedic Surgery

## 2024-05-01 DIAGNOSIS — M1711 Unilateral primary osteoarthritis, right knee: Secondary | ICD-10-CM | POA: Diagnosis not present

## 2024-05-01 MED ORDER — DICLOFENAC SODIUM 1 % EX GEL
4.0000 g | Freq: Four times a day (QID) | CUTANEOUS | 3 refills | Status: AC
Start: 2024-05-01 — End: ?

## 2024-05-01 NOTE — Progress Notes (Signed)
 Appointment for MRI follow-up.  Patient advised to come in to get a brace on the right knee to continue her vitamin D    Chief Complaint  Patient presents with   Knee Pain    Right review MRI     MRI was reviewed previously and she has several findings.  None of which I feel are surgical.  On my review I find the following: She has bone edema in the lateral compartment she has nondisplaced meniscal tear as well.  No medial meniscal tear was found  Lateral meniscus small nonsurgical tear posterior horn  Lateral compartment cartilage erosion and degeneration bone edema as stated in the weightbearing area laterally  Thinning medially  The report will be listed below under impression  I recommended a economy hinged brace, Voltaren gel as she has a gastric bypass and cannot take NSAIDs, maintain a good weight, periodic injections.  We discussed the risk of injecting cortisone in her knee at 38 years old with the already compromised cartilage  Encounter Diagnosis  Name Primary?   Primary osteoarthritis of right knee Yes   59-month follow-up  Meds ordered this encounter  Medications   diclofenac Sodium (VOLTAREN) 1 % GEL    Sig: Apply 4 g topically 4 (four) times daily.    Dispense:  4 g    Refill:  3      IMPRESSION: 1. Moderate intrasubstance degeneration with mild swelling/enlargement of the junction of the body and posterior horn of the medial meniscus. No tear is seen extending through an articular surface of the medial meniscus. 2. Small nondisplaced tear of the superior articular surface of the peripheral third of the meniscal triangle of the anterior horn of the lateral meniscus. 3. Mucoid degeneration of the superior ACL fibers (ACL cyst). No ACL tear is seen. 4. Moderate to high-grade thinning of the predominantly mid to medial aspect of the lateral tibial plateau cartilage with subchondral marrow edema and cystic change throughout an approximate 2.0 cm  transverse dimension region. More mild thinning of the weight-bearing lateral femoral condyle cartilage, greatest laterally. 5. Moderate thinning of the weight-bearing medial femoral condyle cartilage, greatest medially. 6. Mild to moderate joint effusion. 7. Mild focal edema within the superolateral aspect of Hoffa's fat pad as can be seen with patellar tendon-lateral femoral condyle friction syndrome.     Electronically Signed   By: Tanda Lyons M.D.   On: 04/10/2024 12:13

## 2024-05-01 NOTE — Progress Notes (Signed)
   There were no vitals taken for this visit.  There is no height or weight on file to calculate BMI.  Chief Complaint  Patient presents with   Knee Pain    Right review MRI

## 2024-05-01 NOTE — Patient Instructions (Signed)
Apply  Voltaren gel 4x a day.

## 2024-05-02 ENCOUNTER — Inpatient Hospital Stay: Admitting: Physician Assistant

## 2024-05-03 ENCOUNTER — Encounter (HOSPITAL_COMMUNITY)

## 2024-05-04 MED ORDER — BOTOX 200 UNITS IJ SOLR: INTRAMUSCULAR | 4 refills | Status: AC

## 2024-05-04 NOTE — Telephone Encounter (Signed)
 New script sent

## 2024-05-04 NOTE — Addendum Note (Signed)
 Addended by: OZELL JESUSA PARAS on: 05/04/2024 02:37 PM   Modules accepted: Orders

## 2024-05-08 ENCOUNTER — Telehealth: Payer: Self-pay | Admitting: Neurology

## 2024-05-08 NOTE — Telephone Encounter (Signed)
 Per rep Lupita A.,consent needed from patient.  Calling patient while on the phone to get consent to deliver.   Consent given by patient to the pharmacy.   Delivery 05/12/24.

## 2024-05-08 NOTE — Telephone Encounter (Signed)
 Pt would like a call just to confirm appt once you get the information you need from [facility]

## 2024-05-10 ENCOUNTER — Encounter (HOSPITAL_COMMUNITY)

## 2024-05-12 ENCOUNTER — Ambulatory Visit: Admitting: Neurology

## 2024-05-12 DIAGNOSIS — G43E11 Chronic migraine with aura, intractable, with status migrainosus: Secondary | ICD-10-CM | POA: Diagnosis not present

## 2024-05-12 MED ORDER — ONABOTULINUMTOXINA 100 UNITS IJ SOLR
200.0000 [IU] | Freq: Once | INTRAMUSCULAR | Status: AC
  Administered 2024-05-12: 155 [IU] via INTRAMUSCULAR

## 2024-05-12 MED ORDER — UBRELVY 100 MG PO TABS
100.0000 mg | ORAL_TABLET | ORAL | 5 refills | Status: AC | PRN
Start: 2024-05-12 — End: ?

## 2024-05-12 NOTE — Addendum Note (Signed)
 Addended by: OZELL JESUSA PARAS on: 05/12/2024 01:11 PM   Modules accepted: Orders

## 2024-05-12 NOTE — Progress Notes (Signed)

## 2024-06-23 ENCOUNTER — Ambulatory Visit: Payer: Self-pay

## 2024-06-23 NOTE — Telephone Encounter (Signed)
 FYI Only or Action Required?: Action required by provider: request for appointment.  Patient was last seen in primary care on 03/23/2024 by Antonetta Rollene BRAVO, MD.  Called Nurse Triage reporting Burn.  Symptoms began a week ago.  Interventions attempted: Rest, hydration, or home remedies.  Symptoms are: unchanged.Has applied antibiotic ointment, oozing.  Triage Disposition: See PCP When Office is Open (Within 3 Days)  Patient/caregiver understands and will follow disposition?: Yes   Copied from CRM #[id number]. Topic: Clinical - Red Word Triage >> Jun 23, 2024 11:34 AM Donna BRAVO wrote: Red Word that prompted transfer to Nurse Triage: patient has burn on lower back from heating pad, blistered on week ago and popped, the burn itself is not going away and very painful and oozing, putting neosporin ointment Reason for Disposition  [1] After 10 days AND [2] burn isn't healed  Answer Assessment - Initial Assessment Questions 1. ONSET: When did it happen? If happened < 3 hours ago, ask: Did you apply cool water? If not, give First Aid Advice immediately.      1 week ago 2. LOCATION: Where is the burn located?      Low back and buttock 3. BURN SIZE: How large is the burn?  The palm is roughly 0.5% of the total body surface area (BSA).     Coin size 4. SEVERITY OF THE BURN: Are there any blisters? What size are they? (e.g., quarter equals 1 inch or 2.5 cm) Are any of the blisters broken (open or wrinkled)?     1 blister 5. MECHANISM: Tell me how it happened.     Heating pad 6. PAIN: Are you having any pain? How bad is the pain? (Scale 0-10; or none, mild, moderate, severe)     no 7. INHALATION INJURY: Were you inside an enclosed space with heat and smoke? If Yes, ask: Do you have any cough or difficulty breathing?     no 8. OTHER SYMPTOMS: Do you have any other symptoms? (e.g., headache, nausea)     no 9. PREGNANCY: Is there any chance you are pregnant? When  was your last menstrual period?     no  Protocols used: [facility]

## 2024-06-26 ENCOUNTER — Ambulatory Visit: Payer: Self-pay

## 2024-06-26 VITALS — BP 132/84 | HR 99 | Ht 61.0 in | Wt 174.0 lb

## 2024-06-26 DIAGNOSIS — T300 Burn of unspecified body region, unspecified degree: Secondary | ICD-10-CM

## 2024-06-26 DIAGNOSIS — T2104XA Burn of unspecified degree of lower back, initial encounter: Secondary | ICD-10-CM

## 2024-06-26 MED ORDER — SILVER SULFADIAZINE 1 % EX CREA: 1.0000 | TOPICAL_CREAM | Freq: Two times a day (BID) | CUTANEOUS | 2 refills | Status: AC

## 2024-06-26 NOTE — Progress Notes (Signed)
 Established Patient Office Visit  Subjective   Patient ID: Alejandra Barr, female    DOB: 1986-10-22  Age: 38 y.o. MRN: [id number]  Chief Complaint  Patient presents with   Medical Management of Chronic Issues    Pt left a heating pas on too long and too hot, neosporin has not helped any, wondering if theres anything that could be Called in that can be applied topically for it, Pt states the pain in her back has been so bad she did not feel it burning her     HPI  Discussed the use of AI scribe software for clinical note transcription with the patient, who gave verbal consent to proceed.  History of Present Illness   KRISHAUNA SCHATZMAN is a 38 year old female who presents with a burn on her lower back and buttock area.  Thermal burn to lower back and buttock - Burn located on lower back and buttock area - Resulted from prolonged use of a heating pad set to 150 degrees (maximum setting) - Inability to feel heat due to severe back pain contributed to burn development - Initial presentation as a blister, which subsequently ruptured - Burn has been draining for the past week and a half - No improvement despite application of over-the-counter antibiotic cream  Severe back pain - Severe back pain requiring near-constant use of heating pad - Pain significant enough to impair sensation of heat in affected area  History of spinal infection and surgical intervention - History of multiple spinal surgeries - Spinal cord stimulator placed and subsequently removed after five weeks due to sepsis - Hospitalized for eleven days for intravenous antibiotics - Infection extended from lumbar to thoracic spine       Patient Active Problem List   Diagnosis Date Noted   Burn 07/02/2024   Post laminectomy syndrome 03/31/2024   Elevated alkaline phosphatase level 03/31/2024   ERRONEOUS ENCOUNTER--DISREGARD 12/07/2023   Stress and adjustment reaction 12/06/2023   Encounter for immunization  12/06/2023   Anxiety about health 09/20/2023   Hypokalemia 08/04/2023   Surgical site infection 08/04/2023   Spinal meningioma (HCC) 05/18/2023   Chronic migraine with aura, intractable, with status migrainosus 03/07/2023   Radiculopathy of lumbar region 01/21/2023   Symptomatic PVCs 11/24/2022   SOB (shortness of breath) 11/24/2022   Weakness 09/12/2022   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   Depression, major, single episode, severe (HCC) 06/28/2022   Right hip pain 03/08/2022   Right knee pain 03/08/2022   Lump of axilla, left 03/05/2022   FH: breast cancer 03/05/2022   COVID-19 10/20/2021   Neck pain 06/19/2021   Lumbar back pain with radiculopathy affecting right lower extremity 06/19/2021   Acute on chronic anemia 03/03/2021   Hypothyroidism 03/02/2021   GDM, class A2 03/02/2021   History of postpartum hemorrhage, delayed 03/01/2021   RLTCS 4/30 03/01/2021   Abdominal pain 10/16/2013   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 09/07/2013   GAD (generalized anxiety disorder) 06/22/2013   Vitamin D  deficiency 08/02/2012   Iron  deficiency anemia 10/02/2010   Atrial flutter (HCC) 08/26/2009   Morbid obesity (HCC) 01/09/2008   Asthma, well controlled 01/09/2008    ROS    Objective:     BP 132/84   Pulse 99   Ht 5' 1 (1.549 m)   Wt 174 lb (78.9 kg)   SpO2 97%   BMI 32.88 kg/m  BP Readings from Last 3 Encounters:  06/26/24 132/84  03/23/24 109/73  12/01/23 127/84   Wt  Readings from Last 3 Encounters:  06/26/24 174 lb (78.9 kg)  03/23/24 184 lb 1.3 oz (83.5 kg)  12/01/23 192 lb 1.9 oz (87.1 kg)     Physical Exam Vitals and nursing note reviewed.  Constitutional:      Appearance: Normal appearance.  HENT:     Head: Normocephalic.  Eyes:     Extraocular Movements: Extraocular movements intact.     Pupils: Pupils are equal, round, and reactive to light.  Cardiovascular:     Rate and Rhythm: Normal rate and regular rhythm.  Pulmonary:     Effort: Pulmonary effort is  normal.     Breath sounds: Normal breath sounds.  Musculoskeletal:     Cervical back: Normal range of motion and neck supple.     Lumbar back: Spasms and tenderness present. Decreased range of motion.  Skin:    Findings: Burn (lower lumbar) and erythema present.      Neurological:     Mental Status: She is alert and oriented to person, place, and time.  Psychiatric:        Mood and Affect: Mood normal.        Thought Content: Thought content normal.    No results found for any visits on 06/26/24.    The ASCVD Risk score (Arnett DK, et al., 2019) failed to calculate for the following reasons:   The 2019 ASCVD risk score is only valid for ages 44 to 7    Assessment & Plan:   Problem List Items Addressed This Visit       Other   Burn - Primary   Burn of lower back and buttock region Burn from prolonged heating pad use, blister now draining, no improvement with OTC antibiotic cream. - Prescribed Silvadene  cream for daily topical application.       Relevant Medications   silver  sulfADIAZINE  (SILVADENE ) 1 % cream         No follow-ups on file.    Leita Longs, FNP

## 2024-06-29 ENCOUNTER — Ambulatory Visit: Admitting: Family Medicine

## 2024-07-02 DIAGNOSIS — T300 Burn of unspecified body region, unspecified degree: Secondary | ICD-10-CM | POA: Insufficient documentation

## 2024-07-02 NOTE — Assessment & Plan Note (Signed)
 Burn of lower back and buttock region Burn from prolonged heating pad use, blister now draining, no improvement with OTC antibiotic cream. - Prescribed Silvadene  cream for daily topical application.

## 2024-07-04 ENCOUNTER — Encounter: Payer: Self-pay | Admitting: Family Medicine

## 2024-07-04 ENCOUNTER — Ambulatory Visit (INDEPENDENT_AMBULATORY_CARE_PROVIDER_SITE_OTHER): Admitting: Family Medicine

## 2024-07-04 VITALS — BP 110/70 | HR 87 | Resp 16 | Ht 61.0 in | Wt 167.1 lb

## 2024-07-04 DIAGNOSIS — M5416 Radiculopathy, lumbar region: Secondary | ICD-10-CM

## 2024-07-04 DIAGNOSIS — R748 Abnormal levels of other serum enzymes: Secondary | ICD-10-CM | POA: Diagnosis not present

## 2024-07-04 DIAGNOSIS — Z23 Encounter for immunization: Secondary | ICD-10-CM

## 2024-07-04 DIAGNOSIS — E039 Hypothyroidism, unspecified: Secondary | ICD-10-CM | POA: Diagnosis not present

## 2024-07-04 DIAGNOSIS — Z13220 Encounter for screening for lipoid disorders: Secondary | ICD-10-CM | POA: Diagnosis not present

## 2024-07-04 DIAGNOSIS — F4329 Adjustment disorder with other symptoms: Secondary | ICD-10-CM

## 2024-07-04 MED ORDER — LEVOTHYROXINE SODIUM 75 MCG PO TABS: 75.0000 ug | ORAL_TABLET | Freq: Every day | ORAL | 0 refills | Status: DC

## 2024-07-04 NOTE — Progress Notes (Signed)
   Alejandra Barr     MRN: [id number]      DOB: [birth year]  Chief Complaint  Patient presents with   Medical Management of Chronic Issues    HPI Alejandra Barr is here for follow up and re-evaluation of chronic medical conditions, medication management and review of any available recent lab and radiology data.  Preventive health is updated, specifically  Cancer screening and Immunization.   Has upcoming appointment at [facility]  regarding her debilitating back and lower extremity pain which she desperately  hopes will bring her some relief Currently also experiencing the additional stress of her husband being diagnosed with stage 4 cancer  with a poor prognosis and he is currently the only source of income. She  has worked since age 67 and only knows of her responsibility to support herself and her children, so mentally her stress is very real  The PT reequests letter  of support of uncontrolled disablng back and lower extremity painrated at 10 plus conastantly despite 2 surgeries and ongoing pain management including stimulator  placement Back started in 2015 , she had her  first back surgery in 09/2022, and she worked until December 2024 Medical records speak for themselves C/o Urinary incontinence and since 05/2023 RLE weakness and reduced sensation progressibve inpast year  ROS: Denies recent fever or chills. Denies sinus pressure, nasal congestion, ear pain or sore throat. Denies chest congestion, productive cough or wheezing. Denies chest pains, palpitations and leg swelling Denies abdominal pain, nausea, vomiting,diarrhea or constipation.   Denies skin break down or rash.   PE  BP 110/70   Pulse 87   Resp 16   Ht 5' 1 (1.549 m)   Wt 167 lb 1.3 oz (75.8 kg)   LMP 06/14/2024 (Approximate)   SpO2 98%   BMI 31.57 kg/m   Patient alert and oriented and in no cardiopulmonary distress.  HEENT: No facial asymmetry, EOMI,     Neck supple .  Chest: Clear to auscultation  bilaterally.  CVS: S1, S2 no murmurs, no S3.Regular rate.    Ext: No edema  MS: markedly decreased  ROM lumbar  spine,  adequate in shoulders, and knees.  Skin: Intact, no ulcerations or rash noted.  Psych: Good eye contact, . Memory intact  mildly anxious not depressed appearing.  CNS: CN 2-12 intact, grade 4 power in RLE , grade 5 in all other extremities, reduced sensation in RLE Assessment & Plan  Immunization due After obtaining informed consent, the vaccine is  administered , with no adverse effect noted at the time of administration.   Screening for lipoid disorders Hyperlipidemia:Low fat diet discussed and encouraged.   Lipid Panel  Lab Results  Component Value Date   CHOL 166 07/06/2023   HDL 60 07/06/2023   LDLCALC 91 07/06/2023   TRIG 78 07/06/2023   CHOLHDL 2.8 07/06/2023     Updated lab needed at/ before next visit.   Stress and adjustment reaction Ongoing stress physically, mentally and financially, support from Mother in particular is getting her through one day at a time, will do therapy when she  gets the time or a chance  Radiculopathy of lumbar region Debilitating pain rated at 10 plus despite surgery and pain management with weakness , numbness of RLE and urinary incontinence and inability to carry out daily functions including to work , upcoming appt at tertiary center   Elevated alkaline phosphatase level Needs GI eval  Hypothyroidism Controlled, no change in medication

## 2024-07-04 NOTE — Patient Instructions (Addendum)
 F/U mid to end Januaray    Flu vaccine today  Pls contact GI for appt to be scheduled re elevated Alk phos level ( printed letter with number to call will be provided)   Be careful not to  fall  Fasting lipid, cmp and EGFr and TSH 3 to 5 days before next appt  Thanks for choosing [facility], we consider it a privelige to serve you.

## 2024-07-12 ENCOUNTER — Encounter: Payer: Self-pay | Admitting: Family Medicine

## 2024-07-12 DIAGNOSIS — Z23 Encounter for immunization: Secondary | ICD-10-CM | POA: Insufficient documentation

## 2024-07-12 DIAGNOSIS — Z13220 Encounter for screening for lipoid disorders: Secondary | ICD-10-CM | POA: Insufficient documentation

## 2024-07-12 NOTE — Assessment & Plan Note (Signed)
 After obtaining informed consent, the vaccine is  administered , with no adverse effect noted at the time of administration.

## 2024-07-12 NOTE — Assessment & Plan Note (Signed)
 Controlled, no change in medication

## 2024-07-12 NOTE — Assessment & Plan Note (Signed)
 Debilitating pain rated at 10 plus despite surgery and pain management with weakness , numbness of RLE and urinary incontinence and inability to carry out daily functions including to work , upcoming appt at tertiary center

## 2024-07-12 NOTE — Assessment & Plan Note (Signed)
Needs GI eval.  

## 2024-07-12 NOTE — Assessment & Plan Note (Signed)
 Ongoing stress physically, mentally and financially, support from Mother in particular is getting her through one day at a time, will do therapy when she  gets the time or a chance

## 2024-07-12 NOTE — Assessment & Plan Note (Signed)
 Hyperlipidemia:Low fat diet discussed and encouraged.   Lipid Panel  Lab Results  Component Value Date   CHOL 166 07/06/2023   HDL 60 07/06/2023   LDLCALC 91 07/06/2023   TRIG 78 07/06/2023   CHOLHDL 2.8 07/06/2023     Updated lab needed at/ before next visit.

## 2024-08-11 ENCOUNTER — Ambulatory Visit: Admitting: Neurology

## 2024-08-18 ENCOUNTER — Ambulatory Visit: Admitting: Neurology

## 2024-08-18 ENCOUNTER — Telehealth: Payer: Self-pay

## 2024-08-18 DIAGNOSIS — G43E11 Chronic migraine with aura, intractable, with status migrainosus: Secondary | ICD-10-CM | POA: Diagnosis not present

## 2024-08-18 MED ORDER — ONABOTULINUMTOXINA 100 UNITS IJ SOLR
200.0000 [IU] | Freq: Once | INTRAMUSCULAR | Status: AC
  Administered 2024-08-18: 155 [IU] via INTRAMUSCULAR

## 2024-08-18 MED ORDER — ONDANSETRON HCL 4 MG PO TABS: 4.0000 mg | ORAL_TABLET | Freq: Three times a day (TID) | ORAL | 6 refills | Status: AC | PRN

## 2024-08-18 NOTE — Progress Notes (Signed)

## 2024-08-18 NOTE — Telephone Encounter (Signed)
 Per Dr. Skeet ok to send Zofran  to [facility].

## 2024-08-22 ENCOUNTER — Encounter: Payer: Self-pay | Admitting: Family Medicine

## 2024-08-22 ENCOUNTER — Telehealth (INDEPENDENT_AMBULATORY_CARE_PROVIDER_SITE_OTHER)

## 2024-08-22 ENCOUNTER — Ambulatory Visit: Payer: Self-pay

## 2024-08-22 DIAGNOSIS — U071 COVID-19: Secondary | ICD-10-CM | POA: Diagnosis not present

## 2024-08-22 MED ORDER — PROMETHAZINE-DM 6.25-15 MG/5ML PO SYRP: 5.0000 mL | ORAL_SOLUTION | Freq: Four times a day (QID) | ORAL | 0 refills | Status: DC | PRN

## 2024-08-22 MED ORDER — NIRMATRELVIR/RITONAVIR (PAXLOVID)TABLET: 3.0000 | ORAL_TABLET | Freq: Two times a day (BID) | ORAL | 0 refills | Status: AC

## 2024-08-22 NOTE — Progress Notes (Signed)
 Virtual Visit via Video Note  I connected with Alejandra Barr on 08/22/24 at  3:00 PM EDT by a video enabled telemedicine application and verified that I am speaking with the correct person using two identifiers.  Patient Location: Home Provider Location: Office/Clinic  I discussed the limitations, risks, security, and privacy concerns of performing an evaluation and management service by video and the availability of in person appointments. I also discussed with the patient that there may be a patient responsible charge related to this service. The patient expressed understanding and agreed to proceed.  Subjective: PCP: Antonetta Rollene BRAVO, MD  Chief Complaint  Patient presents with   Medical Management of Chronic Issues    Covid   HPI   ROS: Per HPI  Current Outpatient Medications:    acetaminophen  (TYLENOL ) 500 MG tablet, Take 1,000 mg by mouth every 6 (six) hours as needed for mild pain or moderate pain., Disp: , Rfl:    albuterol  (VENTOLIN  HFA) 108 (90 Base) MCG/ACT inhaler, Inhale 2 puffs into the lungs every 6 (six) hours as needed for wheezing or shortness of breath., Disp: 1 each, Rfl: 0   Aspirin -Caffeine  845-65 MG PACK, Take 1 Package by mouth 3 (three) times daily as needed (headache/pain)., Disp: 56 each, Rfl:    botulinum toxin Type A  (BOTOX ) 200 units injection, Inject 155 units IM into multiple site in the face,neck and head once every 90 days, Disp: 1 each, Rfl: 4   botulinum toxin Type A  (BOTOX ) 200 units injection, INJECT 155 UNITS EVERY THREE MONTHS FOR CHRONIC MIGRAINES., Disp: 1 each, Rfl: 4   Cholecalciferol (VITAMIN D ) 50 MCG (2000 UT) CAPS, Take 1 capsule (2,000 Units total) by mouth daily., Disp: 30 capsule, Rfl: 3   Cyanocobalamin  (B-12) 1000 MCG SUBL, Place 1,000 mcg under the tongue daily., Disp: 90 tablet, Rfl: 3   cyanocobalamin  (VITAMIN B12) 1000 MCG/ML injection, Inject 1 mL (1,000 mcg total) into the muscle daily for 7 days, THEN 1 mL (1,000 mcg  total) 2 (two) times a week., Disp: 15 mL, Rfl: 3   cyclobenzaprine  (FLEXERIL ) 5 MG tablet, Take 5 mg by mouth 3 (three) times daily as needed for muscle spasms., Disp: , Rfl:    diclofenac  Sodium (VOLTAREN ) 1 % GEL, Apply 4 g topically 4 (four) times daily., Disp: 4 g, Rfl: 3   DULoxetine  (CYMBALTA ) 30 MG capsule, Take 30 mg by mouth 2 (two) times daily., Disp: , Rfl:    ferrous sulfate  325 (65 FE) MG EC tablet, Take 1 tablet (325 mg total) by mouth daily with breakfast., Disp: 90 tablet, Rfl: 3   folic acid  (V-R FOLIC ACID ) 400 MCG tablet, Take 1 tablet (400 mcg total) by mouth daily., Disp: 90 tablet, Rfl: 3   gabapentin  (NEURONTIN ) 400 MG capsule, Take 400 mg by mouth 3 (three) times daily., Disp: , Rfl:    levothyroxine  (SYNTHROID ) 75 MCG tablet, Take 1 tablet (75 mcg total) by mouth daily before breakfast., Disp: 90 tablet, Rfl: 0   Lidocaine  (ZTLIDO ) 1.8 % PTCH, Place 1 patch onto the skin every 12 (twelve) hours as needed (pain)., Disp: , Rfl:    metoprolol  succinate (TOPROL -XL) 25 MG 24 hr tablet, Take 2 tablets by mouth daily., Disp: 60 tablet, Rfl: 0   NEEDLE, DISP, 25 G 25G X 1-1/2 MISC, To be used with B 12 injections twice weekly, Disp: 20 each, Rfl: 2   nirmatrelvir /ritonavir  (PAXLOVID ) 20 x 150 MG & 10 x 100MG  TABS, Take 3 tablets  by mouth 2 (two) times daily for 5 days. (Take nirmatrelvir  150 mg two tablets twice daily for 5 days and ritonavir  100 mg one tablet twice daily for 5 days) Patient GFR is 117, Disp: 30 tablet, Rfl: 0   ondansetron  (ZOFRAN ) 4 MG tablet, Take 1 tablet (4 mg total) by mouth every 8 (eight) hours as needed for nausea or vomiting., Disp: 20 tablet, Rfl: 6   promethazine -dextromethorphan (PROMETHAZINE -DM) 6.25-15 MG/5ML syrup, Take 5 mLs by mouth 4 (four) times daily as needed., Disp: 118 mL, Rfl: 0   silver  sulfADIAZINE  (SILVADENE ) 1 % cream, Apply 1 Application topically 2 (two) times daily., Disp: 50 g, Rfl: 2   topiramate  (TOPAMAX ) 100 MG tablet, Take 1  tablet by mouth twice daily., Disp: 180 tablet, Rfl: 0   UBRELVY  100 MG TABS, Take 1 tablet (100 mg total) by mouth as needed. May repeat after 2 hours.  Maximum 2 tablets in 24 hours., Disp: 16 tablet, Rfl: 5   Vitamin D , Ergocalciferol , (DRISDOL ) 1.25 MG (50000 UNIT) CAPS capsule, Take 1 capsule (50,000 Units total) by mouth once a week., Disp: 5 capsule, Rfl: 11  Observations/Objective: There were no vitals filed for this visit. Physical Exam  Assessment and Plan: COVID-19 -     nirmatrelvir /ritonavir ; Take 3 tablets by mouth 2 (two) times daily for 5 days. (Take nirmatrelvir  150 mg two tablets twice daily for 5 days and ritonavir  100 mg one tablet twice daily for 5 days) Patient GFR is 117  Dispense: 30 tablet; Refill: 0 -     Promethazine -DM; Take 5 mLs by mouth 4 (four) times daily as needed.  Dispense: 118 mL; Refill: 0    Follow Up Instructions: No follow-ups on file.   I discussed the assessment and treatment plan with the patient. The patient was provided an opportunity to ask questions, and all were answered. The patient agreed with the plan and demonstrated an understanding of the instructions.   The patient was advised to call back or seek an in-person evaluation if the symptoms worsen or if the condition fails to improve as anticipated.  The above assessment and management plan was discussed with the patient. The patient verbalized understanding of and has agreed to the management plan.   Leita Longs, FNP

## 2024-08-22 NOTE — Telephone Encounter (Signed)
 Scheduled

## 2024-08-22 NOTE — Telephone Encounter (Signed)
 FYI Only or Action Required?: Action required by provider: medication refill request.  Patient was last seen in primary care on 07/04/2024 by Antonetta Rollene BRAVO, MD.  Called Nurse Triage reporting Covid Positive.  Symptoms began several days ago.  Interventions attempted: Prescription medications: ubrelvy  for the headache.  Symptoms are: gradually worsening.  Triage Disposition: Call PCP Within 24 Hours  Patient/caregiver understands and will follow disposition?: No, wishes to speak with PCP   Pt is requesting Dr. Antonetta to send her something in for Covid.      Copied from CRM [id number]. Topic: Clinical - Red Word Triage >> Aug 22, 2024 12:21 PM Nathanel BROCKS wrote: Red Word that prompted transfer to Nurse Triage: pt has tested positive for covid. Pt is a cardiac pt with asthma and is wanting to know if she can get some medication. Reason for Disposition  [1] HIGH RISK patient (e.g., weak immune system, 65 years and older, obesity with BMI 30 or higher, pregnant, chronic lung disease) AND [2] COVID symptoms (e.g., cough, fever)  (Exceptions: Already seen by PCP and no new or worsening symptoms.)  Answer Assessment - Initial Assessment Questions Pt states she tested covid positive. She states she has asthma and cardiac arrhythmias with an ablation previously. She states that in 2022 when she had covid, Dr. Antonetta sent her in a medication. She is requesting the same thing be done now.    1. SYMPTOMS: What is your main symptom or concern? (e.g., cough, fever, shortness of breath, muscle aches)     Fever, sore throat, cough, slight wheezing 2. ONSET: When did the symptoms start?      2 days ago 3. COUGH: Do you have a cough? If Yes, ask: How bad is the cough?       yes 4. FEVER: Do you have a fever? If Yes, ask: What is your temperature, how was it measured, and when did it start?     101.5 5. BREATHING DIFFICULTY: Are you having any difficulty breathing? (e.g., normal;  shortness of breath, wheezing, unable to speak)      Wheezing slightly, pain with coughing 6. BETTER-SAME-WORSE: Are you getting better, staying the same or getting worse compared to yesterday?  If getting worse, ask, In what way?     Worse today  7. OTHER SYMPTOMS: Do you have any other symptoms?  (e.g., chills, fatigue, headache, loss of smell or taste, muscle pain, sore throat)     Headache, (ubrelvy  this am), sore throat 8. COVID-19 DIAGNOSIS: How do you know that you have COVID? (e.g., positive lab test or self-test, diagnosed by doctor or NP/PA, symptoms after exposure).     Home test 9. COVID-19 EXPOSURE: Was there any known exposure to COVID before the symptoms began?      unknown  11. HIGH RISK DISEASE: Do you have any chronic medical problems? (e.g., asthma, heart or lung disease, weak immune system, obesity, etc.)       Yes asthma and cardiac arrhythmia  Protocols used: COVID-19 - Diagnosed or Suspected-A-AH

## 2024-08-23 ENCOUNTER — Ambulatory Visit: Admitting: Physician Assistant

## 2024-09-04 ENCOUNTER — Encounter: Payer: Self-pay | Admitting: Radiology

## 2024-10-08 ENCOUNTER — Other Ambulatory Visit: Payer: Self-pay | Admitting: Internal Medicine

## 2024-10-17 NOTE — Progress Notes (Unsigned)
 NEUROLOGY FOLLOW UP OFFICE NOTE  JANNETH KRASNER [id number]  Assessment/Plan:   Migraine without aura, without status migrainosus, not intractable Postlaminectomy syndrome   Migraine prevention:  Botox  every 3 months; topiramate  100mg  twice daily Migraine rescue:  Ubrelvy  100mg .  Zofran  for nausea.   Limit use of pain relievers to no more than 2 days out of week to prevent risk of rebound or medication-overuse headache. Keep headache diary Follow up in one year for routine visit ***    Subjective:  CHINYERE GALIANO is a 38 year old female with hypothyroidism, asthma, depression, anxiety and history of a flutter s/p RFA (2004) who follows up for migraines.  UPDATE: She has been on Botox  for about a year and a  half.  *** Intensity:  severe Duration:  3 hours with Ubrelvy  Frequency:  2 days a week (down from daily)  Current NSAIDS/analgesics:  acetaminophen , hydromorphone  (back pain), lidocaine  patch (back pain) Current triptans:  none Current ergotamine:  none Current anti-emetic:  none Current muscle relaxants:  Flexeril  5mg  TID PRN Current Antihypertensive medications:  metoprolol  succinate 25mg  BID Current Antidepressant medications:  duloxetine  30mg  BID Current Anticonvulsant medications:  topiramate  100mg  BID, gabapentin  300mg  BID Current anti-CGRP:  Ubrelvy  100mg   Current Vitamins/Herbal/Supplements:  B12, D Current Antihistamines/Decongestants:  none Other therapy:  none Birth control:  none Other medications:  Synthroid    Caffeine :  No coffee.  BC powder Alcohol:  no Smoker:  former smoker Diet:  40 oz water daily.  Drinks diet green tea.  Tries not to skip meals.   Exercise:  PT twice a week s/p back surgeries.  Limited mobility.   Pain:  Post-laminectomy syndrome.  2 failed back surgeries.  Waiting on spinal nerve stimulator. Depression:  difficult due to pain preventing her from playing with her children; Anxiety:  no Sleep hygiene:  wakes up  frequently related to back pain  HISTORY: Onset:  42-22 years old.  Worse when she turned 29.  Location:  usually right hemicrania Quality:  pounding, throbbing, sharp Intensity:  severe.   Aura:  absent Prodrome:  absent Associated symptoms:  phonophobia, photophobia, nausea, vomiting.  She denies associated unilateral numbness or weakness. Duration:  4-5 hours, 3 hours with Ubrelvy  Frequency:  Prior to Botox : daily; On Botox : 1 to 2 days a week Triggers:  menses/hormonal Relieving factors:  rest in dark and quiet room Activity:  aggravates Frequency of analgesics:  BC powder 3 times daily (she is avoiding to take Ubrelvy  because she is unsure how it interacts with her pain medications)  She missed her last two doses of Botox  because her previous neurologist retired.  Last Botox  was in Botox .  Migraines are daily again.  Eye exams unremarkable  Remote MRI of brain on 05/23/2006 personally reviewed was limited by motion but overall unremarkable.    History of chronic neck and back pain radiating into both legs.  MRI of cervical spine on 07/01/2021 personally reviewed was unremarkable.  Found to have a a meningioma in the caudal thoracic spine.  Underwent T11 laminectomy and tumor resection in November 2023.  Continued to have left leg pain and numbness.  MRI of lumbar spine revealed small left subarticular disc protgrusion at L4-5 causing narrowing in the left lateral recess .  Underwent minimally invasive L4-5 discectomy in March 2024.    Past NSAIDS/analgesics:  Fioricet, ibuprofen , BC powder Past abortive triptans:  rizatriptan , sumatriptan  tab Past abortive ergotamine:  none Past muscle relaxants:  none Past anti-emetic:  Reglan   Past antihypertensive medications:  propranolol Past antidepressant medications:  Nortriptyline, amitriptyline, venlafaxine, Wellbutrin  Past anticonvulsant medications:  Depakote Past anti-CGRP:  none Past vitamins/Herbal/Supplements:  CoQ10, magnesium   oxide Past antihistamines/decongestants:  none Other past therapies:  Botox  (effective), trigger point injections   Family history of headache:  mother (migraines), father (headaches)  PAST MEDICAL HISTORY: Past Medical History:  Diagnosis Date   Asthma    Atrial flutter (HCC)    Status post RFA by Dr. 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     albuterol  (VENTOLIN  HFA) 108 (90 Base) MCG/ACT inhaler Inhale 2 puffs into the lungs every 6 (six) hours as needed for wheezing or shortness of breath. 1 each 0   Aspirin -Caffeine  845-65 MG PACK Take 1 Package by mouth 3 (three) times daily as needed (headache/pain). 56 each    botulinum toxin Type A  (BOTOX ) 200 units injection Inject 155 units IM into multiple site in the face,neck and head once every 90 days 1 each 4   botulinum toxin Type A  (BOTOX ) 200 units injection INJECT 155 UNITS EVERY THREE MONTHS FOR CHRONIC MIGRAINES. 1 each 4   Cholecalciferol (VITAMIN D ) 50 MCG (2000 UT) CAPS Take 1 capsule (2,000 Units total) by mouth daily. 30 capsule 3   Cyanocobalamin  (B-12) 1000 MCG SUBL Place 1,000  mcg under the tongue daily. 90 tablet 3   cyanocobalamin  (VITAMIN B12) 1000 MCG/ML injection Inject 1 mL (1,000 mcg total) into the muscle daily for 7 days, THEN 1 mL (1,000 mcg total) 2 (two) times a week. 15 mL 3   cyclobenzaprine  (FLEXERIL ) 5 MG tablet Take 5 mg by mouth 3 (three) times daily as needed for muscle spasms.     diclofenac  Sodium (VOLTAREN ) 1 % GEL Apply 4 g topically 4 (four) times daily. 4 g 3   DULoxetine  (CYMBALTA ) 30 MG capsule Take 30 mg by mouth 2 (two) times daily.     ferrous sulfate  325 (65 FE) MG EC tablet Take 1 tablet (325 mg total) by mouth daily with breakfast. 90 tablet 3   folic acid  (V-R FOLIC ACID ) 400 MCG tablet Take 1 tablet (400 mcg total) by mouth daily. 90 tablet 3   gabapentin  (NEURONTIN ) 400 MG capsule Take 400 mg by mouth 3 (three) times daily.     levothyroxine  (SYNTHROID ) 75 MCG tablet Take 1 tablet (75 mcg total) by mouth daily before breakfast. 90 tablet 0   Lidocaine  (ZTLIDO ) 1.8 % PTCH Place 1 patch onto the skin every 12 (twelve) hours as needed (pain).  metoprolol  succinate (TOPROL -XL) 25 MG 24 hr tablet Take 2 tablets (50 mg) by mouth once daily. You will need to call the office at [phone] to schedule a follow up for all future refills. 60 tablet 0   NEEDLE, DISP, 25 G 25G X 1-1/2 MISC To be used with B 12 injections twice weekly 20 each 2   ondansetron  (ZOFRAN ) 4 MG tablet Take 1 tablet (4 mg total) by mouth every 8 (eight) hours as needed for nausea or vomiting. 20 tablet 6   promethazine -dextromethorphan (PROMETHAZINE -DM) 6.25-15 MG/5ML syrup Take 5 mLs by mouth 4 (four) times daily as needed. 118 mL 0   silver  sulfADIAZINE  (SILVADENE ) 1 % cream Apply 1 Application topically 2 (two) times daily. 50 g 2   topiramate  (TOPAMAX ) 100 MG tablet Take 1 tablet by mouth twice daily. 180 tablet 0   UBRELVY  100 MG TABS Take 1 tablet (100 mg total) by mouth as needed. May repeat after 2 hours.  Maximum 2 tablets in 24 hours. 16 tablet 5   Vitamin  D, Ergocalciferol , (DRISDOL ) 1.25 MG (50000 UNIT) CAPS capsule Take 1 capsule (50,000 Units total) by mouth once a week. 5 capsule 11   No current facility-administered medications on file prior to visit.     ALLERGIES: Allergies  Allergen Reactions   Ciprofloxacin Anaphylaxis   Morphine  And Codeine Anaphylaxis    Pt states she can tolerate hydromorphone    Penicillins Anaphylaxis    Has patient had a PCN reaction causing immediate rash, facial/tongue/throat swelling, SOB or lightheadedness with hypotension: No Has patient had a PCN reaction causing severe rash involving mucus membranes or skin necrosis: No Has patient had a PCN reaction that required hospitalization: No Has patient had a PCN reaction occurring within the last 10 years: No If all of the above answers are NO, then may proceed with Cephalosporin use.    FAMILY HISTORY: Family History  Problem Relation Age of Onset   Migraines Mother    Fibromyalgia Mother        Chronic pelvic pain    Hypothyroidism Mother    Obesity Mother    Heart defect Mother        Supraventricular, tachycardia /tachypalpation    Arrhythmia Mother    Breast cancer Mother 26   Stroke Maternal Grandmother    Hypertension Maternal Grandmother    Diabetes Maternal Grandmother    Hypertension Maternal Grandfather    Heart disease Paternal Grandmother    Hypertension Paternal Grandmother    Heart disease Paternal Grandfather    Hypertension Paternal Grandfather       Objective:  *** General: No acute distress.  Patient appears well-groomed.   Head:  Normocephalic/atraumatic Neck:  Supple.  No paraspinal tenderness.  Full range of motion. Heart:  Regular rate and rhythm. Neuro:  Alert and oriented.  Speech fluent and not dysarthric.  Language intact.  CN II-XII intact.  Bulk and tone normal.  Muscle strength 5/5 throughout.  Sensation to light touch intact.  Deep tendon reflexes 2+ throughout, toes downgoing.  Gait normal.  Romberg  negative.    Juliene Dunnings, DO  CC: Rollene Pesa, MD

## 2024-10-18 ENCOUNTER — Encounter: Payer: Self-pay | Admitting: Internal Medicine

## 2024-10-18 ENCOUNTER — Encounter: Payer: Self-pay | Admitting: Neurology

## 2024-10-18 ENCOUNTER — Ambulatory Visit: Admitting: Neurology

## 2024-10-18 ENCOUNTER — Ambulatory Visit: Attending: Internal Medicine | Admitting: Internal Medicine

## 2024-10-18 VITALS — BP 111/77 | HR 100 | Ht 61.0 in | Wt 176.2 lb

## 2024-10-18 VITALS — BP 108/68 | HR 107 | Ht 61.0 in | Wt 175.4 lb

## 2024-10-18 DIAGNOSIS — I4892 Unspecified atrial flutter: Secondary | ICD-10-CM | POA: Diagnosis not present

## 2024-10-18 DIAGNOSIS — R0602 Shortness of breath: Secondary | ICD-10-CM | POA: Insufficient documentation

## 2024-10-18 DIAGNOSIS — I493 Ventricular premature depolarization: Secondary | ICD-10-CM | POA: Diagnosis not present

## 2024-10-18 DIAGNOSIS — G43101 Migraine with aura, not intractable, with status migrainosus: Secondary | ICD-10-CM

## 2024-10-18 DIAGNOSIS — R000 Tachycardia, unspecified: Secondary | ICD-10-CM | POA: Diagnosis not present

## 2024-10-18 MED ORDER — UBRELVY 100 MG PO TABS: 100.0000 mg | ORAL_TABLET | ORAL | 5 refills | Status: AC | PRN

## 2024-10-18 MED ORDER — PREDNISONE 10 MG PO TABS: ORAL_TABLET | ORAL | 0 refills | Status: DC

## 2024-10-18 MED ORDER — SUMATRIPTAN SUCCINATE 6 MG/0.5ML ~~LOC~~ SOLN: SUBCUTANEOUS | 5 refills | Status: AC

## 2024-10-18 MED ORDER — IVABRADINE HCL 5 MG PO TABS: 5.0000 mg | ORAL_TABLET | Freq: Two times a day (BID) | ORAL | 5 refills | Status: AC

## 2024-10-18 MED ORDER — TOPIRAMATE 100 MG PO TABS: 100.0000 mg | ORAL_TABLET | Freq: Two times a day (BID) | ORAL | 1 refills | Status: AC

## 2024-10-18 NOTE — Patient Instructions (Addendum)
 Botox  and topiramate  Ubrelvy  first line.  May try sumatriptan  injection as second line. Prednisone  taper to break current migraine

## 2024-10-18 NOTE — Patient Instructions (Addendum)
 Medication Instructions:  Your physician has recommended you make the following change in your medication:  Start taking Ivabradine  5 mg twice daily Continue taking all other medications as prescribed   Labwork: None  Testing/Procedures: None  Follow-Up: Your physician recommends that you schedule a follow-up appointment in: 1 year. You will receive a reminder call in about 8 months reminding you to schedule your appointment. If you don't receive this call, please contact our office.   Any Other Special Instructions Will Be Listed Below (If Applicable). Thank you for choosing [facility]!     If you need a refill on your cardiac medications before your next appointment, please call your pharmacy.

## 2024-10-18 NOTE — Progress Notes (Signed)
 Cardiology Office Note  Date: 10/18/2024   ID: Alejandra Barr, Alejandra Barr 01-Jun-1986, MRN [id number]  PCP:  Antonetta Rollene BRAVO, MD  Cardiologist:  Diannah SHAUNNA Maywood, MD Electrophysiologist:  None   Reason for Office Visit: Follow-up of atrial flutter status post ablation 20 years ago, in 2004  History of Present Illness: Alejandra Barr is a 38 y.o. female known to have atrial flutter s/p ablation in 2004, iron  deficiency anemia, B12 deficiency, anxiety/depression presented to cardiology clinic for follow-up visit.  Patient underwent successful RFA ablation of atrial flutter by Dr. Waddell in 2004.  She does have a family history of arrhythmias, atrial fibrillation with multiple ablations in her mother who was a longstanding patient of Dr. Fernande and her grandmother had arrhythmias as well. Last event monitor from 2020 showed normal sinus rhythm to sinus tachycardia, isolated PACs and 2.6% PVC's without any arrhythmias. Average HR 99 bpm.  Patient was also having back pain for the last 8 years but was recently diagnosed with Schwannoma of spine and currently underwent surgeries.   Patient is here today for follow-up visit.  Patient requesting for preop clearance for spine surgery.  She previously underwent multiple spine surgeries and tolerated procedure very well.  Not able to ambulate long distances due to spine issues.  She has intermittent palpitations, EKG today showed sinus tachycardia.  She reported that her heart rate usually stays more than 90 bpm most of the time.  Does not have any angina or DOE.  No syncope, leg swelling.  Past Medical History:  Diagnosis Date   Asthma    Atrial flutter (HCC)    Status post RFA by Dr. 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    Iron  deficiency    Iron  deficiency anemia 10/02/2010   Qualifier: Diagnosis of  By: Antonetta MD, Margaret     Low vitamin B12 level 07/10/2013   Overview:  Last Assessment & Plan:  Being treated through hematology   Newborn product of in vitro fertilization (IVF) pregnancy    Obesity    PONV (postoperative nausea and vomiting)     Past Surgical History:  Procedure Laterality Date   Bilateral eustachian tube placement on 4 different occasions     CARDIAC ELECTROPHYSIOLOGY MAPPING AND ABLATION  2004   for aflutter/afib   CARPAL TUNNEL RELEASE Bilateral 05/2018   CESAREAN SECTION N/A 05/26/2017   Procedure: CESAREAN SECTION;  Surgeon: Dannielle Bouchard, DO;  Location: [facility];  Service: Obstetrics;  Laterality: N/A;   CESAREAN SECTION N/A 03/01/2021   Procedure: Repeat CESAREAN SECTION;  Surgeon: Gorge Ade, MD;  Location: [facility];  Service: Obstetrics;  Laterality: N/A;  EDD: 03/07/21   CESAREAN SECTION MULTI-GESTATIONAL N/A 02/10/2019   Procedure: Repeat CESAREAN SECTION MULTI-GESTATIONAL;  Surgeon: Gorge Ade, MD;  Location: [facility];  Service: Obstetrics;  Laterality: N/A;  EDD: 03/15/19 Allergy: Penicillin, Morphine , Cipro   CHOLECYSTECTOMY  2003   COLONOSCOPY N/A 11/29/2013   Procedure: COLONOSCOPY;  Surgeon: Claudis RAYMOND Rivet, MD;  Location: [facility];  Service: Endoscopy;  Laterality: N/A;  120-rescheduled to 300 Ann notified pt   GASTRIC BYPASS  2006   LAMINECTOMY N/A 09/10/2022   Procedure: 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;  Surgeon: Cheryle Debby LABOR, MD;  Location: [facility];  Service: Neurosurgery;  Laterality: N/A;  RM 20 to follow   LUMBAR LAMINECTOMY/ DECOMPRESSION WITH MET-RX Left 01/21/2023   Procedure: Left Lumbar Four-Five MIS Discectomy;  Surgeon: Cheryle Debby LABOR, MD;  Location: [facility] OR;  Service: Neurosurgery;  Laterality: Left;    TONSILLECTOMY  1990    Current Outpatient Medications  Medication Sig Dispense Refill   acetaminophen  (TYLENOL ) 500 MG tablet Take 1,000 mg by mouth every 6 (six) hours as needed for mild pain or moderate pain.     albuterol  (VENTOLIN  HFA) 108 (90 Base) MCG/ACT inhaler Inhale 2 puffs into the lungs every 6 (six) hours as needed for wheezing or shortness of breath. 1 each 0   Aspirin -Caffeine  845-65 MG PACK Take 1 Package by mouth 3 (three) times daily as needed (headache/pain). 56 each    botulinum toxin Type A  (BOTOX ) 200 units injection Inject 155 units IM into multiple site in the face,neck and head once every 90 days 1 each 4   botulinum toxin Type A  (BOTOX ) 200 units injection INJECT 155 UNITS EVERY THREE MONTHS FOR CHRONIC MIGRAINES. 1 each 4   Cholecalciferol (VITAMIN D ) 50 MCG (2000 UT) CAPS Take 1 capsule (2,000 Units total) by mouth daily. 30 capsule 3   Cyanocobalamin  (B-12) 1000 MCG SUBL Place 1,000 mcg under the tongue daily. 90 tablet 3   cyanocobalamin  (VITAMIN B12) 1000 MCG/ML injection Inject 1 mL (1,000 mcg total) into the muscle daily for 7 days, THEN 1 mL (1,000 mcg total) 2 (two) times a week. 15 mL 3   cyclobenzaprine  (FLEXERIL ) 5 MG tablet Take 5 mg by mouth 3 (three) times daily as needed for muscle spasms.     diclofenac  Sodium (VOLTAREN ) 1 % GEL Apply 4 g topically 4 (four) times daily. 4 g 3   DULoxetine  (CYMBALTA ) 30 MG capsule Take 30 mg by mouth 2 (two) times daily.     ferrous sulfate  325 (65 FE) MG EC tablet Take 1 tablet (325 mg total) by mouth daily with breakfast. 90 tablet 3   folic acid  (V-R FOLIC ACID ) 400 MCG tablet Take 1 tablet (400 mcg total) by mouth daily. 90 tablet 3   gabapentin  (NEURONTIN ) 400 MG capsule Take 400 mg by mouth 3 (three) times daily.     levothyroxine  (SYNTHROID ) 75 MCG tablet Take 1 tablet (75 mcg total) by mouth daily before breakfast. 90 tablet 0   Lidocaine  (ZTLIDO ) 1.8 % PTCH Place 1 patch onto the skin every 12 (twelve) hours as  needed (pain).     metoprolol  succinate (TOPROL -XL) 25 MG 24 hr tablet Take 2 tablets (50 mg) by mouth once daily. You will need to call the office at [phone] to schedule a follow up for all future refills. 60 tablet 0   NEEDLE, DISP, 25 G 25G X 1-1/2 MISC To be used with B 12 injections twice weekly 20 each 2   ondansetron  (ZOFRAN ) 4 MG tablet Take 1 tablet (4 mg total) by mouth every 8 (eight) hours as needed for nausea or vomiting. 20 tablet 6   promethazine -dextromethorphan (PROMETHAZINE -DM) 6.25-15 MG/5ML syrup Take 5 mLs by mouth 4 (four) times daily as needed. 118 mL 0   silver  sulfADIAZINE  (SILVADENE ) 1 % cream Apply 1 Application topically 2 (two) times daily. 50 g 2   topiramate  (TOPAMAX ) 100 MG tablet Take 1  tablet by mouth twice daily. 180 tablet 0   UBRELVY  100 MG TABS Take 1 tablet (100 mg total) by mouth as needed. May repeat after 2 hours.  Maximum 2 tablets in 24 hours. 16 tablet 5   Vitamin D , Ergocalciferol , (DRISDOL ) 1.25 MG (50000 UNIT) CAPS capsule Take 1 capsule (50,000 Units total) by mouth once a week. 5 capsule 11   No current facility-administered medications for this visit.   Allergies:  Ciprofloxacin, Morphine  and codeine, and Penicillins   Social History: The patient  reports that she quit smoking about 9 years ago. Her smoking use included cigarettes. She started smoking about 11 years ago. She has a 1.1 pack-year smoking history. She has never used smokeless tobacco. She reports that she does not drink alcohol and does not use drugs.   Family History: The patient's family history includes Arrhythmia in her mother; Breast cancer (age of onset: 83) in her mother; Diabetes in her maternal grandmother; Fibromyalgia in her mother; Heart defect in her mother; Heart disease in her paternal grandfather and paternal grandmother; Hypertension in her maternal grandfather, maternal grandmother, paternal grandfather, and paternal grandmother; Hypothyroidism in her mother;  Migraines in her mother; Obesity in her mother; Stroke in her maternal grandmother.   ROS:  Please see the history of present illness. Otherwise, complete review of systems is positive for none.  All other systems are reviewed and negative.   Physical Exam: VS:  There were no vitals taken for this visit., BMI There is no height or weight on file to calculate BMI.  Wt Readings from Last 3 Encounters:  07/04/24 167 lb 1.3 oz (75.8 kg)  06/26/24 174 lb (78.9 kg)  03/23/24 184 lb 1.3 oz (83.5 kg)    General: Patient appears comfortable at rest. HEENT: Conjunctiva and lids normal, oropharynx clear with moist mucosa. Neck: Supple, no elevated JVP or carotid bruits, no thyromegaly. Lungs: Clear to auscultation, nonlabored breathing at rest. Cardiac: Regular rate and rhythm, no S3 or significant systolic murmur, no pericardial rub. Abdomen: Soft, nontender, no hepatomegaly, bowel sounds present, no guarding or rebound. Extremities: No pitting edema, distal pulses 2+. Skin: Warm and dry. Musculoskeletal: No kyphosis. Neuropsychiatric: Alert and oriented x3, affect grossly appropriate.  ECG:  An ECG dated 09/2022 was personally reviewed today and demonstrated:  NSR  Recent Labwork: 01/21/2024: Hemoglobin 12.0; Platelets 209 03/23/2024: ALT 24; AST 21; BUN 14; Creatinine, Ser 0.60; Potassium 4.1; Sodium 139; TSH 3.640     Component Value Date/Time   CHOL 166 07/06/2023 1403   TRIG 78 07/06/2023 1403   HDL 60 07/06/2023 1403   CHOLHDL 2.8 07/06/2023 1403   CHOLHDL 3.1 03/10/2022 1148   VLDL 15 03/10/2022 1148   LDLCALC 91 07/06/2023 1403   LDLCALC 90 05/10/2018 0805    Assessment and Plan:  # Atrial flutter s/p RF ablation in 2004, currently in normal sinus rhythm # 2.6% PVC burden per 2020 event monitor yeah morning Friday the whole day the whole day before the procedure and also on the day of the procedure 3 doses - Continue metoprolol  succinate 25 mg twice daily - No indication for  systemic anticoagulation. - Normal echocardiogram in 2024.  # Sinus tachycardia, symptomatic - Continue metoprolol  succinate 25 mg twice daily.  Add ivabradine  5 mg twice daily. - Patient lost her husband this past Monday.  And she has kids.  # Preop cardiac risk stratification for spine surgery under general anesthesia - Low risk.  No further cardiac workup needed at  this time.  She previously underwent spine surgeries that were uneventful.  I spent 30 minutes reviewing prior medical records, more than 3 labs, discussion and documentation.    Medication Adjustments/Labs and Tests Ordered: Current medicines are reviewed at length with the patient today.  Concerns regarding medicines are outlined above.   Tests Ordered: Orders Placed This Encounter  Procedures   EKG 12-Lead    Medication Changes: No orders of the defined types were placed in this encounter.   Disposition:  Follow up in 1 year  Signed Denys Salinger Priya Darnita Woodrum, MD, 10/18/2024 10:40 AM    [facility] at [facility] [address] [place], [place], [place] [address]

## 2024-10-19 ENCOUNTER — Telehealth: Payer: Self-pay

## 2024-10-19 NOTE — Telephone Encounter (Signed)
 Alejandra Barr, Alejandra Barr [birth year]  ID: [id number] BOTOX  200 UNIT SDV PWD This order cannot be scheduled through the shipment scheduler, please chat with an agent.

## 2024-10-23 DIAGNOSIS — G8929 Other chronic pain: Secondary | ICD-10-CM | POA: Insufficient documentation

## 2024-10-25 ENCOUNTER — Ambulatory Visit: Admitting: Neurology

## 2024-10-30 ENCOUNTER — Ambulatory Visit: Admitting: Orthopedic Surgery

## 2024-10-31 ENCOUNTER — Other Ambulatory Visit (HOSPITAL_COMMUNITY): Payer: Self-pay

## 2024-11-16 ENCOUNTER — Ambulatory Visit: Admitting: Orthopedic Surgery

## 2024-11-17 ENCOUNTER — Ambulatory Visit: Admitting: Neurology

## 2024-11-23 ENCOUNTER — Ambulatory Visit: Admitting: Family Medicine

## 2024-11-23 VITALS — BP 117/76 | HR 88 | Resp 18 | Ht 61.0 in | Wt 183.0 lb

## 2024-11-23 DIAGNOSIS — F4329 Adjustment disorder with other symptoms: Secondary | ICD-10-CM | POA: Diagnosis not present

## 2024-11-23 DIAGNOSIS — M5441 Lumbago with sciatica, right side: Secondary | ICD-10-CM

## 2024-11-23 DIAGNOSIS — Z131 Encounter for screening for diabetes mellitus: Secondary | ICD-10-CM | POA: Diagnosis not present

## 2024-11-23 DIAGNOSIS — E66811 Obesity, class 1: Secondary | ICD-10-CM | POA: Diagnosis not present

## 2024-11-23 DIAGNOSIS — G8929 Other chronic pain: Secondary | ICD-10-CM | POA: Diagnosis not present

## 2024-11-23 DIAGNOSIS — E876 Hypokalemia: Secondary | ICD-10-CM | POA: Diagnosis not present

## 2024-11-23 DIAGNOSIS — E559 Vitamin D deficiency, unspecified: Secondary | ICD-10-CM

## 2024-11-23 DIAGNOSIS — E039 Hypothyroidism, unspecified: Secondary | ICD-10-CM

## 2024-11-23 DIAGNOSIS — D508 Other iron deficiency anemias: Secondary | ICD-10-CM | POA: Diagnosis not present

## 2024-11-23 DIAGNOSIS — Z13220 Encounter for screening for lipoid disorders: Secondary | ICD-10-CM

## 2024-11-23 DIAGNOSIS — M5442 Lumbago with sciatica, left side: Secondary | ICD-10-CM | POA: Diagnosis not present

## 2024-11-23 MED ORDER — DULOXETINE HCL 60 MG PO CPEP: 60.0000 mg | ORAL_CAPSULE | Freq: Every day | ORAL | 5 refills | Status: AC

## 2024-11-23 MED ORDER — LEVOTHYROXINE SODIUM 75 MCG PO TABS: 75.0000 ug | ORAL_TABLET | Freq: Every day | ORAL | 0 refills | Status: DC

## 2024-11-23 MED ORDER — VENLAFAXINE HCL ER 37.5 MG PO CP24: 37.5000 mg | ORAL_CAPSULE | Freq: Every day | ORAL | 5 refills | Status: AC

## 2024-11-23 NOTE — Patient Instructions (Signed)
 F/U IN 14 TO  16 weeks Start additional med for depression effexor ,   Labs today CBC, iron  and  ferritin, vit D, lipid, cmp and EGFR, hBA1C, tSH and free T4  I will start  phentermine  half tab daily once thyroid  lab is normal  Thankful that surgery has been successful  My sincere condolence  re loss of your spouse, prayers  Be careful not to fall  Thanks for choosing [facility], we consider it a privelige to serve you.

## 2024-11-24 ENCOUNTER — Ambulatory Visit: Payer: Self-pay | Admitting: Family Medicine

## 2024-11-24 ENCOUNTER — Encounter: Payer: Self-pay | Admitting: Family Medicine

## 2024-11-24 LAB — CBC WITH DIFFERENTIAL/PLATELET
Basophils Absolute: 0.1 x10E3/uL (ref 0.0–0.2)
Basos: 1 %
EOS (ABSOLUTE): 1.4 x10E3/uL — ABNORMAL HIGH (ref 0.0–0.4)
Eos: 19 %
Hematocrit: 34.4 % (ref 34.0–46.6)
Hemoglobin: 11.3 g/dL (ref 11.1–15.9)
Immature Grans (Abs): 0.1 x10E3/uL (ref 0.0–0.1)
Immature Granulocytes: 1 %
Lymphocytes Absolute: 1.7 x10E3/uL (ref 0.7–3.1)
Lymphs: 23 %
MCH: 28.8 pg (ref 26.6–33.0)
MCHC: 32.8 g/dL (ref 31.5–35.7)
MCV: 88 fL (ref 79–97)
Monocytes Absolute: 0.4 x10E3/uL (ref 0.1–0.9)
Monocytes: 6 %
Neutrophils Absolute: 3.7 x10E3/uL (ref 1.4–7.0)
Neutrophils: 50 %
Platelets: 308 x10E3/uL (ref 150–450)
RBC: 3.92 x10E6/uL (ref 3.77–5.28)
RDW: 12.7 % (ref 11.7–15.4)
WBC: 7.3 x10E3/uL (ref 3.4–10.8)

## 2024-11-24 LAB — LIPID PANEL
Chol/HDL Ratio: 2.9 ratio (ref 0.0–4.4)
Cholesterol, Total: 170 mg/dL (ref 100–199)
HDL: 58 mg/dL
LDL Chol Calc (NIH): 96 mg/dL (ref 0–99)
Triglycerides: 86 mg/dL (ref 0–149)
VLDL Cholesterol Cal: 16 mg/dL (ref 5–40)

## 2024-11-24 LAB — CMP14+EGFR
ALT: 12 IU/L (ref 0–32)
AST: 15 IU/L (ref 0–40)
Albumin: 3.8 g/dL — ABNORMAL LOW (ref 3.9–4.9)
Alkaline Phosphatase: 181 IU/L — ABNORMAL HIGH (ref 41–116)
BUN/Creatinine Ratio: 25 — ABNORMAL HIGH (ref 9–23)
BUN: 16 mg/dL (ref 6–20)
Bilirubin Total: <0.2 mg/dL (ref 0.0–1.2)
CO2: 20 mmol/L (ref 20–29)
Calcium: 9 mg/dL (ref 8.7–10.2)
Chloride: 106 mmol/L (ref 96–106)
Creatinine, Ser: 0.63 mg/dL (ref 0.57–1.00)
Globulin, Total: 2.4 g/dL (ref 1.5–4.5)
Glucose: 100 mg/dL — ABNORMAL HIGH (ref 70–99)
Potassium: 4.5 mmol/L (ref 3.5–5.2)
Sodium: 141 mmol/L (ref 134–144)
Total Protein: 6.2 g/dL (ref 6.0–8.5)
eGFR: 116 mL/min/1.73

## 2024-11-24 LAB — FERRITIN: Ferritin: 93 ng/mL (ref 15–150)

## 2024-11-24 LAB — TSH+FREE T4
Free T4: 0.75 ng/dL — ABNORMAL LOW (ref 0.82–1.77)
TSH: 1.89 u[IU]/mL (ref 0.450–4.500)

## 2024-11-24 LAB — HEMOGLOBIN A1C
Est. average glucose Bld gHb Est-mCnc: 88 mg/dL
Hgb A1c MFr Bld: 4.7 % — ABNORMAL LOW (ref 4.8–5.6)

## 2024-11-24 LAB — VITAMIN D 25 HYDROXY (VIT D DEFICIENCY, FRACTURES): Vit D, 25-Hydroxy: 29 ng/mL — ABNORMAL LOW (ref 30.0–100.0)

## 2024-11-24 LAB — IRON: Iron: 47 ug/dL (ref 27–159)

## 2024-11-24 MED ORDER — LEVOTHYROXINE SODIUM 88 MCG PO TABS: 88.0000 ug | ORAL_TABLET | Freq: Every day | ORAL | 1 refills | Status: AC

## 2024-11-24 NOTE — Telephone Encounter (Signed)
 Patient called to follow up on message sent via MyChart.

## 2024-11-25 ENCOUNTER — Other Ambulatory Visit: Payer: Self-pay | Admitting: Family Medicine

## 2024-11-25 DIAGNOSIS — E559 Vitamin D deficiency, unspecified: Secondary | ICD-10-CM

## 2024-11-25 MED ORDER — PHENTERMINE HCL 37.5 MG PO TABS: 37.5000 mg | ORAL_TABLET | Freq: Every day | ORAL | 2 refills | Status: DC

## 2024-11-25 MED ORDER — VITAMIN D 50 MCG (2000 UT) PO CAPS: 1.0000 | ORAL_CAPSULE | Freq: Every day | ORAL | 3 refills | Status: AC

## 2024-11-27 ENCOUNTER — Encounter: Payer: Self-pay | Admitting: Family Medicine

## 2024-11-27 DIAGNOSIS — Z13220 Encounter for screening for lipoid disorders: Secondary | ICD-10-CM | POA: Insufficient documentation

## 2024-11-27 DIAGNOSIS — E66811 Obesity, class 1: Secondary | ICD-10-CM | POA: Insufficient documentation

## 2024-11-27 DIAGNOSIS — Z131 Encounter for screening for diabetes mellitus: Secondary | ICD-10-CM | POA: Insufficient documentation

## 2024-11-27 MED ORDER — PHENTERMINE HCL 37.5 MG PO TABS: ORAL_TABLET | ORAL | 1 refills | Status: AC

## 2024-11-27 NOTE — Assessment & Plan Note (Signed)
 Increase med dose as free T4 slightly decreased an dhse has weight gain

## 2024-11-27 NOTE — Assessment & Plan Note (Signed)
 Currently grieving the loss of her spouse to cancer , while recovering from lumbar surgery approx 3 weeks afo, will start therapy for mental health in the near future, has been over eating to compensate with significant weight gain

## 2024-11-27 NOTE — Assessment & Plan Note (Signed)
 Hyperlipidemia:Low fat diet discussed and encouraged.   Lipid Panel  Lab Results  Component Value Date   CHOL 170 11/23/2024   HDL 58 11/23/2024   LDLCALC 96 11/23/2024   TRIG 86 11/23/2024   CHOLHDL 2.9 11/23/2024     Excellent profile

## 2024-11-27 NOTE — Assessment & Plan Note (Signed)
 HBA1C is low, not a diabetic

## 2024-11-27 NOTE — Assessment & Plan Note (Signed)
 Ongoing pain , additional spine surgery proposed in an effort to alleviate pain and improve quality of life

## 2024-11-27 NOTE — Assessment & Plan Note (Signed)
" °  Patient re-educated about  the importance of commitment to a  minimum of 150 minutes of exercise per week as able.  The importance of healthy food choices with portion control discussed, as well as eating regularly and within a 12 hour window most days. The need to choose clean , green food 50 to 75% of the time is discussed, as well as to make water the primary drink and set a goal of 64 ounces water daily.       11/23/2024    1:18 PM 10/18/2024    1:54 PM 10/18/2024   10:36 AM  Weight /BMI  Weight 183 lb 0.6 oz 176 lb 3.2 oz 175 lb 6.4 oz  Height 5' 1 (1.549 m) 5' 1 (1.549 m) 5' 1 (1.549 m)  BMI 34.59 kg/m2 33.29 kg/m2 33.14 kg/m2    Deteriorated, will resume phentermine  half tab daily ( corrected script sent) "

## 2024-11-27 NOTE — Progress Notes (Signed)
 "  Alejandra Barr     MRN: [id number]      DOB: 11-09-1985  Chief Complaint  Patient presents with   Hypothyroidism    Follow up    Obesity    Pt concerned about weight gain, would like to get back on phentermine      HPI Alejandra Barr is here for follow up and re-evaluation of chronic medical conditions, medication management and review of any available recent lab and radiology data.  Preventive health is updated, specifically  Cancer screening and Immunization.   Had successful spine surgery at [facility] and will likely have 2 additional spine surgeries based on her reporting. Hospitalized from 12/29 to 11/07/2024 and had posterior spinal fusion T9 to L2 on 12/29, post op course essentially uneventful Unfortunately she lost her spouse to cancer approximately 2 weeks before her surgery, she and her 4 young children are temporarily living with her parents during this very difficult time Still waiting on disability, still not approved though she is medically incapable of employment at this time C/o excessive weight gain, a lot of  comfort eating wants to resume phentermine , which she can tolerate , to help with weight loss  ROS Denies recent fever or chills. Denies sinus pressure, nasal congestion, ear pain or sore throat. Denies chest congestion, productive cough or wheezing. Denies chest pains, palpitations and leg swelling Denies abdominal pain, nausea, vomiting,diarrhea or constipation.   Denies dysuria, frequency, hesitancy or incontinence. C/o chronic back pain  and limitation in mobility. Denies  seizures, c/o lower extremity numbness, and tingling. C/o depression and anxiety has appointment upcoming with therapy Denies skin break down or rash.   PE  BP 117/76   Pulse 88   Resp 18   Ht 5' 1 (1.549 m)   Wt 183 lb 0.6 oz (83 kg)   SpO2 96%   BMI 34.59 kg/m   Patient alert and oriented and in no cardiopulmonary distress.  HEENT: No facial asymmetry, EOMI,     Neck supple  .  Chest: Clear to auscultation bilaterally.  CVS: S1, S2 no murmurs, no S3.Regular rate.  ABD: Soft non tender.   Ext: No edema  FD:Izrmzjdzi  ROM spine,  h Skin: dry clean bandage over op site , lower back Psych: Good eye contact, normal affect. Memory intact mildly  anxious and  depressed appearing.  CNS: CN 2-12 intact, power,  normal throughout.no focal deficits noted.   Assessment & Plan  Stress and adjustment reaction Currently grieving the loss of her spouse to cancer , while recovering from lumbar surgery approx 3 weeks afo, will start therapy for mental health in the near future, has been over eating to compensate with significant weight gain  Vitamin D  deficiency Needs to add daily vit D3 , 2000 units  Hypothyroidism Increase med dose as free T4 slightly decreased an dhse has weight gain  Chronic bilateral low back pain with bilateral sciatica Ongoing pain , additional spine surgery proposed in an effort to alleviate pain and improve quality of life  Iron  deficiency anemia Updated lab needed , anemia corrected  Screening for lipoid disorders Hyperlipidemia:Low fat diet discussed and encouraged.   Lipid Panel  Lab Results  Component Value Date   CHOL 170 11/23/2024   HDL 58 11/23/2024   LDLCALC 96 11/23/2024   TRIG 86 11/23/2024   CHOLHDL 2.9 11/23/2024     Excellent profile  Screening for diabetes mellitus HBA1C is low, not a diabetic  Obesity (BMI 30.0-34.9)  Patient  re-educated about  the importance of commitment to a  minimum of 150 minutes of exercise per week as able.  The importance of healthy food choices with portion control discussed, as well as eating regularly and within a 12 hour window most days. The need to choose clean , green food 50 to 75% of the time is discussed, as well as to make water the primary drink and set a goal of 64 ounces water daily.       11/23/2024    1:18 PM 10/18/2024    1:54 PM 10/18/2024   10:36 AM   Weight /BMI  Weight 183 lb 0.6 oz 176 lb 3.2 oz 175 lb 6.4 oz  Height 5' 1 (1.549 m) 5' 1 (1.549 m) 5' 1 (1.549 m)  BMI 34.59 kg/m2 33.29 kg/m2 33.14 kg/m2    Deteriorated, will resume phentermine  half tab daily ( corrected script sent)  "

## 2024-11-27 NOTE — Assessment & Plan Note (Signed)
 Updated lab needed , anemia corrected

## 2024-11-27 NOTE — Assessment & Plan Note (Signed)
 Needs to add daily vit D3 , 2000 units

## 2024-12-07 ENCOUNTER — Ambulatory Visit: Admitting: Orthopedic Surgery

## 2024-12-07 ENCOUNTER — Other Ambulatory Visit: Payer: Self-pay

## 2024-12-07 DIAGNOSIS — M1711 Unilateral primary osteoarthritis, right knee: Secondary | ICD-10-CM

## 2024-12-07 MED ORDER — METHYLPREDNISOLONE ACETATE 40 MG/ML IJ SUSP
40.0000 mg | Freq: Once | INTRAMUSCULAR | Status: AC
  Administered 2024-12-07: 40 mg via INTRA_ARTICULAR

## 2024-12-07 NOTE — Addendum Note (Signed)
 Addended by: VICENTA EMMIE HERO on: 12/07/2024 04:46 PM   Modules accepted: Orders

## 2024-12-07 NOTE — Progress Notes (Signed)
 FOLLOW-UP OFFICE VISIT   Patient: Alejandra Barr           Date of Birth: 1986/08/02           MRN: [id number] Visit Date: 12/07/2024 Requested by: Antonetta Rollene BRAVO, MD [address], [address] [place],  [place] [address] PCP: Antonetta Rollene BRAVO, MD    Encounter Diagnosis  Name Primary?   Primary osteoarthritis of right knee Yes    Chief Complaint  Patient presents with   Knee Pain    R, still has giving way. Would like to try an injection if it's still an option.     39 year old female with MRI noted to be positive for small nondisplaced tear superior articular surface of the lateral meniscus mucoid degeneration no tear ACL moderate to high-grade thinning of the mid to medial aspect of the lateral tibial plateau with subchondral marrow edema moderate thinning of the medial femoral condyle mild to moderate joint effusion focal edema in the fat pad  Patient has had a gastric bypass cannot take NSAIDs so she is basically limited to Tylenol  and diclofenac  Voltaren  gel which she has done  She has also had knee injection ???  She also wears a brace  Review of systems patient has a spinal cord stimulator has a lot of issues with her spine which are contributing to the instability symptoms noted in her knee  Takes Dilaudid  for chronic pain  Knee Pain    Review of systems patient recently had thoracic fusion to L2 scheduled for lumbar L2-5 fusion I believe  In any event her main complaints are : Stiffness Giving way Medial and lateral knee pain Poor function for ADLs  ASSESSMENT AND PLAN  39 year old female with arthritic changes on imaging without ligament damage Recently lost her husband of 17 years to cancer she has several children and 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  After cleaning the skin with alcohol a 20-gauge needle was used to inject the right knee joint. There were no complications. A sterile bandage was applied.

## 2024-12-07 NOTE — Progress Notes (Signed)
" ° ° °  12/07/2024   Chief Complaint  Patient presents with   Knee Pain    R, still has giving way. Would like to try an injection if it's still an option.     No diagnosis found.  What pharmacy do you use ? [facility]  DOI/DOS/ Date:    Did you get better, worse or no change (Answer below)   Unchanged      "

## 2024-12-15 ENCOUNTER — Ambulatory Visit: Admitting: Neurology

## 2025-01-04 ENCOUNTER — Ambulatory Visit: Admitting: Orthopedic Surgery

## 2025-03-14 ENCOUNTER — Ambulatory Visit: Payer: Self-pay | Admitting: Family Medicine

## 2025-10-18 ENCOUNTER — Ambulatory Visit: Payer: Self-pay | Admitting: Neurology
